# Patient Record
Sex: Female | Born: 1950
Health system: Southern US, Community
[De-identification: ages and names within clinical notes are randomized; demographics above are authoritative.]

## PROBLEM LIST (undated history)

## (undated) DIAGNOSIS — M151 Heberden's nodes (with arthropathy): Secondary | ICD-10-CM

## (undated) DIAGNOSIS — M1990 Unspecified osteoarthritis, unspecified site: Secondary | ICD-10-CM

## (undated) DIAGNOSIS — Z974 Presence of external hearing-aid: Secondary | ICD-10-CM

## (undated) DIAGNOSIS — E785 Hyperlipidemia, unspecified: Secondary | ICD-10-CM

## (undated) DIAGNOSIS — K219 Gastro-esophageal reflux disease without esophagitis: Secondary | ICD-10-CM

## (undated) DIAGNOSIS — H3530 Unspecified macular degeneration: Secondary | ICD-10-CM

## (undated) DIAGNOSIS — R112 Nausea with vomiting, unspecified: Secondary | ICD-10-CM

## (undated) DIAGNOSIS — T7840XA Allergy, unspecified, initial encounter: Secondary | ICD-10-CM

## (undated) DIAGNOSIS — M51369 Other intervertebral disc degeneration, lumbar region without mention of lumbar back pain or lower extremity pain: Secondary | ICD-10-CM

## (undated) DIAGNOSIS — M810 Age-related osteoporosis without current pathological fracture: Secondary | ICD-10-CM

## (undated) DIAGNOSIS — I10 Essential (primary) hypertension: Secondary | ICD-10-CM

## (undated) DIAGNOSIS — Z85828 Personal history of other malignant neoplasm of skin: Secondary | ICD-10-CM

## (undated) DIAGNOSIS — I671 Cerebral aneurysm, nonruptured: Secondary | ICD-10-CM

## (undated) DIAGNOSIS — Z9889 Other specified postprocedural states: Secondary | ICD-10-CM

## (undated) DIAGNOSIS — G510 Bell's palsy: Secondary | ICD-10-CM

## (undated) DIAGNOSIS — K52832 Lymphocytic colitis: Secondary | ICD-10-CM

## (undated) DIAGNOSIS — M5030 Other cervical disc degeneration, unspecified cervical region: Secondary | ICD-10-CM

## (undated) DIAGNOSIS — M479 Spondylosis, unspecified: Secondary | ICD-10-CM

## (undated) DIAGNOSIS — F32A Depression, unspecified: Secondary | ICD-10-CM

## (undated) DIAGNOSIS — J439 Emphysema, unspecified: Secondary | ICD-10-CM

## (undated) DIAGNOSIS — J189 Pneumonia, unspecified organism: Secondary | ICD-10-CM

## (undated) DIAGNOSIS — J449 Chronic obstructive pulmonary disease, unspecified: Secondary | ICD-10-CM

## (undated) DIAGNOSIS — F419 Anxiety disorder, unspecified: Secondary | ICD-10-CM

## (undated) DIAGNOSIS — M5136 Other intervertebral disc degeneration, lumbar region: Secondary | ICD-10-CM

## (undated) HISTORY — DX: Gastro-esophageal reflux disease without esophagitis: K21.9

## (undated) HISTORY — DX: Bell's palsy: G51.0

## (undated) HISTORY — DX: Age-related osteoporosis without current pathological fracture: M81.0

## (undated) HISTORY — DX: Essential (primary) hypertension: I10

## (undated) HISTORY — PX: CEREBRAL ANEURYSM REPAIR: SHX164

## (undated) HISTORY — DX: Depression, unspecified: F32.A

## (undated) HISTORY — DX: Emphysema, unspecified: J43.9

## (undated) HISTORY — PX: EYE SURGERY: SHX253

## (undated) HISTORY — PX: COSMETIC SURGERY: SHX468

## (undated) HISTORY — PX: TUBAL LIGATION: SHX77

## (undated) HISTORY — PX: MASTOIDECTOMY: SHX711

## (undated) HISTORY — PX: WRIST GANGLION EXCISION: SUR520

## (undated) HISTORY — PX: HAND SURGERY: SHX662

## (undated) HISTORY — DX: Other cervical disc degeneration, unspecified cervical region: M50.30

## (undated) HISTORY — DX: Other intervertebral disc degeneration, lumbar region without mention of lumbar back pain or lower extremity pain: M51.369

## (undated) HISTORY — PX: CARPAL TUNNEL RELEASE: SHX101

## (undated) HISTORY — PX: ABDOMINAL HYSTERECTOMY: SHX81

## (undated) HISTORY — DX: Hyperlipidemia, unspecified: E78.5

## (undated) HISTORY — PX: FRACTURE SURGERY: SHX138

## (undated) HISTORY — DX: Allergy, unspecified, initial encounter: T78.40XA

## (undated) HISTORY — DX: Other intervertebral disc degeneration, lumbar region: M51.36

## (undated) HISTORY — PX: SPINE SURGERY: SHX786

## (undated) HISTORY — PX: TRIGGER FINGER RELEASE: SHX641

## (undated) HISTORY — DX: Unspecified osteoarthritis, unspecified site: M19.90

## (undated) HISTORY — PX: FOOT SURGERY: SHX648

## (undated) HISTORY — DX: Cerebral aneurysm, nonruptured: I67.1

## (undated) HISTORY — DX: Anxiety disorder, unspecified: F41.9

---
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
## 1972-12-05 DIAGNOSIS — M952 Other acquired deformity of head: Secondary | ICD-10-CM | POA: Insufficient documentation

## 1972-12-05 DIAGNOSIS — Z8669 Personal history of other diseases of the nervous system and sense organs: Secondary | ICD-10-CM

## 1972-12-05 HISTORY — PX: MASTOIDECTOMY: SHX711

## 1972-12-05 HISTORY — DX: Other acquired deformity of head: M95.2

## 1972-12-05 HISTORY — DX: Personal history of other diseases of the nervous system and sense organs: Z86.69

## 1985-12-05 HISTORY — PX: TOTAL ABDOMINAL HYSTERECTOMY: SHX209

## 1993-12-05 HISTORY — PX: SALPINGOOPHORECTOMY: SHX82

## 2008-12-05 DIAGNOSIS — Z8679 Personal history of other diseases of the circulatory system: Secondary | ICD-10-CM

## 2008-12-05 DIAGNOSIS — Z8619 Personal history of other infectious and parasitic diseases: Secondary | ICD-10-CM

## 2008-12-05 HISTORY — PX: CEREBRAL ANEURYSM REPAIR: SHX164

## 2008-12-05 HISTORY — DX: Personal history of other diseases of the circulatory system: Z86.79

## 2008-12-05 HISTORY — DX: Personal history of other infectious and parasitic diseases: Z86.19

## 2012-12-05 HISTORY — PX: CATARACT EXTRACTION W/ INTRAOCULAR LENS IMPLANT: SHX1309

## 2012-12-05 HISTORY — PX: ROTATOR CUFF REPAIR: SHX139

## 2017-01-17 DIAGNOSIS — Z23 Encounter for immunization: Secondary | ICD-10-CM | POA: Diagnosis not present

## 2017-03-20 ENCOUNTER — Emergency Department (INDEPENDENT_AMBULATORY_CARE_PROVIDER_SITE_OTHER)
Admission: EM | Admit: 2017-03-20 | Discharge: 2017-03-20 | Disposition: A | Discharge status: Home / Self Care | Payer: Medicare Other | Source: Home / Self Care | Attending: Family Medicine | Admitting: Family Medicine

## 2017-03-20 DIAGNOSIS — J029 Acute pharyngitis, unspecified: Secondary | ICD-10-CM

## 2017-03-20 LAB — POCT RAPID STREP A (OFFICE): Rapid Strep A Screen: NEGATIVE

## 2017-03-20 MED ORDER — PENICILLIN V POTASSIUM 500 MG PO TABS: ORAL_TABLET | ORAL | 0 refills | Status: DC

## 2017-03-20 NOTE — ED Provider Notes (Signed)
[name]    CSN: [id number] Arrival date & time: 03/20/17  1121     History   Chief Complaint Chief Complaint  Patient presents with  . Sore Throat  . Otalgia    HPI Krista [name] is a 66 y.o. female.   Patient complains of persistent sore throat for 2.5 days, and notes that her young grandson has strep pharyngitis.  She has been fatigued with myalgias, tenderness over her sinuses, and mild cough.  No fever but has had chills.   The history is provided by the patient.    History reviewed. No pertinent past medical history.  There are no active problems to display for this patient.   Past Surgical History:  Procedure Laterality Date  . ABDOMINAL HYSTERECTOMY    . CEREBRAL ANEURYSM REPAIR    . EYE SURGERY     cararact  . FOOT SURGERY    . HAND SURGERY      OB History    No data available       Home Medications    Prior to Admission medications   Medication Sig Start Date End Date Taking? Authorizing Provider  penicillin v potassium (VEETID) 500 MG tablet Take one tab by mouth twice daily for 10 days (Rx void after 03/27/17) 03/20/17   Kandra Nicolas, MD    Family History Family History  Problem Relation Age of Onset  . Heart failure Mother   . Cancer Father     Social History Social History  Substance Use Topics  . Smoking status: Former Research scientist (life sciences)  . Smokeless tobacco: Not on file  . Alcohol use No     Allergies   Codeine   Review of Systems Review of Systems  + sore throat + cough No pleuritic pain No wheezing + nasal congestion ? post-nasal drainage + sinus pain/pressure No itchy/red eyes ? earache No hemoptysis No SOB No fever, + chills No nausea No vomiting No abdominal pain No diarrhea, but loose stools No urinary symptoms No skin rash + fatigue + myalgias + headache     Physical Exam Triage Vital Signs ED Triage Vitals [03/20/17 1157]  Enc Vitals Group     BP (!) 162/87     Pulse Rate 76     Resp       Temp 98.4 F (36.9 C)     Temp Source Oral     SpO2 95 %     Weight 125 lb (56.7 kg)     Height 5\' 5"  (1.651 m)     Head Circumference      Peak Flow      Pain Score 7     Pain Loc      Pain Edu?      Excl. in [place]?    No data found.   Updated Vital Signs BP (!) 162/87 (BP Location: Left Arm)   Pulse 76   Temp 98.4 F (36.9 C) (Oral)   Ht 5\' 5"  (1.651 m)   Wt 125 lb (56.7 kg)   SpO2 95%   BMI 20.80 kg/m   Visual Acuity Right Eye Distance:   Left Eye Distance:   Bilateral Distance:    Right Eye Near:   Left Eye Near:    Bilateral Near:     Physical Exam Nursing notes and Vital Signs reviewed. Appearance:  Patient appears stated age, and in no acute distress Eyes:  Pupils are equal, round, and reactive to light and accomodation.  Extraocular movement is  intact.  Conjunctivae are not inflamed  Ears:  Canals normal.  Tympanic membranes normal.  Nose:  Mildly congested turbinates.  No sinus tenderness.    Pharynx:  Small aphthous ulcer above uvula. Neck:  Supple.  Tender enlarged posterior/lateral nodes are palpated bilaterally  Lungs:  Clear to auscultation.  Breath sounds are equal.  Moving air well. Heart:  Regular rate and rhythm without murmurs, rubs, or gallops.  Abdomen:  Nontender without masses or hepatosplenomegaly.  Bowel sounds are present.  No CVA or flank tenderness.  Extremities:  No edema.  Skin:  No rash present.    UC Treatments / Results  Labs (all labs ordered are listed, but only abnormal results are displayed) Labs Reviewed  STREP A DNA PROBE negative  POCT RAPID STREP A (OFFICE)    EKG  EKG Interpretation None       Radiology No results found.  Procedures Procedures (including critical care time)  Medications Ordered in UC Medications - No data to display   Initial Impression / Assessment and Plan / UC Course  I have reviewed the triage vital signs and the nursing notes.  Pertinent labs & imaging results that were  available during my care of the patient were reviewed by me and considered in my medical decision making (see chart for details).    There is no evidence of bacterial infection today.   Suspect early viral URI Throat culture pending. As cold symptoms develop, try the following: Take plain guaifenesin (1200mg  extended release tabs such as Mucinex) twice daily, with plenty of water, for cough and congestion.  May add Pseudoephedrine (30mg , one or two every 4 to 6 hours) for sinus congestion.  Get adequate rest.   Also recommend using saline nasal spray several times daily and saline nasal irrigation (AYR is a common brand).    Try warm salt water gargles for sore throat.  May take Delsym Cough Suppressant at bedtime for nighttime cough.  Stop all antihistamines for now, and other non-prescription cough/cold preparations. May take Ibuprofen 200mg , 4 tabs every 8 hours with food for sore throat, body aches, etc. Begin penicillin if throat culture is positive (given Rx to hold). Follow-up with family doctor if not improving about10 days.     Final Clinical Impressions(s) / UC Diagnoses   Final diagnoses:  Acute pharyngitis, unspecified etiology    New Prescriptions New Prescriptions   PENICILLIN V POTASSIUM (VEETID) 500 MG TABLET    Take one tab by mouth twice daily for 10 days (Rx void after 03/27/17)     Kandra Nicolas, MD 04/05/17 [phone]

## 2017-03-20 NOTE — ED Triage Notes (Signed)
Sore throat and chills started Saturday.  Ears are now hurting.  Grandson DX with strep over the weekend.

## 2017-03-20 NOTE — Discharge Instructions (Signed)
As cold symptoms develop, try the following: Take plain guaifenesin (1200mg  extended release tabs such as Mucinex) twice daily, with plenty of water, for cough and congestion.  May add Pseudoephedrine (30mg , one or two every 4 to 6 hours) for sinus congestion.  Get adequate rest.   Also recommend using saline nasal spray several times daily and saline nasal irrigation (AYR is a common brand).    Try warm salt water gargles for sore throat.  May take Delsym Cough Suppressant at bedtime for nighttime cough.  Stop all antihistamines for now, and other non-prescription cough/cold preparations. May take Ibuprofen 200mg , 4 tabs every 8 hours with food for sore throat, body aches, etc. Begin penicillin if throat culture is positive   Follow-up with family doctor if not improving about10 days.

## 2017-03-21 ENCOUNTER — Telehealth: Payer: Self-pay | Admitting: *Deleted

## 2017-03-21 LAB — STREP A DNA PROBE: GASP: NOT DETECTED

## 2017-03-21 NOTE — Telephone Encounter (Signed)
Call back: spoke to pt given negative Tcx results. Charna Archer, LPN

## 2017-04-17 ENCOUNTER — Encounter: Payer: Self-pay | Admitting: Emergency Medicine

## 2017-04-17 ENCOUNTER — Emergency Department (INDEPENDENT_AMBULATORY_CARE_PROVIDER_SITE_OTHER)
Admission: EM | Admit: 2017-04-17 | Discharge: 2017-04-17 | Disposition: A | Discharge status: Home / Self Care | Payer: Medicare Other | Source: Home / Self Care | Attending: Family Medicine | Admitting: Family Medicine

## 2017-04-17 DIAGNOSIS — N3090 Cystitis, unspecified without hematuria: Secondary | ICD-10-CM

## 2017-04-17 DIAGNOSIS — R300 Dysuria: Secondary | ICD-10-CM

## 2017-04-17 LAB — POCT URINALYSIS DIP (MANUAL ENTRY)
Bilirubin, UA: NEGATIVE
Glucose, UA: NEGATIVE mg/dL
Ketones, POC UA: NEGATIVE mg/dL
Nitrite, UA: NEGATIVE
Protein Ur, POC: NEGATIVE mg/dL
Spec Grav, UA: <=1.005 — AB (ref 1.010–1.025)
Urobilinogen, UA: 0.2 E.U./dL
pH, UA: 6.5 (ref 5.0–8.0)

## 2017-04-17 MED ORDER — CEPHALEXIN 500 MG PO CAPS: 500.0000 mg | ORAL_CAPSULE | Freq: Two times a day (BID) | ORAL | 0 refills | Status: DC

## 2017-04-17 NOTE — ED Provider Notes (Signed)
CSN: [id number]     Arrival date & time 04/17/17  1300 History   First MD Initiated Contact with Patient 04/17/17 1325     Chief Complaint  Patient presents with  . Dysuria   (Consider location/radiation/quality/duration/timing/severity/associated sxs/prior Treatment) HPI Krista [name] is a 66 y.o. female presenting to UC with c/o dysuria and polyuria for about 5 days.  Hx of similar symptoms with a UTI. Last UTI was several years ago. Mild lower abdominal soreness and back soreness. Denies fever, chills, n/v/d. She did take Azo earlier today with moderate relief.    History reviewed. No pertinent past medical history. Past Surgical History:  Procedure Laterality Date  . ABDOMINAL HYSTERECTOMY    . CEREBRAL ANEURYSM REPAIR    . EYE SURGERY     cararact  . FOOT SURGERY    . HAND SURGERY     Family History  Problem Relation Age of Onset  . Heart failure Mother   . Cancer Father    Social History  Substance Use Topics  . Smoking status: Former Research scientist (life sciences)  . Smokeless tobacco: Never Used  . Alcohol use No   OB History    No data available     Review of Systems  Constitutional: Negative for chills and fever.  Gastrointestinal: Positive for abdominal pain. Negative for diarrhea, nausea and vomiting.  Genitourinary: Positive for dysuria, frequency and urgency. Negative for pelvic pain.  Musculoskeletal: Positive for back pain. Negative for arthralgias and myalgias.  Skin: Negative for rash.    Allergies  Codeine  Home Medications   Prior to Admission medications   Medication Sig Start Date End Date Taking? Authorizing Provider  cephALEXin (KEFLEX) 500 MG capsule Take 1 capsule (500 mg total) by mouth 2 (two) times daily. 04/17/17   Noland Fordyce, PA-C   Meds Ordered and Administered this Visit  Medications - No data to display  BP (!) 155/81 (BP Location: Left Arm)   Pulse 69   Temp 98.5 F (36.9 C) (Oral)   Ht 5\' 5"  (1.651 m)   Wt 124 lb (56.2 kg)   SpO2 98%    BMI 20.63 kg/m  No data found.   Physical Exam  Constitutional: She is oriented to person, place, and time. She appears well-developed and well-nourished. No distress.  HENT:  Head: Normocephalic and atraumatic.  Mouth/Throat: Oropharynx is clear and moist.  Eyes: EOM are normal.  Neck: Normal range of motion.  Cardiovascular: Normal rate and regular rhythm.   Pulmonary/Chest: Effort normal and breath sounds normal. No respiratory distress. She has no wheezes. She has no rales.  Abdominal: Soft. She exhibits no distension and no mass. There is no tenderness. There is no guarding and no CVA tenderness.  Musculoskeletal: Normal range of motion.  Neurological: She is alert and oriented to person, place, and time.  Skin: Skin is warm and dry. She is not diaphoretic.  Psychiatric: She has a normal mood and affect. Her behavior is normal.  Nursing note and vitals reviewed.   Urgent Care Course     Procedures (including critical care time)  Labs Review Labs Reviewed  POCT URINALYSIS DIP (MANUAL ENTRY) - Abnormal; Notable for the following:       Result Value   Clarity, UA cloudy (*)    Spec Grav, UA <=1.005 (*)    Blood, UA small (*)    Leukocytes, UA Moderate (2+) (*)    All other components within normal limits  URINE CULTURE    Imaging Review No  results found.    MDM   1. Dysuria   2. Cystitis    Hx and exam c/w likely early UTI Will start pt on Keflex while culture is pending  Encouraged good hydration f/u with PCP as needed.    Noland Fordyce, PA-C 04/17/17 [phone]

## 2017-04-17 NOTE — ED Triage Notes (Signed)
Dysuria, polyuria x 5 days

## 2017-04-20 ENCOUNTER — Telehealth: Payer: Self-pay | Admitting: Emergency Medicine

## 2017-04-20 LAB — URINE CULTURE

## 2017-04-20 NOTE — Telephone Encounter (Signed)
Patient states her UTI symptoms are improving; gave her culture results which indicate her rx is appropriate.

## 2017-08-15 ENCOUNTER — Ambulatory Visit (INDEPENDENT_AMBULATORY_CARE_PROVIDER_SITE_OTHER): Payer: Medicare Other | Admitting: Osteopathic Medicine

## 2017-08-15 ENCOUNTER — Encounter: Payer: Self-pay | Admitting: Osteopathic Medicine

## 2017-08-15 VITALS — BP 155/61 | HR 67 | Ht 65.0 in | Wt 129.0 lb

## 2017-08-15 DIAGNOSIS — I671 Cerebral aneurysm, nonruptured: Secondary | ICD-10-CM

## 2017-08-15 DIAGNOSIS — R197 Diarrhea, unspecified: Secondary | ICD-10-CM | POA: Diagnosis not present

## 2017-08-15 DIAGNOSIS — H9193 Unspecified hearing loss, bilateral: Secondary | ICD-10-CM | POA: Insufficient documentation

## 2017-08-15 DIAGNOSIS — Z8669 Personal history of other diseases of the nervous system and sense organs: Secondary | ICD-10-CM

## 2017-08-15 DIAGNOSIS — Z85828 Personal history of other malignant neoplasm of skin: Secondary | ICD-10-CM | POA: Insufficient documentation

## 2017-08-15 DIAGNOSIS — Z1231 Encounter for screening mammogram for malignant neoplasm of breast: Secondary | ICD-10-CM

## 2017-08-15 DIAGNOSIS — Z90710 Acquired absence of both cervix and uterus: Secondary | ICD-10-CM

## 2017-08-15 DIAGNOSIS — Z860100 Personal history of colon polyps, unspecified: Secondary | ICD-10-CM | POA: Insufficient documentation

## 2017-08-15 DIAGNOSIS — Z23 Encounter for immunization: Secondary | ICD-10-CM | POA: Diagnosis not present

## 2017-08-15 DIAGNOSIS — Z8739 Personal history of other diseases of the musculoskeletal system and connective tissue: Secondary | ICD-10-CM

## 2017-08-15 DIAGNOSIS — Z1239 Encounter for other screening for malignant neoplasm of breast: Secondary | ICD-10-CM

## 2017-08-15 DIAGNOSIS — Z0000 Encounter for general adult medical examination without abnormal findings: Secondary | ICD-10-CM | POA: Diagnosis not present

## 2017-08-15 HISTORY — DX: Unspecified hearing loss, bilateral: H91.93

## 2017-08-15 HISTORY — DX: Personal history of other malignant neoplasm of skin: Z85.828

## 2017-08-15 HISTORY — DX: Personal history of other diseases of the nervous system and sense organs: Z86.69

## 2017-08-15 HISTORY — DX: Personal history of colon polyps, unspecified: Z86.0100

## 2017-08-15 HISTORY — DX: Cerebral aneurysm, nonruptured: I67.1

## 2017-08-15 HISTORY — DX: Acquired absence of both cervix and uterus: Z90.710

## 2017-08-15 HISTORY — DX: Personal history of other diseases of the musculoskeletal system and connective tissue: Z87.39

## 2017-08-15 MED ORDER — HYDROCHLOROTHIAZIDE 12.5 MG PO TABS: 12.5000 mg | ORAL_TABLET | Freq: Every day | ORAL | 1 refills | Status: DC

## 2017-08-15 NOTE — Patient Instructions (Signed)
Plan: You should hear about referrals as below Labs fasting at your earliest convenience Starting new blood pressure medicine Bone density test and mammogram here

## 2017-08-15 NOTE — Progress Notes (Signed)
HPI: Krista [name] is a 66 y.o. female  who presents to [facility] today, 08/15/17,  for chief complaint of:  Chief Complaint  Patient presents with  . Establish Care    annual    Pleasant new patient here to establish care, no complaints.  History reviewed as below.   Needs caught up on preventive care/annual checkup.   C/o occasional diarrhea, would like referral to GI  Hx basal cell skin Ca, follows annually with dermatologist for skin checks   Would like referral for audiology to discuss hearing loss/hearing aids     Past medical, surgical, social and family history reviewed: Patient Active Problem List   Diagnosis Date Noted  . History of Bell's palsy 08/15/2017  . History of osteoporosis 08/15/2017  . History of colon polyps 08/15/2017  . Diarrhea 08/15/2017  . History of nonmelanoma skin cancer 08/15/2017  . Brain aneurysm 08/15/2017  . Bilateral hearing loss 08/15/2017  . H/O hysterectomy for benign disease 08/15/2017   Past Surgical History:  Procedure Laterality Date  . ABDOMINAL HYSTERECTOMY    . CEREBRAL ANEURYSM REPAIR    . EYE SURGERY     cararact  . FOOT SURGERY    . HAND SURGERY     Social History  Substance Use Topics  . Smoking status: Former Research scientist (life sciences)  . Smokeless tobacco: Never Used  . Alcohol use No   Family History  Problem Relation Age of Onset  . Heart failure Mother   . Cancer Father      Current medication list and allergy/intolerance information reviewed:   Current Outpatient Prescriptions  Medication Sig Dispense Refill  . Magnesium 500 MG CAPS Take 500 mg by mouth daily.    . Melatonin 10 MG TABS Take 10 mg by mouth. TAKE 2 TABLETS DAILY    . Potassium 95 MG TABS Take 99 mg by mouth daily.    . hydrochlorothiazide (HYDRODIURIL) 12.5 MG tablet Take 1 tablet (12.5 mg total) by mouth daily. 30 tablet 1   No current facility-administered medications for this visit.    Allergies  Allergen  Reactions  . Codeine       Review of Systems:  Constitutional:  No  fever, no chills, No recent illness, No unintentional weight changes. No significant fatigue.   HEENT: No  headache, no vision change, no hearing change, No sore throat, No  sinus pressure  Cardiac: No  chest pain, No  pressure, No palpitations, No  Orthopnea  Respiratory:  No  shortness of breath. No  Cough  Gastrointestinal: No  abdominal pain, No  nausea, No  vomiting,  No  blood in stool, +loose stool and occasional diarrhea, No  constipation   Musculoskeletal: No new myalgia/arthralgia  Genitourinary: No  incontinence, No  abnormal genital bleeding, No abnormal genital discharge  Skin: No  Rash, No other wounds/concerning lesions  Hem/Onc: No  easy bruising/bleeding, No  abnormal lymph node  Endocrine: No cold intolerance,  No heat intolerance. No polyuria/polydipsia/polyphagia   Neurologic: No  weakness, No  dizziness, No  slurred speech/focal weakness/facial droop  Psychiatric: No  concerns with depression, No  concerns with anxiety, No sleep problems, No mood problems  Exam:  BP (!) 155/61   Pulse 67   Ht 5\' 5"  (1.651 m)   Wt 129 lb (58.5 kg)   BMI 21.47 kg/m   Constitutional: VS see above. General Appearance: alert, well-developed, well-nourished, NAD  Eyes: Normal lids and conjunctive, non-icteric sclera  Ears, Nose,  Mouth, Throat: MMM, Normal external inspection ears/nares/mouth/lips/gums. TM normal bilaterally. Pharynx/tonsils no erythema, no exudate. Nasal mucosa normal.   Neck: No masses, trachea midline. No thyroid enlargement. No tenderness/mass appreciated. No lymphadenopathy  Respiratory: Normal respiratory effort. no wheeze, no rhonchi, no rales  Cardiovascular: S1/S2 normal, no murmur, no rub/gallop auscultated. RRR. No lower extremity edema.   Gastrointestinal: Nontender, no masses. No hepatomegaly, no splenomegaly. No hernia appreciated. Bowel sounds normal. Rectal exam  deferred.   Musculoskeletal: Gait normal. No clubbing/cyanosis of digits.   Neurological: Normal balance/coordination. No tremor. No cranial nerve deficit on limited exam.  Skin: warm, dry, intact. No rash/ulcer.   Psychiatric: Normal judgment/insight. Normal mood and affect. Oriented x3.    ASSESSMENT/PLAN:   Annual physical exam - Plan: CBC, COMPLETE METABOLIC PANEL WITH GFR, Lipid panel, TSH  Need for immunization against influenza - Plan: Flu vaccine HIGH DOSE PF  Screening for breast cancer - Plan: MM DIGITAL SCREENING BILATERAL  History of osteoporosis - Plan: DG Bone Density, VITAMIN D 25 Hydroxy (Vit-D Deficiency, Fractures)  History of Bell's palsy  History of nonmelanoma skin cancer - Plan: Ambulatory referral to Dermatology  Diarrhea, unspecified type - Plan: Ambulatory referral to Gastroenterology  History of colon polyps - Plan: Ambulatory referral to Gastroenterology  Bilateral hearing loss, unspecified hearing loss type - Plan: Ambulatory referral to Audiology  Brain aneurysm - s/p repair  H/O hysterectomy for benign disease    Patient Instructions  Plan: You should hear about referrals as below Labs fasting at your earliest convenience Starting new blood pressure medicine Bone density test and mammogram here    Will request records from previous PCP  Visit summary with medication list and pertinent instructions was printed for patient to review. All questions at time of visit were answered - patient instructed to contact office with any additional concerns. ER/RTC precautions were reviewed with the patient. Follow-up plan: Return for recheck blood pressure in 2-4 weeks .

## 2017-08-17 DIAGNOSIS — Z8739 Personal history of other diseases of the musculoskeletal system and connective tissue: Secondary | ICD-10-CM | POA: Diagnosis not present

## 2017-08-17 DIAGNOSIS — Z0000 Encounter for general adult medical examination without abnormal findings: Secondary | ICD-10-CM | POA: Diagnosis not present

## 2017-08-18 LAB — LIPID PANEL
Cholesterol: 270 mg/dL — ABNORMAL HIGH (ref <200)
HDL: 66 mg/dL (ref >50)
LDL Cholesterol (Calc): 167 mg/dL (calc) — ABNORMAL HIGH
Non-HDL Cholesterol (Calc): 204 mg/dL (calc) — ABNORMAL HIGH (ref <130)
Total CHOL/HDL Ratio: 4.1 (calc) (ref <5.0)
Triglycerides: 207 mg/dL — ABNORMAL HIGH (ref <150)

## 2017-08-18 LAB — COMPLETE METABOLIC PANEL WITH GFR
AG Ratio: 2 (calc) (ref 1.0–2.5)
ALT: 21 U/L (ref 6–29)
AST: 21 U/L (ref 10–35)
Albumin: 4.7 g/dL (ref 3.6–5.1)
Alkaline phosphatase (APISO): 71 U/L (ref 33–130)
BUN: 10 mg/dL (ref 7–25)
CO2: 29 mmol/L (ref 20–32)
Calcium: 9.9 mg/dL (ref 8.6–10.4)
Chloride: 100 mmol/L (ref 98–110)
Creat: 0.76 mg/dL (ref 0.50–0.99)
GFR, Est African American: 95 mL/min/{1.73_m2} (ref >=60)
GFR, Est Non African American: 82 mL/min/{1.73_m2} (ref >=60)
Globulin: 2.3 g/dL (calc) (ref 1.9–3.7)
Glucose, Bld: 96 mg/dL (ref 65–99)
Potassium: 4 mmol/L (ref 3.5–5.3)
Sodium: 138 mmol/L (ref 135–146)
Total Bilirubin: 0.4 mg/dL (ref 0.2–1.2)
Total Protein: 7 g/dL (ref 6.1–8.1)

## 2017-08-18 LAB — CBC
HCT: 37.7 % (ref 35.0–45.0)
Hemoglobin: 12.6 g/dL (ref 11.7–15.5)
MCH: 30.8 pg (ref 27.0–33.0)
MCHC: 33.4 g/dL (ref 32.0–36.0)
MCV: 92.2 fL (ref 80.0–100.0)
MPV: 8.9 fL (ref 7.5–12.5)
Platelets: 266 10*3/uL (ref 140–400)
RBC: 4.09 10*6/uL (ref 3.80–5.10)
RDW: 12.4 % (ref 11.0–15.0)
WBC: 6.1 10*3/uL (ref 3.8–10.8)

## 2017-08-18 LAB — VITAMIN D 25 HYDROXY (VIT D DEFICIENCY, FRACTURES): Vit D, 25-Hydroxy: 63 ng/mL (ref 30–100)

## 2017-08-18 LAB — TSH: TSH: 0.71 mIU/L (ref 0.40–4.50)

## 2017-08-24 DIAGNOSIS — Z961 Presence of intraocular lens: Secondary | ICD-10-CM | POA: Diagnosis not present

## 2017-08-24 DIAGNOSIS — H26491 Other secondary cataract, right eye: Secondary | ICD-10-CM | POA: Diagnosis not present

## 2017-08-30 ENCOUNTER — Ambulatory Visit (INDEPENDENT_AMBULATORY_CARE_PROVIDER_SITE_OTHER): Payer: Medicare Other

## 2017-08-30 DIAGNOSIS — Z1239 Encounter for other screening for malignant neoplasm of breast: Secondary | ICD-10-CM

## 2017-08-30 DIAGNOSIS — Z1231 Encounter for screening mammogram for malignant neoplasm of breast: Secondary | ICD-10-CM

## 2017-09-05 ENCOUNTER — Ambulatory Visit (INDEPENDENT_AMBULATORY_CARE_PROVIDER_SITE_OTHER): Payer: Medicare Other | Admitting: Osteopathic Medicine

## 2017-09-05 ENCOUNTER — Encounter: Payer: Self-pay | Admitting: Osteopathic Medicine

## 2017-09-05 VITALS — BP 128/83 | HR 91 | Ht 65.0 in | Wt 130.0 lb

## 2017-09-05 DIAGNOSIS — Z7189 Other specified counseling: Secondary | ICD-10-CM | POA: Diagnosis not present

## 2017-09-05 DIAGNOSIS — E782 Mixed hyperlipidemia: Secondary | ICD-10-CM

## 2017-09-05 DIAGNOSIS — R05 Cough: Secondary | ICD-10-CM

## 2017-09-05 DIAGNOSIS — I10 Essential (primary) hypertension: Secondary | ICD-10-CM | POA: Diagnosis not present

## 2017-09-05 DIAGNOSIS — R059 Cough, unspecified: Secondary | ICD-10-CM

## 2017-09-05 HISTORY — DX: Mixed hyperlipidemia: E78.2

## 2017-09-05 HISTORY — DX: Other specified counseling: Z71.89

## 2017-09-05 HISTORY — DX: Essential (primary) hypertension: I10

## 2017-09-05 LAB — BASIC METABOLIC PANEL WITH GFR
BUN: 15 mg/dL (ref 7–25)
CO2: 29 mmol/L (ref 20–32)
Calcium: 9.9 mg/dL (ref 8.6–10.4)
Chloride: 103 mmol/L (ref 98–110)
Creat: 0.72 mg/dL (ref 0.50–0.99)
GFR, Est African American: 101 mL/min/{1.73_m2} (ref >=60)
GFR, Est Non African American: 87 mL/min/{1.73_m2} (ref >=60)
Glucose, Bld: 85 mg/dL (ref 65–99)
Potassium: 4.2 mmol/L (ref 3.5–5.3)
Sodium: 140 mmol/L (ref 135–146)

## 2017-09-05 MED ORDER — PRAVASTATIN SODIUM 20 MG PO TABS: 20.0000 mg | ORAL_TABLET | Freq: Every day | ORAL | 1 refills | Status: DC

## 2017-09-05 MED ORDER — BENZONATATE 200 MG PO CAPS: 200.0000 mg | ORAL_CAPSULE | Freq: Three times a day (TID) | ORAL | 0 refills | Status: DC | PRN

## 2017-09-05 NOTE — Progress Notes (Signed)
HPI: Krista [name] is a 66 y.o. female  who presents to [facility] today, 09/05/17,  for chief complaint of:  Chief Complaint  Patient presents with  . Follow-up    BLOOD PRESSURE    History to follow-up on blood pressure, we started low-dose HCTZ at last visit. She is tolerating the medication well. Recently replaced her home blood pressure cuff, numbers at home are still reading on the high side, cuff has not been verified in the office. No chest pain, pressure, shortness of breath.  Discussed cholesterol results today. Patient states she was previously on Questran medication, she cannot recall which one her why it was discontinued] previous PCP. Has some arthritis issues but no muscle or joint pain that she can really connect to the medications. She does not recall liver enzymes being abnormal.  Cough ongoing for a few days, dry, some sore throat. No fever/chills.    Past medical history, surgical history, social history and family history reviewed.  Patient Active Problem List   Diagnosis Date Noted  . History of Bell's palsy 08/15/2017  . History of osteoporosis 08/15/2017  . History of colon polyps 08/15/2017  . Diarrhea 08/15/2017  . History of nonmelanoma skin cancer 08/15/2017  . Brain aneurysm 08/15/2017  . Bilateral hearing loss 08/15/2017  . H/O hysterectomy for benign disease 08/15/2017    Current medication list and allergy/intolerance information reviewed.   Current Outpatient Prescriptions on File Prior to Visit  Medication Sig Dispense Refill  . acetaminophen (TYLENOL) 500 MG tablet Take 500 mg by mouth every 6 (six) hours as needed.    . Calcium-Magnesium-Vitamin D 109-32-355 MG-MG-UNIT TB24 Take by mouth daily.    . Cholecalciferol (VITAMIN D3) 5000 units CAPS Take 5,000 Units by mouth daily.    . hydrochlorothiazide (HYDRODIURIL) 12.5 MG tablet Take 1 tablet (12.5 mg total) by mouth daily. 30 tablet 1  . Magnesium 500 MG  CAPS Take 500 mg by mouth daily.    . Melatonin 10 MG TABS Take 10 mg by mouth. TAKE 2 TABLETS DAILY    . Potassium 95 MG TABS Take 99 mg by mouth daily.    . Zinc 50 MG CAPS Take 50 mg by mouth daily.     No current facility-administered medications on file prior to visit.    Allergies  Allergen Reactions  . Codeine       Review of Systems:  Constitutional: No recent illness  HEENT: No  headache, no vision change  Cardiac: No  chest pain, No  pressure, No palpitations  Respiratory:  No  shortness of breath. +Cough  Gastrointestinal: No  abdominal pain, no change on bowel habits  Musculoskeletal: No new myalgia/arthralgia  Skin: No  Rash  Hem/Onc: No  easy bruising/bleeding, No  abnormal lumps/bumps  Neurologic: No  weakness, No  Dizziness   Exam:  BP 128/83   Pulse 91   Ht 5\' 5"  (1.651 m)   Wt 130 lb (59 kg)   BMI 21.63 kg/m  blood pressure confirmed on manual recheck  Constitutional: VS see above. General Appearance: alert, well-developed, well-nourished, NAD  Eyes: Normal lids and conjunctive, non-icteric sclera  Ears, Nose, Mouth, Throat: MMM, Normal external inspection ears/nares/mouth/lips/gums.  Neck: No masses, trachea midline.   Respiratory: Normal respiratory effort. no wheeze, no rhonchi, no rales  Cardiovascular: S1/S2 normal, no murmur, no rub/gallop auscultated. RRR.   Musculoskeletal: Gait normal. Symmetric and independent movement of all extremities  Neurological: Normal balance/coordination. No tremor.  Skin: warm, dry, intact.   Psychiatric: Normal judgment/insight. Normal mood and affect. Oriented x3.    Recent Results (from the past 2160 hour(s))  CBC     Status: None   Collection Time: 08/17/17 11:43 AM  Result Value Ref Range   WBC 6.1 3.8 - 10.8 Thousand/uL   RBC 4.09 3.80 - 5.10 Million/uL   Hemoglobin 12.6 11.7 - 15.5 g/dL   HCT 37.7 35.0 - 45.0 %   MCV 92.2 80.0 - 100.0 fL   MCH 30.8 27.0 - 33.0 pg   MCHC 33.4 32.0 -  36.0 g/dL   RDW 12.4 11.0 - 15.0 %   Platelets 266 140 - 400 Thousand/uL   MPV 8.9 7.5 - 12.5 fL  COMPLETE METABOLIC PANEL WITH GFR     Status: None   Collection Time: 08/17/17 11:43 AM  Result Value Ref Range   Glucose, Bld 96 65 - 99 mg/dL    Comment: .            Fasting reference interval .    BUN 10 7 - 25 mg/dL   Creat 0.76 0.50 - 0.99 mg/dL    Comment: For patients >68 years of age, the reference limit for Creatinine is approximately 13% higher for people identified as African-American. .    GFR, Est Non African American 82 > OR = 60 mL/min/1.73m2   GFR, Est African American 95 > OR = 60 mL/min/1.80m2   BUN/Creatinine Ratio NOT APPLICABLE 6 - 22 (calc)   Sodium 138 135 - 146 mmol/L   Potassium 4.0 3.5 - 5.3 mmol/L   Chloride 100 98 - 110 mmol/L   CO2 29 20 - 32 mmol/L   Calcium 9.9 8.6 - 10.4 mg/dL   Total Protein 7.0 6.1 - 8.1 g/dL   Albumin 4.7 3.6 - 5.1 g/dL   Globulin 2.3 1.9 - 3.7 g/dL (calc)   AG Ratio 2.0 1.0 - 2.5 (calc)   Total Bilirubin 0.4 0.2 - 1.2 mg/dL   Alkaline phosphatase (APISO) 71 33 - 130 U/L   AST 21 10 - 35 U/L   ALT 21 6 - 29 U/L  Lipid panel     Status: Abnormal   Collection Time: 08/17/17 11:43 AM  Result Value Ref Range   Cholesterol 270 (H) <200 mg/dL   HDL 66 >50 mg/dL   Triglycerides 207 (H) <150 mg/dL   LDL Cholesterol (Calc) 167 (H) mg/dL (calc)    Comment: Reference range: <100 . Desirable range <100 mg/dL for primary prevention;   <70 mg/dL for patients with CHD or diabetic patients  with > or = 2 CHD risk factors. Marland Kitchen LDL-C is now calculated using the Martin-Hopkins  calculation, which is a validated novel method providing  better accuracy than the Friedewald equation in the  estimation of LDL-C.  Cresenciano Genre et al. Annamaria Helling. 0626;948(54): 2061-2068  (http://education.[facility].com/faq/FAQ164)    Total CHOL/HDL Ratio 4.1 <5.0 (calc)   Non-HDL Cholesterol (Calc) 204 (H) <130 mg/dL (calc)    Comment: For patients with  diabetes plus 1 major ASCVD risk  factor, treating to a non-HDL-C goal of <100 mg/dL  (LDL-C of <70 mg/dL) is considered a therapeutic  option.   TSH     Status: None   Collection Time: 08/17/17 11:43 AM  Result Value Ref Range   TSH 0.71 0.40 - 4.50 mIU/L  VITAMIN D 25 Hydroxy (Vit-D Deficiency, Fractures)     Status: None   Collection Time: 08/17/17 11:43 AM  Result Value Ref  Range   Vit D, 25-Hydroxy 63 30 - 100 ng/mL    Comment: Vitamin D Status         25-OH Vitamin D: . Deficiency:                    <20 ng/mL Insufficiency:             20 - 29 ng/mL Optimal:                 > or = 30 ng/mL . For 25-OH Vitamin D testing on patients on  D2-supplementation and patients for whom quantitation  of D2 and D3 fractions is required, the QuestAssureD(TM) 25-OH VIT D, (D2,D3), LC/MS/MS is recommended: order  code [phone] (patients >19yrs). . For more information on this test, go to: http://education.[facility].com/faq/FAQ163 (This link is being provided for  informational/educational purposes only.)     No results found.  No flowsheet data found.  Depression screen PHQ 2/9 08/15/2017  Decreased Interest 0  Down, Depressed, Hopeless 0  PHQ - 2 Score 0      ASSESSMENT/PLAN:   Essential hypertension - Plan: BASIC METABOLIC PANEL WITH GFR  Mixed hyperlipidemia - Plan: pravastatin (PRAVACHOL) 20 MG tablet, Lipid panel, COMPLETE METABOLIC PANEL WITH GFR  Cough - Plan: benzonatate (TESSALON) 200 MG capsule  Cardiac risk counseling - ASCVD 10 year risk puts her in statin benefit group. She is okay to try low-dose, low potency medication.    Patient Instructions  Plan:  Will start Pravastain cholesterol medication  When running low on this medicine in 90 days, plan to come to the lab for recheck cholesterol levels (see attached lab order)   Continue blood pressure medication   If you'd like [place] to check your home blood pressure cuff, please call the office and we  can arrange for a nurse to be available to check this   I also sent some cough medicine. If cough persists or gets worse, please schedule a checkup for Xray     Follow-up plan: Return in about 3 months (around 12/06/2017) for lab visit for recheck cholesterol, and see Dr. Sheppard Coil in 6 months to recheck BP .  Visit summary with medication list and pertinent instructions was printed for patient to review, alert [place] if any changes needed. All questions at time of visit were answered - patient instructed to contact office with any additional concerns. ER/RTC precautions were reviewed with the patient and understanding verbalized.   Note: Total time spent 25 minutes, greater than 50% of the visit was spent face-to-face counseling and coordinating care for the following: The primary encounter diagnosis was Essential hypertension. Diagnoses of Mixed hyperlipidemia, Cough, and Cardiac risk counseling were also pertinent to this visit.Marland Kitchen

## 2017-09-05 NOTE — Patient Instructions (Addendum)
Plan:  Will start Pravastain cholesterol medication  When running low on this medicine in 90 days, plan to come to the lab for recheck cholesterol levels (see attached lab order)   Continue blood pressure medication   If you'd like [place] to check your home blood pressure cuff, please call the office and we can arrange for a nurse to be available to check this   I also sent some cough medicine. If cough persists or gets worse, please schedule a checkup for Xray

## 2017-09-06 DIAGNOSIS — R159 Full incontinence of feces: Secondary | ICD-10-CM | POA: Diagnosis not present

## 2017-09-06 DIAGNOSIS — R197 Diarrhea, unspecified: Secondary | ICD-10-CM | POA: Diagnosis not present

## 2017-09-06 DIAGNOSIS — Z86010 Personal history of colonic polyps: Secondary | ICD-10-CM | POA: Diagnosis not present

## 2017-09-13 DIAGNOSIS — R197 Diarrhea, unspecified: Secondary | ICD-10-CM | POA: Diagnosis not present

## 2017-09-13 DIAGNOSIS — K52832 Lymphocytic colitis: Secondary | ICD-10-CM | POA: Diagnosis not present

## 2017-09-13 DIAGNOSIS — K635 Polyp of colon: Secondary | ICD-10-CM | POA: Diagnosis not present

## 2017-09-13 HISTORY — PX: COLONOSCOPY: SHX5424

## 2017-09-13 LAB — HM COLONOSCOPY

## 2017-09-18 DIAGNOSIS — I10 Essential (primary) hypertension: Secondary | ICD-10-CM | POA: Diagnosis not present

## 2017-09-18 DIAGNOSIS — I671 Cerebral aneurysm, nonruptured: Secondary | ICD-10-CM | POA: Diagnosis not present

## 2017-10-07 ENCOUNTER — Other Ambulatory Visit: Payer: Self-pay | Admitting: Osteopathic Medicine

## 2017-10-20 ENCOUNTER — Encounter: Payer: Self-pay | Admitting: Osteopathic Medicine

## 2017-10-20 ENCOUNTER — Ambulatory Visit (INDEPENDENT_AMBULATORY_CARE_PROVIDER_SITE_OTHER): Payer: Medicare Other | Admitting: Osteopathic Medicine

## 2017-10-20 VITALS — BP 132/81 | HR 108 | Temp 98.6°F | Ht 65.0 in | Wt 126.0 lb

## 2017-10-20 DIAGNOSIS — B9789 Other viral agents as the cause of diseases classified elsewhere: Secondary | ICD-10-CM | POA: Diagnosis not present

## 2017-10-20 DIAGNOSIS — J069 Acute upper respiratory infection, unspecified: Secondary | ICD-10-CM

## 2017-10-20 DIAGNOSIS — H6122 Impacted cerumen, left ear: Secondary | ICD-10-CM | POA: Diagnosis not present

## 2017-10-20 DIAGNOSIS — J019 Acute sinusitis, unspecified: Secondary | ICD-10-CM

## 2017-10-20 MED ORDER — AMOXICILLIN-POT CLAVULANATE 875-125 MG PO TABS: 1.0000 | ORAL_TABLET | Freq: Two times a day (BID) | ORAL | 0 refills | Status: DC

## 2017-10-20 MED ORDER — BENZONATATE 200 MG PO CAPS: 200.0000 mg | ORAL_CAPSULE | Freq: Three times a day (TID) | ORAL | 0 refills | Status: DC | PRN

## 2017-10-20 MED ORDER — IPRATROPIUM BROMIDE 0.06 % NA SOLN: 2.0000 | Freq: Four times a day (QID) | NASAL | 1 refills | Status: DC

## 2017-10-20 NOTE — Progress Notes (Signed)
HPI: Krista [name] is a 66 y.o. female who  has a past medical history of Arthritis, Bell palsy, Brain aneurysm, Hyperlipidemia, Hypertension, and Osteoporosis.  she presents to [facility] today, 10/20/17,  for chief complaint of:  Chief Complaint  Patient presents with  . Cough    Cough with fever starting about 6 days ago. Fever has gotten a bit better now just down to low-grade temperature. Sinus congestion and left-sided ear pressure have been bothering her. Over-the-counter Mucinex not really helping   Past medical, surgical, social and family history reviewed:  Patient Active Problem List   Diagnosis Date Noted  . Cardiac risk counseling 09/05/2017  . Essential hypertension 09/05/2017  . Mixed hyperlipidemia 09/05/2017  . History of Bell's palsy 08/15/2017  . History of osteoporosis 08/15/2017  . History of colon polyps 08/15/2017  . Diarrhea 08/15/2017  . History of nonmelanoma skin cancer 08/15/2017  . Brain aneurysm 08/15/2017  . Bilateral hearing loss 08/15/2017  . H/O hysterectomy for benign disease 08/15/2017    Past Surgical History:  Procedure Laterality Date  . ABDOMINAL HYSTERECTOMY    . CEREBRAL ANEURYSM REPAIR    . EYE SURGERY     cararact  . FOOT SURGERY    . HAND SURGERY      Social History   Tobacco Use  . Smoking status: Former Research scientist (life sciences)  . Smokeless tobacco: Never Used  Substance Use Topics  . Alcohol use: No    Family History  Problem Relation Age of Onset  . Heart failure Mother   . Cancer Father      Current medication list and allergy/intolerance information reviewed:    Current Outpatient Medications  Medication Sig Dispense Refill  . budesonide (ENTOCORT EC) 3 MG 24 hr capsule Take 5 mg 2 (two) times daily by mouth.    . Calcium-Magnesium-Vitamin D 017-51-025 MG-MG-UNIT TB24 Take by mouth daily.    . Cholecalciferol (VITAMIN D3) 5000 units CAPS Take 5,000 Units by mouth daily.    .  hydrochlorothiazide (HYDRODIURIL) 12.5 MG tablet TAKE 1 TABLET BY MOUTH EVERY DAY 30 tablet 1  . Melatonin 10 MG TABS Take 10 mg by mouth. TAKE 2 TABLETS DAILY    . Potassium 95 MG TABS Take 99 mg by mouth daily.    . pravastatin (PRAVACHOL) 20 MG tablet Take 1 tablet (20 mg total) by mouth daily. 90 tablet 1   No current facility-administered medications for this visit.     Allergies  Allergen Reactions  . Codeine       Review of Systems:  Constitutional:  +fever, no chills, +recent illness, No unintentional weight changes. No significant fatigue.   HEENT: No  headache, no vision change, no hearing change, +sore throat, +sinus pressure, +ear pressure on L  Cardiac: No  chest pain, No  pressure  Respiratory:  No  shortness of breath. + Cough  Gastrointestinal: No  abdominal pain, No  nausea, No  vomiting  Musculoskeletal: No new myalgia/arthralgia  Skin: No  Rash, No other wounds/concerning lesions  Neurologic: No  weakness, No  dizzines   Exam:  BP 132/81   Pulse (!) 108   Temp 98.6 F (37 C) (Oral)   Ht 5\' 5"  (1.651 m)   Wt 126 lb (57.2 kg)   BMI 20.97 kg/m   Constitutional: VS see above. General Appearance: alert, well-developed, well-nourished, NAD  Eyes: Normal lids and conjunctive, non-icteric sclera  Ears, Nose, Mouth, Throat: MMM, Normal external inspection ears/nares/mouth/lips/gums. TM normal  on R, obscured by cerumen on L. Pharynx/tonsils +erythema, no exudate. Nasal mucosa normal.   Neck: No masses, trachea midline. No thyroid enlargement. No tenderness/mass appreciated. No lymphadenopathy  Respiratory: Normal respiratory effort. no wheeze, no rhonchi, no rales  Cardiovascular: S1/S2 normal, no murmur, no rub/gallop auscultated. RRR. No lower extremity edema.   Skin: warm, dry, intact. No rash/ulcer  Psychiatric: Normal judgment/insight. Normal mood and affect. Oriented x3.    Indication: Cerumen impaction of the ear(s) Medical necessity  statement: On physical examination, cerumen impairs clinically significant portions of the external auditory canal, and tympanic membrane. Noted obstructive, copious cerumen that cannot be removed without magnification and instrumentations requiring physician skills Consent: Discussed benefits and risks of procedure and verbal consent obtained Procedure: Patient was prepped for the procedure. Utilized an otoscope to assess and take note of the ear canal, the tympanic membrane, and the presence, amount, and placement of the cerumen. Gentle water irrigation and soft plastic curette was utilized to remove some cerumen. Procedure had to be aborted due to patient intolerance/pain Post procedure examination: shows cerumen was incompletely removed. Patient unable to tolerate procedure to successful completion. The patient is made aware that they may experience temporary vertigo, temporary hearing loss, and temporary discomfort. If these symptom last for more than 24 hours to call the clinic or proceed to the ED.   ASSESSMENT/PLAN: Lungs are clear, I don't see utility at this point in chest x-ray, would consider if worse/persist. Patient okay with this plan. Trial conservative treatment but go ahead and fill antibiotics if not better by tomorrow, especially since I wasn't able to fully visualize the tympanic membrane on the left. She reports a dull pressure type feeling more so than a sharp peeling consistent with bacterial otitis media but without complete exam, of course I'm unable to adequately determine this  Viral URI with cough - Plan: benzonatate (TESSALON) 200 MG capsule, ipratropium (ATROVENT) 0.06 % nasal spray  Impacted cerumen of left ear  Acute non-recurrent sinusitis, unspecified location - Plan: amoxicillin-clavulanate (AUGMENTIN) 875-125 MG tablet    Patient Instructions  Over-the-Counter Medications & Home Remedies for Upper Respiratory Illness  Note: the following list assumes no  pregnancy, normal liver & kidney function and no other drug interactions. Dr. Sheppard Coil has highlighted medications which are safe for you to use, but these may not be appropriate for everyone. Always ask a pharmacist or qualified medical provider if you have any questions!   Aches/Pains, Fever, Headache Acetaminophen (Tylenol) 500 mg tablets - take max 2 tablets (1000 mg) every 6 hours (4 times per day)  Ibuprofen (Motrin) 200 mg tablets - take max 4 tablets (800 mg) every 6 hours*  Sinus Congestion Prescription Atrovent as directed Nasal Saline if desired Phenylephrine (Sudafed) 10 mg tablets every 4 hours (or the 12-hour formulation)* Diphenhydramine (Benadryl) 25 mg tablets - take max 2 tablets every 4 hours  Cough & Sore Throat Prescription cough pills or syrups as directed Dextromethorphan (Robitussin, others) - cough suppressant Guaifenesin (Robitussin, Mucinex, others) - expectorant (helps cough up mucus) (Dextromethorphan and Guaifenesin also come in a combination tablet) Lozenges w/ Benzocaine + Menthol (Cepacol) Honey - as much as you want! Teas which "coat the throat" - look for ingredients Elm Bark, Licorice Root, Marshmallow Root  Other Antibiotics if these are prescribed - take ALL, even if you're feeling better  Zinc Lozenges within 24 hours of symptoms onset - mixed evidence this shortens the duration of the common cold Don't waste your money on Vitamin  C or Echinacea  *Caution in patients with high blood pressure       Visit summary with medication list and pertinent instructions was printed for patient to review. All questions at time of visit were answered - patient instructed to contact office with any additional concerns. ER/RTC precautions were reviewed with the patient. Follow-up plan: Return if symptoms worsen or fail to improve.  Note: Total time spent 25 minutes, greater than 50% of the visit was spent face-to-face counseling and coordinating care for the  following: The primary encounter diagnosis was Viral URI with cough. Diagnoses of Impacted cerumen of left ear and Acute non-recurrent sinusitis, unspecified location were also pertinent to this visit.Marland Kitchen  Please note: voice recognition software was used to produce this document, and typos may escape review. Please contact Dr. Sheppard Coil for any needed clarifications.

## 2017-10-20 NOTE — Patient Instructions (Addendum)
Over-the-Counter Medications & Home Remedies for Upper Respiratory Illness  Note: the following list assumes no pregnancy, normal liver & kidney function and no other drug interactions. Dr. Sheppard Coil has highlighted medications which are safe for you to use, but these may not be appropriate for everyone. Always ask a pharmacist or qualified medical provider if you have any questions!   Aches/Pains, Fever, Headache Acetaminophen (Tylenol) 500 mg tablets - take max 2 tablets (1000 mg) every 6 hours (4 times per day)  Ibuprofen (Motrin) 200 mg tablets - take max 4 tablets (800 mg) every 6 hours*  Sinus Congestion Prescription Atrovent as directed Nasal Saline if desired Phenylephrine (Sudafed) 10 mg tablets every 4 hours (or the 12-hour formulation)* Diphenhydramine (Benadryl) 25 mg tablets - take max 2 tablets every 4 hours  Cough & Sore Throat Prescription cough pills or syrups as directed Dextromethorphan (Robitussin, others) - cough suppressant Guaifenesin (Robitussin, Mucinex, others) - expectorant (helps cough up mucus) (Dextromethorphan and Guaifenesin also come in a combination tablet) Lozenges w/ Benzocaine + Menthol (Cepacol) Honey - as much as you want! Teas which "coat the throat" - look for ingredients Elm Bark, Licorice Root, Marshmallow Root  Other Antibiotics if these are prescribed - take ALL, even if you're feeling better  Zinc Lozenges within 24 hours of symptoms onset - mixed evidence this shortens the duration of the common cold Don't waste your money on Vitamin C or Echinacea  *Caution in patients with high blood pressure

## 2017-11-02 DIAGNOSIS — Z85828 Personal history of other malignant neoplasm of skin: Secondary | ICD-10-CM | POA: Diagnosis not present

## 2017-11-02 DIAGNOSIS — L821 Other seborrheic keratosis: Secondary | ICD-10-CM | POA: Diagnosis not present

## 2017-11-02 DIAGNOSIS — D235 Other benign neoplasm of skin of trunk: Secondary | ICD-10-CM | POA: Diagnosis not present

## 2017-11-02 DIAGNOSIS — Z08 Encounter for follow-up examination after completed treatment for malignant neoplasm: Secondary | ICD-10-CM | POA: Diagnosis not present

## 2017-11-03 ENCOUNTER — Other Ambulatory Visit: Payer: Self-pay

## 2017-11-03 DIAGNOSIS — I671 Cerebral aneurysm, nonruptured: Secondary | ICD-10-CM | POA: Diagnosis not present

## 2017-11-03 MED ORDER — HYDROCHLOROTHIAZIDE 12.5 MG PO TABS: 12.5000 mg | ORAL_TABLET | Freq: Every day | ORAL | 1 refills | Status: DC

## 2017-11-08 DIAGNOSIS — I671 Cerebral aneurysm, nonruptured: Secondary | ICD-10-CM | POA: Diagnosis not present

## 2017-11-08 DIAGNOSIS — I10 Essential (primary) hypertension: Secondary | ICD-10-CM | POA: Diagnosis not present

## 2017-11-08 DIAGNOSIS — C719 Malignant neoplasm of brain, unspecified: Secondary | ICD-10-CM | POA: Diagnosis not present

## 2017-11-15 ENCOUNTER — Ambulatory Visit: Payer: Medicare Other | Admitting: Family Medicine

## 2017-11-16 ENCOUNTER — Ambulatory Visit: Payer: Medicare Other | Admitting: Audiology

## 2017-12-06 ENCOUNTER — Telehealth: Payer: Self-pay | Admitting: Osteopathic Medicine

## 2017-12-06 ENCOUNTER — Ambulatory Visit (INDEPENDENT_AMBULATORY_CARE_PROVIDER_SITE_OTHER): Payer: Medicare Other | Admitting: Osteopathic Medicine

## 2017-12-06 ENCOUNTER — Encounter: Payer: Self-pay | Admitting: Osteopathic Medicine

## 2017-12-06 ENCOUNTER — Ambulatory Visit (INDEPENDENT_AMBULATORY_CARE_PROVIDER_SITE_OTHER): Payer: Medicare Other

## 2017-12-06 VITALS — BP 147/81 | HR 74 | Temp 97.8°F | Wt 123.1 lb

## 2017-12-06 DIAGNOSIS — M25552 Pain in left hip: Secondary | ICD-10-CM | POA: Diagnosis not present

## 2017-12-06 DIAGNOSIS — M25551 Pain in right hip: Secondary | ICD-10-CM

## 2017-12-06 DIAGNOSIS — M25562 Pain in left knee: Secondary | ICD-10-CM

## 2017-12-06 DIAGNOSIS — I10 Essential (primary) hypertension: Secondary | ICD-10-CM

## 2017-12-06 DIAGNOSIS — Z8739 Personal history of other diseases of the musculoskeletal system and connective tissue: Secondary | ICD-10-CM | POA: Diagnosis not present

## 2017-12-06 DIAGNOSIS — M47896 Other spondylosis, lumbar region: Secondary | ICD-10-CM | POA: Diagnosis not present

## 2017-12-06 DIAGNOSIS — E782 Mixed hyperlipidemia: Secondary | ICD-10-CM | POA: Diagnosis not present

## 2017-12-06 DIAGNOSIS — M545 Low back pain, unspecified: Secondary | ICD-10-CM

## 2017-12-06 DIAGNOSIS — M47816 Spondylosis without myelopathy or radiculopathy, lumbar region: Secondary | ICD-10-CM | POA: Diagnosis not present

## 2017-12-06 LAB — COMPLETE METABOLIC PANEL WITH GFR
AG Ratio: 1.8 (calc) (ref 1.0–2.5)
ALT: 17 U/L (ref 6–29)
AST: 20 U/L (ref 10–35)
Albumin: 4.5 g/dL (ref 3.6–5.1)
Alkaline phosphatase (APISO): 56 U/L (ref 33–130)
BUN: 10 mg/dL (ref 7–25)
CO2: 31 mmol/L (ref 20–32)
Calcium: 10 mg/dL (ref 8.6–10.4)
Chloride: 103 mmol/L (ref 98–110)
Creat: 0.71 mg/dL (ref 0.50–0.99)
GFR, Est African American: 103 mL/min/{1.73_m2} (ref >=60)
GFR, Est Non African American: 89 mL/min/{1.73_m2} (ref >=60)
Globulin: 2.5 g/dL (calc) (ref 1.9–3.7)
Glucose, Bld: 93 mg/dL (ref 65–99)
Potassium: 3.3 mmol/L — ABNORMAL LOW (ref 3.5–5.3)
Sodium: 141 mmol/L (ref 135–146)
Total Bilirubin: 0.5 mg/dL (ref 0.2–1.2)
Total Protein: 7 g/dL (ref 6.1–8.1)

## 2017-12-06 LAB — LIPID PANEL
Cholesterol: 263 mg/dL — ABNORMAL HIGH (ref <200)
HDL: 87 mg/dL (ref >50)
LDL Cholesterol (Calc): 152 mg/dL (calc) — ABNORMAL HIGH
Non-HDL Cholesterol (Calc): 176 mg/dL (calc) — ABNORMAL HIGH (ref <130)
Total CHOL/HDL Ratio: 3 (calc) (ref <5.0)
Triglycerides: 120 mg/dL (ref <150)

## 2017-12-06 MED ORDER — MELOXICAM 15 MG PO TABS: 15.0000 mg | ORAL_TABLET | Freq: Every day | ORAL | 3 refills | Status: DC

## 2017-12-06 MED ORDER — DICLOFENAC SODIUM 1 % TD GEL: 2.0000 g | Freq: Four times a day (QID) | TRANSDERMAL | 11 refills | Status: DC

## 2017-12-06 NOTE — Telephone Encounter (Signed)
Called patient, she will keep her current appt. She is presently having left knee/hips pain. Pt is aware that lab order has been placed.

## 2017-12-06 NOTE — Telephone Encounter (Signed)
Please call patient: She is on my schedule today for recheck cholesterol but she doesn't need to see me she just needs to go to the lab. If anything else going on, she is welcome to keep this appointment, otherwise go downstairs for blood draw if she doesn't have any other issues to address.  Per last progress note: "Follow-up plan: Return in about 3 months (around 12/06/2017) for lab visit for recheck cholesterol, and see Dr. Sheppard Coil in 6 months to recheck BP ."

## 2017-12-06 NOTE — Patient Instructions (Addendum)
Plan:   Mobic/meloxicam daily for pain x1-2 weeks then as needed after that  Voltaren/diclofenac gel for knee pain   Physical therapy - referral sent to the office here in the [place] today   If knee and hip/pack pain is not better or if it gets worse, I would recommend follow-up with one of our sports medicine specialists (Dr Georgina Snell or Dr. Patton Salles Dr. Darene Lamer) for further evaluation in 2-4 weeks. Just call our office and ask for an appointment for sports medicine!

## 2017-12-06 NOTE — Progress Notes (Signed)
HPI: Krista [name] is a 67 y.o. female who  has a past medical history of Arthritis, Bell palsy, Brain aneurysm, Hyperlipidemia, Hypertension, and Osteoporosis.  she presents to [facility] today, 12/06/17,  for chief complaint of:  Chief Complaint  Patient presents with  . Pain    L knee pain and R/L hip pain for a few weeks. No inciting injury or other strain.  L knee reports some medial pain, occasional swelling but none today. Hurts going up and down stairs.  L hip worse than R hip as well as L low back pain/tenderness. Hurts worse lying flat on her back. No numbness/tingling down legs, no falls.   Has tried occasional Aleve which helps some.   Due for DEXA.   HTN: took medications today as usual. No CP/SOB. BP improved on recheck.     Past medical, surgical, social and family history reviewed:  Patient Active Problem List   Diagnosis Date Noted  . Cardiac risk counseling 09/05/2017  . Essential hypertension 09/05/2017  . Mixed hyperlipidemia 09/05/2017  . History of Bell's palsy 08/15/2017  . History of osteoporosis 08/15/2017  . History of colon polyps 08/15/2017  . Diarrhea 08/15/2017  . History of nonmelanoma skin cancer 08/15/2017  . Brain aneurysm 08/15/2017  . Bilateral hearing loss 08/15/2017  . H/O hysterectomy for benign disease 08/15/2017    Past Surgical History:  Procedure Laterality Date  . ABDOMINAL HYSTERECTOMY    . CEREBRAL ANEURYSM REPAIR    . EYE SURGERY     cararact  . FOOT SURGERY    . HAND SURGERY      Social History   Tobacco Use  . Smoking status: Former Smoker    Last attempt to quit: 12/05/2008    Years since quitting: 9.0  . Smokeless tobacco: Never Used  Substance Use Topics  . Alcohol use: No    Family History  Problem Relation Age of Onset  . Heart failure Mother   . Cancer Father      Current medication list and allergy/intolerance information reviewed:    Current Outpatient  Medications  Medication Sig Dispense Refill  . budesonide (ENTOCORT EC) 3 MG 24 hr capsule Take 5 mg 2 (two) times daily by mouth.    . Calcium-Magnesium-Vitamin D 301-60-109 MG-MG-UNIT TB24 Take by mouth daily.    . Cholecalciferol (VITAMIN D3) 5000 units CAPS Take 5,000 Units by mouth daily.    . hydrochlorothiazide (HYDRODIURIL) 12.5 MG tablet Take 1 tablet (12.5 mg total) by mouth daily. 90 tablet 1  . Melatonin 10 MG TABS Take 10 mg by mouth. TAKE 2 TABLETS DAILY    . Potassium 95 MG TABS Take 99 mg by mouth daily.    . pravastatin (PRAVACHOL) 20 MG tablet Take 1 tablet (20 mg total) by mouth daily. 90 tablet 1   No current facility-administered medications for this visit.     Allergies  Allergen Reactions  . Kiwi Extract Hives  . Plum Pulp Hives  . Tomato Hives  . Codeine       Review of Systems:  Constitutional:  No  fever, no chills, No recent illness,  HEENT: No  headache,   Cardiac: No  chest pain, No  pressure, No palpitations,  Respiratory:  No  shortness of breath.  Gastrointestinal: No  abdominal pain,  Musculoskeletal: +new myalgia/arthralgia  Genitourinary: No new incontinence  Hem/Onc: No  easy bruising/bleeding  Neurologic: No  weakness, No  dizziness,  Exam:  BP Marland Kitchen)  147/81   Pulse 74   Temp 97.8 F (36.6 C) (Oral)   Wt 123 lb 1.9 oz (55.8 kg)   BMI 20.49 kg/m   Constitutional: VS see above. General Appearance: alert, well-developed, well-nourished, NAD  Eyes: Normal lids and conjunctive, non-icteric sclera  Ears, Nose, Mouth, Throat: MMM, Normal external inspection ears/nares/mouth/lips/gums.   Neck: No masses, trachea midline.  Respiratory: Normal respiratory effort. no wheeze, no rhonchi, no rales  Cardiovascular: S1/S2 normal, no murmur, no rub/gallop auscultated. RRR. No lower extremity edema.   Musculoskeletal: Gait normal. No clubbing/cyanosis of digits.   L knee no effusion, normal patellar glide, minimal crepitus, neg  ant/post drawer, neg valgus/varus stress, (+)McMurrays' to medial meniscus   (+)LBP paraspinal tenderness bilateral, worse on L, (+)tenderness to SI joints b/l, neg SLR b/l, log roll hurts worse to medial, no trochanteris tenderness   Neurological: Normal balance/coordination. No tremor.   Skin: warm, dry, intact.    Psychiatric: Normal judgment/insight. Normal mood and affect. Oriented x3.    Dg Lumbar Spine 2-3 Views  Result Date: 12/06/2017 CLINICAL DATA:  Lower left back pain and tenderness for 3 weeks. Bilateral hip and left knee pain. EXAM: LUMBAR SPINE - 2-3 VIEW COMPARISON:  None. FINDINGS: There are 5 lumbar type vertebral bodies with small ribs at T12 and a left-sided lumbosacral assimilation joint. The alignment is normal. There is mild disc space narrowing and endplate osteophyte formation at L2-3 and L3-4. Mild facet degenerative changes are present. No evidence of acute fracture or pars defect. There is mild aortic atherosclerosis. IMPRESSION: Mild lumbar spondylosis.  No acute osseous findings or malalignment. Electronically Signed   By: Richardean Sale M.D.   On: 12/06/2017 10:50   Dg Knee Complete 4 Views Left  Result Date: 12/06/2017 CLINICAL DATA:  Left knee and bilateral hip pain.  No known injury. EXAM: LEFT KNEE - COMPLETE 4+ VIEW COMPARISON:  None. FINDINGS: The mineralization and alignment are normal. There is no evidence of acute fracture or dislocation. Questionable minimal medial compartment joint space narrowing. No significant osteophyte formation or joint effusion. There is mild spurring at the quadriceps insertion on the patella. IMPRESSION: No acute findings. Questionable minimal medial compartment joint space narrowing. Electronically Signed   By: Richardean Sale M.D.   On: 12/06/2017 10:47   Dg Hips Bilat W Or W/o Pelvis 2v  Result Date: 12/06/2017 CLINICAL DATA:  Left knee and bilateral hip pain.  No known injury. EXAM: DG HIP (WITH OR WITHOUT PELVIS) 2V BILAT  COMPARISON:  None. FINDINGS: The mineralization and alignment are normal. There is no evidence of acute fracture or dislocation. No evidence of femoral head avascular necrosis. The hip joint spaces are preserved. There is minimal spurring of the right acetabulum. The sacroiliac joints appear normal. There is a left-sided lumbosacral assimilation joint. There are mild degenerative changes at the symphysis pubis. IMPRESSION: No acute osseous findings or significant arthropathic changes. Electronically Signed   By: Richardean Sale M.D.   On: 12/06/2017 10:48     ASSESSMENT/PLAN:   Acute bilateral low back pain without sciatica - Plan: DG Lumbar Spine 2-3 Views, Ambulatory referral to Physical Therapy  Left hip pain - Plan: DG HIPS BILAT W OR W/O PELVIS 2V, Ambulatory referral to Physical Therapy  Acute pain of left knee - Plan: DG Knee Complete 4 Views Left, Ambulatory referral to Physical Therapy  Right hip pain - Plan: DG HIPS BILAT W OR W/O PELVIS 2V, Ambulatory referral to Physical Therapy  Essential hypertension -  BP improved on recheck, will follow next visit   History of osteoporosis - due for DEXA, orders are in, pt to inquire at imaging today about scheudling this     Patient Instructions  Plan:   Mobic/meloxicam daily for pain x1-2 weeks then as needed after that  Voltaren/diclofenac gel for knee pain   Physical therapy - referral sent to the office here in the [place] today   If knee and hip/pack pain is not better or if it gets worse, I would recommend follow-up with one of our sports medicine specialists (Dr Georgina Snell or Dr. Patton Salles Dr. Darene Lamer) for further evaluation in 2-4 weeks. Just call our office and ask for an appointment for sports medicine!    Meds ordered this encounter  Medications  . meloxicam (MOBIC) 15 MG tablet    Sig: Take 1 tablet (15 mg total) by mouth daily.    Dispense:  30 tablet    Refill:  3  . diclofenac sodium (VOLTAREN) 1 % GEL     Sig: Apply 2 g topically 4 (four) times daily. To affected joint.    Dispense:  100 g    Refill:  11     Visit summary with medication list and pertinent instructions was printed for patient to review. All questions at time of visit were answered - patient instructed to contact office with any additional concerns. ER/RTC precautions were reviewed with the patient.   Follow-up plan: Return for recheck with sports medicine as directed .  Note: Total time spent 25 minutes, greater than 50% of the visit was spent face-to-face counseling and coordinating care for the following: The primary encounter diagnosis was Acute bilateral low back pain without sciatica. Diagnoses of Left hip pain, Acute pain of left knee, and Right hip pain were also pertinent to this visit.Marland Kitchen  Please note: voice recognition software was used to produce this document, and typos may escape review. Please contact Dr. Sheppard Coil for any needed clarifications.

## 2017-12-09 ENCOUNTER — Other Ambulatory Visit: Payer: Self-pay | Admitting: Osteopathic Medicine

## 2017-12-09 DIAGNOSIS — E782 Mixed hyperlipidemia: Secondary | ICD-10-CM

## 2017-12-09 DIAGNOSIS — E876 Hypokalemia: Secondary | ICD-10-CM

## 2017-12-09 MED ORDER — PRAVASTATIN SODIUM 20 MG PO TABS: 20.0000 mg | ORAL_TABLET | Freq: Every day | ORAL | 1 refills | Status: DC

## 2017-12-09 MED ORDER — POTASSIUM 95 MG PO TABS: 2.0000 | ORAL_TABLET | Freq: Every day | ORAL | 0 refills | Status: DC

## 2017-12-09 MED ORDER — PRAVASTATIN SODIUM 40 MG PO TABS: 40.0000 mg | ORAL_TABLET | Freq: Every day | ORAL | 3 refills | Status: DC

## 2017-12-09 NOTE — Progress Notes (Signed)
Recheck K Increase statin

## 2017-12-22 ENCOUNTER — Ambulatory Visit: Payer: Medicare Other | Admitting: Rehabilitative and Restorative Service Providers"

## 2017-12-28 ENCOUNTER — Telehealth: Payer: Self-pay | Admitting: Audiology

## 2017-12-28 DIAGNOSIS — R197 Diarrhea, unspecified: Secondary | ICD-10-CM | POA: Diagnosis not present

## 2017-12-28 DIAGNOSIS — K52832 Lymphocytic colitis: Secondary | ICD-10-CM | POA: Diagnosis not present

## 2017-12-28 DIAGNOSIS — Z86010 Personal history of colonic polyps: Secondary | ICD-10-CM | POA: Diagnosis not present

## 2017-12-28 NOTE — Telephone Encounter (Signed)
[facility] does not provide hearing aids. If that is primary reason for visit, follow-up with referent for a hearing aid evaluation and audiogram is recommended.  Sieara Bremer L. Heide Spark, Au.D., CCC-A Doctor of Audiology

## 2018-01-08 ENCOUNTER — Ambulatory Visit (INDEPENDENT_AMBULATORY_CARE_PROVIDER_SITE_OTHER): Payer: Medicare Other | Admitting: Rehabilitative and Restorative Service Providers"

## 2018-01-08 ENCOUNTER — Other Ambulatory Visit: Payer: Self-pay | Admitting: Osteopathic Medicine

## 2018-01-08 ENCOUNTER — Encounter: Payer: Self-pay | Admitting: Rehabilitative and Restorative Service Providers"

## 2018-01-08 DIAGNOSIS — M6281 Muscle weakness (generalized): Secondary | ICD-10-CM

## 2018-01-08 DIAGNOSIS — M545 Low back pain: Secondary | ICD-10-CM

## 2018-01-08 DIAGNOSIS — R29898 Other symptoms and signs involving the musculoskeletal system: Secondary | ICD-10-CM | POA: Diagnosis not present

## 2018-01-08 DIAGNOSIS — E876 Hypokalemia: Secondary | ICD-10-CM

## 2018-01-08 NOTE — Patient Instructions (Signed)
HIP: Hamstrings - Supine  Place strap around foot. Raise leg up, keeping knee straight.  Bend opposite knee to protect back if indicated. Hold 30 seconds. 3 reps per set, 2-3 sets per day   Piriformis Stretch   Lying on back, pull right knee toward opposite shoulder. Hold 30 seconds. Repeat 3 times. Do 2-3 sessions per day.   Quads / HF, Prone KNEE: Quadriceps - Prone    Place strap around ankle. Bring ankle toward buttocks. Press hip into surface. Hold 30 seconds. Repeat 3 times per session. Do 2-3 sessions per day.  TENS UNIT: This is helpful for muscle pain and spasm.   Search and Purchase a TENS 7000 2nd edition at www.tenspros.com. It should be less than $30.     TENS unit instructions: Do not shower or bathe with the unit on Turn the unit off before removing electrodes or batteries If the electrodes lose stickiness add a drop of water to the electrodes after they are disconnected from the unit and place on plastic sheet. If you continued to have difficulty, call the TENS unit company to purchase more electrodes. Do not apply lotion on the skin area prior to use. Make sure the skin is clean and dry as this will help prolong the life of the electrodes. After use, always check skin for unusual red areas, rash or other skin difficulties. If there are any skin problems, does not apply electrodes to the same area. Never remove the electrodes from the unit by pulling the wires. Do not use the TENS unit or electrodes other than as directed. Do not change electrode placement without consultating your therapist or physician. .    For example on for 30 minutes off for 15 minutes and then on for 30 minutes off for 15 minutes

## 2018-01-08 NOTE — Therapy (Addendum)
[place], [place], [address] Phone: [phone]   Fax:  [phone]  Physical Therapy Evaluation  Patient Details  Name: Krista [name] MRN: [id number] Date of Birth: 02/05/1951 Referring Provider: Dr Sheppard Coil   Encounter Date: 01/08/2018  PT End of Session - 01/08/18 1128    Visit Number  1    Number of Visits  12    Date for PT Re-Evaluation  03/05/18    PT Start Time  6948    PT Stop Time  1239    PT Time Calculation (min)  71 min    Activity Tolerance  Patient tolerated treatment well       Past Medical History:  Diagnosis Date  . Arthritis   . Bell palsy   . Brain aneurysm   . Hyperlipidemia   . Hypertension   . Osteoporosis     Past Surgical History:  Procedure Laterality Date  . ABDOMINAL HYSTERECTOMY    . CEREBRAL ANEURYSM REPAIR    . EYE SURGERY     cararact  . FOOT SURGERY    . HAND SURGERY      There were no vitals filed for this visit.   Subjective Assessment - 01/08/18 1135    Subjective  Patient reports that she has had LBP for the past 10 years with no known injury. She has had increase in the LBP in the past month with difficulty sleeping and with functional activities.     Pertinent History  LBP x 10 years; neck pain x 10 yrs ago treated with spinal injections with good improvement; arthritis; ostoporosis; brain aneurysm 2010 repaired surgically; hysterectomy; foot surgeries - several for hammer toes/bursa; ganglion cysts; hand surgeries for trigger finger bilat hands; fx Rt ankle ~ 50 yrs ago; fx of coccyx ~40 yrs ago; Rt RCR ~ 10-15 yrs ago     How long can you sit comfortably?  5 min     How long can you stand comfortably?  30 min     How long can you walk comfortably?  10 min     Diagnostic tests  xrays     Patient Stated Goals  improve LBP     Currently in Pain?  Yes    Pain Score  8     Pain Location  Back    Pain Orientation  Lower lower - at sacrum     Pain Descriptors / Indicators  Throbbing    Pain Type  Acute pain;Chronic pain    Pain Radiating Towards  down into both hips     Pain Onset  More than a month ago    Pain Frequency  Constant    Aggravating Factors   prolonged sitting; lifting 25 pound grandsons; lifting; prolonged standing or walking; squatting hurts knee(locks); lying down to sleep     Pain Relieving Factors  volteren cream; deep blue rub temporary; heat; ice; meds         [name] PT Assessment - 01/08/18 0001      Assessment   Medical Diagnosis  LBP    Referring Provider  Dr Sheppard Coil    Onset Date/Surgical Date  12/05/17    Hand Dominance  Right    Next MD Visit  not scheduled     Prior Therapy  yes PT in [place] ~ 6 wks - 10 to 15 years ago       Precautions   Precautions  None      Balance  Screen   Has the patient fallen in the past 6 months  No    Has the patient had a decrease in activity level because of a fear of falling?   No    Is the patient reluctant to leave their home because of a fear of falling?   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     Leisure  household chores; grandsons; sewing; reading       Observation/Other Assessments   Focus on Therapeutic Outcomes (FOTO)   60% limitation       Sensation   Additional Comments  WFL's per pt report       Posture/Postural Control   Posture Comments  head forward; shoulders rounded and elevated; increased thoracic kyphosis; decreased lumbar lordosis       AROM   Lumbar Flexion  50% pain LB    Lumbar Extension  25% pinch LB    Lumbar - Right Side Bend  70%    Lumbar - Left Side Bend  75%    Lumbar - Right Rotation  65%    Lumbar - Left Rotation  65%      Strength   Overall Strength Comments  5/5 Rt  LE; Lt LE except hip ext, abd, knee flex 4+/5       Flexibility   Hamstrings  tight Rt 45 deg; Lt 40 deg    Quadriceps  tight Rt 120 deg Lt 105 deg     ITB  tight    Piriformis  tight Lt > Rt       Palpation   Spinal mobility  pain with CPA mobs sacrum and lumbar spine and lateral glides though lumbar Lt > Rt     SI assessment   painful with palpation Lt coccyx area; sacrum down Rt when in prone Lt sacral torsion     Palpation comment  tender through the sacrum; lumbar paraspinals; QL; bilat hips in gluts; piriformis; hamstrings Lt > Rt              Objective measurements completed on examination: See above findings.      [facility] PT Treatment/Exercise - 01/08/18 0001      Therapeutic Activites    Therapeutic Activities  -- initiated back care education       Lumbar Exercises: Stretches   Passive Hamstring Stretch  Right;Left;3 reps;30 seconds supine with strap     Quad Stretch  Right;Left;2 reps;30 seconds prone with strap     Piriformis Stretch  Right;Left;2 reps;30 seconds supine travell      Moist Heat Therapy   Number Minutes Moist Heat  20 Minutes    Moist Heat Location  Lumbar Spine      Electrical Stimulation   Electrical Stimulation Location  bilat lumbosacral spine; bilat posterior hips     Electrical Stimulation Action  IFC    Electrical Stimulation Parameters  to tolerance    Electrical Stimulation Goals  Pain;Tone             PT Education - 01/08/18 1219    Education provided  Yes    Education Details  HEP TENs     Person(s) Educated  Patient    Methods  Explanation;Demonstration;Tactile cues;Verbal cues;Handout    Comprehension  Verbalized understanding;Returned demonstration;Verbal cues required;Tactile cues required          PT Long Term Goals - 01/08/18 1240      PT LONG TERM GOAL #1   Title  improve tissue extensibility with patient to demonstrated increased mobility and ROM with LE stretching 4/1 19    Time  8    Period   Weeks    Status  New      PT LONG TERM GOAL #2   Title  Decrease pain by 50-70% allowing patient to perform functional activities with greater ease 03/05/18    Time  8    Period  Weeks    Status  New      PT LONG TERM GOAL #3   Title  Patient reports improved sleeping during the night by 50% 03/05/18    Time  8    Period  Weeks    Status  New      PT LONG TERM GOAL #4   Title  Independent in HEP 03/05/18    Time  8    Period  Weeks    Status  New      PT LONG TERM GOAL #5   Title  Improve FOTO to </= 46% limitation 03/05/18    Time  8    Period  Weeks    Status  New             Plan - 01/08/18 1232    Clinical Impression Statement  Krista [name] presents with acute flare up of chronic LBP. She has had increased symptoms in the past month with pain increased and more difficulty sleeping at night. Pain is worse on the Lt than Rt LB and hip. Patient also has Lt medial knee pain. She has poor posture and alignment; limited functional mobility and ROM through trunk and Lt > Rt LE's; She has pain with resistive strength testing and decreased strength Lt LE as well as pain with palpation through the psoas; lumbar musculature; posterior hips with greatest tightness and discomfort with palpation on the Lt compared to Rt. patient has tenderness and tightness to palpation along the Lt lateral coccyx area and some sacral torsion. She will benefit from PT to address problems identified. Patient will be away to visit he sister in [place] for ~ 1 weeks starting next week. She will be seen once more this week and then scheduled for PT when she returns.     Clinical Presentation  Stable    Clinical Decision Making  Low    Rehab Potential  Good    PT Frequency  2x / week    PT Duration  8 weeks    PT Treatment/Interventions  Patient/family education;ADLs/Self Care Home Management;Cryotherapy;Electrical Stimulation;Iontophoresis 4mg /ml Dexamethasone;Moist Heat;Ultrasound;Dry needling;Manual  techniques;Neuromuscular re-education;Therapeutic activities;Therapeutic exercise    PT Next Visit Plan  review HEP; provide additional exercises and activities for trip; manual work and modalities    Consulted and Agree with Plan of Care  Patient       Patient will benefit from skilled therapeutic intervention in order to improve the following deficits and impairments:  Postural dysfunction, Improper body mechanics, Pain, Increased fascial restricitons, Increased muscle spasms, Decreased mobility, Decreased range of motion, Decreased strength, Decreased activity tolerance  Visit Diagnosis: Acute midline low back pain, with sciatica presence unspecified - Plan: PT plan of care cert/re-cert  Other symptoms and signs involving the musculoskeletal system -  Plan: PT plan of care cert/re-cert  Muscle weakness (generalized) - Plan: PT plan of care cert/re-cert     Problem List Patient Active Problem List   Diagnosis Date Noted  . Cardiac risk counseling 09/05/2017  . Essential hypertension 09/05/2017  . Mixed hyperlipidemia 09/05/2017  . History of Bell's palsy 08/15/2017  . History of osteoporosis 08/15/2017  . History of colon polyps 08/15/2017  . Diarrhea 08/15/2017  . History of nonmelanoma skin cancer 08/15/2017  . Brain aneurysm 08/15/2017  . Bilateral hearing loss 08/15/2017  . H/O hysterectomy for benign disease 08/15/2017    Krista [name] Nilda Simmer PT, MPH  01/08/2018, 1:05 PM  [facility] [name], [place], [address] Phone: [phone]   Fax:  [phone]  Name: Amanpreet Delmont MRN: [id number] Date of Birth: 07/13/1951

## 2018-01-08 NOTE — Progress Notes (Signed)
Order placed due to barcode error on original order.

## 2018-01-11 ENCOUNTER — Ambulatory Visit (INDEPENDENT_AMBULATORY_CARE_PROVIDER_SITE_OTHER): Payer: Medicare Other | Admitting: Physical Therapy

## 2018-01-11 DIAGNOSIS — M6281 Muscle weakness (generalized): Secondary | ICD-10-CM | POA: Diagnosis not present

## 2018-01-11 DIAGNOSIS — M545 Low back pain: Secondary | ICD-10-CM

## 2018-01-11 DIAGNOSIS — R29898 Other symptoms and signs involving the musculoskeletal system: Secondary | ICD-10-CM | POA: Diagnosis not present

## 2018-01-11 NOTE — Therapy (Addendum)
[place], [place], [address] Phone: [phone]   Fax:  [phone]  Physical Therapy Treatment  Patient Details  Name: Krista [name] MRN: [id number] Date of Birth: 1951-02-19 Referring Provider: Dr. Sheppard Coil   Encounter Date: 01/11/2018  PT End of Session - 01/11/18 1622    Visit Number  2    Number of Visits  12    Date for PT Re-Evaluation  03/05/18    PT Start Time  1751 pt arrived late    PT Stop Time  1630    PT Time Calculation (min)  52 min    Behavior During Therapy  [facility] for tasks assessed/performed       Past Medical History:  Diagnosis Date  . Arthritis   . Bell palsy   . Brain aneurysm   . Hyperlipidemia   . Hypertension   . Osteoporosis     Past Surgical History:  Procedure Laterality Date  . ABDOMINAL HYSTERECTOMY    . CEREBRAL ANEURYSM REPAIR    . EYE SURGERY     cararact  . FOOT SURGERY    . HAND SURGERY      There were no vitals filed for this visit.  Subjective Assessment - 01/11/18 1543    Subjective  Pt arrives with TENS unit; would like to know how to apply it/use it. She hasn't found a strap for stretches. She feels the same as last visit.  She ironed for an hour and that really aggrivated her back.     Currently in Pain?  Yes    Pain Score  7     Pain Location  Back    Pain Orientation  Right;Lower    Pain Descriptors / Indicators  Dull    Pain Radiating Towards  down into hips         [facility] PT Assessment - 01/11/18 0001      Assessment   Medical Diagnosis  LBP    Referring Provider  Dr. Sheppard Coil    Onset Date/Surgical Date  12/05/17    Hand Dominance  Right    Next MD Visit  not scheduled       Palpation   SI assessment   Lt ASIS higher than Rt; Rt sacral torsion; Lt PSIS (tender) lowre than Rt.  Level ilium.     Palpation comment  Tender Lt ASIS and psoas.        [place] Adult PT Treatment/Exercise - 01/11/18 0001      Self-Care   Self-Care  Other Self-Care Comments    Other Self-Care Comments   Pt educated on parameters, application and safety of TENS use.  Pt verbalized understanding.       Lumbar Exercises: Stretches   Active Hamstring Stretch  --    Passive Hamstring Stretch  Right;Left;30 seconds;2 reps supine with strap     Quad Stretch  Right;Left;2 reps;30 seconds prone with strap     Piriformis Stretch  Right;Left;2 reps;30 seconds supine travell    Other Lumbar Stretch Exercise  Pt shown seated version of hamstring stretch for HEP.       Lumbar Exercises: Aerobic   Nustep  L5: 1.5 min; Lt hip 8/10 pain; stopped.       Lumbar Exercises: Seated   Other Seated Lumbar Exercises  Pt shown sit to/from supine via demo; pt returned demo with VC.       Moist Heat Therapy   Number Minutes Moist Heat  15 Minutes  Moist Heat Location  Lumbar Spine;Hip Ant Lt      Acupuncturist Location  bilat lumbosacral spine; bilat posterior hips     Electrical Stimulation Action  IFC    Electrical Stimulation Parameters  to tolerance     Electrical Stimulation Goals  Pain;Tone      Manual Therapy   Manual Therapy  Soft tissue mobilization;Muscle Energy Technique    Soft tissue mobilization  to Rt glute     Muscle Energy Technique  MET to correct posterior rotated Lt ilium, MET to correct Rt sacral torsion (pt very guarded).              PT Education - 01/11/18 1801    Education provided  Yes    Education Details  TENS.     Person(s) Educated  Patient    Methods  Explanation;Verbal cues    Comprehension  Verbalized understanding          PT Long Term Goals - 01/08/18 1240      PT LONG TERM GOAL #1   Title  improve tissue extensibility with patient to demonstrated increased mobility and ROM with LE stretching 4/1 19    Time  8    Period  Weeks    Status  New      PT LONG TERM GOAL #2   Title  Decrease pain by 50-70% allowing patient to perform functional activities  with greater ease 03/05/18    Time  8    Period  Weeks    Status  New      PT LONG TERM GOAL #3   Title  Patient reports improved sleeping during the night by 50% 03/05/18    Time  8    Period  Weeks    Status  New      PT LONG TERM GOAL #4   Title  Independent in HEP 03/05/18    Time  8    Period  Weeks    Status  New      PT LONG TERM GOAL #5   Title  Improve FOTO to </= 46% limitation 03/05/18    Time  8    Period  Weeks    Status  New            Plan - 01/11/18 1752    Clinical Impression Statement  Pt had some pelvis asymmetries; slightly improved with MET correction.  Pt guarded with manual therapy; point tender around Lt ASIS/PSIS/Lt SI joint.  Pt unable to tolerate NuStep as this worsened her Lt hip pain.  Pt reported reduction in pain wiht use of estim/MHP.  Pt will be out of town visiting family for 2 wks.     Rehab Potential  Good    PT Frequency  2x / week    PT Duration  8 weeks    PT Treatment/Interventions  Patient/family education;ADLs/Self Care Home Management;Cryotherapy;Electrical Stimulation;Iontophoresis 53m/ml Dexamethasone;Moist Heat;Ultrasound;Dry needling;Manual techniques;Neuromuscular re-education;Therapeutic activities;Therapeutic exercise    PT Next Visit Plan  assess pelvis and alignment.  Progress core/spine stabilizing exercises.     Consulted and Agree with Plan of Care  Patient       Patient will benefit from skilled therapeutic intervention in order to improve the following deficits and impairments:  Postural dysfunction, Improper body mechanics, Pain, Increased fascial restricitons, Increased muscle spasms, Decreased mobility, Decreased range of motion, Decreased strength, Decreased activity tolerance  Visit Diagnosis: Acute midline low back pain, with sciatica presence unspecified  Other symptoms and signs involving the musculoskeletal system  Muscle weakness (generalized)     Problem List Patient Active Problem List   Diagnosis Date  Noted  . Cardiac risk counseling 09/05/2017  . Essential hypertension 09/05/2017  . Mixed hyperlipidemia 09/05/2017  . History of Bell's palsy 08/15/2017  . History of osteoporosis 08/15/2017  . History of colon polyps 08/15/2017  . Diarrhea 08/15/2017  . History of nonmelanoma skin cancer 08/15/2017  . Brain aneurysm 08/15/2017  . Bilateral hearing loss 08/15/2017  . H/O hysterectomy for benign disease 08/15/2017   Kerin Perna, PTA 01/11/18 6:01 PM  [facility] [place] [place], [place], [address] Phone: [phone]   Fax:  [phone]  Name: Reigna Ruperto MRN: [id number] Date of Birth: [birth year]  PHYSICAL THERAPY DISCHARGE SUMMARY  Visits from Start of Care: 2  Current functional level related to goals / functional outcomes: See progress note for discharge status   Remaining deficits: No significant change in status    Education / Equipment: HEP Plan: Patient agrees to discharge.  Patient goals were not met. Patient is being discharged due to not returning since the last visit.  ?????     Celyn P. Helene Kelp PT, MPH 03/02/18 4:11 PM

## 2018-01-24 ENCOUNTER — Telehealth: Payer: Self-pay | Admitting: Physical Therapy

## 2018-01-24 ENCOUNTER — Encounter: Payer: Medicare Other | Admitting: Physical Therapy

## 2018-01-24 NOTE — Telephone Encounter (Signed)
Called patient regarding her missed physical therapy appointment.  No answer.  Voice mail left for her to return phone call to reschedule.  Kerin Perna, PTA 01/24/18 2:56 PM

## 2018-01-31 ENCOUNTER — Ambulatory Visit: Payer: Medicare Other | Admitting: Audiology

## 2018-02-08 DIAGNOSIS — J0100 Acute maxillary sinusitis, unspecified: Secondary | ICD-10-CM | POA: Diagnosis not present

## 2018-02-08 DIAGNOSIS — R05 Cough: Secondary | ICD-10-CM | POA: Diagnosis not present

## 2018-02-23 ENCOUNTER — Other Ambulatory Visit: Payer: Self-pay | Admitting: Osteopathic Medicine

## 2018-02-23 DIAGNOSIS — E782 Mixed hyperlipidemia: Secondary | ICD-10-CM

## 2018-02-23 NOTE — Telephone Encounter (Signed)
As per provider's note - pt is due to recheck cholesterol levels before next med refill. Contacted pt, she will complete lab order on 02/26/18. Pt is aware med refill will pend until lab results are rec'd & review by pcp.

## 2018-02-27 DIAGNOSIS — E876 Hypokalemia: Secondary | ICD-10-CM | POA: Diagnosis not present

## 2018-02-27 LAB — COMPLETE METABOLIC PANEL WITH GFR
AG Ratio: 1.8 (calc) (ref 1.0–2.5)
ALT: 15 U/L (ref 6–29)
AST: 22 U/L (ref 10–35)
Albumin: 4.8 g/dL (ref 3.6–5.1)
Alkaline phosphatase (APISO): 69 U/L (ref 33–130)
BUN: 14 mg/dL (ref 7–25)
CO2: 29 mmol/L (ref 20–32)
Calcium: 10.2 mg/dL (ref 8.6–10.4)
Chloride: 102 mmol/L (ref 98–110)
Creat: 0.81 mg/dL (ref 0.50–0.99)
GFR, Est African American: 88 mL/min/{1.73_m2} (ref >=60)
GFR, Est Non African American: 76 mL/min/{1.73_m2} (ref >=60)
Globulin: 2.6 g/dL (calc) (ref 1.9–3.7)
Glucose, Bld: 98 mg/dL (ref 65–99)
Potassium: 3.6 mmol/L (ref 3.5–5.3)
Sodium: 141 mmol/L (ref 135–146)
Total Bilirubin: 0.5 mg/dL (ref 0.2–1.2)
Total Protein: 7.4 g/dL (ref 6.1–8.1)

## 2018-02-28 ENCOUNTER — Other Ambulatory Visit: Payer: Self-pay

## 2018-02-28 MED ORDER — MELOXICAM 15 MG PO TABS: 15.0000 mg | ORAL_TABLET | Freq: Every day | ORAL | 3 refills | Status: DC

## 2018-03-05 ENCOUNTER — Telehealth: Payer: Self-pay

## 2018-03-05 MED ORDER — ATORVASTATIN CALCIUM 20 MG PO TABS: 20.0000 mg | ORAL_TABLET | Freq: Every day | ORAL | 1 refills | Status: DC

## 2018-03-05 NOTE — Telephone Encounter (Signed)
As per [facility] - pt requested to lower out of pocket cost for pravastatin. Currently paying $49. Pharmacy states that atorvastatin cost is at $28. Pt would like to switch. Pls advise if appropriate, thanks.

## 2018-03-05 NOTE — Telephone Encounter (Signed)
As per pt, she just pick up pravastatin medication from pharmacy. She will continue to take current medication until med is finished. Pt is aware atorvastatin available at pharmacy.  No other inquiries during call.

## 2018-03-05 NOTE — Telephone Encounter (Signed)
Sent lipitor at 20mg . It is stronger than pravastatin.

## 2018-03-07 ENCOUNTER — Ambulatory Visit (INDEPENDENT_AMBULATORY_CARE_PROVIDER_SITE_OTHER): Payer: Medicare Other | Admitting: Physician Assistant

## 2018-03-07 ENCOUNTER — Encounter: Payer: Self-pay | Admitting: Physician Assistant

## 2018-03-07 VITALS — BP 136/78 | HR 73 | Temp 97.7°F | Wt 129.0 lb

## 2018-03-07 DIAGNOSIS — Z9089 Acquired absence of other organs: Secondary | ICD-10-CM | POA: Diagnosis not present

## 2018-03-07 DIAGNOSIS — H9202 Otalgia, left ear: Secondary | ICD-10-CM | POA: Diagnosis not present

## 2018-03-07 DIAGNOSIS — H6122 Impacted cerumen, left ear: Secondary | ICD-10-CM

## 2018-03-07 DIAGNOSIS — K120 Recurrent oral aphthae: Secondary | ICD-10-CM

## 2018-03-07 DIAGNOSIS — H7402 Tympanosclerosis, left ear: Secondary | ICD-10-CM

## 2018-03-07 HISTORY — DX: Tympanosclerosis, left ear: H74.02

## 2018-03-07 HISTORY — DX: Acquired absence of other organs: Z90.89

## 2018-03-07 HISTORY — DX: Impacted cerumen, left ear: H61.22

## 2018-03-07 MED ORDER — TRIAMCINOLONE ACETONIDE 0.1 % MT PSTE: 1.0000 "application " | PASTE | Freq: Four times a day (QID) | OROMUCOSAL | 0 refills | Status: AC

## 2018-03-07 NOTE — Patient Instructions (Addendum)
General measures for aphthous ulcers: - Continue good oral hygiene (brushing twice daily and flossing once daily) - Avoid Toothpaste containing Sodium Lauryl Sulfate (SLS) - Start Biotene dry mouth rinse - Use Triamcinolone paste 4 times daily as needed - you can do this before meals and at bedtime - Soft diet and avoid acidic foods    Oral Ulcers Oral ulcers are sores inside the mouth or near the mouth. They may be called canker sores or cold sores, which are two types of oral ulcers. Many oral ulcers are harmless and go away on their own. In some cases, oral ulcers may require medical care to determine the cause and proper treatment. What are the causes? Common causes of this condition include:  Viral, bacterial, or fungal infection.  Emotional stress.  Foods or chemicals that irritate the mouth.  Injury or physical irritation of the mouth.  Medicines.  Allergies.  Tobacco use.  Less common causes include:  Skin disease.  A type of herpes virus infection (herpes simplexor herpes zoster).  Oral cancer.  In some cases, the cause of this condition may not be known. What increases the risk? Oral ulcers are more likely to develop in:  People who wear dental braces, dentures, or retainers.  People who do not keep their mouth clean or brush their teeth regularly.  People who have sensitive skin.  People who have conditions that affect the entire body (systemic conditions), such as immune disorders.  What are the signs or symptoms? The main symptom of this condition is one or more oval-shaped or round ulcers that have red borders. Details about symptoms may vary depending on the cause.  Location of the ulcers. They may be inside the mouth, on the gums, or on the insides of the lips or cheeks. They may also be on the lips or on skin that is near the mouth, such as the cheeks and chin.  Pain. Ulcers can be painful and uncomfortable, or they can be painless.  Appearance  of the ulcers. They may look like red blisters and be filled with fluid, or they may be white or yellow patches.  Frequency of outbreaks. Ulcers may go away permanently after one outbreak, or they may come back (recur) often or rarely.  How is this diagnosed? This condition is diagnosed with a physical exam. Your health care provider may ask you questions about your lifestyle and your medical history. You may have tests, including:  Blood tests.  Removal of a small number of cells from an ulcer to be examined under a microscope (biopsy).  How is this treated? This condition is treated by managing any pain and discomfort, and by treating the underlying cause of the ulcers, if necessary. Usually, oral ulcers resolve by themselves in 1-2 weeks. You may be told to keep your mouth clean and avoid things that cause or irritate your ulcers. Your health care provider may prescribe medicines to reduce pain and discomfort or treat the underlying cause, if this applies. Follow these instructions at home: Lifestyle  Follow instructions from your health care provider about eating or drinking restrictions. ? Drink enough fluid to keep your urine clear or pale yellow. ? Avoid foods and drinks that irritate your ulcers.  Avoid tobacco products, including cigarettes, chewing tobacco, or e-cigarettes. If you need help quitting, ask your health care provider.  Avoid excessive alcohol use. Oral Hygiene  Avoid physical or chemical irritants that may have caused the ulcers or made them worse, such as mouthwashes  that contain alcohol (ethanol). If you wear dental braces, dentures, or retainers, work with your health care provider to make sure these devices are fitted correctly.  Brush and floss your teeth at least once every day, and get regular dental cleanings and checkups.  Gargle with a salt-water mixture 3-4 times per day or as told by your health care provider. To make a salt-water mixture, completely  dissolve -1 tsp of salt in 1 cup of warm water. General instructions  Take over-the-counter and prescription medicines only as told by your health care provider.  If you have pain, wrap a cold compress in a towel and gently press it against your face to help reduce pain.  Keep all follow-up visits as told by your health care provider. This is important. Contact a health care provider if:  You have pain that gets worse or does not get better with medicine.  You have 4 or more ulcers at one time.  You have a fever.  You have new ulcers that look or feel different from other ulcers you have.  You have inflammation in one eye or both eyes.  You have ulcers that do not go away after 10 days.  You develop new symptoms in your mouth, such as: ? Bleeding or crusting around your lips or gums. ? Tooth pain. ? Difficulty swallowing.  You develop symptoms on your skin or genitals, such as: ? A rash or blisters. ? Burning or itching sensations.  Your ulcers begin or get worse after you start a new medicine. Get help right away if:  You have difficulty breathing.  You have swelling in your face or neck.  You have excessive bleeding from your mouth.  You have severe pain. This information is not intended to replace advice given to you by your health care provider. Make sure you discuss any questions you have with your health care provider. Document Released: 12/29/2004 Document Revised: 04/25/2016 Document Reviewed: 04/08/2015 Elsevier Interactive Patient Education  [employer].

## 2018-03-07 NOTE — Progress Notes (Signed)
HPI:                                                                Krista [name] is a 67 y.o. female who presents to [facility] [place] today for oral ulcers  Patient reports for the last 2 months she has had multiple, recurrent painful oral lesions on her mucosa and tongue. Lesions last approximately 2-3 weeks. Has tried Oragel without relief. She saw her dentist who recommended a new dry mouth toothpaste. Denies fever, chills, dysphagia, odynophagia. No history of herpes.  She also c/o of recurrent left ear pain and muffled hearing. Reports history of cerumen impaction and mastoidectomy on that side 45 years ago. Pain is in the mastoid region and radiates to her jaw.    No flowsheet data found.    Past Medical History:  Diagnosis Date  . Arthritis   . Bell palsy   . Brain aneurysm   . Hyperlipidemia   . Hypertension   . Osteoporosis    Past Surgical History:  Procedure Laterality Date  . ABDOMINAL HYSTERECTOMY    . CEREBRAL ANEURYSM REPAIR    . EYE SURGERY     cararact  . FOOT SURGERY    . HAND SURGERY    . MASTOIDECTOMY Left    Social History   Tobacco Use  . Smoking status: Former Smoker    Last attempt to quit: 12/05/2008    Years since quitting: 9.2  . Smokeless tobacco: Never Used  Substance Use Topics  . Alcohol use: No   family history includes Cancer in her father; Heart failure in her mother.    ROS: negative except as noted in the HPI  Medications: Current Outpatient Medications  Medication Sig Dispense Refill  . atorvastatin (LIPITOR) 20 MG tablet Take 1 tablet (20 mg total) by mouth daily. 90 tablet 1  . Calcium-Magnesium-Vitamin D [id number] MG-MG-UNIT TB24 Take by mouth daily.    . Cholecalciferol (VITAMIN D3) 5000 units CAPS Take 5,000 Units by mouth daily.    . hydrochlorothiazide (HYDRODIURIL) 12.5 MG tablet Take 1 tablet (12.5 mg total) by mouth daily. 90 tablet 1  . Potassium 95 MG TABS Take 2  tablets (190 mg total) by mouth daily. 180 tablet 0  . triamcinolone (KENALOG) 0.1 % paste Use as directed 1 application in the mouth or throat 4 (four) times daily for 7 days. 28 g 0   No current facility-administered medications for this visit.    Allergies  Allergen Reactions  . Kiwi Extract Hives  . Plum Pulp Hives  . Tomato Hives  . Codeine        Objective:  BP 136/78   Pulse 73   Temp 97.7 F (36.5 C) (Oral)   Wt 129 lb (58.5 kg)   BMI 21.47 kg/m  Gen:  alert, not ill-appearing, no distress, appropriate for age 68: head normocephalic without obvious abnormality, conjunctiva and cornea clear, oral mucosa with moist mucous membranes, aphthous ulcer of the tongue and right buccal mucosa, neck supple, no cervical adenopathy, left ear canal completely obstructed by cerumen with pain on speculum exam, right TM pearly gray and semi-transparent, trachea midline Pulm: Normal work of breathing, normal phonation Neuro: alert and oriented x 3, left facial nerve  palsy MSK: extremities atraumatic, normal gait and station Skin: intact, no rashes on exposed skin, no jaundice, no cyanosis Psych: well-groomed, cooperative, good eye contact, euthymic mood, affect mood-congruent, speech is articulate, and thought processes clear and goal-directed  Ceruminosis noted in the left ear canal. Colace placed in external canal for 5 minutes. Ear irrigated with room temperature water by Cristino Martes, LPN. Patient tolerated the procedure without any complications. Post-irrigation examination shows cerumen was completely removed. TM with sclerosis and slightly retracted.    No results found for this or any previous visit (from the past 72 hour(s)). No results found.    Assessment and Plan 67 y.o. female with   Aphthous ulcer - Plan: triamcinolone (KENALOG) 0.1 % paste  Otalgia, left - Plan: Ambulatory referral to ENT  Impacted cerumen of left ear - Plan: Ambulatory referral to  ENT  History of left mastoidectomy - Plan: Ambulatory referral to ENT  Tympanosclerosis, left ear  Aphthous ulcer - Continue good oral hygiene (brushing twice daily and flossing once daily) - Avoid Toothpaste containing Sodium Lauryl Sulfate (SLS) - Start Biotene dry mouth rinse - Use Triamcinolone paste 4 times daily as needed - you can do this before meals and at bedtime - Soft diet and avoid acidic foods  Ear irrigation in office today and cerumen completely removed. Patient made aware that they may experience temporary vertigo, temporary hearing loss, and temporary discomfort. If symptoms persist after 24 hours, return to office  Given patient's persistent otalgia and hearing loss, sclerosis and retraction on exam, and hx of mastoidectomy recommend follow-up with ENT   Patient education and anticipatory guidance given Patient agrees with treatment plan Follow-up as needed if symptoms worsen or fail to improve  Darlyne Russian PA-C

## 2018-04-03 DIAGNOSIS — H90A32 Mixed conductive and sensorineural hearing loss, unilateral, left ear with restricted hearing on the contralateral side: Secondary | ICD-10-CM | POA: Diagnosis not present

## 2018-04-03 DIAGNOSIS — H838X3 Other specified diseases of inner ear, bilateral: Secondary | ICD-10-CM | POA: Diagnosis not present

## 2018-05-05 ENCOUNTER — Other Ambulatory Visit: Payer: Self-pay | Admitting: Osteopathic Medicine

## 2018-05-07 NOTE — Telephone Encounter (Signed)
Pt advised. Transferred to scheduling for appointment.

## 2018-05-07 NOTE — Telephone Encounter (Signed)
[facility]   Last written 11-03-17 for 90 with 1 RF  Last OV with PCP was 12-06-17 but did have labs dine in March 2019  Can you please review and advise if pt is ok for 90 day RF or if RF should be sent for 30 with a note to make a follow up appt.  Thanks!

## 2018-05-07 NOTE — Telephone Encounter (Signed)
I sent it! If I haven't seen her since January, usually I like to see my folks wit high blood pressure every 6 months for routine check. Please have her schedule a visit sometime in July  Thanks!

## 2018-05-10 ENCOUNTER — Ambulatory Visit (INDEPENDENT_AMBULATORY_CARE_PROVIDER_SITE_OTHER): Payer: Medicare Other

## 2018-05-10 ENCOUNTER — Ambulatory Visit (INDEPENDENT_AMBULATORY_CARE_PROVIDER_SITE_OTHER): Payer: Medicare Other | Admitting: Osteopathic Medicine

## 2018-05-10 ENCOUNTER — Encounter: Payer: Self-pay | Admitting: Osteopathic Medicine

## 2018-05-10 VITALS — BP 117/73 | HR 90 | Temp 98.0°F | Wt 126.0 lb

## 2018-05-10 DIAGNOSIS — R197 Diarrhea, unspecified: Secondary | ICD-10-CM | POA: Diagnosis not present

## 2018-05-10 DIAGNOSIS — R059 Cough, unspecified: Secondary | ICD-10-CM

## 2018-05-10 DIAGNOSIS — I10 Essential (primary) hypertension: Secondary | ICD-10-CM

## 2018-05-10 DIAGNOSIS — R05 Cough: Secondary | ICD-10-CM | POA: Diagnosis not present

## 2018-05-10 DIAGNOSIS — J440 Chronic obstructive pulmonary disease with acute lower respiratory infection: Secondary | ICD-10-CM | POA: Diagnosis not present

## 2018-05-10 DIAGNOSIS — E782 Mixed hyperlipidemia: Secondary | ICD-10-CM | POA: Diagnosis not present

## 2018-05-10 DIAGNOSIS — Z8709 Personal history of other diseases of the respiratory system: Secondary | ICD-10-CM | POA: Diagnosis not present

## 2018-05-10 DIAGNOSIS — J209 Acute bronchitis, unspecified: Secondary | ICD-10-CM

## 2018-05-10 DIAGNOSIS — R0602 Shortness of breath: Secondary | ICD-10-CM

## 2018-05-10 HISTORY — DX: Acute bronchitis, unspecified: J20.9

## 2018-05-10 MED ORDER — BENZONATATE 200 MG PO CAPS: 200.0000 mg | ORAL_CAPSULE | Freq: Three times a day (TID) | ORAL | 0 refills | Status: DC | PRN

## 2018-05-10 MED ORDER — AZITHROMYCIN 250 MG PO TABS: ORAL_TABLET | ORAL | 0 refills | Status: DC

## 2018-05-10 MED ORDER — PREDNISONE 20 MG PO TABS: 20.0000 mg | ORAL_TABLET | Freq: Two times a day (BID) | ORAL | 0 refills | Status: DC

## 2018-05-10 NOTE — Progress Notes (Signed)
HPI: Krista [name] is a 67 y.o. female who  has a past medical history of Arthritis, Bell palsy, Brain aneurysm, Hyperlipidemia, Hypertension, and Osteoporosis.  she presents to [facility] today, 05/10/18,  for chief complaint of:  Recheck HTN, HLD New: cough  HTN: BP controlled today, no CP/SOB, no HA/VC/Dizzy.   HLD: Last lipid check 12/06/17, LDL 152, HDL 87. We increased pravastatin at that time. Later she switched from pravastatin to atorvastatin d/t cost, but today states she is taking Pravastatin 20 mg.   Cough:  3 weeks or so, taking Delsym and Mucinex. Has been worse at night and is affecting sleep and causing rib pain. Associated with some loose stool.  She is a former smoker without any previous inhaler use, she states she was once diagnosed with COPD though this is not on her problem list, medical history was updated.    X-ray images were personally reviewed with the patient, suspicion for COPD based on some hyperinflation and flattening of the diaphragm.  Dg Chest 2 View  Result Date: 05/10/2018 CLINICAL DATA:  Cough and shortness of breath.  Congestion. EXAM: CHEST - 2 VIEW COMPARISON:  None. FINDINGS: Lungs are hyperexpanded. Interstitial markings are diffusely coarsened with chronic features. The lungs are clear without focal pneumonia, edema, pneumothorax or pleural effusion. The visualized bony structures of the thorax are intact. IMPRESSION: No active cardiopulmonary disease. Electronically Signed   By: Misty Stanley M.D.   On: 05/10/2018 15:03      Past medical history, surgical history, and family history reviewed.  Current medication list and allergy/intolerance information reviewed.   (See remainder of HPI, ROS, Phys Exam below)     ASSESSMENT/PLAN:   Cough - Likely complicated by COPD exacerbation, though seasonal allergies and postnasal drip, possibly GERD can be complicating the picture - Plan: DG Chest 2  View  Diarrhea, unspecified type  Essential hypertension - Plan: COMPLETE METABOLIC PANEL WITH GFR, Lipid panel  Mixed hyperlipidemia - Plan: COMPLETE METABOLIC PANEL WITH GFR, Lipid panel  Acute bronchitis with COPD ([place])  History of COPD - She declines inhaler management at this time, will go ahead and at least treat with antibiotics and steroids.  Strongly consider RTC for PFT   Meds ordered this encounter  Medications  . azithromycin (ZITHROMAX) 250 MG tablet    Sig: 2 tabs po on Day 1, then 1 tab daily Days 2 - 5    Dispense:  6 tablet    Refill:  0  . predniSONE (DELTASONE) 20 MG tablet    Sig: Take 1 tablet (20 mg total) by mouth 2 (two) times daily with a meal.    Dispense:  10 tablet    Refill:  0  . benzonatate (TESSALON) 200 MG capsule    Sig: Take 1 capsule (200 mg total) by mouth 3 (three) times daily as needed for cough.    Dispense:  30 capsule    Refill:  0    Patient Instructions  Cough: Common causes of persistent cough in non-smokers can include: allergies, inflammation following a viral infection, new or recurrent viral bronchitis, or underlying pneumonia. We are going to basically treat all possible causes! Your smokinghistory is probably playing a role here, too.   Nasal spray (Flonase) + antihistamines (Claritin, Allegra, or Zyrtec) for allergies  Steroid burst (Prednisone) for possible post-viral inflammation, can consider adding an inhaler. In folks with COPD, steroids are the main thing that helps!   Antibiotics (Azithromycin) for possible  bacterial infection, though this will not help a viral bronchitis  Cough medicine (Tessalon) for symptoms especially night-time. Can also use over-the-counter Dextromethorphan (Robitussin, others) - cough suppressant with Guaifenesin (Robitussin, Mucinex, others) - expectorant (helps cough up mucus), Lozenges w/ Benzocaine + Menthol (Cepacol), Honey - as much as you want!        Follow-up plan: Return if  symptoms worsen or fail to improve within one week, call us.     ############################################ ############################################ ############################################ ############################################    Outpatient Encounter Medications as of 05/10/2018  Medication Sig  . atorvastatin (LIPITOR) 20 MG tablet Take 1 tablet (20 mg total) by mouth daily.  . Calcium-Magnesium-Vitamin D 009-23-300 MG-MG-UNIT TB24 Take by mouth daily.  . Cholecalciferol (VITAMIN D3) 5000 units CAPS Take 5,000 Units by mouth daily.  . hydrochlorothiazide (HYDRODIURIL) 12.5 MG tablet TAKE 1 TABLET BY MOUTH EVERY DAY  . Potassium 95 MG TABS Take 2 tablets (190 mg total) by mouth daily.   No facility-administered encounter medications on file as of 05/10/2018.    Allergies  Allergen Reactions  . Kiwi Extract Hives  . Plum Pulp Hives  . Tomato Hives  . Codeine       Review of Systems:  HEENT: No  headache, no vision change  Cardiac: No  chest pain, No  pressure  Respiratory:  No  shortness of breath. +Cough  Gastrointestinal: No  abdominal pain, no change on bowel habits  Musculoskeletal: No new myalgia/arthralgia  Skin: No  Rash  Neurologic: No  weakness, No  Dizziness  Exam:  BP 117/73 (BP Location: Left Arm, Patient Position: Sitting, Cuff Size: Normal)   Pulse 90   Temp 98 F (36.7 C) (Oral)   Wt 126 lb (57.2 kg)   SpO2 97%   BMI 20.97 kg/m   Constitutional: VS see above. General Appearance: alert, well-developed, well-nourished, NAD  Eyes: Normal lids and conjunctive, non-icteric sclera  Ears, Nose, Mouth, Throat: MMM, Normal external inspection ears/nares/mouth/lips/gums.  Neck: No masses, trachea midline.   Respiratory: Normal respiratory effort. no wheeze, no rhonchi, no rales  Cardiovascular: S1/S2 normal, no murmur, no rub/gallop auscultated. RRR.   Musculoskeletal: Gait normal. Symmetric and independent movement of all  extremities  Neurological: Normal balance/coordination. No tremor.  Skin: warm, dry, intact.   Psychiatric: Normal judgment/insight. Normal mood and affect. Oriented x3.   Visit summary with medication list and pertinent instructions was printed for patient to review, advised to alert [place] if any changes needed. All questions at time of visit were answered - patient instructed to contact office with any additional concerns. ER/RTC precautions were reviewed with the patient and understanding verbalized.   Follow-up plan: Return if symptoms worsen or fail to improve within one week, call us.    Please note: voice recognition software was used to produce this document, and typos may escape review. Please contact Dr. Sheppard Coil for any needed clarifications.

## 2018-05-10 NOTE — Patient Instructions (Addendum)
Cough: Common causes of persistent cough in non-smokers can include: allergies, inflammation following a viral infection, new or recurrent viral bronchitis, or underlying pneumonia. We are going to basically treat all possible causes! Your smokinghistory is probably playing a role here, too.   Nasal spray (Flonase) + antihistamines (Claritin, Allegra, or Zyrtec) for allergies  Steroid burst (Prednisone) for possible post-viral inflammation, can consider adding an inhaler. In folks with COPD, steroids are the main thing that helps!   Antibiotics (Azithromycin) for possible bacterial infection, though this will not help a viral bronchitis  Cough medicine (Tessalon) for symptoms especially night-time. Can also use over-the-counter Dextromethorphan (Robitussin, others) - cough suppressant with Guaifenesin (Robitussin, Mucinex, others) - expectorant (helps cough up mucus), Lozenges w/ Benzocaine + Menthol (Cepacol), Honey - as much as you want!

## 2018-05-11 ENCOUNTER — Encounter: Payer: Self-pay | Admitting: Osteopathic Medicine

## 2018-05-15 ENCOUNTER — Ambulatory Visit (INDEPENDENT_AMBULATORY_CARE_PROVIDER_SITE_OTHER): Payer: Medicare Other | Admitting: Osteopathic Medicine

## 2018-05-15 ENCOUNTER — Encounter: Payer: Self-pay | Admitting: Osteopathic Medicine

## 2018-05-15 VITALS — BP 139/78 | HR 78 | Temp 98.0°F | Wt 127.5 lb

## 2018-05-15 DIAGNOSIS — R05 Cough: Secondary | ICD-10-CM | POA: Diagnosis not present

## 2018-05-15 DIAGNOSIS — R059 Cough, unspecified: Secondary | ICD-10-CM

## 2018-05-15 DIAGNOSIS — Z8709 Personal history of other diseases of the respiratory system: Secondary | ICD-10-CM | POA: Diagnosis not present

## 2018-05-15 MED ORDER — PROMETHAZINE HCL 6.25 MG/5ML PO SYRP: 12.5000 mg | ORAL_SOLUTION | Freq: Four times a day (QID) | ORAL | 0 refills | Status: DC | PRN

## 2018-05-15 MED ORDER — IPRATROPIUM-ALBUTEROL 0.5-2.5 (3) MG/3ML IN SOLN: 3.0000 mL | Freq: Four times a day (QID) | RESPIRATORY_TRACT | Status: DC

## 2018-05-15 MED ORDER — BENZONATATE 200 MG PO CAPS
200.0000 mg | ORAL_CAPSULE | Freq: Three times a day (TID) | ORAL | 0 refills | Status: DC | PRN
Start: 2018-05-15 — End: 2018-07-02

## 2018-05-15 MED ORDER — IPRATROPIUM-ALBUTEROL 0.5-2.5 (3) MG/3ML IN SOLN: 3.0000 mL | RESPIRATORY_TRACT | 1 refills | Status: DC | PRN

## 2018-05-15 NOTE — Progress Notes (Signed)
HPI: Krista [name] is a 67 y.o. female who  has a past medical history of Arthritis, Bell palsy, Brain aneurysm, Hyperlipidemia, Hypertension, and Osteoporosis.  she presents to [facility] today, 05/15/18,  for chief complaint of:  Cough no better  Seen about 5 days ago for cough lasting for 3 weeks or so at that time.  Was taking Delsym and Mucinex.  Worse at night, affecting sleep, causing rib pain.  Former smoker, previously diagnosed with COPD but not on any inhaler medications.  Chest x-ray showed hyperexpansion of lungs, interstitial markings diffuse coarsening with chronic features.  No pneumonia, edema, or other focal problem.  We opted for treatment acute bronchitis/COPD exacerbation with azithromycin, prednisone 20 mg twice daily x5 days, Tessalon.  Declined inhaler therapy at that time.  Also discussed other causes of persistent cough such as postnasal drip/allergies, GERD.  Had emotional reaction to the prednisone, states "never again," took al azithromycin, took tessalon. Coughing fits w/ sleep and w/ wakening.  She states she has noticed the mucus thin out a good bit but the cough does not seem to be going anywhere.  She is resistant to cough medicines with codeine or hydrocodone and them due to previous reactions.  We did DuoNeb treatment in the office and this resulted in some improvement    Past medical history, surgical history, and family history reviewed.  Current medication list and allergy/intolerance information reviewed.   (See remainder of HPI, ROS, Phys Exam below)  No results found.  No results found for this or any previous visit (from the past 72 hour(s)).   ASSESSMENT/PLAN:   Cough - Weekly post infectious/post viral cough, COPD exacerbation likely.  Patient previously declined inhalers but we will go ahead and do sample of Symbicort now, she has nebulizer as well, so unclear and patient declines repeat chest x-ray which I  think is probably reasonable but if she has symptoms that are not improving or get worse would definitely repeat chest x-ray- Plan: ipratropium-albuterol (DUONEB) 0.5-2.5 (3) MG/3ML nebulizer solution 3 mL  History of COPD -RTC pulmonary function testing when acute illness has resolved.   Meds ordered this encounter  Medications  . ipratropium-albuterol (DUONEB) 0.5-2.5 (3) MG/3ML nebulizer solution 3 mL  . ipratropium-albuterol (DUONEB) 0.5-2.5 (3) MG/3ML SOLN    Sig: Take 3 mLs by nebulization every 2 (two) hours as needed (wheeze, SOB).    Dispense:  60 mL    Refill:  1  . promethazine (PHENERGAN) 6.25 MG/5ML syrup    Sig: Take 10-20 mLs (12.5-25 mg total) by mouth every 6 (six) hours as needed (cough).    Dispense:  240 mL    Refill:  0  . benzonatate (TESSALON) 200 MG capsule    Sig: Take 1 capsule (200 mg total) by mouth 3 (three) times daily as needed for cough.    Dispense:  30 capsule    Refill:  0    Patient Instructions   Will try nebulizer, inhaler  Continue Tessalon   Dextromethorphan (Robitussin, others) - cough suppressant  Guaifenesin (Robitussin, Mucinex, others) - expectorant (helps cough up mucus)  (Dextromethorphan and Guaifenesin also come in a combination tablet)  Lozenges w/ Benzocaine + Menthol (Cepacol)  Honey - as much as you want!  Teas which "coat the throat" - look for ingredients Elm Bark, Licorice Root, Marshmallow Root       Follow-up plan: Return in about 1 month (around 06/12/2018) for lung function test - eval COPD .     ############################################ ############################################ ############################################ ############################################  Outpatient Encounter Medications as of 05/15/2018  Medication Sig Note  . azithromycin (ZITHROMAX) 250 MG tablet 2 tabs po on Day 1, then 1 tab daily Days 2 - 5   . benzonatate (TESSALON) 200 MG capsule Take 1 capsule (200 mg  total) by mouth 3 (three) times daily as needed for cough.   . Calcium-Magnesium-Vitamin D 119-41-740 MG-MG-UNIT TB24 Take by mouth daily.   . Cholecalciferol (VITAMIN D3) 5000 units CAPS Take 5,000 Units by mouth daily.   . hydrochlorothiazide (HYDRODIURIL) 12.5 MG tablet TAKE 1 TABLET BY MOUTH EVERY DAY   . Potassium 95 MG TABS Take 2 tablets (190 mg total) by mouth daily.   . pravastatin (PRAVACHOL) 20 MG tablet Take by mouth. 05/10/2018: As per pt, taking 40 mg daily  . predniSONE (DELTASONE) 20 MG tablet Take 1 tablet (20 mg total) by mouth 2 (two) times daily with a meal.    No facility-administered encounter medications on file as of 05/15/2018.    Allergies  Allergen Reactions  . Codeine Other (See Comments)    Hyperactivity "wired" super hyperactivity    . Kiwi Extract Hives  . Other Other (See Comments), Hives and Rash    Nectarines--tongue swells  . Plum Pulp Hives  . Tomato Hives      Review of Systems:  Constitutional: No recent illness  HEENT: No  headache, no vision change  Cardiac: No  chest pain, No  pressure, No palpitations  Respiratory:  +shortness of breath. +Cough see HPI  Gastrointestinal: No  abdominal pain, no change on bowel habits  Musculoskeletal: No new myalgia/arthralgia  Skin: No  Rash  Hem/Onc: No  easy bruising/bleeding, No  abnormal lumps/bumps  Neurologic: No  weakness, No  Dizziness  Psychiatric: No  concerns with depression, No  concerns with anxiety  Exam:  BP 139/78 (BP Location: Left Arm, Patient Position: Sitting, Cuff Size: Normal)   Pulse 78   Temp 98 F (36.7 C) (Oral)   Wt 127 lb 8 oz (57.8 kg)   SpO2 99%   BMI 21.22 kg/m   Constitutional: VS see above. General Appearance: alert, well-developed, well-nourished, NAD  Eyes: Normal lids and conjunctive, non-icteric sclera  Ears, Nose, Mouth, Throat: MMM, Normal external inspection ears/nares/mouth/lips/gums.  Neck: No masses, trachea midline.   Respiratory: Normal  respiratory effort. no wheeze, no rhonchi, no rales, diminished breath sounds in all lung fields   Cardiovascular: S1/S2 normal, no murmur, no rub/gallop auscultated. RRR.   Musculoskeletal: Gait normal. Symmetric and independent movement of all extremities  Neurological: Normal balance/coordination. No tremor.  Skin: warm, dry, intact.   Psychiatric: Normal judgment/insight. Normal mood and affect. Oriented x3.   Visit summary with medication list and pertinent instructions was printed for patient to review, advised to alert [place] if any changes needed. All questions at time of visit were answered - patient instructed to contact office with any additional concerns. ER/RTC precautions were reviewed with the patient and understanding verbalized.   Follow-up plan: Return in about 1 month (around 06/12/2018) for lung function test - eval COPD .  Note: Total time spent 25 minutes, greater than 50% of the visit was spent face-to-face counseling and coordinating care for the following: The primary encounter diagnosis was Cough. A diagnosis of History of COPD was also pertinent to this visit.Marland Kitchen  Please note: voice recognition software was used to produce this document, and typos may escape review. Please contact Dr. Sheppard Coil for any needed clarifications.

## 2018-05-15 NOTE — Patient Instructions (Addendum)
   Will try nebulizer, inhaler  Continue Tessalon   Dextromethorphan (Robitussin, others) - cough suppressant  Guaifenesin (Robitussin, Mucinex, others) - expectorant (helps cough up mucus)  (Dextromethorphan and Guaifenesin also come in a combination tablet)  Lozenges w/ Benzocaine + Menthol (Cepacol)  Honey - as much as you want!  Teas which "coat the throat" - look for ingredients Elm Bark, Licorice Root, Marshmallow Root

## 2018-07-02 ENCOUNTER — Ambulatory Visit (INDEPENDENT_AMBULATORY_CARE_PROVIDER_SITE_OTHER): Payer: Medicare Other | Admitting: Osteopathic Medicine

## 2018-07-02 ENCOUNTER — Ambulatory Visit (INDEPENDENT_AMBULATORY_CARE_PROVIDER_SITE_OTHER): Payer: Medicare Other

## 2018-07-02 VITALS — BP 130/62 | HR 72 | Ht 65.5 in | Wt 125.0 lb

## 2018-07-02 DIAGNOSIS — M533 Sacrococcygeal disorders, not elsewhere classified: Secondary | ICD-10-CM | POA: Diagnosis not present

## 2018-07-02 DIAGNOSIS — J449 Chronic obstructive pulmonary disease, unspecified: Secondary | ICD-10-CM

## 2018-07-02 DIAGNOSIS — Z13820 Encounter for screening for osteoporosis: Secondary | ICD-10-CM | POA: Diagnosis not present

## 2018-07-02 DIAGNOSIS — M545 Low back pain, unspecified: Secondary | ICD-10-CM

## 2018-07-02 DIAGNOSIS — R05 Cough: Secondary | ICD-10-CM

## 2018-07-02 DIAGNOSIS — M5136 Other intervertebral disc degeneration, lumbar region: Secondary | ICD-10-CM | POA: Diagnosis not present

## 2018-07-02 DIAGNOSIS — R059 Cough, unspecified: Secondary | ICD-10-CM

## 2018-07-02 MED ORDER — ALBUTEROL SULFATE (2.5 MG/3ML) 0.083% IN NEBU
2.5000 mg | INHALATION_SOLUTION | Freq: Once | RESPIRATORY_TRACT | Status: AC
  Administered 2018-07-02: 2.5 mg via RESPIRATORY_TRACT

## 2018-07-02 MED ORDER — OXYCODONE-ACETAMINOPHEN 5-325 MG PO TABS: 1.0000 | ORAL_TABLET | Freq: Four times a day (QID) | ORAL | 0 refills | Status: AC | PRN

## 2018-07-02 MED ORDER — BUDESONIDE-FORMOTEROL FUMARATE 80-4.5 MCG/ACT IN AERO: 2.0000 | INHALATION_SPRAY | Freq: Two times a day (BID) | RESPIRATORY_TRACT | 3 refills | Status: DC

## 2018-07-02 MED ORDER — BUDESONIDE-FORMOTEROL FUMARATE 160-4.5 MCG/ACT IN AERO: 2.0000 | INHALATION_SPRAY | Freq: Two times a day (BID) | RESPIRATORY_TRACT | 3 refills | Status: DC

## 2018-07-02 NOTE — Progress Notes (Signed)
HPI: Krista [name] is a 67 y.o. female who  has a past medical history of Arthritis, Bell palsy, Brain aneurysm, Hyperlipidemia, Hypertension, and Osteoporosis.  she presents to [facility] today, 07/02/18,  for chief complaint of:  Pulmonary function test, history of COPD Low back pain  See scanned documents for PFT results.  Results do demonstrate likely moderate COPD.  Patient reports persistent cough/shortness of breath getting a bit worse over the past couple of months.  Not currently on any maintenance inhaler, she states that she was on Symbicort a while back during acute illness, this may be helped a little bit, would like to maybe get back on this  Back pain: No recent injury but lower back pain is definitely worse over the past weekend or so.  Does not remember any lifting/twisting or popping.  No numbness or weakness down the legs, no new incontinence of urine or stool     Past medical history, surgical history, and family history reviewed.  Current medication list and allergy/intolerance information reviewed.   (See remainder of HPI, ROS, Phys Exam below)  Dg Lumbar Spine 2-3 Views  Result Date: 07/02/2018 CLINICAL DATA:  Low back pain radiating into both hips for 10 days. EXAM: LUMBAR SPINE - 2-3 VIEW COMPARISON:  Lumbar spine radiographs 12/06/2017. FINDINGS: Five lumbar type vertebral bodies. There is mildly progressive disc space narrowing at L3-4 where there is a slight retrolisthesis. There is stable mild disc space narrowing at L2-3. No evidence of acute fracture or pars defect. Multilevel facet degenerative changes are present. There is aortoiliac atherosclerosis. IMPRESSION: Progressive disc space narrowing and retrolisthesis at L3-4. No acute osseous findings. Electronically Signed   By: Richardean Sale M.D.   On: 07/02/2018 17:15   Dg Si Joints  Result Date: 07/02/2018 CLINICAL DATA:  Low back pain radiating into both hips for 10  days. EXAM: BILATERAL SACROILIAC JOINTS - 3+ VIEW COMPARISON:  None. FINDINGS: There are mild sacroiliac degenerative changes bilaterally. No evidence of erosive disease or sacroiliac diastasis. Left-sided lumbosacral assimilation joint and mild lower lumbar spondylosis are noted. IMPRESSION: Mild sacroiliac degenerative changes bilaterally. No acute findings or evidence of sacroiliitis. Electronically Signed   By: Richardean Sale M.D.   On: 07/02/2018 17:15     ASSESSMENT/PLAN:   Cough - Plan: albuterol (PROVENTIL) (2.5 MG/3ML) 0.083% nebulizer solution 2.5 mg, PR EVAL OF BRONCHOSPASM  Moderate COPD (chronic obstructive pulmonary disease) (HCC)  Acute bilateral low back pain without sciatica - Plan: DG Lumbar Spine 2-3 Views, DG Si Joints  Osteoporosis screening - Plan: DG Bone Density   Meds ordered this encounter  Medications  . albuterol (PROVENTIL) (2.5 MG/3ML) 0.083% nebulizer solution 2.5 mg  . DISCONTD: budesonide-formoterol (SYMBICORT) 80-4.5 MCG/ACT inhaler    Sig: Inhale 2 puffs into the lungs 2 (two) times daily.    Dispense:  2 Inhaler    Refill:  3  . oxyCODONE-acetaminophen (PERCOCET/ROXICET) 5-325 MG tablet    Sig: Take 1 tablet by mouth every 6 (six) hours as needed for up to 5 days for severe pain.    Dispense:  15 tablet    Refill:  0  . budesonide-formoterol (SYMBICORT) 160-4.5 MCG/ACT inhaler    Sig: Inhale 2 puffs into the lungs 2 (two) times daily.    Dispense:  1 Inhaler    Refill:  3    Patient Instructions  If back is not better or if it gets worse, I would recommend follow-up with one of our  sports medicine specialists (Dr Georgina Snell or Dr. Patton Salles Dr. Darene Lamer) for further evaluation in 2 weeks. Just call our office and ask for an appointment for sports medicine!     Follow-up plan: Return for recheck breathing in 4 weeks, sooner if needed for back  pain.     ############################################ ############################################ ############################################ ############################################    Outpatient Encounter Medications as of 07/02/2018  Medication Sig Note  . Calcium-Magnesium-Vitamin D 631-49-702 MG-MG-UNIT TB24 Take by mouth daily.   . Cholecalciferol (VITAMIN D3) 5000 units CAPS Take 5,000 Units by mouth daily.   . hydrochlorothiazide (HYDRODIURIL) 12.5 MG tablet TAKE 1 TABLET BY MOUTH EVERY DAY   . ipratropium-albuterol (DUONEB) 0.5-2.5 (3) MG/3ML SOLN Take 3 mLs by nebulization every 2 (two) hours as needed (wheeze, SOB).   . pravastatin (PRAVACHOL) 20 MG tablet Take by mouth. 05/10/2018: As per pt, taking 40 mg daily  . budesonide-formoterol (SYMBICORT) 80-4.5 MCG/ACT inhaler Inhale 2 puffs into the lungs 2 (two) times daily.   . Potassium 95 MG TABS Take 2 tablets (190 mg total) by mouth daily.   . [DISCONTINUED] benzonatate (TESSALON) 200 MG capsule Take 1 capsule (200 mg total) by mouth 3 (three) times daily as needed for cough.   . [DISCONTINUED] promethazine (PHENERGAN) 6.25 MG/5ML syrup Take 10-20 mLs (12.5-25 mg total) by mouth every 6 (six) hours as needed (cough).   . [EXPIRED] albuterol (PROVENTIL) (2.5 MG/3ML) 0.083% nebulizer solution 2.5 mg    . [DISCONTINUED] ipratropium-albuterol (DUONEB) 0.5-2.5 (3) MG/3ML nebulizer solution 3 mL     No facility-administered encounter medications on file as of 07/02/2018.    Allergies  Allergen Reactions  . Codeine Other (See Comments)    Hyperactivity "wired" super hyperactivity    . Kiwi Extract Hives  . Other Other (See Comments), Hives and Rash    Nectarines--tongue swells  . Plum Pulp Hives  . Tomato Hives  . Prednisone     Irritability       Review of Systems:  Constitutional: No recent illness  HEENT: No  headache, no vision change  Cardiac: No  chest pain, No  pressure, No palpitations  Respiratory:   +shortness of breath. +Cough  Gastrointestinal: No  abdominal pain, no change on bowel habits  Musculoskeletal: +new myalgia/arthralgia  Neurologic: No  weakness, No  Dizziness   Exam:  BP 130/62   Pulse 72   Ht 5' 5.5" (1.664 m)   Wt 125 lb (56.7 kg)   SpO2 99%   BMI 20.48 kg/m   Constitutional: VS see above. General Appearance: alert, well-developed, well-nourished, NAD  Eyes: Normal lids and conjunctive, non-icteric sclera  Ears, Nose, Mouth, Throat: MMM, Normal external inspection ears/nares/mouth/lips/gums.  Neck: No masses, trachea midline.   Respiratory: Normal respiratory effort. no wheeze, no rhonchi, no rales, diminished breath sounds bilaterally  Cardiovascular: S1/S2 normal, no murmur, no rub/gallop auscultated. RRR.   Musculoskeletal: Gait normal.  Midline tenderness lower lumbar spine.  Negative straight leg raise bilaterally.  Tender paraspinal lumbar muscle/quadratus lumborum musculature  Neurological: Normal balance/coordination. No tremor.  Skin: warm, dry, intact.   Psychiatric: Normal judgment/insight. Normal mood and affect. Oriented x3.   Visit summary with medication list and pertinent instructions was printed for patient to review, advised to alert [place] if any changes needed. All questions at time of visit were answered - patient instructed to contact office with any additional concerns. ER/RTC precautions were reviewed with the patient and understanding verbalized.   Follow-up plan: Return for recheck breathing in 4 weeks,  sooner if needed for back pain.  Note: Total time spent 25 minutes, greater than 50% of the visit was spent face-to-face counseling and coordinating care for the following: The primary encounter diagnosis was Cough. Diagnoses of Moderate COPD (chronic obstructive pulmonary disease) (HCC), Acute bilateral low back pain without sciatica, and Osteoporosis screening were also pertinent to this visit.Marland Kitchen  Please note: voice recognition  software was used to produce this document, and typos may escape review. Please contact Dr. Sheppard Coil for any needed clarifications.

## 2018-07-02 NOTE — Patient Instructions (Signed)
If back is not better or if it gets worse, I would recommend follow-up with one of our sports medicine specialists (Dr Georgina Snell or Dr. Patton Salles Dr. Darene Lamer) for further evaluation in 2 weeks. Just call our office and ask for an appointment for sports medicine!

## 2018-07-03 ENCOUNTER — Encounter: Payer: Self-pay | Admitting: Osteopathic Medicine

## 2018-07-04 ENCOUNTER — Telehealth: Payer: Self-pay

## 2018-07-04 MED ORDER — BUDESONIDE-FORMOTEROL FUMARATE 160-4.5 MCG/ACT IN AERO
2.0000 | INHALATION_SPRAY | Freq: Two times a day (BID) | RESPIRATORY_TRACT | 3 refills | Status: DC
Start: 2018-07-04 — End: 2018-07-31

## 2018-07-04 NOTE — Telephone Encounter (Signed)
Some medication updated.   1- pt wanted to let Dr Sheppard Coil the pain medications made her "feel bad all over". She has decided she does not need it and she will be discontinuing it.  2- pt is in donut hole and Symbicort RX was $275 at [facility]. Pt has a good RX coupon she wants to use and is requesting that a copy of her Symbicort RX be printed so that she can pick it up Friday and take it to a pharmacy that will let her use good RX (she is having issues with [facility])  Ok to print Symbicort and put up front for pt to pick up?

## 2018-07-04 NOTE — Telephone Encounter (Signed)
Printed, signed, and up front for pt to pick up

## 2018-07-04 NOTE — Telephone Encounter (Signed)
Sure - that's fine

## 2018-07-06 ENCOUNTER — Encounter: Payer: Self-pay | Admitting: Physical Therapy

## 2018-07-06 ENCOUNTER — Ambulatory Visit (INDEPENDENT_AMBULATORY_CARE_PROVIDER_SITE_OTHER): Payer: Medicare Other | Admitting: Physical Therapy

## 2018-07-06 DIAGNOSIS — M545 Low back pain, unspecified: Secondary | ICD-10-CM

## 2018-07-06 NOTE — Therapy (Signed)
[facility] [place], [place], [address] Phone: [phone]   Fax:  [phone]  Physical Therapy Evaluation  Patient Details  Name: Krista [name] MRN: [id number] Date of Birth: 22-Oct-1951 Referring Provider: N. Alexander DO   Encounter Date: 07/06/2018  PT End of Session - 07/06/18 1351    Visit Number  1    Number of Visits  12    Date for PT Re-Evaluation  08/17/18    PT Start Time  1108 pt late    PT Stop Time  1150    PT Time Calculation (min)  42 min    Activity Tolerance  Patient tolerated treatment well    Behavior During Therapy  WFL for tasks assessed/performed       Past Medical History:  Diagnosis Date  . Arthritis   . Bell palsy   . Brain aneurysm   . Hyperlipidemia   . Hypertension   . Osteoporosis     Past Surgical History:  Procedure Laterality Date  . ABDOMINAL HYSTERECTOMY    . CEREBRAL ANEURYSM REPAIR    . EYE SURGERY     cararact  . FOOT SURGERY    . HAND SURGERY    . MASTOIDECTOMY Left     There were no vitals filed for this visit.   Subjective Assessment - 07/06/18 1325    Subjective  Pt relays acute on chronic LBP that just started back up on 06/23/18. She had previous PT in Jan-Feb 2019 for same problem with good results. She also feels unsteady at time, she denies any radiculopathy or leg weakness    Pertinent History  COPD, bells palsy, previous brain aneuryism,     Limitations  Sitting;House hold activities;Lifting;Standing    Diagnostic tests  back/SI x-rayIMPRESSION: Progressive disc space narrowing and retrolisthesis at L3-4. No acute osseous findings Mild sacroiliac degenerative changes bilaterally. No acute findings or evidence of sacroiliitis.     Patient Stated Goals  guit hurting    Currently in Pain?  Yes    Pain Score  7     Pain Location  Back    Pain Orientation  Lower;Right;Left    Pain Descriptors / Indicators  Aching;Tightness    Pain Type  Acute  pain acute on chronic    Pain Onset  1 to 4 weeks ago    Pain Frequency  Constant    Aggravating Factors   sitting, laying, sit to stand    Pain Relieving Factors  TENS sometimes heat and ice         [facility] PT Assessment - 07/06/18 0001      Assessment   Medical Diagnosis  acute on chronic LBP    Referring Provider  N. Alexander DO    Onset Date/Surgical Date  -- one month ago    Next MD Visit  -- end of August    Prior Therapy  PT early this year      Precautions   Precautions  None      Balance Screen   Has the patient fallen in the past 6 months  No      Prior Function   Level of Independence  Independent    Vocation  Retired      Associate Professor   Overall Cognitive Status  Within Functional Limits for tasks assessed      Observation/Other Assessments   Focus on Therapeutic Outcomes (FOTO)   64% limited      Sensation   Light  Touch  Appears Intact      Posture/Postural Control   Posture Comments  increased Thoracic kypohosis, mild scoliosis      ROM / Strength   AROM / PROM / Strength  AROM;Strength      AROM   Overall AROM   Deficits    AROM Assessment Site  Lumbar    Lumbar Flexion  75%    Lumbar Extension  25%    Lumbar - Right Side Bend  50%    Lumbar - Left Side Bend  50%    Lumbar - Right Rotation  50%    Lumbar - Left Rotation  50%      Strength   Overall Strength  Within functional limits for tasks performed      Flexibility   Soft Tissue Assessment /Muscle Length  -- tightness in H.S and lumbar P.S., hip hypomobility       Palpation   Palpation comment  TTP lumbar P.S and glutes bilat      Special Tests   Other special tests  +slump, SLR, FABERs,                 Objective measurements completed on examination: See above findings.      [facility] [place] Adult PT Treatment/Exercise - 07/06/18 0001      Exercises   Exercises  Lumbar      Modalities   Modalities  Moist Heat;Electrical Stimulation      Moist Heat Therapy   Number Minutes  Moist Heat  15 Minutes with TENS    Moist Heat Location  Lumbar Spine      Electrical Stimulation   Electrical Stimulation Location  lumbar    Electrical Stimulation Action  TENS    Electrical Stimulation Parameters  tolerance    Electrical Stimulation Goals  Pain;Tone             PT Education - 07/06/18 1351    Education Details  HEP, POC    Person(s) Educated  Patient    Methods  Explanation;Demonstration;Verbal cues;Handout    Comprehension  Verbalized understanding;Need further instruction          PT Long Term Goals - 07/06/18 1406      PT LONG TERM GOAL #1   Title  improve tissue extensibility with patient to demonstrated increased mobility and ROM to [facility]. 6 weeks 08/17/18    Status  New      PT LONG TERM GOAL #2   Title  Decrease pain by 50-70% allowing patient to perform functional activities with greater ease. 6 weeks 08/17/18      PT LONG TERM GOAL #3   Title  Pt will be albe to sleep without pain or discomfort.  6 weeks 08/17/18      PT LONG TERM GOAL #4   Title  Independent in HEP. 6 weeks 08/17/18      PT LONG TERM GOAL #5   Title  Improve FOTO to </= 46% limitation 6 weeks 08/17/18             Plan - 07/06/18 1352    Clinical Impression Statement  Pt presents with acute on chronic LBP/strain with increased paraspinal tension/spasm. She also has signs of hip OA. She preferred flexion based exercise today but had positive SLR test. She was given gentle stretching and core strengthening HEP to begin and she will benefit from PT to address her deficits and decrease pain.    Clinical Presentation  Stable    Clinical  Decision Making  Moderate    Rehab Potential  Good    Clinical Impairments Affecting Rehab Potential  chronic nature of pain with lumbar degenerative changes    PT Frequency  2x / week    PT Duration  6 weeks    PT Treatment/Interventions  ADLs/Self Care Home Management;Cryotherapy;Electrical Stimulation;Moist  Heat;Traction;Ultrasound;Therapeutic exercise;Balance training;Neuromuscular re-education;Patient/family education;Manual techniques;Passive range of motion;Dry needling    PT Next Visit Plan  review HEP, progress as able, modalities PRN       Patient will benefit from skilled therapeutic intervention in order to improve the following deficits and impairments:  Decreased activity tolerance, Decreased range of motion, Decreased strength, Hypomobility, Difficulty walking, Pain, Increased muscle spasms, Postural dysfunction  Visit Diagnosis: Acute bilateral low back pain without sciatica - Plan: PT plan of care cert/re-cert     Problem List Patient Active Problem List   Diagnosis Date Noted  . Acute bronchitis with COPD ([place]) 05/10/2018  . Impacted cerumen of left ear 03/07/2018  . History of left mastoidectomy 03/07/2018  . Tympanosclerosis, left ear 03/07/2018  . Cardiac risk counseling 09/05/2017  . Essential hypertension 09/05/2017  . Mixed hyperlipidemia 09/05/2017  . History of Bell's palsy 08/15/2017  . History of osteoporosis 08/15/2017  . History of colon polyps 08/15/2017  . Diarrhea 08/15/2017  . History of nonmelanoma skin cancer 08/15/2017  . Brain aneurysm 08/15/2017  . Bilateral hearing loss 08/15/2017  . H/O hysterectomy for benign disease 08/15/2017    Debbe Odea, PT, DPT 07/06/2018, 2:18 PM  [facility] [place], [place], [address] Phone: [phone]   Fax:  [phone]  Name: Krista [name] MRN: [id number] Date of Birth: 1951-04-10

## 2018-07-09 ENCOUNTER — Ambulatory Visit (INDEPENDENT_AMBULATORY_CARE_PROVIDER_SITE_OTHER): Payer: Medicare Other | Admitting: Physical Therapy

## 2018-07-09 DIAGNOSIS — M6281 Muscle weakness (generalized): Secondary | ICD-10-CM

## 2018-07-09 DIAGNOSIS — M545 Low back pain, unspecified: Secondary | ICD-10-CM

## 2018-07-09 DIAGNOSIS — R29898 Other symptoms and signs involving the musculoskeletal system: Secondary | ICD-10-CM

## 2018-07-09 NOTE — Therapy (Signed)
[facility] [place], [place], [address] Phone: [phone]   Fax:  [phone]  Physical Therapy Treatment  Patient Details  Name: Krista [name] MRN: [id number] Date of Birth: Aug 21, 1951 Referring Provider: N. Alexander DO   Encounter Date: 07/09/2018  PT End of Session - 07/09/18 1439    Visit Number  2    Number of Visits  12    Date for PT Re-Evaluation  08/17/18    PT Start Time  1401    PT Stop Time  1435    PT Time Calculation (min)  34 min    Activity Tolerance  Patient limited by pain    Behavior During Therapy  [facility] for tasks assessed/performed       Past Medical History:  Diagnosis Date  . Arthritis   . Bell palsy   . Brain aneurysm   . Hyperlipidemia   . Hypertension   . Osteoporosis     Past Surgical History:  Procedure Laterality Date  . ABDOMINAL HYSTERECTOMY    . CEREBRAL ANEURYSM REPAIR    . EYE SURGERY     cararact  . FOOT SURGERY    . HAND SURGERY    . MASTOIDECTOMY Left     There were no vitals filed for this visit.  Subjective Assessment - 07/09/18 1405    Subjective  Pt reports she has some questions regarding her exercises, "I'm not sure if I'm doing them right".  She has been using her TENS unit and ice/heat.  She thinks the Rock tape has helped some.     Patient Stated Goals  quit hurting    Currently in Pain?  Yes    Pain Score  7     Pain Location  Back    Pain Orientation  Left;Right;Lower         [facility] PT Assessment - 07/09/18 0001      Palpation   SI assessment   Lt ASIS higher than Rt; Rt sacral torsion; Lt PSIS (tender) lowre than Rt.        [place] Adult PT Treatment/Exercise - 07/09/18 0001      Self-Care   Self-Care  Other Self-Care Comments    Other Self-Care Comments   pt educated on self massage/ MFR with ball to psoas - shown 3 ways, pt returned demo wiht cues for standing version.       Lumbar Exercises: Stretches   Passive Hamstring  Stretch  Right;Left;2 reps;30 seconds opp knee bent, slight bend in knee     Single Knee to Chest Stretch  -- held    Lower Trunk Rotation  2 reps increased pain; held in [employer]  1 rep poor tolerance    Piriformis Stretch  Right;Left;1 rep painful each side, (left pain in psoas)      Lumbar Exercises: Supine   Ab Set  -- 1 rep; good contraction    Bridge  10 reps limited lift of hips due to increase in pain.       Modalities   Modalities  -- pt declined; will use at home.       Manual Therapy   Manual Therapy  Muscle Energy Technique    Manual therapy comments  Rock tape removed    Muscle Energy Technique  MET to correct posterior rotated Lt ilium, MET to correct Rt sacral torsion (pt very guarded).  PT Long Term Goals - 07/06/18 1406      PT LONG TERM GOAL #1   Title  improve tissue extensibility with patient to demonstrated increased mobility and ROM to [facility]. 6 weeks 08/17/18    Status  New      PT LONG TERM GOAL #2   Title  Decrease pain by 50-70% allowing patient to perform functional activities with greater ease. 6 weeks 08/17/18      PT LONG TERM GOAL #3   Title  Pt will be albe to sleep without pain or discomfort.  6 weeks 08/17/18      PT LONG TERM GOAL #4   Title  Independent in HEP. 6 weeks 08/17/18      PT LONG TERM GOAL #5   Title  Improve FOTO to </= 46% limitation 6 weeks 08/17/18            Plan - 07/09/18 1717    Clinical Impression Statement  Pt presents with pelvis asymmetries similar to previous episode of care.  Slight improvement today with MET corrections.  Pt had trouble tolerating HEP exercises; modified exercises for now to improve tolerance. Pt very point tender in Lt psoas; may benefit from manual therapy to this and Lt hip/low back.  She reported relief from [facility] tape applied to back, however was  very sensitive with removal - may need to use sensitive skin Rock tape next application.     Rehab Potential  Good     Clinical Impairments Affecting Rehab Potential  chronic nature of pain with lumbar degenerative changes    PT Frequency  2x / week    PT Duration  6 weeks    PT Treatment/Interventions  ADLs/Self Care Home Management;Cryotherapy;Electrical Stimulation;Moist Heat;Traction;Ultrasound;Therapeutic exercise;Balance training;Neuromuscular re-education;Patient/family education;Manual techniques;Passive range of motion;Dry needling    PT Next Visit Plan  manual therapy to Lt QL/psoas.   modify HEP as needed.        Patient will benefit from skilled therapeutic intervention in order to improve the following deficits and impairments:  Decreased activity tolerance, Decreased range of motion, Decreased strength, Hypomobility, Difficulty walking, Pain, Increased muscle spasms, Postural dysfunction  Visit Diagnosis: Acute bilateral low back pain without sciatica  Acute midline low back pain, with sciatica presence unspecified  Other symptoms and signs involving the musculoskeletal system  Muscle weakness (generalized)     Problem List Patient Active Problem List   Diagnosis Date Noted  . Acute bronchitis with COPD ([place]) 05/10/2018  . Impacted cerumen of left ear 03/07/2018  . History of left mastoidectomy 03/07/2018  . Tympanosclerosis, left ear 03/07/2018  . Cardiac risk counseling 09/05/2017  . Essential hypertension 09/05/2017  . Mixed hyperlipidemia 09/05/2017  . History of Bell's palsy 08/15/2017  . History of osteoporosis 08/15/2017  . History of colon polyps 08/15/2017  . Diarrhea 08/15/2017  . History of nonmelanoma skin cancer 08/15/2017  . Brain aneurysm 08/15/2017  . Bilateral hearing loss 08/15/2017  . H/O hysterectomy for benign disease 08/15/2017   Kerin Perna, PTA 07/09/18 5:26 PM  [facility] [place], [place], [address] Phone: [phone]   Fax:  [phone]  Name: Carolyn Sylvia MRN: [id number] Date of Birth: [birth year]

## 2018-07-11 ENCOUNTER — Ambulatory Visit (INDEPENDENT_AMBULATORY_CARE_PROVIDER_SITE_OTHER): Payer: Medicare Other

## 2018-07-11 DIAGNOSIS — M810 Age-related osteoporosis without current pathological fracture: Secondary | ICD-10-CM

## 2018-07-11 DIAGNOSIS — Z780 Asymptomatic menopausal state: Secondary | ICD-10-CM | POA: Diagnosis not present

## 2018-07-11 DIAGNOSIS — Z13820 Encounter for screening for osteoporosis: Secondary | ICD-10-CM

## 2018-07-12 ENCOUNTER — Encounter: Payer: Self-pay | Admitting: Osteopathic Medicine

## 2018-07-12 ENCOUNTER — Ambulatory Visit (INDEPENDENT_AMBULATORY_CARE_PROVIDER_SITE_OTHER): Payer: Medicare Other | Admitting: Rehabilitative and Restorative Service Providers"

## 2018-07-12 ENCOUNTER — Encounter: Payer: Self-pay | Admitting: Rehabilitative and Restorative Service Providers"

## 2018-07-12 DIAGNOSIS — R29898 Other symptoms and signs involving the musculoskeletal system: Secondary | ICD-10-CM | POA: Diagnosis not present

## 2018-07-12 DIAGNOSIS — M6281 Muscle weakness (generalized): Secondary | ICD-10-CM

## 2018-07-12 DIAGNOSIS — M545 Low back pain, unspecified: Secondary | ICD-10-CM

## 2018-07-12 MED ORDER — CYCLOBENZAPRINE HCL 10 MG PO TABS: 5.0000 mg | ORAL_TABLET | Freq: Three times a day (TID) | ORAL | 1 refills | Status: DC | PRN

## 2018-07-12 NOTE — Therapy (Addendum)
[facility] [place], [place], [address] Phone: [phone]   Fax:  [phone]  Physical Therapy Treatment  Patient Details  Name: Krista [name]: [id number] Date of Birth: 04/10/1951 Referring Provider: N. Alexander DO   Encounter Date: 07/12/2018  PT End of Session - 07/12/18 1107    Visit Number  3    Number of Visits  12    Date for PT Re-Evaluation  08/17/18    PT Start Time  4259    PT Stop Time  5638    PT Time Calculation (min)  47 min       Past Medical History:  Diagnosis Date  . Arthritis   . Bell palsy   . Brain aneurysm   . Hyperlipidemia   . Hypertension   . Osteoporosis     Past Surgical History:  Procedure Laterality Date  . ABDOMINAL HYSTERECTOMY    . CEREBRAL ANEURYSM REPAIR    . EYE SURGERY     cararact  . FOOT SURGERY    . HAND SURGERY    . MASTOIDECTOMY Left     There were no vitals filed for this visit.  Subjective Assessment - 07/12/18 1107    Subjective  Patient reports that her Lt hip pain is still a 7-8/10 and is worse in the mornings with walking. Doing exercises with some increase in the Lt LE pain -  no trouble and using the TENS unit.  She was awake until 5 am following last pT appointment. States she will contact MD re pain management possibly trying muscle relaxant     Currently in Pain?  Yes    Pain Score  8     Pain Location  Back    Pain Orientation  Left;Lower    Pain Descriptors / Indicators  Aching;Tightness    Pain Type  Acute pain    Pain Radiating Towards  anterior Lt hip and into the lateral left leg     Pain Onset  1 to 4 weeks ago    Pain Frequency  Constant                       [facility] Adult PT Treatment/Exercise - 07/12/18 0001      Cryotherapy   Number Minutes Cryotherapy  15 Minutes    Cryotherapy Location  Lumbar Spine;Hip    Type of Cryotherapy  Ice pack      Electrical Stimulation   Electrical Stimulation  Location  lumbar/anterior lateral hip area     Electrical Stimulation Action  IFC    Electrical Stimulation Parameters  to tolerance    Electrical Stimulation Goals  Pain;Tone      Manual Therapy   Soft tissue mobilization  gentle soft tissue mobilization to pt tolerance working on Lt psoas; transverse abdominals; lats; QL; lumbar paraspinals; posterior hip     Myofascial Release  anterior abdominal to lateral trunk area through the area of psoas and lateral trunk     Kinesiotex  --             PT Education - 07/12/18 1147    Education Details  encouraged pt to contact MD for suggestions on medical management of pain and muscle tightness/spasm    Person(s) Educated  Patient    Methods  Explanation    Comprehension  Verbalized understanding          PT Long Term Goals - 07/12/18 1143  PT LONG TERM GOAL #1   Title  improve tissue extensibility with patient to demonstrated increased mobility and ROM to [facility]. 6 weeks 08/17/18    Time  8    Period  Weeks    Status  On-going      PT LONG TERM GOAL #2   Title  Decrease pain by 50-70% allowing patient to perform functional activities with greater ease. 6 weeks 08/17/18    Time  8    Period  Weeks    Status  On-going      PT LONG TERM GOAL #3   Title  Pt will be albe to sleep without pain or discomfort.  6 weeks 08/17/18    Time  8    Period  Weeks    Status  On-going      PT LONG TERM GOAL #4   Title  Independent in HEP. 6 weeks 08/17/18    Time  8    Period  Weeks    Status  On-going      PT LONG TERM GOAL #5   Title  Improve FOTO to </= 46% limitation 6 weeks 08/17/18    Time  8    Period  Weeks    Status  On-going            Plan - 07/12/18 1123    Clinical Impression Statement  Patient continues to have significant pain limiting exercise tolerance. She has increased pain following last PT visit and was unable to sleep until 5 am. Patient has limited tolerance for manual work with significant muscular  tightness noted through the Lt hemipelvis and LB including psoas; hip flexors; transverse abdominals; lats; piriformis; gluts; LB. Patient was encouraged to contact MD re- medication. She states that she is unable to take pain med d/t side effects.     Rehab Potential  Good    PT Frequency  2x / week    PT Duration  6 weeks    PT Treatment/Interventions  ADLs/Self Care Home Management;Cryotherapy;Electrical Stimulation;Moist Heat;Traction;Ultrasound;Therapeutic exercise;Balance training;Neuromuscular re-education;Patient/family education;Manual techniques;Passive range of motion;Dry needling    PT Next Visit Plan  manual therapy to Lt QL/psoas.   modify HEP as needed.     Consulted and Agree with Plan of Care  Patient       Patient will benefit from skilled therapeutic intervention in order to improve the following deficits and impairments:  Decreased activity tolerance, Decreased range of motion, Decreased strength, Hypomobility, Difficulty walking, Pain, Increased muscle spasms, Postural dysfunction  Visit Diagnosis: Acute bilateral low back pain without sciatica  Acute midline low back pain, with sciatica presence unspecified  Other symptoms and signs involving the musculoskeletal system  Muscle weakness (generalized)     Problem List Patient Active Problem List   Diagnosis Date Noted  . Acute bronchitis with COPD ([place]) 05/10/2018  . Impacted cerumen of left ear 03/07/2018  . History of left mastoidectomy 03/07/2018  . Tympanosclerosis, left ear 03/07/2018  . Cardiac risk counseling 09/05/2017  . Essential hypertension 09/05/2017  . Mixed hyperlipidemia 09/05/2017  . History of Bell's palsy 08/15/2017  . History of osteoporosis 08/15/2017  . History of colon polyps 08/15/2017  . Diarrhea 08/15/2017  . History of nonmelanoma skin cancer 08/15/2017  . Brain aneurysm 08/15/2017  . Bilateral hearing loss 08/15/2017  . H/O hysterectomy for benign disease 08/15/2017    [name]  Nilda Simmer PT, MPH  07/12/2018, 12:59 PM  [address]  [place], [place], [address] Phone: [phone]   Fax:  [phone]  Name: Krista [name]: [id number] Date of Birth: [birth year]

## 2018-07-18 ENCOUNTER — Encounter: Payer: Self-pay | Admitting: Rehabilitative and Restorative Service Providers"

## 2018-07-18 ENCOUNTER — Ambulatory Visit (INDEPENDENT_AMBULATORY_CARE_PROVIDER_SITE_OTHER): Payer: Medicare Other | Admitting: Rehabilitative and Restorative Service Providers"

## 2018-07-18 DIAGNOSIS — M6281 Muscle weakness (generalized): Secondary | ICD-10-CM | POA: Diagnosis not present

## 2018-07-18 DIAGNOSIS — M545 Low back pain, unspecified: Secondary | ICD-10-CM

## 2018-07-18 DIAGNOSIS — R29898 Other symptoms and signs involving the musculoskeletal system: Secondary | ICD-10-CM

## 2018-07-18 NOTE — Patient Instructions (Addendum)
SUPINE Tips A    Being in the supine position means to be lying on the back. Lying on the back is the position of least compression on the bones and discs of the spine, and helps to re-align the natural curves of the back.  Press arms into the table or bed hold 5 sec x 10 reps  Thoracic Lift    Press shoulders down. Then lift mid-thoracic spine (area between the shoulder blades). Lift the breastbone slightly. Hold ___ seconds. Relax. Repeat ___ times.   Leg Press: Single    Straighten one leg down to floor. Bring toes AND forefoot toward knee, extend heel. Press leg down. DO NOT BEND KNEE. Hold __5_ seconds. Relax leg. Repeat exercise 10 time. Relax leg. Re-bend knee. Repeat with other leg.

## 2018-07-18 NOTE — Therapy (Signed)
[place], [place], [address] Phone: [phone]   Fax:  [phone]  Physical Therapy Treatment  Patient Details  Name: Krista [name] MRN: [id number] Date of Birth: 1951/10/22 Referring Provider: Dr Sheppard Coil    Encounter Date: 07/18/2018  PT End of Session - 07/18/18 1101    Visit Number  4    Number of Visits  12    Date for PT Re-Evaluation  08/17/18    PT Start Time  1100    PT Stop Time  1154    PT Time Calculation (min)  54 min    Activity Tolerance  Patient tolerated treatment well       Past Medical History:  Diagnosis Date  . Arthritis   . Bell palsy   . Brain aneurysm   . Hyperlipidemia   . Hypertension   . Osteoporosis     Past Surgical History:  Procedure Laterality Date  . ABDOMINAL HYSTERECTOMY    . CEREBRAL ANEURYSM REPAIR    . EYE SURGERY     cararact  . FOOT SURGERY    . HAND SURGERY    . MASTOIDECTOMY Left     There were no vitals filed for this visit.  Subjective Assessment - 07/18/18 1101    Subjective  Patient reports that she started a muscle ralaxant Friday and that seems to help a little. She has less pai in the anterio/lateral Lt hip but continued pain in the low back at the waist line. She is not sleeping well at all. She went to the beach for 4 days and noticed increased pain with sitting and riding.     Currently in Pain?  Yes    Pain Score  7     Pain Location  Back    Pain Orientation  Left;Lower    Pain Descriptors / Indicators  Aching;Tightness;Throbbing    Pain Type  Acute pain    Pain Onset  1 to 4 weeks ago    Pain Frequency  Constant    Aggravating Factors   prolonged sitting; lying down; sit to stand     Pain Relieving Factors  TENs; heat; ice help some          [facility] PT Assessment - 07/18/18 0001      Assessment   Medical Diagnosis  acute onset of chronic LBP    Referring Provider  Dr Sheppard Coil     Onset Date/Surgical Date   06/04/18    Hand Dominance  Right    Next MD Visit  8/19    Prior Therapy  PT early this year      Palpation   Palpation comment  significant muscular tightness with tenderness to light palpation through the Lt psoas/hip flexors; transverse abdominals; lats; QL; lumbar paraspinal musculature       Transfers   Comments  difficulty with all transfers and transitioinal movements                    [facility] Adult PT Treatment/Exercise - 07/18/18 0001      Lumbar Exercises: Supine   Other Supine Lumbar Exercises  3 part core 10 sec x 10 reps; LE extension pressing into the table 5 sec x 10 reps    Other Supine Lumbar Exercises  LE extension with opposite knee bent 5 sec x 10 each LE core engaged       Moist Heat Therapy   Number Minutes Moist Heat  20 Minutes  Moist Heat Location  Lumbar Spine   Lt side      Electrical Stimulation   Electrical Stimulation Location  lumbar/Lt lateral trunk area     Electrical Stimulation Action  IFC    Electrical Stimulation Parameters  to tolerance    Electrical Stimulation Goals  Pain;Tone      Manual Therapy   Manual therapy comments  pt prone and supine     Soft tissue mobilization  gentle soft tissue mobilization to pt tolerance working on Lt psoas; transverse abdominals; lats; QL; lumbar paraspinals; posterior hip     Myofascial Release  anterior abdominal to lateral trunk area through the area of psoas and lateral trunk              PT Education - 07/18/18 1123    Education Details  HEP     Person(s) Educated  Patient    Methods  Explanation;Tactile cues;Demonstration;Verbal cues;Handout    Comprehension  Verbalized understanding;Returned demonstration;Verbal cues required;Tactile cues required          PT Long Term Goals - 07/18/18 1139      PT LONG TERM GOAL #1   Title  improve tissue extensibility with patient to demonstrated increased mobility and ROM to [facility]. 6 weeks 08/17/18    Time  8    Period  Weeks     Status  On-going      PT LONG TERM GOAL #2   Title  Decrease pain by 50-70% allowing patient to perform functional activities with greater ease. 6 weeks 08/17/18    Time  8    Period  Weeks    Status  On-going      PT LONG TERM GOAL #3   Title  Pt will be albe to sleep without pain or discomfort.  6 weeks 08/17/18    Time  8    Period  Weeks    Status  On-going      PT LONG TERM GOAL #4   Title  Independent in HEP. 6 weeks 08/17/18    Time  8    Period  Weeks    Status  On-going      PT LONG TERM GOAL #5   Title  Improve FOTO to </= 46% limitation 6 weeks 08/17/18    Time  8    Period  Weeks    Status  On-going            Plan - 07/18/18 1157    Clinical Impression Statement  Patient reports some improvement in the anterior Lt hip pain but persistent pain in the Lt side and low back with pain at the waist. She continues to have sensitivity and tightness/tenderness to palpation through the Lt psoas/transverse abdominals/lats/lumbar musculature. Patient tolerated slightly more pressure with manual work today and some exercises in supine. Discussed the possibility of RTD to discuss additional pain management measures.    History and Personal Factors relevant to plan of care:  dexascan showed progression of osteoporesis     Rehab Potential  Good    Clinical Impairments Affecting Rehab Potential  chronic nature of pain with lumbar degenerative changes    PT Frequency  2x / week    PT Duration  6 weeks    PT Treatment/Interventions  ADLs/Self Care Home Management;Cryotherapy;Electrical Stimulation;Moist Heat;Traction;Ultrasound;Therapeutic exercise;Balance training;Neuromuscular re-education;Patient/family education;Manual techniques;Passive range of motion;Dry needling    PT Next Visit Plan  manual therapy to Lt QL/psoas as tolerated.   modify HEP as needed.  Consulted and Agree with Plan of Care  Patient       Patient will benefit from skilled therapeutic intervention in  order to improve the following deficits and impairments:  Decreased activity tolerance, Decreased range of motion, Decreased strength, Hypomobility, Difficulty walking, Pain, Increased muscle spasms, Postural dysfunction  Visit Diagnosis: Acute bilateral low back pain without sciatica  Acute midline low back pain, with sciatica presence unspecified  Other symptoms and signs involving the musculoskeletal system  Muscle weakness (generalized)     Problem List Patient Active Problem List   Diagnosis Date Noted  . Acute bronchitis with COPD ([place]) 05/10/2018  . Impacted cerumen of left ear 03/07/2018  . History of left mastoidectomy 03/07/2018  . Tympanosclerosis, left ear 03/07/2018  . Cardiac risk counseling 09/05/2017  . Essential hypertension 09/05/2017  . Mixed hyperlipidemia 09/05/2017  . History of Bell's palsy 08/15/2017  . History of osteoporosis 08/15/2017  . History of colon polyps 08/15/2017  . Diarrhea 08/15/2017  . History of nonmelanoma skin cancer 08/15/2017  . Brain aneurysm 08/15/2017  . Bilateral hearing loss 08/15/2017  . H/O hysterectomy for benign disease 08/15/2017    Kimyah Frein Nilda Simmer PT, MPH  07/18/2018, 12:03 PM  [facility] [place], [place], [address] Phone: [phone]   Fax:  [phone]  Name: Krista [name] MRN: [id number] Date of Birth: 1951/05/04

## 2018-07-19 MED ORDER — ALENDRONATE SODIUM 70 MG PO TABS: 70.0000 mg | ORAL_TABLET | ORAL | 3 refills | Status: DC

## 2018-07-19 NOTE — Progress Notes (Unsigned)
Sent!

## 2018-07-20 ENCOUNTER — Ambulatory Visit (INDEPENDENT_AMBULATORY_CARE_PROVIDER_SITE_OTHER): Payer: Medicare Other | Admitting: Physical Therapy

## 2018-07-20 DIAGNOSIS — M545 Low back pain, unspecified: Secondary | ICD-10-CM

## 2018-07-20 NOTE — Therapy (Signed)
[place], [place], [address] Phone: [phone]   Fax:  [phone]  Physical Therapy Treatment  Patient Details  Name: Krista [name] MRN: [id number] Date of Birth: January 17, 1951 Referring Provider: Dr Sheppard Coil    Encounter Date: 07/20/2018  PT End of Session - 07/20/18 1215    Visit Number  5    Number of Visits  12    Date for PT Re-Evaluation  08/17/18    PT Start Time  1100    PT Stop Time  1150    PT Time Calculation (min)  50 min    Activity Tolerance  Patient tolerated treatment well    Behavior During Therapy  [facility] for tasks assessed/performed       Past Medical History:  Diagnosis Date  . Arthritis   . Bell palsy   . Brain aneurysm   . Hyperlipidemia   . Hypertension   . Osteoporosis     Past Surgical History:  Procedure Laterality Date  . ABDOMINAL HYSTERECTOMY    . CEREBRAL ANEURYSM REPAIR    . EYE SURGERY     cararact  . FOOT SURGERY    . HAND SURGERY    . MASTOIDECTOMY Left     There were no vitals filed for this visit.  Subjective Assessment - 07/20/18 1209    Subjective  Pt relays she is sleeping some better after taking muscle relaxers but her pain continues to be high in back and lt hip.    Currently in Pain?  Yes    Pain Score  7     Pain Location  Back    Pain Orientation  Left    Pain Descriptors / Indicators  Aching    Pain Type  Acute pain    Pain Onset  1 to 4 weeks ago                       [facility] Adult PT Treatment/Exercise - 07/20/18 0001      Exercises   Exercises  Lumbar      Lumbar Exercises: Stretches   Passive Hamstring Stretch  Left;3 reps;30 seconds;Right    Single Knee to Chest Stretch  Right;Left;2 reps;20 seconds    Lower Trunk Rotation  5 reps   5 sec hold bilat   Piriformis Stretch  Right;Left;3 reps;30 seconds      Lumbar Exercises: Supine   Other Supine Lumbar Exercises  3 part core 10 sec x 10 reps; LE extension  pressing into the table 5 sec x 10 reps      Modalities   Modalities  Electrical Stimulation;Moist Heat;Ultrasound      Moist Heat Therapy   Number Minutes Moist Heat  15 Minutes   with TENS pre tx   Moist Heat Location  Lumbar Spine      Electrical Stimulation   Electrical Stimulation Location  lumbar/Lt lateral trunk area     Electrical Stimulation Action  IFC    Electrical Stimulation Parameters  tolerance    Electrical Stimulation Goals  Pain;Tone      Ultrasound   Ultrasound Location  Lt lumbar/hip    Ultrasound Parameters  1.0, 1.0, 100% 8 min    Ultrasound Goals  Pain                  PT Long Term Goals - 07/18/18 1139      PT LONG TERM GOAL #1   Title  improve  tissue extensibility with patient to demonstrated increased mobility and ROM to [facility]. 6 weeks 08/17/18    Time  8    Period  Weeks    Status  On-going      PT LONG TERM GOAL #2   Title  Decrease pain by 50-70% allowing patient to perform functional activities with greater ease. 6 weeks 08/17/18    Time  8    Period  Weeks    Status  On-going      PT LONG TERM GOAL #3   Title  Pt will be albe to sleep without pain or discomfort.  6 weeks 08/17/18    Time  8    Period  Weeks    Status  On-going      PT LONG TERM GOAL #4   Title  Independent in HEP. 6 weeks 08/17/18    Time  8    Period  Weeks    Status  On-going      PT LONG TERM GOAL #5   Title  Improve FOTO to </= 46% limitation 6 weeks 08/17/18    Time  8    Period  Weeks    Status  On-going            Plan - 07/20/18 1215    Clinical Impression Statement  Pt continues to have high pain levels and difficulty with mobility from supine<>sit due to pain. She was treated with gentle stretching program, gentle hip mobs grade 1, MHP with E-stim, and trial of U.S to left lumbar/hip all to decrease pain and inflammaiton. She is not able to progress due to pain and if no improvments in next 2 sessions PT recommending referral back to MD.     Rehab Potential  Fair    Clinical Impairments Affecting Rehab Potential  chronic nature of pain with lumbar degenerative changes    PT Frequency  2x / week    PT Duration  6 weeks    PT Treatment/Interventions  ADLs/Self Care Home Management;Cryotherapy;Electrical Stimulation;Moist Heat;Traction;Ultrasound;Therapeutic exercise;Balance training;Neuromuscular re-education;Patient/family education;Manual techniques;Passive range of motion;Dry needling    PT Next Visit Plan  assess response to U.S ,manual therapy to Lt QL/psoas as tolerated.   modify HEP as needed.     Consulted and Agree with Plan of Care  Patient       Patient will benefit from skilled therapeutic intervention in order to improve the following deficits and impairments:  Decreased activity tolerance, Decreased range of motion, Decreased strength, Hypomobility, Difficulty walking, Pain, Increased muscle spasms, Postural dysfunction  Visit Diagnosis: Acute bilateral low back pain without sciatica  Acute midline low back pain, with sciatica presence unspecified     Problem List Patient Active Problem List   Diagnosis Date Noted  . Acute bronchitis with COPD ([place]) 05/10/2018  . Impacted cerumen of left ear 03/07/2018  . History of left mastoidectomy 03/07/2018  . Tympanosclerosis, left ear 03/07/2018  . Cardiac risk counseling 09/05/2017  . Essential hypertension 09/05/2017  . Mixed hyperlipidemia 09/05/2017  . History of Bell's palsy 08/15/2017  . History of osteoporosis 08/15/2017  . History of colon polyps 08/15/2017  . Diarrhea 08/15/2017  . History of nonmelanoma skin cancer 08/15/2017  . Brain aneurysm 08/15/2017  . Bilateral hearing loss 08/15/2017  . H/O hysterectomy for benign disease 08/15/2017    Debbe Odea, PT, DPT 07/20/2018, 12:19 PM  [facility] [place], [place], [address] Phone: [phone]   Fax:  [phone]  Name:  Gladie Gravette MRN: [id number] Date of Birth: 08/14/1951

## 2018-07-26 ENCOUNTER — Ambulatory Visit (INDEPENDENT_AMBULATORY_CARE_PROVIDER_SITE_OTHER): Payer: Medicare Other | Admitting: Rehabilitative and Restorative Service Providers"

## 2018-07-26 ENCOUNTER — Encounter: Payer: Self-pay | Admitting: Rehabilitative and Restorative Service Providers"

## 2018-07-26 DIAGNOSIS — M6281 Muscle weakness (generalized): Secondary | ICD-10-CM

## 2018-07-26 DIAGNOSIS — M545 Low back pain, unspecified: Secondary | ICD-10-CM

## 2018-07-26 DIAGNOSIS — R29898 Other symptoms and signs involving the musculoskeletal system: Secondary | ICD-10-CM

## 2018-07-26 NOTE — Therapy (Signed)
[place], [place], [address] Phone: [phone]   Fax:  [phone]  Physical Therapy Treatment  Patient Details  Name: Krista [name] MRN: [id number] Date of Birth: [birth year] Referring Provider: Dr Sheppard Coil   Encounter Date: 07/26/2018  PT End of Session - 07/26/18 1403    Visit Number  6    Number of Visits  12    Date for PT Re-Evaluation  08/17/18    PT Start Time  1402    PT Stop Time  1500    PT Time Calculation (min)  58 min    Activity Tolerance  Patient tolerated treatment well       Past Medical History:  Diagnosis Date  . Arthritis   . Bell palsy   . Brain aneurysm   . Hyperlipidemia   . Hypertension   . Osteoporosis     Past Surgical History:  Procedure Laterality Date  . ABDOMINAL HYSTERECTOMY    . CEREBRAL ANEURYSM REPAIR    . EYE SURGERY     cararact  . FOOT SURGERY    . HAND SURGERY    . MASTOIDECTOMY Left     There were no vitals filed for this visit.  Subjective Assessment - 07/26/18 1404    Subjective  Patient reports that her husband came through surgery well but has had some problems with his blood pressure. She has done some of her exercises as she has time. Patient feels she has made a little progress and is having some less pain. She has slept better with the muscle relaxant. She feels the [place] from last visit has helped.  Krista [name] states that she has signed up for a [employer] but will meet with the director to establish a plan before she starts exercising.     Currently in Pain?  Yes    Pain Score  6     Pain Location  Back    Pain Orientation  Left    Pain Descriptors / Indicators  Aching    Pain Type  Acute pain    Pain Radiating Towards  anterior and lateral Lt hip and thigh and straight across the back     Pain Onset  More than a month ago    Pain Frequency  Constant    Aggravating Factors   prolonged sitting; sit to stand; lying down    Pain  Relieving Factors  TENS heat; ice; muscle relaxant          [facility] PT Assessment - 07/26/18 0001      Assessment   Medical Diagnosis  acute onset of chronic LBP    Referring Provider  Dr Sheppard Coil    Onset Date/Surgical Date  06/04/18    Hand Dominance  Right    Next MD Visit  8/19    Prior Therapy  PT early this year      Palpation   Palpation comment  significant muscular tightness with tenderness to light palpation through the Lt psoas/hip flexors; transverse abdominals; lats; QL; lumbar paraspinal musculature       Transfers   Comments  improving transfers and transitional movements with decreased pain                    [facility] Adult PT Treatment/Exercise - 07/26/18 0001      Lumbar Exercises: Stretches   Passive Hamstring Stretch  Left;3 reps;30 seconds;Right    Piriformis Stretch  Right;Left;3 reps;30 seconds  Lumbar Exercises: Supine   Clam  10 reps   core engaged alternating LE's    Bent Knee Raise Limitations  painful     Other Supine Lumbar Exercises  3 part core 10 sec x 10 reps; LE extension pressing into the table 5 sec x 10 reps    Other Supine Lumbar Exercises  shoulder flexion alternating UE's core engaged       Lumbar Exercises: Prone   Other Prone Lumbar Exercises  glut set 5 sec x 10; toe resting on surface extending knee keeping toe down pause and repeat x 10 each LT alternating       Moist Heat Therapy   Number Minutes Moist Heat  20 Minutes    Moist Heat Location  Lumbar Spine   Lt abdominal anterior hip area      Electrical Stimulation   Electrical Stimulation Location  lumbar/Lt lateral trunk area     Electrical Stimulation Action  IFC    Electrical Stimulation Parameters  to tolerance    Electrical Stimulation Goals  Pain;Tone      Ultrasound   Ultrasound Location  Lt lumbar paraspinals, QL, posterior Lt hip     Ultrasound Parameters  1.0 w/cm2; 100%; 1 mHZ; 8 min     Ultrasound Goals  Pain;Other (Comment)   tightness       Manual Therapy   Manual therapy comments  pt prone     Soft tissue mobilization  gentle soft tissue mobilization to pt tolerance working on Lt lats; QL; lumbar paraspinals; posterior hip              PT Education - 07/26/18 1416    Education Details  HEP     Person(s) Educated  Patient    Methods  Explanation;Demonstration;Tactile cues;Verbal cues;Handout    Comprehension  Verbalized understanding;Returned demonstration;Verbal cues required;Tactile cues required          PT Long Term Goals - 07/26/18 1417      PT LONG TERM GOAL #1   Title  improve tissue extensibility with patient to demonstrated increased mobility and ROM to [facility]. 8 weeks 08/17/18    Time  8    Period  Weeks    Status  On-going      PT LONG TERM GOAL #2   Title  Decrease pain by 50-70% allowing patient to perform functional activities with greater ease. 8 weeks 08/17/18    Time  8    Period  Weeks    Status  On-going      PT LONG TERM GOAL #3   Title  Pt will be albe to sleep without pain or discomfort.  8 weeks 08/17/18    Time  8    Period  Weeks    Status  Partially Met      PT LONG TERM GOAL #4   Title  Independent in HEP. 8 weeks 08/17/18    Time  8    Period  Weeks    Status  On-going      PT LONG TERM GOAL #5   Title  Improve FOTO to </= 46% limitation 8 weeks 08/17/18    Time  8    Period  Weeks    Status  On-going              Patient will benefit from skilled therapeutic intervention in order to improve the following deficits and impairments:     Visit Diagnosis: Acute bilateral low back pain without sciatica  Acute midline low back pain, with sciatica presence unspecified  Other symptoms and signs involving the musculoskeletal system  Muscle weakness (generalized)     Problem List Patient Active Problem List   Diagnosis Date Noted  . Acute bronchitis with COPD ([place]) 05/10/2018  . Impacted cerumen of left ear 03/07/2018  . History of left mastoidectomy 03/07/2018   . Tympanosclerosis, left ear 03/07/2018  . Cardiac risk counseling 09/05/2017  . Essential hypertension 09/05/2017  . Mixed hyperlipidemia 09/05/2017  . History of Bell's palsy 08/15/2017  . History of osteoporosis 08/15/2017  . History of colon polyps 08/15/2017  . Diarrhea 08/15/2017  . History of nonmelanoma skin cancer 08/15/2017  . Brain aneurysm 08/15/2017  . Bilateral hearing loss 08/15/2017  . H/O hysterectomy for benign disease 08/15/2017    Jabarie Pop Nilda Simmer PT, MPH  07/26/2018, 2:45 PM  [facility] [place], [place], [address] Phone: [phone]   Fax:  [phone]  Name: Krista [name] MRN: [id number] Date of Birth: 1951-02-24

## 2018-07-26 NOTE — Patient Instructions (Addendum)
Gluteal Sets    Tighten buttocks while pressing pelvis to floor. Hold __5__ seconds. Repeat __10__ times per set. Do __1-2__ sets per session. Do __1-2__ sessions per day.   Rest toes on surface, then lift knee slightly while keeping toes down  Pause repeat 10 times 1-2 sets 1-2 times/day

## 2018-07-27 ENCOUNTER — Encounter: Payer: Self-pay | Admitting: Rehabilitative and Restorative Service Providers"

## 2018-07-27 ENCOUNTER — Ambulatory Visit (INDEPENDENT_AMBULATORY_CARE_PROVIDER_SITE_OTHER): Payer: Medicare Other | Admitting: Rehabilitative and Restorative Service Providers"

## 2018-07-27 DIAGNOSIS — R29898 Other symptoms and signs involving the musculoskeletal system: Secondary | ICD-10-CM | POA: Diagnosis not present

## 2018-07-27 DIAGNOSIS — M6281 Muscle weakness (generalized): Secondary | ICD-10-CM | POA: Diagnosis not present

## 2018-07-27 DIAGNOSIS — M545 Low back pain, unspecified: Secondary | ICD-10-CM

## 2018-07-27 NOTE — Therapy (Addendum)
[place], [place], [address] Phone: [phone]   Fax:  [phone]  Physical Therapy Treatment  Patient Details  Name: Krista [name] MRN: [id number] Date of Birth: 06-13-1951 Referring Provider: Dr Sheppard Coil   Encounter Date: 07/27/2018  PT End of Session - 07/27/18 1102    Visit Number  7    Number of Visits  12    Date for PT Re-Evaluation  08/17/18    PT Start Time  1101    PT Stop Time  1200    PT Time Calculation (min)  59 min    Activity Tolerance  Patient tolerated treatment well       Past Medical History:  Diagnosis Date  . Arthritis   . Bell palsy   . Brain aneurysm   . Hyperlipidemia   . Hypertension   . Osteoporosis     Past Surgical History:  Procedure Laterality Date  . ABDOMINAL HYSTERECTOMY    . CEREBRAL ANEURYSM REPAIR    . EYE SURGERY     cararact  . FOOT SURGERY    . HAND SURGERY    . MASTOIDECTOMY Left     There were no vitals filed for this visit.  Subjective Assessment - 07/27/18 1102    Subjective  Patient reports that she did ok after treatment last night until about 7 pm - when she felt some increased pain in the Lt LB/hip area. She put an ice pack on that area for ~ 30 min with some improvement. Increased pain in the back may have been form standing on hard floors for ~ 2 hours to cook dinner last night - pain started after she sat down to eat. She slept pretty well but awoke this am with "a catch" in the Lt LB to hip area.     Currently in Pain?  Yes    Pain Score  7     Pain Location  Back    Pain Orientation  Left    Pain Descriptors / Indicators  Aching    Pain Type  Acute pain    Pain Onset  More than a month ago    Pain Frequency  Constant                       [facility] Adult PT Treatment/Exercise - 07/27/18 0001      Lumbar Exercises: Stretches   Passive Hamstring Stretch  Right;Left;3 reps;30 seconds   supine with strap    Piriformis Stretch  Right;Left;3 reps;30 seconds      Lumbar Exercises: Supine   Clam  10 reps   core engaged alternating LE's    Bridge  5 reps;2 seconds    Other Supine Lumbar Exercises  3 part core 10 sec x 10 reps; LE extension pressing into the table 5 sec x 10 reps    Other Supine Lumbar Exercises  shoulder flexion alternating UE's core engaged       Lumbar Exercises: Prone   Other Prone Lumbar Exercises  glut set 5 sec x 10; toe resting on surface extending knee keeping toe down pause and repeat x 10 each LT alternating       Moist Heat Therapy   Number Minutes Moist Heat  20 Minutes    Moist Heat Location  Lumbar Spine   Lt abdominal anterior hip area      Electrical Stimulation   Electrical Stimulation Location  lumbar/Lt lateral trunk area  Electrical Stimulation Action  IFC    Electrical Stimulation Parameters  to tolerance    Electrical Stimulation Goals  Pain;Tone      Iontophoresis   Type of Iontophoresis  Dexamethasone    Location  Lt lumbar paraspinals     Dose  120 mAmp    Time  10 hours       Manual Therapy   Manual Therapy  --   patient tolerating light pressure w/ manual work; increased    Manual therapy comments  pt prone     Soft tissue mobilization  gentle soft tissue mobilization to pt tolerance working on [employer]; QL; lumbar paraspinals; posterior hip     Myofascial Release  anterior abdominal to lateral trunk area through the area of psoas and lateral trunk              PT Education - 07/27/18 1159    Education Details  HEP     Person(s) Educated  Patient    Methods  Explanation    Comprehension  Verbalized understanding          PT Long Term Goals - 07/26/18 1417      PT LONG TERM GOAL #1   Title  improve tissue extensibility with patient to demonstrated increased mobility and ROM to [facility]. 8 weeks 08/17/18    Time  8    Period  Weeks    Status  On-going      PT LONG TERM GOAL #2   Title  Decrease pain by 50-70% allowing  patient to perform functional activities with greater ease. 8 weeks 08/17/18    Time  8    Period  Weeks    Status  On-going      PT LONG TERM GOAL #3   Title  Pt will be albe to sleep without pain or discomfort.  8 weeks 08/17/18    Time  8    Period  Weeks    Status  Partially Met      PT LONG TERM GOAL #4   Title  Independent in HEP. 8 weeks 08/17/18    Time  8    Period  Weeks    Status  On-going      PT LONG TERM GOAL #5   Title  Improve FOTO to </= 46% limitation 8 weeks 08/17/18    Time  8    Period  Weeks    Status  On-going            Plan - 07/27/18 1109    Clinical Impression Statement  Persistent pain in the Lt LB to posterior hip area. Patient tolerated gentle exercises yesterday as well as the [place] and manual work with some improvement in symptoms. Increased pain last night likely related to standing to cook for ~ 2 hours. Patient tolerated gentle exercise and some increase in geltle soft tissue work through [employer] psoas and lumbar paraspinals/QL/lats into the posterior hip. Patient continues to be very sensitive to touch with pain limiting functional activities.     Rehab Potential  Fair    Clinical Impairments Affecting Rehab Potential  chronic nature of pain with lumbar degenerative changes    PT Frequency  2x / week    PT Duration  6 weeks    PT Treatment/Interventions  ADLs/Self Care Home Management;Cryotherapy;Electrical Stimulation;Moist Heat;Traction;Ultrasound;Therapeutic exercise;Balance training;Neuromuscular re-education;Patient/family education;Manual techniques;Passive range of motion;Dry needling    PT Next Visit Plan  continue manual therapy to Lt QL/psoas as tolerated.  modify HEP as needed.  assess response to trial of ionto    Consulted and Agree with Plan of Care  Patient       Patient will benefit from skilled therapeutic intervention in order to improve the following deficits and impairments:  Decreased activity tolerance, Decreased range of  motion, Decreased strength, Hypomobility, Difficulty walking, Pain, Increased muscle spasms, Postural dysfunction  Visit Diagnosis: Acute bilateral low back pain without sciatica  Acute midline low back pain, with sciatica presence unspecified  Other symptoms and signs involving the musculoskeletal system  Muscle weakness (generalized)     Problem List Patient Active Problem List   Diagnosis Date Noted  . Acute bronchitis with COPD ([place]) 05/10/2018  . Impacted cerumen of left ear 03/07/2018  . History of left mastoidectomy 03/07/2018  . Tympanosclerosis, left ear 03/07/2018  . Cardiac risk counseling 09/05/2017  . Essential hypertension 09/05/2017  . Mixed hyperlipidemia 09/05/2017  . History of Bell's palsy 08/15/2017  . History of osteoporosis 08/15/2017  . History of colon polyps 08/15/2017  . Diarrhea 08/15/2017  . History of nonmelanoma skin cancer 08/15/2017  . Brain aneurysm 08/15/2017  . Bilateral hearing loss 08/15/2017  . H/O hysterectomy for benign disease 08/15/2017    Celyn Nilda Simmer PT, MPH  07/27/2018, 12:04 PM  [facility] [place], [place], [address] Phone: [phone]   Fax:  [phone]  Name: Krista [name] MRN: [id number] Date of Birth: [birth year]  PHYSICAL THERAPY DISCHARGE SUMMARY  Visits from Start of Care: 7  Current functional level related to goals / functional outcomes: See progress note for discharge status    Remaining deficits: Continued pain and limited functional abilities    Education / Equipment: HEP  Plan: Patient agrees to discharge.  Patient goals were not met. Patient is being discharged due to the patient's request.  ?????     Celyn P. Helene Kelp PT, MPH 08/17/18 4:30 PM

## 2018-07-27 NOTE — Patient Instructions (Signed)

## 2018-07-31 ENCOUNTER — Ambulatory Visit (INDEPENDENT_AMBULATORY_CARE_PROVIDER_SITE_OTHER): Payer: Medicare Other | Admitting: Osteopathic Medicine

## 2018-07-31 ENCOUNTER — Encounter: Payer: Self-pay | Admitting: Osteopathic Medicine

## 2018-07-31 VITALS — BP 120/76 | HR 101 | Temp 97.5°F | Wt 128.2 lb

## 2018-07-31 DIAGNOSIS — M6740 Ganglion, unspecified site: Secondary | ICD-10-CM

## 2018-07-31 DIAGNOSIS — J449 Chronic obstructive pulmonary disease, unspecified: Secondary | ICD-10-CM

## 2018-07-31 DIAGNOSIS — M545 Low back pain, unspecified: Secondary | ICD-10-CM

## 2018-07-31 DIAGNOSIS — M19042 Primary osteoarthritis, left hand: Secondary | ICD-10-CM | POA: Insufficient documentation

## 2018-07-31 HISTORY — DX: Primary osteoarthritis, left hand: M19.042

## 2018-07-31 MED ORDER — BUDESONIDE-FORMOTEROL FUMARATE 160-4.5 MCG/ACT IN AERO: 2.0000 | INHALATION_SPRAY | Freq: Two times a day (BID) | RESPIRATORY_TRACT | 11 refills | Status: DC

## 2018-07-31 NOTE — Assessment & Plan Note (Signed)
Aspiration and injection. Return to see me in 1 month.

## 2018-07-31 NOTE — Progress Notes (Signed)
Subjective:    CC: Left finger pain  HPI: For months this pleasant 67 year old female has had pain that she localizes in the left fifth DIP, she has a lesion just proximal to the proximal nail fold, as well as pain at the DIP.  Symptoms are moderate, persistent, no radiation.  No trauma.  I reviewed the past medical history, family history, social history, surgical history, and allergies today and no changes were needed.  Please see the problem list section below in epic for further details.  Past Medical History: Past Medical History:  Diagnosis Date  . Arthritis   . Bell palsy   . Brain aneurysm   . Hyperlipidemia   . Hypertension   . Osteoporosis    Past Surgical History: Past Surgical History:  Procedure Laterality Date  . ABDOMINAL HYSTERECTOMY    . CEREBRAL ANEURYSM REPAIR    . EYE SURGERY     cararact  . FOOT SURGERY    . HAND SURGERY    . MASTOIDECTOMY Left    Social History: Social History   Socioeconomic History  . Marital status: Married    Spouse name: Not on file  . Number of children: Not on file  . Years of education: Not on file  . Highest education level: Not on file  Occupational History  . Not on file  Social Needs  . Financial resource strain: Not on file  . Food insecurity:    Worry: Not on file    Inability: Not on file  . Transportation needs:    Medical: Not on file    Non-medical: Not on file  Tobacco Use  . Smoking status: Former Smoker    Last attempt to quit: 12/05/2008    Years since quitting: 9.6  . Smokeless tobacco: Never Used  Substance and Sexual Activity  . Alcohol use: No  . Drug use: No  . Sexual activity: Not on file  Lifestyle  . Physical activity:    Days per week: Not on file    Minutes per session: Not on file  . Stress: Not on file  Relationships  . Social connections:    Talks on phone: Not on file    Gets together: Not on file    Attends religious service: Not on file    Active member of club or  organization: Not on file    Attends meetings of clubs or organizations: Not on file    Relationship status: Not on file  Other Topics Concern  . Not on file  Social History Narrative  . Not on file   Family History: Family History  Problem Relation Age of Onset  . Heart failure Mother   . Cancer Father    Allergies: Allergies  Allergen Reactions  . Codeine Other (See Comments)    Hyperactivity "wired" super hyperactivity    . Kiwi Extract Hives  . Other Other (See Comments), Hives and Rash    Nectarines--tongue swells  . Plum Pulp Hives  . Tomato Hives  . Prednisone     Irritability    Medications: See med rec.  Review of Systems: No fevers, chills, night sweats, weight loss, chest pain, or shortness of breath.   Objective:    General: Well Developed, well nourished, and in no acute distress.  Neuro: Alert and oriented x3, extra-ocular muscles intact, sensation grossly intact.  HEENT: Normocephalic, atraumatic, pupils equal round reactive to light, neck supple, no masses, no lymphadenopathy, thyroid nonpalpable.  Skin: Warm and dry, no  rashes. Cardiac: Regular rate and rhythm, no murmurs rubs or gallops, no lower extremity edema.  Respiratory: Clear to auscultation bilaterally. Not using accessory muscles, speaking in full sentences. Left hand: Bouchard and [place] nodes, tenderness at the left fifth DIP, mucoid cyst at the proximal nail fold.  Procedure: Real-time Ultrasound Guided Injection of left fifth DIP Device: GE Logiq E  Verbal informed consent obtained.  Time-out conducted.  Noted no overlying erythema, induration, or other signs of local infection.  Skin prepped in a sterile fashion.  Local anesthesia: Topical Ethyl chloride.  With sterile technique and under real time ultrasound guidance: Using a 25-gauge needle advanced into the DIP, injected 1/2 cc kenalog 40, 1/2 cc lidocaine, the needle was redirected and some medication was placed underneath the  mucoid cyst itself. Completed without difficulty  Pain immediately resolved suggesting accurate placement of the medication.  Advised to call if fevers/chills, erythema, induration, drainage, or persistent bleeding.  Images permanently stored and available for review in the ultrasound unit.  Impression: Technically successful ultrasound guided injection.  Impression and Recommendations:    Mucoid cyst of left fifth DIP Aspiration and injection. Return to see me in 1 month. ___________________________________________ Gwen Her. Dianah Field, M.D., ABFM., CAQSM. Primary Care and [place] Instructor of [place] [employer] [facility]

## 2018-07-31 NOTE — Progress Notes (Signed)
HPI: Krista [name] is a 67 y.o. female who  has a past medical history of Arthritis, Bell palsy, Brain aneurysm, Hyperlipidemia, Hypertension, and Osteoporosis.  she presents to [facility] today, 07/31/18,  for chief complaint of:  Back pain COPD Skin concern  Back pain: doing well with physical therapy. We sent flexeril to help with muscle spasms, this is doing well for her.   Bump by her cuticle by left pinky, keeps peeling it off but it keeps coming back. Has been there about 6 mos. Assoc w/ joint swelling at that DIP on occasoin   COPD: breathing is much better on the new inhaler. Symbicort bid.       Past medical history, surgical history, and family history reviewed.  Current medication list and allergy/intolerance information reviewed.   (See remainder of HPI, ROS, Phys Exam below)    ASSESSMENT/PLAN:   Moderate COPD (chronic obstructive pulmonary disease) ([place]) - doing well on symbicort, reports she has had the pneumonia vaccines already   Bilateral low back pain without sciatica, unspecified chronicity - continue PT as needed, continue home exericse   Mucoid cyst of joint - see Dr T note    Meds ordered this encounter  Medications  . DISCONTD: budesonide-formoterol (SYMBICORT) 160-4.5 MCG/ACT inhaler    Sig: Inhale 2 puffs into the lungs 2 (two) times daily.    Dispense:  1 Inhaler    Refill:  11  . budesonide-formoterol (SYMBICORT) 160-4.5 MCG/ACT inhaler    Sig: Inhale 2 puffs into the lungs 2 (two) times daily.    Dispense:  1 Inhaler    Refill:  11      Follow-up plan: Return for removal of cyst with Dr T, otherwise follow up with Dr A for routine annual and labs 12/2018.                              ############################################ ############################################ ############################################ ############################################    Outpatient Encounter Medications as of 07/31/2018  Medication Sig Note  . alendronate (FOSAMAX) 70 MG tablet Take 1 tablet (70 mg total) by mouth once a week.   . budesonide-formoterol (SYMBICORT) 160-4.5 MCG/ACT inhaler Inhale 2 puffs into the lungs 2 (two) times daily.   . Calcium-Magnesium-Vitamin D [id number] MG-MG-UNIT TB24 Take by mouth daily.   . Cholecalciferol (VITAMIN D3) 5000 units CAPS Take 5,000 Units by mouth daily.   . cyclobenzaprine (FLEXERIL) 10 MG tablet Take 0.5-1 tablets (5-10 mg total) by mouth 3 (three) times daily as needed for muscle spasms. Caution: can cause drowsiness   . hydrochlorothiazide (HYDRODIURIL) 12.5 MG tablet TAKE 1 TABLET BY MOUTH EVERY DAY   . ipratropium-albuterol (DUONEB) 0.5-2.5 (3) MG/3ML SOLN Take 3 mLs by nebulization every 2 (two) hours as needed (wheeze, SOB).   . Potassium 95 MG TABS Take 2 tablets (190 mg total) by mouth daily.   . pravastatin (PRAVACHOL) 20 MG tablet Take by mouth. 05/10/2018: As per pt, taking 40 mg daily   No facility-administered encounter medications on file as of 07/31/2018.    Allergies  Allergen Reactions  . Codeine Other (See Comments)    Hyperactivity "wired" super hyperactivity    . Kiwi Extract Hives  . Other Other (See Comments), Hives and Rash    Nectarines--tongue swells  . Plum Pulp Hives  . Tomato Hives  . Prednisone     Irritability       Review of Systems:  Constitutional: No  recent illness  HEENT: No  headache, no vision change  Cardiac: No  chest pain, No  pressure, No palpitations  Respiratory:  No  shortness of breath. No  Cough  Neurologic: No  weakness, No  Dizziness  Psychiatric: No  concerns with depression, No  concerns with anxiety  Exam:   BP 120/76 (BP Location: Right Arm, Patient Position: Sitting, Cuff Size: Normal)   Pulse (!) 101   Temp (!) 97.5 F (36.4 C) (Oral)   Wt 128 lb 3.2 oz (58.2 kg)   BMI 21.01 kg/m   Constitutional: VS see above. General Appearance: alert, well-developed, well-nourished, NAD  Eyes: Normal lids and conjunctive, non-icteric sclera  Ears, Nose, Mouth, Throat: MMM, Normal external inspection ears/nares/mouth/lips/gums.  Neck: No masses, trachea midline.   Respiratory: Normal respiratory effort. no wheeze, no rhonchi, no rales, diminished breath sounds bilaterally    Cardiovascular: S1/S2 normal, no murmur, no rub/gallop auscultated. RRR.   Musculoskeletal: Gait normal. Symmetric and independent movement of all extremities. Small ?cyst on L distal 5th digit   Neurological: Normal balance/coordination. No tremor.  Skin: warm, dry, intact.   Psychiatric: Normal judgment/insight. Normal mood and affect. Oriented x3.   Visit summary with medication list and pertinent instructions was printed for patient to review, advised to alert [place] if any changes needed. All questions at time of visit were answered - patient instructed to contact office with any additional concerns. ER/RTC precautions were reviewed with the patient and understanding verbalized.   Follow-up plan: Return for removal of cyst with Dr T, otherwise follow up with Dr A for routine annual and labs 12/2018.  Note: Total time spent 15 minutes, greater than 50% of the visit was spent face-to-face counseling and coordinating care for the following: The primary encounter diagnosis was Moderate COPD (chronic obstructive pulmonary disease) ([place]). Diagnoses of Bilateral low back pain without sciatica, unspecified chronicity and Mucoid cyst of joint were also pertinent to this visit.Marland Kitchen  Please note: voice recognition software was used to produce this document, and typos may escape review. Please contact Dr. Sheppard Coil for any needed  clarifications.

## 2018-08-15 ENCOUNTER — Other Ambulatory Visit: Payer: Self-pay | Admitting: Osteopathic Medicine

## 2018-08-15 DIAGNOSIS — E782 Mixed hyperlipidemia: Secondary | ICD-10-CM

## 2018-08-15 NOTE — Telephone Encounter (Signed)
Last note : pt taking 40 mg, will refill at that dose

## 2018-08-15 NOTE — Progress Notes (Signed)
Subjective:   Krista [name] is a 67 y.o. female who presents for an Initial Medicare Annual Wellness Visit.  Review of Systems    No ROS.  Medicare Wellness Visit. Additional risk factors are reflected in the social history.   Cardiac Risk Factors include: hypertension;advanced age (>40men, >23 women);dyslipidemia Sleep patterns:Sleeps about 4 hours a night. Gets up 1 time to urinate. Husband snores and keeps her up.   Home Safety/Smoke Alarms: Feels safe in home. Smoke alarms in place.  Living environment;Lives with husband in 1 story home.Has step over tub/ shower combo. No grab rails in place advised pt to have some put in place.    Female:   Pap- aged out      [place]- utd due 08/31/2019      Dexa scan-utd due 07/11/2020        [facility]S-utd due 09/14/27     Objective:    Today's Vitals   08/21/18 1459  BP: (!) 129/58  Pulse: 94  SpO2: 99%  Weight: 127 lb (57.6 kg)  Height: 5\' 6"  (1.676 m)  PainSc: 8    Body mass index is 20.5 kg/m.  Advanced Directives 08/21/2018 07/06/2018 01/08/2018  Does Patient Have a Medical Advance Directive? Yes Yes Yes  Type of Paramedic of [place];Living will;Out of facility DNR (pink MOST or yellow form) [place];Living will [place];Living will  Does patient want to make changes to medical advance directive? No - Patient declined No - Patient declined -  Copy of [place] in Chart? No - copy requested No - copy requested No - copy requested    Current Medications (verified) Outpatient Encounter Medications as of 08/21/2018  Medication Sig  . alendronate (FOSAMAX) 70 MG tablet Take 1 tablet (70 mg total) by mouth once a week.  . budesonide-formoterol (SYMBICORT) 160-4.5 MCG/ACT inhaler Inhale 2 puffs into the lungs 2 (two) times daily.  . Calcium-Magnesium-Vitamin D 160-73-710 MG-MG-UNIT TB24 Take by mouth daily.  . Cholecalciferol (VITAMIN D3) 5000 units CAPS Take  5,000 Units by mouth daily.  . cyclobenzaprine (FLEXERIL) 10 MG tablet TAKE 0.5-1 TABLETS BY MOUTH 3 TIMES DAILY AS NEEDED FOR MUSCLE SPASMS. CAUTION: CAN CAUSE DROWSINESS  . ipratropium-albuterol (DUONEB) 0.5-2.5 (3) MG/3ML SOLN Take 3 mLs by nebulization every 2 (two) hours as needed (wheeze, SOB).  . pravastatin (PRAVACHOL) 40 MG tablet Take 1 tablet (40 mg total) by mouth daily.  . hydrochlorothiazide (HYDRODIURIL) 12.5 MG tablet TAKE 1 TABLET BY MOUTH EVERY DAY (Patient not taking: Reported on 08/21/2018)  . Potassium 95 MG TABS Take 2 tablets (190 mg total) by mouth daily.  . [DISCONTINUED] cyclobenzaprine (FLEXERIL) 10 MG tablet Take 0.5-1 tablets (5-10 mg total) by mouth 3 (three) times daily as needed for muscle spasms. Caution: can cause drowsiness  . [DISCONTINUED] pravastatin (PRAVACHOL) 20 MG tablet Take by mouth.   No facility-administered encounter medications on file as of 08/21/2018.     Allergies (verified) Codeine; Kiwi extract; Other; Plum pulp; Tomato; and Prednisone   History: Past Medical History:  Diagnosis Date  . Arthritis   . Bell palsy   . Brain aneurysm   . Hyperlipidemia   . Hypertension   . Osteoporosis    Past Surgical History:  Procedure Laterality Date  . ABDOMINAL HYSTERECTOMY    . CEREBRAL ANEURYSM REPAIR    . EYE SURGERY     cararact  . FOOT SURGERY    . HAND SURGERY    .  MASTOIDECTOMY Left    Family History  Problem Relation Age of Onset  . Heart failure Mother   . Cancer Father    Social History   Socioeconomic History  . Marital status: Married    Spouse name: Randal  . Number of children: 2  . Years of education: 86  . Highest education level: 12th grade  Occupational History  . Occupation: retired    Comment: Retail buyer  Social Needs  . Financial resource strain: Not hard at all  . Food insecurity:    Worry: Never true    Inability: Never true  . Transportation needs:    Medical: No    Non-medical: No  Tobacco  Use  . Smoking status: Former Smoker    Last attempt to quit: 12/05/2008    Years since quitting: 9.7  . Smokeless tobacco: Never Used  Substance and Sexual Activity  . Alcohol use: No  . Drug use: No  . Sexual activity: Not Currently  Lifestyle  . Physical activity:    Days per week: 5 days    Minutes per session: 60 min  . Stress: Not at all  Relationships  . Social connections:    Talks on phone: More than three times a week    Gets together: Twice a week    Attends religious service: Never    Active member of club or organization: Yes    Attends meetings of clubs or organizations: More than 4 times per year    Relationship status: Married  Other Topics Concern  . Not on file  Social History Narrative  . Not on file    Tobacco Counseling Counseling given: Not Answered   Clinical Intake:  Pre-visit preparation completed: Yes  Pain : 0-10(lower left back. does exercises for this) Pain Score: 8  Pain Type: Chronic pain Pain Location: Back Pain Orientation: Left Pain Radiating Towards: towards the front side. Using tens unit as well for the pain. Pain Descriptors / Indicators: Aching, Constant Pain Onset: In the past 7 days Pain Frequency: Occasional     Nutritional Risks: None Diabetes: No  How often do you need to have someone help you when you read instructions, pamphlets, or other written materials from your doctor or pharmacy?: 1 - Never What is the last grade level you completed in school?: 12  Interpreter Needed?: No      Activities of Daily Living In your present state of health, do you have any difficulty performing the following activities: 08/21/2018  Hearing? Y  Comment looking into hearing aids  Vision? Y  Comment has eye appt. in [place] with [facility]  Difficulty concentrating or making decisions? N  Walking or climbing stairs? N  Comment hip hurts when goes up and down  Dressing or bathing? N  Doing errands, shopping? N  Preparing  Food and eating ? N  Using the Toilet? N  In the past six months, have you accidently leaked urine? N  Do you have problems with loss of bowel control? N  Managing your Medications? N  Managing your Finances? N  Housekeeping or managing your Housekeeping? N  Some recent data might be hidden     Immunizations and Health Maintenance Immunization History  Administered Date(s) Administered  . Influenza, High Dose Seasonal PF 08/15/2017   Health Maintenance Due  Topic Date Due  . Hepatitis C Screening  [birth year]  . PNA vac Low Risk Adult (1 of 2 - PCV13) 07/18/2016    Patient Care Team:  Emeterio Reeve, DO as PCP - General (Osteopathic Medicine)  Indicate any recent Medical Services you may have received from other than [facility] providers in the past year (date may be approximate).     Assessment:   This is a routine wellness examination for [facility] Physical assessment deferred to PCP.   Hearing/Vision screen  Visual Acuity Screening   Right eye Left eye Both eyes  Without correction: 20/40 20/50 20/30   With correction:     Hearing Screening Comments: Whisper Test done- repeated 3 words back out of 5 she heard. Is gonna check into hearing aids  Dietary issues and exercise activities discussed: Current Exercise Habits: Home exercise routine, Type of exercise: walking, Time (Minutes): 60, Frequency (Times/Week): 5, Weekly Exercise (Minutes/Week): 300, Intensity: Mild Diet- eats 3 meals a day. Breakfast:banana, yogurt and muffin Lunch: sandwich, celery and carrots, zucchini Dinner: meat, peas, mashed potatoes. Eats fish or chicken mostly . Drinks 8-10 10 ounce glasses     Goals    . Exercise 3x per week (30 min per time)     Going to join [employer] and will start doing some aerobics there.You are doing so good with your diet and exercise routine      Depression Screen PHQ 2/9 Scores 08/21/2018 05/10/2018 08/15/2017  PHQ - 2 Score 0 0 0  PHQ- 9 Score - 0 -    Fall Risk Fall  Risk  08/21/2018 08/15/2017  Falls in the past year? No No    Is the patient's home free of loose throw rugs in walkways, pet beds, electrical cords, etc?   yes      Grab bars in the bathroom? no      Handrails on the stairs?   no      Adequate lighting?   yes  Cognitive Function:     6CIT Screen 08/21/2018  What Year? 0 points  What month? 0 points  What time? 0 points  Count back from 20 0 points  Months in reverse 0 points  Repeat phrase 0 points  Total Score 0    Screening Tests Health Maintenance  Topic Date Due  . Hepatitis C Screening  Jun 29, 1951  . PNA vac Low Risk Adult (1 of 2 - PCV13) 07/18/2016  . INFLUENZA VACCINE  09/03/2018 (Originally 07/05/2018)  . TETANUS/TDAP  05/11/2019 (Originally 07/18/1970)  . MAMMOGRAM  08/31/2019  . COLONOSCOPY  09/14/2027  . DEXA SCAN  Completed     Plan:  Please schedule your next medicare wellness visit with me in 1 yr.  Ms. Sesma , Thank you for taking time to come for your Medicare Wellness Visit. I appreciate your ongoing commitment to your health goals. Please review the following plan we discussed and let me know if I can assist you in the future.  Continue exercising and eating a healthy diet, you are doing so well.   These are the goals we discussed: Goals    . Exercise 3x per week (30 min per time)     Going to join [employer] and will start doing some aerobics there.You are doing so good with your diet and exercise routine       This is a list of the screening recommended for you and due dates:  Health Maintenance  Topic Date Due  .  Hepatitis C: One time screening is recommended by Center for Disease Control  (CDC) for  adults born from [birth year] through 1965.   February 08, 1951  . Pneumonia vaccines (1 of 2 -  PCV13) 07/18/2016  . Flu Shot  09/03/2018*  . Tetanus Vaccine  05/11/2019*  . Mammogram  08/31/2019  . Colon Cancer Screening  09/14/2027  . DEXA scan (bone density measurement)  Completed  *Topic was postponed.  The date shown is not the original due date.     I have personally reviewed and noted the following in the patient's chart:   . Medical and social history . Use of alcohol, tobacco or illicit drugs  . Current medications and supplements . Functional ability and status . Nutritional status . Physical activity . Advanced directives . List of other physicians . Hospitalizations, surgeries, and ER visits in previous 12 months . Vitals . Screenings to include cognitive, depression, and falls . Referrals and appointments  In addition, I have reviewed and discussed with patient certain preventive protocols, quality metrics, and best practice recommendations. A written personalized care plan for preventive services as well as general preventive health recommendations were provided to patient.     Joanne Chars, LPN   [id number]

## 2018-08-15 NOTE — Telephone Encounter (Signed)
[facility] requesting med RF for pravastatin. Written by historical provider. Thanks.

## 2018-08-21 ENCOUNTER — Other Ambulatory Visit: Payer: Self-pay | Admitting: Osteopathic Medicine

## 2018-08-21 ENCOUNTER — Ambulatory Visit (INDEPENDENT_AMBULATORY_CARE_PROVIDER_SITE_OTHER): Payer: Medicare Other | Admitting: *Deleted

## 2018-08-21 VITALS — BP 100/60 | HR 94 | Ht 66.0 in | Wt 127.0 lb

## 2018-08-21 DIAGNOSIS — Z0000 Encounter for general adult medical examination without abnormal findings: Secondary | ICD-10-CM

## 2018-08-21 NOTE — Patient Instructions (Addendum)
Please schedule your next medicare wellness visit with me in 1 yr.  Krista [name] , Thank you for taking time to come for your Medicare Wellness Visit. I appreciate your ongoing commitment to your health goals. Please review the following plan we discussed and let me know if I can assist you in the future. Health Maintenance for Postmenopausal Women Menopause is a normal process in which your reproductive ability comes to an end. This process happens gradually over a span of months to years, usually between the ages of 69 and 21. Menopause is complete when you have missed 12 consecutive menstrual periods. It is important to talk with your health care provider about some of the most common conditions that affect postmenopausal women, such as heart disease, cancer, and bone loss (osteoporosis). Adopting a healthy lifestyle and getting preventive care can help to promote your health and wellness. Those actions can also lower your chances of developing some of these common conditions. What should I know about menopause? During menopause, you may experience a number of symptoms, such as:  Moderate-to-severe hot flashes.  Night sweats.  Decrease in sex drive.  Mood swings.  Headaches.  Tiredness.  Irritability.  Memory problems.  Insomnia.  Choosing to treat or not to treat menopausal changes is an individual decision that you make with your health care provider. What should I know about hormone replacement therapy and supplements? Hormone therapy products are effective for treating symptoms that are associated with menopause, such as hot flashes and night sweats. Hormone replacement carries certain risks, especially as you become older. If you are thinking about using estrogen or estrogen with progestin treatments, discuss the benefits and risks with your health care provider. What should I know about heart disease and stroke? Heart disease, heart attack, and stroke become more likely as you  age. This may be due, in part, to the hormonal changes that your body experiences during menopause. These can affect how your body processes dietary fats, triglycerides, and cholesterol. Heart attack and stroke are both medical emergencies. There are many things that you can do to help prevent heart disease and stroke:  Have your blood pressure checked at least every 1-2 years. High blood pressure causes heart disease and increases the risk of stroke.  If you are 67-37 years old, ask your health care provider if you should take aspirin to prevent a heart attack or a stroke.  Do not use any tobacco products, including cigarettes, chewing tobacco, or electronic cigarettes. If you need help quitting, ask your health care provider.  It is important to eat a healthy diet and maintain a healthy weight. ? Be sure to include plenty of vegetables, fruits, low-fat dairy products, and lean protein. ? Avoid eating foods that are high in solid fats, added sugars, or salt (sodium).  Get regular exercise. This is one of the most important things that you can do for your health. ? Try to exercise for at least 67 minutes each week. The type of exercise that you do should increase your heart rate and make you sweat. This is known as moderate-intensity exercise. ? Try to do strengthening exercises at least twice each week. Do these in addition to the moderate-intensity exercise.  Know your numbers.Ask your health care provider to check your cholesterol and your blood glucose. Continue to have your blood tested as directed by your health care provider.  What should I know about cancer screening? There are several types of cancer. Take the following steps to  reduce your risk and to catch any cancer development as early as possible. Breast Cancer  Practice breast self-awareness. ? This means understanding how your breasts normally appear and feel. ? It also means doing regular breast self-exams. Let your health  care provider know about any changes, no matter how small.  If you are 67 or older, have a clinician do a breast exam (clinical breast exam or CBE) every year. Depending on your age, family history, and medical history, it may be recommended that you also have a yearly breast X-ray (mammogram).  If you have a family history of breast cancer, talk with your health care provider about genetic screening.  If you are at high risk for breast cancer, talk with your health care provider about having an MRI and a mammogram every year.  Breast cancer (BRCA) gene test is recommended for women who have family members with BRCA-related cancers. Results of the assessment will determine the need for genetic counseling and BRCA1 and for BRCA2 testing. BRCA-related cancers include these types: ? Breast. This occurs in males or females. ? Ovarian. ? Tubal. This may also be called fallopian tube cancer. ? Cancer of the abdominal or pelvic lining (peritoneal cancer). ? Prostate. ? Pancreatic.  Cervical, Uterine, and Ovarian Cancer Your health care provider may recommend that you be screened regularly for cancer of the pelvic organs. These include your ovaries, uterus, and vagina. This screening involves a pelvic exam, which includes checking for microscopic changes to the surface of your cervix (Pap test).  For women ages 67-65, health care providers may recommend a pelvic exam and a Pap test every three years. For women ages 67-65, they may recommend the Pap test and pelvic exam, combined with testing for human papilloma virus (HPV), every five years. Some types of HPV increase your risk of cervical cancer. Testing for HPV may also be done on women of any age who have unclear Pap test results.  Other health care providers may not recommend any screening for nonpregnant women who are considered low risk for pelvic cancer and have no symptoms. Ask your health care provider if a screening pelvic exam is right for  you.  If you have had past treatment for cervical cancer or a condition that could lead to cancer, you need Pap tests and screening for cancer for at least 20 years after your treatment. If Pap tests have been discontinued for you, your risk factors (such as having a new sexual partner) need to be reassessed to determine if you should start having screenings again. Some women have medical problems that increase the chance of getting cervical cancer. In these cases, your health care provider may recommend that you have screening and Pap tests more often.  If you have a family history of uterine cancer or ovarian cancer, talk with your health care provider about genetic screening.  If you have vaginal bleeding after reaching menopause, tell your health care provider.  There are currently no reliable tests available to screen for ovarian cancer.  Lung Cancer Lung cancer screening is recommended for adults 33-50 years old who are at high risk for lung cancer because of a history of smoking. A yearly low-dose CT scan of the lungs is recommended if you:  Currently smoke.  Have a history of at least 30 pack-years of smoking and you currently smoke or have quit within the past 15 years. A pack-year is smoking an average of one pack of cigarettes per day for one year.  Yearly screening should:  Continue until it has been 15 years since you quit.  Stop if you develop a health problem that would prevent you from having lung cancer treatment.  Colorectal Cancer  This type of cancer can be detected and can often be prevented.  Routine colorectal cancer screening usually begins at age 24 and continues through age 20.  If you have risk factors for colon cancer, your health care provider may recommend that you be screened at an earlier age.  If you have a family history of colorectal cancer, talk with your health care provider about genetic screening.  Your health care provider may also recommend  using home test kits to check for hidden blood in your stool.  A small camera at the end of a tube can be used to examine your colon directly (sigmoidoscopy or colonoscopy). This is done to check for the earliest forms of colorectal cancer.  Direct examination of the colon should be repeated every 5-10 years until age 81. However, if early forms of precancerous polyps or small growths are found or if you have a family history or genetic risk for colorectal cancer, you may need to be screened more often.  Skin Cancer  Check your skin from head to toe regularly.  Monitor any moles. Be sure to tell your health care provider: ? About any new moles or changes in moles, especially if there is a change in a mole's shape or color. ? If you have a mole that is larger than the size of a pencil eraser.  If any of your family members has a history of skin cancer, especially at a young age, talk with your health care provider about genetic screening.  Always use sunscreen. Apply sunscreen liberally and repeatedly throughout the day.  Whenever you are outside, protect yourself by wearing long sleeves, pants, a wide-brimmed hat, and sunglasses.  What should I know about osteoporosis? Osteoporosis is a condition in which bone destruction happens more quickly than new bone creation. After menopause, you may be at an increased risk for osteoporosis. To help prevent osteoporosis or the bone fractures that can happen because of osteoporosis, the following is recommended:  If you are 51-18 years old, get at least 1,000 mg of calcium and at least 600 mg of vitamin D per day.  If you are older than age 7 but younger than age 39, get at least 1,200 mg of calcium and at least 600 mg of vitamin D per day.  If you are older than age 34, get at least 1,200 mg of calcium and at least 800 mg of vitamin D per day.  Smoking and excessive alcohol intake increase the risk of osteoporosis. Eat foods that are rich in  calcium and vitamin D, and do weight-bearing exercises several times each week as directed by your health care provider. What should I know about how menopause affects my mental health? Depression may occur at any age, but it is more common as you become older. Common symptoms of depression include:  Low or sad mood.  Changes in sleep patterns.  Changes in appetite or eating patterns.  Feeling an overall lack of motivation or enjoyment of activities that you previously enjoyed.  Frequent crying spells.  Talk with your health care provider if you think that you are experiencing depression. What should I know about immunizations? It is important that you get and maintain your immunizations. These include:  Tetanus, diphtheria, and pertussis (Tdap) booster vaccine.  Influenza  every year before the flu season begins.  Pneumonia vaccine.  Shingles vaccine.  Your health care provider may also recommend other immunizations. This information is not intended to replace advice given to you by your health care provider. Make sure you discuss any questions you have with your health care provider. Document Released: 01/13/2006 Document Revised: 06/10/2016 Document Reviewed: 08/25/2015 Elsevier Interactive Patient Education  2018 [place] exercising and eating a healthy diet, you are doing so well.  These are the goals we discussed: Goals    . Exercise 3x per week (30 min per time)     Going to join [employer] and will start doing some aerobics there.You are doing so good with your diet and exercise routine

## 2018-08-24 DIAGNOSIS — Z23 Encounter for immunization: Secondary | ICD-10-CM | POA: Diagnosis not present

## 2018-08-31 ENCOUNTER — Ambulatory Visit: Payer: Medicare Other | Admitting: Sports Medicine

## 2018-09-03 DIAGNOSIS — Z135 Encounter for screening for eye and ear disorders: Secondary | ICD-10-CM | POA: Diagnosis not present

## 2018-09-03 DIAGNOSIS — H524 Presbyopia: Secondary | ICD-10-CM | POA: Diagnosis not present

## 2018-09-20 ENCOUNTER — Other Ambulatory Visit: Payer: Self-pay | Admitting: Osteopathic Medicine

## 2018-09-20 DIAGNOSIS — B9789 Other viral agents as the cause of diseases classified elsewhere: Principal | ICD-10-CM

## 2018-09-20 DIAGNOSIS — J069 Acute upper respiratory infection, unspecified: Secondary | ICD-10-CM

## 2018-09-28 ENCOUNTER — Telehealth: Payer: Self-pay

## 2018-09-28 DIAGNOSIS — B9789 Other viral agents as the cause of diseases classified elsewhere: Principal | ICD-10-CM

## 2018-09-28 DIAGNOSIS — J069 Acute upper respiratory infection, unspecified: Secondary | ICD-10-CM

## 2018-09-28 NOTE — Telephone Encounter (Signed)
[facility] ref[facility] for ipratropium 0.06% spray. Rx is listed in pt's inactive med list. Pls advise, thanks.

## 2018-10-03 MED ORDER — IPRATROPIUM BROMIDE 0.06 % NA SOLN: 2.0000 | Freq: Four times a day (QID) | NASAL | 1 refills | Status: DC

## 2018-10-03 NOTE — Telephone Encounter (Signed)
Sent!

## 2018-10-28 ENCOUNTER — Other Ambulatory Visit: Payer: Self-pay | Admitting: Osteopathic Medicine

## 2018-10-29 ENCOUNTER — Other Ambulatory Visit: Payer: Self-pay | Admitting: Osteopathic Medicine

## 2018-10-29 DIAGNOSIS — H57812 Brow ptosis, left: Secondary | ICD-10-CM | POA: Diagnosis not present

## 2018-10-29 DIAGNOSIS — H02204 Unspecified lagophthalmos left upper eyelid: Secondary | ICD-10-CM | POA: Diagnosis not present

## 2018-10-29 DIAGNOSIS — J069 Acute upper respiratory infection, unspecified: Secondary | ICD-10-CM

## 2018-10-29 DIAGNOSIS — B9789 Other viral agents as the cause of diseases classified elsewhere: Principal | ICD-10-CM

## 2018-10-29 DIAGNOSIS — G510 Bell's palsy: Secondary | ICD-10-CM | POA: Diagnosis not present

## 2018-10-29 DIAGNOSIS — H02834 Dermatochalasis of left upper eyelid: Secondary | ICD-10-CM | POA: Diagnosis not present

## 2018-11-12 DIAGNOSIS — H02834 Dermatochalasis of left upper eyelid: Secondary | ICD-10-CM | POA: Diagnosis not present

## 2018-11-12 DIAGNOSIS — L821 Other seborrheic keratosis: Secondary | ICD-10-CM | POA: Diagnosis not present

## 2018-11-12 DIAGNOSIS — D485 Neoplasm of uncertain behavior of skin: Secondary | ICD-10-CM | POA: Diagnosis not present

## 2018-12-10 DIAGNOSIS — I671 Cerebral aneurysm, nonruptured: Secondary | ICD-10-CM | POA: Diagnosis not present

## 2018-12-13 DIAGNOSIS — I671 Cerebral aneurysm, nonruptured: Secondary | ICD-10-CM | POA: Diagnosis not present

## 2018-12-17 DIAGNOSIS — I10 Essential (primary) hypertension: Secondary | ICD-10-CM | POA: Diagnosis not present

## 2018-12-17 DIAGNOSIS — I671 Cerebral aneurysm, nonruptured: Secondary | ICD-10-CM | POA: Diagnosis not present

## 2019-02-11 DIAGNOSIS — D485 Neoplasm of uncertain behavior of skin: Secondary | ICD-10-CM | POA: Diagnosis not present

## 2019-02-11 DIAGNOSIS — L820 Inflamed seborrheic keratosis: Secondary | ICD-10-CM | POA: Diagnosis not present

## 2019-02-11 DIAGNOSIS — L821 Other seborrheic keratosis: Secondary | ICD-10-CM | POA: Diagnosis not present

## 2019-02-11 DIAGNOSIS — L720 Epidermal cyst: Secondary | ICD-10-CM | POA: Diagnosis not present

## 2019-04-25 ENCOUNTER — Other Ambulatory Visit: Payer: Self-pay | Admitting: Osteopathic Medicine

## 2019-06-26 ENCOUNTER — Other Ambulatory Visit: Payer: Self-pay | Admitting: Osteopathic Medicine

## 2019-07-17 ENCOUNTER — Encounter: Payer: Self-pay | Admitting: Osteopathic Medicine

## 2019-07-20 ENCOUNTER — Other Ambulatory Visit: Payer: Self-pay | Admitting: Osteopathic Medicine

## 2019-07-25 ENCOUNTER — Ambulatory Visit (INDEPENDENT_AMBULATORY_CARE_PROVIDER_SITE_OTHER): Payer: Medicare Other | Admitting: Osteopathic Medicine

## 2019-07-25 ENCOUNTER — Encounter: Payer: Self-pay | Admitting: Osteopathic Medicine

## 2019-07-25 VITALS — Temp 97.6°F | Wt 125.8 lb

## 2019-07-25 DIAGNOSIS — I10 Essential (primary) hypertension: Secondary | ICD-10-CM | POA: Diagnosis not present

## 2019-07-25 DIAGNOSIS — E782 Mixed hyperlipidemia: Secondary | ICD-10-CM

## 2019-07-25 DIAGNOSIS — F4321 Adjustment disorder with depressed mood: Secondary | ICD-10-CM

## 2019-07-25 DIAGNOSIS — J449 Chronic obstructive pulmonary disease, unspecified: Secondary | ICD-10-CM

## 2019-07-25 MED ORDER — CALCIUM-MAGNESIUM-VITAMIN D ER 600-40-500 MG-MG-UNIT PO TB24: 2.0000 | ORAL_TABLET | Freq: Every day | ORAL | 3 refills | Status: DC

## 2019-07-25 MED ORDER — VALACYCLOVIR HCL 1 G PO TABS: 2000.0000 mg | ORAL_TABLET | Freq: Two times a day (BID) | ORAL | 0 refills | Status: AC

## 2019-07-25 MED ORDER — HYDROCHLOROTHIAZIDE 12.5 MG PO TABS: 12.5000 mg | ORAL_TABLET | Freq: Every day | ORAL | 3 refills | Status: DC

## 2019-07-25 MED ORDER — IPRATROPIUM-ALBUTEROL 0.5-2.5 (3) MG/3ML IN SOLN: 3.0000 mL | RESPIRATORY_TRACT | 1 refills | Status: DC | PRN

## 2019-07-25 MED ORDER — CYCLOBENZAPRINE HCL 10 MG PO TABS: 5.0000 mg | ORAL_TABLET | Freq: Three times a day (TID) | ORAL | 1 refills | Status: DC | PRN

## 2019-07-25 MED ORDER — ALENDRONATE SODIUM 70 MG PO TABS: 70.0000 mg | ORAL_TABLET | ORAL | 3 refills | Status: DC

## 2019-07-25 MED ORDER — PRAVASTATIN SODIUM 40 MG PO TABS: 40.0000 mg | ORAL_TABLET | Freq: Every day | ORAL | 3 refills | Status: DC

## 2019-07-25 NOTE — Progress Notes (Signed)
Called pt - no answer. Left vm msg for pt at 858 am.

## 2019-07-25 NOTE — Progress Notes (Signed)
Virtual Visit via Video (App used: Doximity) Note  I connected with      Krista [name] on 07/25/19 at 9:52 AM  by a telemedicine application and verified that I am speaking with the correct person using two identifiers.  Patient is at home I am in office    I discussed the limitations of evaluation and management by telemedicine and the availability of in person appointments. The patient expressed understanding and agreed to proceed.  History of Present Illness: Krista [name] is a 68 y.o. female who would like to discuss medication refills   Patient overall doing well since last visit with the exception of recent unexpected death of her brother.  She states she is coping as best she can, does not feel she needs any medications or counseling at this time.  COPD: Symbicort was too expensive but patient feels fine off of medications other than as needed nebulizers, rare use of this medicine.  Hypertension: Blood pressure at home 130/86, typically runs in the 130s over 80s.  No chest pain, pressure, shortness of breath.  No headache or dizziness.   Cold sore: Gets a couple times a year, recent one has been particularly bad she thinks due to stress/grief      Observations/Objective: Temp 97.6 F (36.4 C) (Oral)   Wt 125 lb 12.8 oz (57.1 kg)   BMI 20.30 kg/m  BP Readings from Last 3 Encounters:  08/21/18 100/60  07/31/18 120/76  07/02/18 130/62   Exam: Normal Speech.  NAD  Lab and Radiology Results No results found for this or any previous visit (from the past 72 hour(s)). No results found.     Assessment and Plan: 67 y.o. female with The primary encounter diagnosis was Essential hypertension. Diagnoses of Mixed hyperlipidemia, Moderate COPD (chronic obstructive pulmonary disease) ([place]), and Grief reaction were also pertinent to this visit.  Abortive treatment for oral herpes sent, patient will think about whether she wants to be on suppressive dose of this.  PDMP  not reviewed this encounter. Orders Placed This Encounter  Procedures  . Pneumococcal polysaccharide vaccine 23-valent greater than or equal to 2yo subcutaneous/IM  . Flu vaccine HIGH DOSE PF (Fluzone High dose)  . Tdap vaccine greater than or equal to 7yo IM  . CBC  . COMPLETE METABOLIC PANEL WITH GFR  . Lipid panel   Meds ordered this encounter  Medications  . alendronate (FOSAMAX) 70 MG tablet    Sig: Take 1 tablet (70 mg total) by mouth once a week.    Dispense:  12 tablet    Refill:  3  . Calcium-Magnesium-Vitamin D 600-40-500 MG-MG-UNIT TB24    Sig: Take 2 tablets by mouth daily.    Dispense:  180 tablet    Refill:  3  . cyclobenzaprine (FLEXERIL) 10 MG tablet    Sig: Take 0.5-1 tablets (5-10 mg total) by mouth 3 (three) times daily as needed for muscle spasms.    Dispense:  90 tablet    Refill:  1  . hydrochlorothiazide (HYDRODIURIL) 12.5 MG tablet    Sig: Take 1 tablet (12.5 mg total) by mouth daily.    Dispense:  90 tablet    Refill:  3  . ipratropium-albuterol (DUONEB) 0.5-2.5 (3) MG/3ML SOLN    Sig: Take 3 mLs by nebulization every 2 (two) hours as needed (wheeze, SOB).    Dispense:  60 mL    Refill:  1  . pravastatin (PRAVACHOL) 40 MG tablet    Sig: Take  1 tablet (40 mg total) by mouth daily.    Dispense:  90 tablet    Refill:  3  . valACYclovir (VALTREX) 1000 MG tablet    Sig: Take 2 tablets (2,000 mg total) by mouth 2 (two) times daily for 1 day. As needed for cold sore outbreak    Dispense:  16 tablet    Refill:  0   There are no Patient Instructions on file for this visit.  Instructions sent via MyChart. If MyChart not available, pt was given option for info via personal e-mail w/ no guarantee of protected health info over unsecured e-mail communication, and MyChart sign-up instructions were included.   Follow Up Instructions: Return for Lab visit sometime in the next month or so, follow-up pend result / annual in the next 6 months.    I discussed the  assessment and treatment plan with the patient. The patient was provided an opportunity to ask questions and all were answered. The patient agreed with the plan and demonstrated an understanding of the instructions.   The patient was advised to call back or seek an in-person evaluation if any new concerns, if symptoms worsen or if the condition fails to improve as anticipated.  25 minutes of non-face-to-face time was provided during this encounter.                      Historical information moved to improve visibility of documentation.  Past Medical History:  Diagnosis Date  . Arthritis   . Bell palsy   . Brain aneurysm   . Hyperlipidemia   . Hypertension   . Osteoporosis    Past Surgical History:  Procedure Laterality Date  . ABDOMINAL HYSTERECTOMY    . CEREBRAL ANEURYSM REPAIR    . EYE SURGERY     cararact  . FOOT SURGERY    . HAND SURGERY    . MASTOIDECTOMY Left    Social History   Tobacco Use  . Smoking status: Former Smoker    Quit date: 12/05/2008    Years since quitting: 10.6  . Smokeless tobacco: Never Used  Substance Use Topics  . Alcohol use: No   family history includes Cancer in her father; Heart failure in her mother.  Medications: Current Outpatient Medications  Medication Sig Dispense Refill  . alendronate (FOSAMAX) 70 MG tablet Take 1 tablet (70 mg total) by mouth once a week. 12 tablet 3  . Calcium-Magnesium-Vitamin D 600-40-500 MG-MG-UNIT TB24 Take 2 tablets by mouth daily. 180 tablet 3  . cyclobenzaprine (FLEXERIL) 10 MG tablet Take 0.5-1 tablets (5-10 mg total) by mouth 3 (three) times daily as needed for muscle spasms. 90 tablet 1  . hydrochlorothiazide (HYDRODIURIL) 12.5 MG tablet Take 1 tablet (12.5 mg total) by mouth daily. 90 tablet 3  . ipratropium-albuterol (DUONEB) 0.5-2.5 (3) MG/3ML SOLN Take 3 mLs by nebulization every 2 (two) hours as needed (wheeze, SOB). 60 mL 1  . pravastatin (PRAVACHOL) 40 MG tablet Take 1 tablet (40  mg total) by mouth daily. 90 tablet 3  . valACYclovir (VALTREX) 1000 MG tablet Take 2 tablets (2,000 mg total) by mouth 2 (two) times daily for 1 day. As needed for cold sore outbreak 16 tablet 0   No current facility-administered medications for this visit.    Allergies  Allergen Reactions  . Codeine Other (See Comments)    Hyperactivity "wired" super hyperactivity    . Kiwi Extract Hives  . Other Other (See Comments), Hives and Rash  Nectarines--tongue swells  . Plum Pulp Hives  . Tomato Hives  . Prednisone     Irritability     PDMP not reviewed this encounter. Orders Placed This Encounter  Procedures  . Pneumococcal polysaccharide vaccine 23-valent greater than or equal to 2yo subcutaneous/IM  . Flu vaccine HIGH DOSE PF (Fluzone High dose)  . Tdap vaccine greater than or equal to 7yo IM  . CBC  . COMPLETE METABOLIC PANEL WITH GFR  . Lipid panel   Meds ordered this encounter  Medications  . alendronate (FOSAMAX) 70 MG tablet    Sig: Take 1 tablet (70 mg total) by mouth once a week.    Dispense:  12 tablet    Refill:  3  . Calcium-Magnesium-Vitamin D 600-40-500 MG-MG-UNIT TB24    Sig: Take 2 tablets by mouth daily.    Dispense:  180 tablet    Refill:  3  . cyclobenzaprine (FLEXERIL) 10 MG tablet    Sig: Take 0.5-1 tablets (5-10 mg total) by mouth 3 (three) times daily as needed for muscle spasms.    Dispense:  90 tablet    Refill:  1  . hydrochlorothiazide (HYDRODIURIL) 12.5 MG tablet    Sig: Take 1 tablet (12.5 mg total) by mouth daily.    Dispense:  90 tablet    Refill:  3  . ipratropium-albuterol (DUONEB) 0.5-2.5 (3) MG/3ML SOLN    Sig: Take 3 mLs by nebulization every 2 (two) hours as needed (wheeze, SOB).    Dispense:  60 mL    Refill:  1  . pravastatin (PRAVACHOL) 40 MG tablet    Sig: Take 1 tablet (40 mg total) by mouth daily.    Dispense:  90 tablet    Refill:  3  . valACYclovir (VALTREX) 1000 MG tablet    Sig: Take 2 tablets (2,000 mg total) by  mouth 2 (two) times daily for 1 day. As needed for cold sore outbreak    Dispense:  16 tablet    Refill:  0

## 2019-08-02 DIAGNOSIS — I10 Essential (primary) hypertension: Secondary | ICD-10-CM | POA: Diagnosis not present

## 2019-08-02 DIAGNOSIS — J449 Chronic obstructive pulmonary disease, unspecified: Secondary | ICD-10-CM | POA: Diagnosis not present

## 2019-08-02 DIAGNOSIS — F4321 Adjustment disorder with depressed mood: Secondary | ICD-10-CM | POA: Diagnosis not present

## 2019-08-02 DIAGNOSIS — E782 Mixed hyperlipidemia: Secondary | ICD-10-CM | POA: Diagnosis not present

## 2019-08-03 LAB — COMPLETE METABOLIC PANEL WITH GFR
AG Ratio: 1.9 (calc) (ref 1.0–2.5)
ALT: 14 U/L (ref 6–29)
AST: 20 U/L (ref 10–35)
Albumin: 4.6 g/dL (ref 3.6–5.1)
Alkaline phosphatase (APISO): 52 U/L (ref 37–153)
BUN: 12 mg/dL (ref 7–25)
CO2: 26 mmol/L (ref 20–32)
Calcium: 10.2 mg/dL (ref 8.6–10.4)
Chloride: 101 mmol/L (ref 98–110)
Creat: 0.71 mg/dL (ref 0.50–0.99)
GFR, Est African American: 101 mL/min/{1.73_m2} (ref >=60)
GFR, Est Non African American: 88 mL/min/{1.73_m2} (ref >=60)
Globulin: 2.4 g/dL (calc) (ref 1.9–3.7)
Glucose, Bld: 94 mg/dL (ref 65–99)
Potassium: 3.9 mmol/L (ref 3.5–5.3)
Sodium: 138 mmol/L (ref 135–146)
Total Bilirubin: 0.5 mg/dL (ref 0.2–1.2)
Total Protein: 7 g/dL (ref 6.1–8.1)

## 2019-08-03 LAB — CBC
HCT: 38.7 % (ref 35.0–45.0)
Hemoglobin: 12.8 g/dL (ref 11.7–15.5)
MCH: 31.4 pg (ref 27.0–33.0)
MCHC: 33.1 g/dL (ref 32.0–36.0)
MCV: 95.1 fL (ref 80.0–100.0)
MPV: 8.7 fL (ref 7.5–12.5)
Platelets: 248 10*3/uL (ref 140–400)
RBC: 4.07 10*6/uL (ref 3.80–5.10)
RDW: 13 % (ref 11.0–15.0)
WBC: 5.7 10*3/uL (ref 3.8–10.8)

## 2019-08-03 LAB — LIPID PANEL
Cholesterol: 249 mg/dL — ABNORMAL HIGH (ref <200)
HDL: 68 mg/dL (ref >=50)
LDL Cholesterol (Calc): 150 mg/dL (calc) — ABNORMAL HIGH
Non-HDL Cholesterol (Calc): 181 mg/dL (calc) — ABNORMAL HIGH (ref <130)
Total CHOL/HDL Ratio: 3.7 (calc) (ref <5.0)
Triglycerides: 168 mg/dL — ABNORMAL HIGH (ref <150)

## 2019-08-23 DIAGNOSIS — Z23 Encounter for immunization: Secondary | ICD-10-CM | POA: Diagnosis not present

## 2019-08-28 ENCOUNTER — Ambulatory Visit: Payer: Medicare Other

## 2019-09-02 NOTE — Progress Notes (Signed)
Subjective:   Krista [name] is a 68 y.o. female who presents for Medicare Annual (Subsequent) preventive examination.  Review of Systems:  No ROS.  Medicare Wellness Virtual Visit.  Visual/audio telehealth visit, UTA vital signs.   See social history for additional risk factors.    Cardiac Risk Factors include: advanced age (>33men, >43 women);dyslipidemia;hypertension Sleep patterns: Getting 10 hours of sleep a night sometimes 8 hours. Wakes up 1 time a night to void. Wakes up and feels refreshed and ready for the day   Home Safety/Smoke Alarms: Feels safe in home. Smoke alarms in place.  Living environment; Lives with husband in a 1 story home no steps in or around the house. Shower is a step in tub with anti slip mat in place  Seat Belt Safety/Bike Helmet: Wears seat belt.   Female:   Pap- Aged out      Mammo-  ordered     Dexa scan-  UTD       CCS- UTD     Objective:     Vitals: There were no vitals taken for this visit.  There is no height or weight on file to calculate BMI.  Advanced Directives 09/03/2019 08/21/2018 07/06/2018 01/08/2018  Does Patient Have a Medical Advance Directive? Yes Yes Yes Yes  Type of Paramedic of [place];Living will [place];Living will;Out of facility DNR (pink MOST or yellow form) [place];Living will [place];Living will  Does patient want to make changes to medical advance directive? No - Patient declined No - Patient declined No - Patient declined -  Copy of [place] in Chart? No - copy requested No - copy requested No - copy requested No - copy requested    Tobacco Social History   Tobacco Use  Smoking Status Former Smoker  . Quit date: 12/05/2008  . Years since quitting: 10.7  Smokeless Tobacco Never Used     Counseling given: Not Answered   Clinical Intake:  Pre-visit preparation completed: Yes  Pain : 0-10 Pain Score: 7   Pain Type: Chronic pain Pain Location: Hip Pain Orientation: Lower, Left Pain Descriptors / Indicators: Throbbing Pain Onset: More than a month ago Pain Frequency: Intermittent Pain Relieving Factors: radiating down left leg  Pain Relieving Factors: radiating down left leg  Nutritional Risks: None Diabetes: No  How often do you need to have someone help you when you read instructions, pamphlets, or other written materials from your doctor or pharmacy?: 1 - Never What is the last grade level you completed in school?: 14  Interpreter Needed?: No  Information entered by :: Orlie Dakin, LPN  Past Medical History:  Diagnosis Date  . Arthritis   . Bell palsy   . Brain aneurysm   . Hyperlipidemia   . Hypertension   . Osteoporosis    Past Surgical History:  Procedure Laterality Date  . ABDOMINAL HYSTERECTOMY    . CEREBRAL ANEURYSM REPAIR    . EYE SURGERY     cararact  . FOOT SURGERY    . HAND SURGERY    . MASTOIDECTOMY Left    Family History  Problem Relation Age of Onset  . Heart failure Mother   . Cancer Father   . Cancer Brother    Social History   Socioeconomic History  . Marital status: Married    Spouse name: Randal  . Number of children: 2  . Years of education: 61  . Highest education  level: 12th grade  Occupational History  . Occupation: retired    Comment: Retail buyer  Social Needs  . Financial resource strain: Not hard at all  . Food insecurity    Worry: Never true    Inability: Never true  . Transportation needs    Medical: No    Non-medical: No  Tobacco Use  . Smoking status: Former Smoker    Quit date: 12/05/2008    Years since quitting: 10.7  . Smokeless tobacco: Never Used  Substance and Sexual Activity  . Alcohol use: No  . Drug use: No  . Sexual activity: Not Currently  Lifestyle  . Physical activity    Days per week: 5 days    Minutes per session: 30 min  . Stress: Not at all  Relationships  . Social connections     Talks on phone: More than three times a week    Gets together: Once a week    Attends religious service: Never    Active member of club or organization: Yes    Attends meetings of clubs or organizations: More than 4 times per year    Relationship status: Married  Other Topics Concern  . Not on file  Social History Narrative   Walks 3 times a week    Outpatient Encounter Medications as of 09/03/2019  Medication Sig  . alendronate (FOSAMAX) 70 MG tablet Take 1 tablet (70 mg total) by mouth once a week.  . Calcium-Magnesium-Vitamin D [id number] MG-MG-UNIT TB24 Take 2 tablets by mouth daily.  . cyclobenzaprine (FLEXERIL) 10 MG tablet Take 0.5-1 tablets (5-10 mg total) by mouth 3 (three) times daily as needed for muscle spasms.  . diphenhydramine-acetaminophen (TYLENOL PM) 25-500 MG TABS tablet Take 1 tablet by mouth at bedtime as needed.  . hydrochlorothiazide (HYDRODIURIL) 12.5 MG tablet Take 1 tablet (12.5 mg total) by mouth daily.  Marland Kitchen ipratropium-albuterol (DUONEB) 0.5-2.5 (3) MG/3ML SOLN Take 3 mLs by nebulization every 2 (two) hours as needed (wheeze, SOB).  Marland Kitchen loratadine (CLARITIN) 10 MG tablet Take 10 mg by mouth daily.  . pravastatin (PRAVACHOL) 40 MG tablet Take 1 tablet (40 mg total) by mouth daily.   No facility-administered encounter medications on file as of 09/03/2019.     Activities of Daily Living In your present state of health, do you have any difficulty performing the following activities: 09/03/2019  Hearing? (No Data)  Comment wears hearing aids bilaterally  Vision? Y  Comment seeing floaters advised to call eye doctor  Difficulty concentrating or making decisions? N  Walking or climbing stairs? Y  Comment hip pain with going up stairs  Dressing or bathing? N  Doing errands, shopping? N  Preparing Food and eating ? N  Using the Toilet? N  In the past six months, have you accidently leaked urine? N  Do you have problems with loss of bowel control? N  Managing your  Medications? N  Managing your Finances? N  Housekeeping or managing your Housekeeping? N  Some recent data might be hidden    Patient Care Team: Emeterio Reeve, DO as PCP - General (Osteopathic Medicine)    Assessment:   This is a routine wellness examination for [facility].Physical assessment deferred to PCP.   Exercise Activities and Dietary recommendations Current Exercise Habits: Home exercise routine, Type of exercise: walking, Time (Minutes): 30, Frequency (Times/Week): 5, Weekly Exercise (Minutes/Week): 150, Intensity: Mild Diet Eats a healthy diet of vegetables fruits and proteins. Breakfast: cereal with fruit Lunch: [place] on whole wheat  and fruit Dinner: Meat and 2 vegetables      Goals    . Exercise 3x per week (30 min per time)     Going to join [employer] and will start doing some aerobics there.You are doing so good with your diet and exercise routine       Fall Risk Fall Risk  09/03/2019 08/21/2018 08/15/2017  Falls in the past year? 0 No No  Number falls in past yr: 0 - -  Injury with Fall? 0 - -  Follow up Falls prevention discussed - -   Is the patient's home free of loose throw rugs in walkways, pet beds, electrical cords, etc?   yes      Grab bars in the bathroom? no      Handrails on the stairs?   no      Adequate lighting?   yes   Depression Screen PHQ 2/9 Scores 09/03/2019 07/25/2019 08/21/2018 05/10/2018  PHQ - 2 Score 0 1 0 0  PHQ- 9 Score 2 9 - 0     Cognitive Function     6CIT Screen 09/03/2019 08/21/2018  What Year? 0 points 0 points  What month? 0 points 0 points  What time? 0 points 0 points  Count back from 20 0 points 0 points  Months in reverse 0 points 0 points  Repeat phrase 2 points 0 points  Total Score 2 0    Immunization History  Administered Date(s) Administered  . Influenza, High Dose Seasonal PF 08/15/2017, 08/24/2018    Screening Tests Health Maintenance  Topic Date Due  . Hepatitis C Screening  August 26, 1951  .  TETANUS/TDAP  07/18/1970  . PNA vac Low Risk Adult (1 of 2 - PCV13) 07/18/2016  . INFLUENZA VACCINE  07/06/2019  . MAMMOGRAM  08/31/2019  . COLONOSCOPY  09/14/2027  . DEXA SCAN  Completed       Plan:      Ms. Eleby , Thank you for taking time to come for your Medicare Wellness Visit. I appreciate your ongoing commitment to your health goals. Please review the following plan we discussed and let me know if I can assist you in the future.  Please schedule your next medicare wellness visit with me in 1 yr. Continue doing brain stimulating activities (puzzles, reading, adult coloring books, staying active) to keep memory sharp.    These are the goals we discussed: Goals    . Exercise 3x per week (30 min per time)     Going to join [employer] and will start doing some aerobics there.You are doing so good with your diet and exercise routine       This is a list of the screening recommended for you and due dates:  Health Maintenance  Topic Date Due  . Mammogram  08/31/2019  . Tetanus Vaccine  09/02/2020*  . Pneumonia vaccines (2 of 2 - PCV13) 09/02/2020  . Colon Cancer Screening  09/14/2027  . Flu Shot  Completed  . DEXA scan (bone density measurement)  Completed  .  Hepatitis C: One time screening is recommended by Center for Disease Control  (CDC) for  adults born from [birth year] through 1965.   Completed  *Topic was postponed. The date shown is not the original due date.      I have personally reviewed and noted the following in the patient's chart:   . Medical and social history . Use of alcohol, tobacco or illicit drugs  . Current medications and  supplements . Functional ability and status . Nutritional status . Physical activity . Advanced directives . List of other physicians . Hospitalizations, surgeries, and ER visits in previous 12 months . Vitals . Screenings to include cognitive, depression, and falls . Referrals and appointments  In addition, I have reviewed and  discussed with patient certain preventive protocols, quality metrics, and best practice recommendations. A written personalized care plan for preventive services as well as general preventive health recommendations were provided to patient.     Joanne Chars, LPN  [id number]

## 2019-09-03 ENCOUNTER — Other Ambulatory Visit: Payer: Self-pay

## 2019-09-03 ENCOUNTER — Other Ambulatory Visit: Payer: Self-pay | Admitting: Osteopathic Medicine

## 2019-09-03 ENCOUNTER — Ambulatory Visit (INDEPENDENT_AMBULATORY_CARE_PROVIDER_SITE_OTHER): Payer: Medicare Other | Admitting: *Deleted

## 2019-09-03 VITALS — BP 109/58 | HR 72 | Temp 98.2°F | Ht 65.0 in | Wt 130.0 lb

## 2019-09-03 DIAGNOSIS — Z23 Encounter for immunization: Secondary | ICD-10-CM | POA: Diagnosis not present

## 2019-09-03 DIAGNOSIS — Z1159 Encounter for screening for other viral diseases: Secondary | ICD-10-CM | POA: Diagnosis not present

## 2019-09-03 DIAGNOSIS — Z780 Asymptomatic menopausal state: Secondary | ICD-10-CM | POA: Diagnosis not present

## 2019-09-03 DIAGNOSIS — Z9189 Other specified personal risk factors, not elsewhere classified: Secondary | ICD-10-CM | POA: Diagnosis not present

## 2019-09-03 DIAGNOSIS — Z803 Family history of malignant neoplasm of breast: Secondary | ICD-10-CM

## 2019-09-03 DIAGNOSIS — Z1239 Encounter for other screening for malignant neoplasm of breast: Secondary | ICD-10-CM | POA: Diagnosis not present

## 2019-09-03 DIAGNOSIS — J069 Acute upper respiratory infection, unspecified: Secondary | ICD-10-CM

## 2019-09-03 MED ORDER — ZOSTER VAC RECOMB ADJUVANTED 50 MCG/0.5ML IM SUSR: 0.5000 mL | Freq: Once | INTRAMUSCULAR | 0 refills | Status: AC

## 2019-09-03 NOTE — Patient Instructions (Addendum)
Ms. Scantlebury , Thank you for taking time to come for your Medicare Wellness Visit. I appreciate your ongoing commitment to your health goals. Please review the following plan we discussed and let me know if I can assist you in the future.  Please schedule your next medicare wellness visit with me in 1 yr. Continue doing brain stimulating activities (puzzles, reading, adult coloring books, staying active) to keep memory sharp. These are the goals we discussed: Goals    . Exercise 3x per week (30 min per time)     Going to join [employer] and will start doing some aerobics there.You are doing so good with your diet and exercise routine

## 2019-09-03 NOTE — Telephone Encounter (Signed)
[facility] requesting med refill for ipratropium nasal spray. Rx not listed in active med list. Pls advise, thanks.

## 2019-09-03 NOTE — Telephone Encounter (Signed)
Requested medication (s) are due for refill today: no  Requested medication (s) are on the active medication list: yes   Future visit scheduled: yes  Notes to clinic:  Medication was discontinued    Requested Prescriptions  Pending Prescriptions Disp Refills   ipratropium (ATROVENT) 0.06 % nasal spray [Pharmacy Med Name: IPRATROPIUM 0.06% SPRAY] 15 mL 1    Sig: Place 2 sprays into both nostrils 4 (four) times daily.     Off-Protocol Failed - 09/03/2019 10:03 AM      Failed - Medication not assigned to a protocol, review manually.      Failed - Valid encounter within last 12 months    Recent Outpatient Visits          1 month ago Essential hypertension   [facility], [place], DO   1 year ago Moderate COPD (chronic obstructive pulmonary disease) [facility])   [facility] [place], Lanelle Bal, DO   1 year ago Cough   [facility] [place], Lanelle Bal, DO   1 year ago Cough   [place] [facility] [place], Lanelle Bal, DO   1 year ago Cough   [facility] [place], Lanelle Bal, DO      Future Appointments            Today [facility]           Off-Protocol Failed - 09/03/2019 10:03 AM      Failed - Medication not assigned to a protocol, review manually.      Failed - Valid encounter within last 12 months    Recent Outpatient Visits          1 month ago Essential hypertension   [place] [facility], [place], DO   1 year ago Moderate COPD (chronic obstructive pulmonary disease) [facility])   [facility] [place], Lanelle Bal, DO   1 year ago Cough   [place] [facility] [place], Lanelle Bal, DO   1 year ago Cough   [place] [facility] [place], Lanelle Bal, DO   1 year ago Cough    [facility] [place], Lanelle Bal, DO      Future Appointments            Today [facility]

## 2019-09-04 LAB — HEPATITIS C ANTIBODY
Hepatitis C Ab: NONREACTIVE
SIGNAL TO CUT-OFF: 0.01 (ref <1.00)

## 2019-09-04 NOTE — Progress Notes (Signed)
Negative hep C screen  

## 2019-09-18 ENCOUNTER — Other Ambulatory Visit: Payer: Self-pay | Admitting: Osteopathic Medicine

## 2019-09-18 NOTE — Telephone Encounter (Signed)
Requested medication (s) are due for refill today: yes  Requested medication (s) are on the active medication list: yes  Last refill:  08/21/2019  Future visit scheduled: no  Notes to clinic: refill cannot be delegated    Requested Prescriptions  Pending Prescriptions Disp Refills   cyclobenzaprine (FLEXERIL) 10 MG tablet [Pharmacy Med Name: CYCLOBENZAPRINE 10 MG TABLET] 90 tablet 1    Sig: Take 0.5-1 tablets (5-10 mg total) by mouth 3 (three) times daily as needed for muscle spasms.     Not Delegated - Analgesics:  Muscle Relaxants Failed - 09/18/2019  9:07 AM      Failed - This refill cannot be delegated      Passed - Valid encounter within last 6 months    Recent Outpatient Visits          1 month ago Essential hypertension   [place], Lanelle Bal, DO   1 year ago Moderate COPD (chronic obstructive pulmonary disease) [facility])   [place] [facility] [place], Lanelle Bal, DO   1 year ago Cough   [place] [facility], Lanelle Bal, DO   1 year ago Cough   [place] [facility] [place], Lanelle Bal, DO   1 year ago Cough   [place] [facility] [place], Lanelle Bal, DO      Future Appointments            In 11 months [place] [facility]

## 2019-10-02 ENCOUNTER — Other Ambulatory Visit: Payer: Self-pay | Admitting: Osteopathic Medicine

## 2019-10-02 DIAGNOSIS — Z1231 Encounter for screening mammogram for malignant neoplasm of breast: Secondary | ICD-10-CM

## 2019-10-09 ENCOUNTER — Other Ambulatory Visit: Payer: Self-pay

## 2019-10-09 ENCOUNTER — Ambulatory Visit (INDEPENDENT_AMBULATORY_CARE_PROVIDER_SITE_OTHER): Payer: Medicare Other

## 2019-10-09 DIAGNOSIS — Z1231 Encounter for screening mammogram for malignant neoplasm of breast: Secondary | ICD-10-CM | POA: Diagnosis not present

## 2019-11-24 ENCOUNTER — Encounter: Payer: Self-pay | Admitting: Osteopathic Medicine

## 2019-11-25 ENCOUNTER — Other Ambulatory Visit: Payer: Self-pay

## 2019-11-25 ENCOUNTER — Emergency Department (INDEPENDENT_AMBULATORY_CARE_PROVIDER_SITE_OTHER)
Admission: EM | Admit: 2019-11-25 | Discharge: 2019-11-25 | Disposition: A | Discharge status: Home / Self Care | Payer: Medicare Other | Source: Home / Self Care

## 2019-11-25 ENCOUNTER — Encounter: Payer: Self-pay | Admitting: Emergency Medicine

## 2019-11-25 DIAGNOSIS — J029 Acute pharyngitis, unspecified: Secondary | ICD-10-CM | POA: Diagnosis not present

## 2019-11-25 DIAGNOSIS — Z8719 Personal history of other diseases of the digestive system: Secondary | ICD-10-CM

## 2019-11-25 LAB — POCT RAPID STREP A (OFFICE): Rapid Strep A Screen: NEGATIVE

## 2019-11-25 MED ORDER — VALACYCLOVIR HCL 1 G PO TABS: 1000.0000 mg | ORAL_TABLET | Freq: Two times a day (BID) | ORAL | 1 refills | Status: DC

## 2019-11-25 MED ORDER — CHLORHEXIDINE GLUCONATE 0.12 % MT SOLN: 15.0000 mL | Freq: Two times a day (BID) | OROMUCOSAL | 0 refills | Status: DC

## 2019-11-25 NOTE — ED Triage Notes (Addendum)
Sore throat x 3 days, LT ear pain, fatigue

## 2019-11-25 NOTE — ED Provider Notes (Signed)
[name]    CSN: [id number] Arrival date & time: 11/25/19  1330      History   Chief Complaint Chief Complaint  Patient presents with  . Sore Throat    HPI Krista [name] is a 68 y.o. female.   Established [facility] patient   Sore throat x 3 days, LT ear pain, fatigue.  No cough or GI symptoms.  Cares for two grandchildren.  Also is developing recurrent cold sores on left upper lip       Past Medical History:  Diagnosis Date  . Arthritis   . Bell palsy   . Brain aneurysm   . Hyperlipidemia   . Hypertension   . Osteoporosis     Patient Active Problem List   Diagnosis Date Noted  . Mucoid cyst of left fifth DIP 07/31/2018  . Acute bronchitis with COPD ([place]) 05/10/2018  . Impacted cerumen of left ear 03/07/2018  . History of left mastoidectomy 03/07/2018  . Tympanosclerosis, left ear 03/07/2018  . Cardiac risk counseling 09/05/2017  . Essential hypertension 09/05/2017  . Mixed hyperlipidemia 09/05/2017  . History of Bell's palsy 08/15/2017  . History of osteoporosis 08/15/2017  . History of colon polyps 08/15/2017  . Diarrhea 08/15/2017  . History of nonmelanoma skin cancer 08/15/2017  . Brain aneurysm 08/15/2017  . Bilateral hearing loss 08/15/2017  . H/O hysterectomy for benign disease 08/15/2017    Past Surgical History:  Procedure Laterality Date  . ABDOMINAL HYSTERECTOMY    . CEREBRAL ANEURYSM REPAIR    . EYE SURGERY     cararact  . FOOT SURGERY    . HAND SURGERY    . MASTOIDECTOMY Left     OB History   No obstetric history on file.      Home Medications    Prior to Admission medications   Medication Sig Start Date End Date Taking? Authorizing Provider  alendronate (FOSAMAX) 70 MG tablet Take 1 tablet (70 mg total) by mouth once a week. 07/25/19   Emeterio Reeve, DO  Calcium-Magnesium-Vitamin D 600-40-500 MG-MG-UNIT TB24 Take 2 tablets by mouth daily. 07/25/19   Emeterio Reeve, DO  chlorhexidine (PERIDEX) 0.12 %  solution Use as directed 15 mLs in the mouth or throat 2 (two) times daily. 11/25/19   Robyn Haber, MD  cyclobenzaprine (FLEXERIL) 10 MG tablet TAKE 0.5-1 TABLETS (5-10 MG TOTAL) BY MOUTH 3 (THREE) TIMES DAILY AS NEEDED FOR MUSCLE SPASMS. 09/18/19   Emeterio Reeve, DO  diphenhydramine-acetaminophen (TYLENOL PM) 25-500 MG TABS tablet Take 1 tablet by mouth at bedtime as needed.    [provider]  hydrochlorothiazide (HYDRODIURIL) 12.5 MG tablet Take 1 tablet (12.5 mg total) by mouth daily. 07/25/19   Emeterio Reeve, DO  loratadine (CLARITIN) 10 MG tablet Take 10 mg by mouth daily.    [provider]  pravastatin (PRAVACHOL) 40 MG tablet Take 1 tablet (40 mg total) by mouth daily. 07/25/19   Emeterio Reeve, DO  valACYclovir (VALTREX) 1000 MG tablet Take 1 tablet (1,000 mg total) by mouth 2 (two) times daily. 11/25/19   Robyn Haber, MD    Family History Family History  Problem Relation Age of Onset  . Heart failure Mother   . Cancer Father   . Cancer Brother     Social History Social History   Tobacco Use  . Smoking status: Former Smoker    Quit date: 12/05/2008    Years since quitting: 10.9  . Smokeless tobacco: Never Used  Substance Use Topics  .  Alcohol use: No  . Drug use: No     Allergies   Codeine, Kiwi extract, Other, Plum pulp, Tomato, and Prednisone   Review of Systems Review of Systems  Constitutional: Positive for fatigue. Negative for fever.  HENT: Positive for ear pain and sore throat.   Respiratory: Negative for cough.   Cardiovascular: Negative.   Gastrointestinal: Negative for diarrhea.  Musculoskeletal: Negative for myalgias.  All other systems reviewed and are negative.    Physical Exam Triage Vital Signs ED Triage Vitals  Enc Vitals Group     BP 11/25/19 1423 133/86     Pulse Rate 11/25/19 1423 87     Resp 11/25/19 1423 18     Temp 11/25/19 1423 98.3 F (36.8 C)     Temp Source 11/25/19 1423 Oral     SpO2  11/25/19 1423 98 %     Weight 11/25/19 1424 130 lb (59 kg)     Height 11/25/19 1424 5\' 5"  (1.651 m)     Head Circumference --      Peak Flow --      Pain Score 11/25/19 1423 7     Pain Loc --      Pain Edu? --      Excl. in [place]? --    No data found.  Updated Vital Signs BP 133/86 (BP Location: Right Arm)   Pulse 87   Temp 98.3 F (36.8 C) (Oral)   Resp 18   Ht 5\' 5"  (1.651 m)   Wt 59 kg   SpO2 98%   BMI 21.63 kg/m    Physical Exam Vitals and nursing note reviewed.  Constitutional:      Appearance: She is well-developed.  HENT:     Head: Normocephalic and atraumatic.     Right Ear: Tympanic membrane normal.     Left Ear: Tympanic membrane normal.     Mouth/Throat:     Comments: Two aphthous ulcers in mouth, one on floor and one on left tonsillar pillar Eyes:     Conjunctiva/sclera: Conjunctivae normal.  Cardiovascular:     Rate and Rhythm: Normal rate.  Pulmonary:     Effort: Pulmonary effort is normal.  Musculoskeletal:     Cervical back: Normal range of motion and neck supple.  Skin:    General: Skin is warm.  Neurological:     General: No focal deficit present.     Mental Status: She is alert.  Psychiatric:        Mood and Affect: Mood normal.      UC Treatments / Results  Labs (all labs ordered are listed, but only abnormal results are displayed) Labs Reviewed  POCT RAPID STREP A (OFFICE)    EKG   Radiology No results found.  Procedures Procedures (including critical care time)  Medications Ordered in UC Medications - No data to display  Initial Impression / Assessment and Plan / UC Course  I have reviewed the triage vital signs and the nursing notes.  Pertinent labs & imaging results that were available during my care of the patient were reviewed by me and considered in my medical decision making (see chart for details).    Final Clinical Impressions(s) / UC Diagnoses   Final diagnoses:  Acute pharyngitis, unspecified etiology    History of oral aphthous ulcers   Discharge Instructions   None    ED Prescriptions    Medication Sig Dispense Auth. Provider   valACYclovir (VALTREX) 1000 MG tablet Take 1 tablet (1,000  mg total) by mouth 2 (two) times daily. 4 tablet Robyn Haber, MD   chlorhexidine (PERIDEX) 0.12 % solution Use as directed 15 mLs in the mouth or throat 2 (two) times daily. 120 mL Robyn Haber, MD     I have reviewed the PDMP during this encounter.   Robyn Haber, MD 11/25/19 [phone]

## 2020-01-04 DIAGNOSIS — Z20828 Contact with and (suspected) exposure to other viral communicable diseases: Secondary | ICD-10-CM | POA: Diagnosis not present

## 2020-01-07 ENCOUNTER — Other Ambulatory Visit: Payer: Self-pay | Admitting: Physician Assistant

## 2020-01-07 ENCOUNTER — Other Ambulatory Visit: Payer: Self-pay | Admitting: Osteopathic Medicine

## 2020-01-07 DIAGNOSIS — J069 Acute upper respiratory infection, unspecified: Secondary | ICD-10-CM

## 2020-02-04 ENCOUNTER — Other Ambulatory Visit: Payer: Self-pay | Admitting: Osteopathic Medicine

## 2020-02-04 DIAGNOSIS — J069 Acute upper respiratory infection, unspecified: Secondary | ICD-10-CM

## 2020-02-06 ENCOUNTER — Other Ambulatory Visit: Payer: Self-pay | Admitting: Osteopathic Medicine

## 2020-02-06 DIAGNOSIS — J069 Acute upper respiratory infection, unspecified: Secondary | ICD-10-CM

## 2020-02-08 ENCOUNTER — Other Ambulatory Visit: Payer: Self-pay | Admitting: Osteopathic Medicine

## 2020-02-25 ENCOUNTER — Other Ambulatory Visit: Payer: Self-pay

## 2020-02-25 DIAGNOSIS — J069 Acute upper respiratory infection, unspecified: Secondary | ICD-10-CM

## 2020-02-25 DIAGNOSIS — E782 Mixed hyperlipidemia: Secondary | ICD-10-CM

## 2020-02-25 MED ORDER — PRAVASTATIN SODIUM 40 MG PO TABS: 40.0000 mg | ORAL_TABLET | Freq: Every day | ORAL | 1 refills | Status: DC

## 2020-02-25 MED ORDER — HYDROCHLOROTHIAZIDE 12.5 MG PO TABS: 12.5000 mg | ORAL_TABLET | Freq: Every day | ORAL | 1 refills | Status: DC

## 2020-02-25 MED ORDER — CYCLOBENZAPRINE HCL 10 MG PO TABS: ORAL_TABLET | ORAL | 0 refills | Status: DC

## 2020-02-25 MED ORDER — IPRATROPIUM BROMIDE 0.06 % NA SOLN: 2.0000 | Freq: Four times a day (QID) | NASAL | 4 refills | Status: DC

## 2020-03-11 ENCOUNTER — Ambulatory Visit (INDEPENDENT_AMBULATORY_CARE_PROVIDER_SITE_OTHER): Payer: Medicare HMO

## 2020-03-11 ENCOUNTER — Telehealth: Payer: Self-pay | Admitting: Osteopathic Medicine

## 2020-03-11 ENCOUNTER — Other Ambulatory Visit: Payer: Self-pay

## 2020-03-11 ENCOUNTER — Ambulatory Visit (INDEPENDENT_AMBULATORY_CARE_PROVIDER_SITE_OTHER): Payer: Medicare HMO | Admitting: Sports Medicine

## 2020-03-11 ENCOUNTER — Ambulatory Visit (INDEPENDENT_AMBULATORY_CARE_PROVIDER_SITE_OTHER): Payer: Medicare HMO | Admitting: Osteopathic Medicine

## 2020-03-11 ENCOUNTER — Encounter: Payer: Self-pay | Admitting: Osteopathic Medicine

## 2020-03-11 VITALS — BP 138/85 | HR 91 | Temp 98.4°F | Wt 134.1 lb

## 2020-03-11 DIAGNOSIS — M6740 Ganglion, unspecified site: Secondary | ICD-10-CM

## 2020-03-11 DIAGNOSIS — Z8739 Personal history of other diseases of the musculoskeletal system and connective tissue: Secondary | ICD-10-CM | POA: Diagnosis not present

## 2020-03-11 DIAGNOSIS — M7989 Other specified soft tissue disorders: Secondary | ICD-10-CM | POA: Diagnosis not present

## 2020-03-11 DIAGNOSIS — K2100 Gastro-esophageal reflux disease with esophagitis, without bleeding: Secondary | ICD-10-CM

## 2020-03-11 DIAGNOSIS — M79645 Pain in left finger(s): Secondary | ICD-10-CM | POA: Diagnosis not present

## 2020-03-11 DIAGNOSIS — M818 Other osteoporosis without current pathological fracture: Secondary | ICD-10-CM

## 2020-03-11 MED ORDER — FAMOTIDINE 40 MG PO TABS: 40.0000 mg | ORAL_TABLET | Freq: Every day | ORAL | 0 refills | Status: DC

## 2020-03-11 MED ORDER — MELOXICAM 15 MG PO TABS: ORAL_TABLET | ORAL | 3 refills | Status: DC

## 2020-03-11 MED ORDER — PANTOPRAZOLE SODIUM 40 MG PO TBEC: 40.0000 mg | DELAYED_RELEASE_TABLET | Freq: Two times a day (BID) | ORAL | 0 refills | Status: DC

## 2020-03-11 NOTE — Progress Notes (Signed)
Krista [name] is a 69 y.o. female who presents to  [place] at [facility]  today, 03/11/20, seeking care for the following: . Hand pain - pinky finger - referred to sports med  . GERD . Would like Prolia instead of Fosamax, has stopped this a few mos ago      [place] with other pertinent history/findings:  The primary encounter diagnosis was History of osteoporosis. A diagnosis of Gastroesophageal reflux disease with esophagitis, unspecified whether hemorrhage was also pertinent to this visit.  Recent PPI use precludes H Pylori breath test No swallowing issues No CP/SOB Would have low threshold for GI referral  Will work on getting Prolia - unable to tolerate fosamax   Meds ordered this encounter  Medications  . pantoprazole (PROTONIX) 40 MG tablet    Sig: Take 1 tablet (40 mg total) by mouth 2 (two) times daily.    Dispense:  180 tablet    Refill:  0  . famotidine (PEPCID) 40 MG tablet    Sig: Take 1 tablet (40 mg total) by mouth at bedtime.    Dispense:  90 tablet    Refill:  0       Follow-up instructions: Return for ANNUAL (call week prior to visit for lab orders) AROUND 07/2020.                                         BP 138/85 (BP Location: Left Arm, Patient Position: Sitting, Cuff Size: Normal)   Pulse 91   Temp 98.4 F (36.9 C) (Oral)   Wt 134 lb 1.3 oz (60.8 kg)   BMI 22.31 kg/m   No outpatient medications have been marked as taking for the 03/11/20 encounter (Office Visit) with Emeterio Reeve, DO.    No results found for this or any previous visit (from the past 72 hour(s)).  DG Finger [place] Left  Result Date: 03/12/2020 CLINICAL DATA:  Pain and swelling. EXAM: LEFT LITTLE FINGER 2+V COMPARISON:  No prior. FINDINGS: Prominent loss of joint space with prominent erosive changes noted about the distal interphalangeal joint of the left fifth digit. Findings  consistent with inflammatory or infectious arthritis. No evidence of fracture dislocation. No radiopaque foreign body. IMPRESSION: Prominent loss of joint space with prominent erosive changes noted about the distal interphalangeal joint of the left fifth digit. Findings consistent with inflammatory or infectious arthritis. Electronically Signed   By: Marcello Moores  Register   On: 03/12/2020 08:12    Depression screen [facility] 2/9 09/03/2019 07/25/2019 08/21/2018  Decreased Interest 0 0 0  Down, Depressed, Hopeless 0 1 0  PHQ - 2 Score 0 1 0  Altered sleeping 1 3 -  Tired, decreased energy 0 3 -  Change in appetite 0 0 -  Feeling bad or failure about yourself  0 0 -  Trouble concentrating 1 1 -  Moving slowly or fidgety/restless 0 1 -  Suicidal thoughts 0 0 -  PHQ-9 Score 2 9 -  Difficult doing work/chores Not difficult at all Somewhat difficult -    GAD 7 : Generalized Anxiety Score 07/25/2019 05/10/2018  Nervous, Anxious, on Edge 1 0  Control/stop worrying 1 0  Worry too much - different things 2 0  Trouble relaxing 1 0  Restless 2 0  Easily annoyed or irritable 2 0  Afraid - awful might happen 0 0  Total GAD 7 Score 9 0  Anxiety Difficulty Somewhat difficult -      All questions at time of visit were answered - patient instructed to contact office with any additional concerns or updates.  ER/RTC precautions were reviewed with the patient.  Please note: voice recognition software was used to produce this document, and typos may escape review. Please contact Dr. Sheppard Coil for any needed clarifications.

## 2020-03-11 NOTE — Progress Notes (Signed)
    Procedures performed today:    Static splint applied to the left fifth DIP.  Independent interpretation of notes and tests performed by another provider:   None.  Brief History, Exam, Impression, and Recommendations:    Mucoid cyst of left fifth DIP This pleasant 69 year old female has recurrence of pain and swelling at her left fifth DIP, no trauma. Years ago I did an injection and aspiration of a mucinous cyst. Today she has more diffuse fifth DIP swelling. Significant tenderness, as well as difficulty to maintain full extension. I applied a staxx splint to wear for 2 weeks, we are going to get some x-rays, added meloxicam. Return to see me in 1 month, injection if no better.    ___________________________________________ Gwen Her. Dianah Field, M.D., ABFM., CAQSM. Primary Care and [place] Instructor of [place] of [facility] of Medicine

## 2020-03-11 NOTE — Telephone Encounter (Signed)
Msg to Ang- can you please auth?

## 2020-03-11 NOTE — Assessment & Plan Note (Addendum)
This pleasant 69 year old female has recurrence of pain and swelling at her left fifth DIP, no trauma. Years ago I did an injection and aspiration of a mucinous cyst. Today she has more diffuse fifth DIP swelling. Significant tenderness, as well as difficulty to maintain full extension. I applied a staxx splint to wear for 2 weeks, we are going to get some x-rays, added meloxicam. Return to see me in 1 month, injection if no better.

## 2020-03-11 NOTE — Telephone Encounter (Signed)
Severe GERD due to Fsamax, can we get Prolia ordered? Thanks.

## 2020-03-12 DIAGNOSIS — M1990 Unspecified osteoarthritis, unspecified site: Secondary | ICD-10-CM

## 2020-03-12 DIAGNOSIS — M1380 Other specified arthritis, unspecified site: Secondary | ICD-10-CM

## 2020-03-12 NOTE — Telephone Encounter (Signed)
Routing to provider  

## 2020-03-13 DIAGNOSIS — M1990 Unspecified osteoarthritis, unspecified site: Secondary | ICD-10-CM | POA: Diagnosis not present

## 2020-03-13 DIAGNOSIS — M1380 Other specified arthritis, unspecified site: Secondary | ICD-10-CM

## 2020-03-13 HISTORY — DX: Other specified arthritis, unspecified site: M13.80

## 2020-03-13 NOTE — Telephone Encounter (Signed)
PT called again 10:46am 03/13/20.  Asked same question.

## 2020-03-13 NOTE — Assessment & Plan Note (Signed)
Marginal erosions noted on x-ray, adding rheumatoid labs.

## 2020-03-19 NOTE — Telephone Encounter (Signed)
Submitted PA for prolia. Awaiting a response. PA # [id number]

## 2020-03-20 LAB — LUPUS(12) PANEL
Anti Nuclear Antibody (ANA): NEGATIVE
C3 Complement: 104 mg/dL (ref 83–193)
C4 Complement: 25 mg/dL (ref 15–57)
ENA SM Ab Ser-aCnc: <1 AI
Rheumatoid fact SerPl-aCnc: <14 IU/mL (ref <14)
Ribosomal P Protein Ab: <1 AI
SM/RNP: <1 AI
SSA (Ro) (ENA) Antibody, IgG: <1 AI
SSB (La) (ENA) Antibody, IgG: <1 AI
Scleroderma (Scl-70) (ENA) Antibody, IgG: <1 AI
Thyroperoxidase Ab SerPl-aCnc: 2 IU/mL (ref <9)
ds DNA Ab: <1 IU/mL

## 2020-03-20 LAB — RHEUMATOID FACTOR (IGA, IGG, IGM)
Rheumatoid Factor (IgA): <5 U (ref <=6)
Rheumatoid Factor (IgG): <5 U (ref <=6)
Rheumatoid Factor (IgM): <5 U (ref <=6)

## 2020-03-20 LAB — URIC ACID: Uric Acid, Serum: 5 mg/dL (ref 2.5–7.0)

## 2020-03-20 LAB — CYCLIC CITRUL PEPTIDE ANTIBODY, IGG: Cyclic Citrullin Peptide Ab: <16 UNITS

## 2020-03-20 LAB — SEDIMENTATION RATE: Sed Rate: 6 mm/h (ref 0–30)

## 2020-03-30 NOTE — Addendum Note (Signed)
Addended by: Narda Rutherford on: 03/30/2020 10:01 AM   Modules accepted: Orders

## 2020-03-30 NOTE — Telephone Encounter (Addendum)
PA for Prolia has been Approved. She will need a BMP prior to Prolia injection. Beatriz advised. She will call back to schedule nurse visit after she has the labs drawn.   Approval # [id number]

## 2020-03-31 ENCOUNTER — Other Ambulatory Visit: Payer: Self-pay

## 2020-03-31 DIAGNOSIS — M818 Other osteoporosis without current pathological fracture: Secondary | ICD-10-CM | POA: Diagnosis not present

## 2020-03-31 DIAGNOSIS — J069 Acute upper respiratory infection, unspecified: Secondary | ICD-10-CM

## 2020-03-31 LAB — BASIC METABOLIC PANEL WITH GFR
BUN: 15 mg/dL (ref 7–25)
CO2: 30 mmol/L (ref 20–32)
Calcium: 9.7 mg/dL (ref 8.6–10.4)
Chloride: 101 mmol/L (ref 98–110)
Creat: 0.82 mg/dL (ref 0.50–0.99)
GFR, Est African American: 85 mL/min/{1.73_m2} (ref >=60)
GFR, Est Non African American: 74 mL/min/{1.73_m2} (ref >=60)
Glucose, Bld: 85 mg/dL (ref 65–139)
Potassium: 4.1 mmol/L (ref 3.5–5.3)
Sodium: 139 mmol/L (ref 135–146)

## 2020-03-31 MED ORDER — IPRATROPIUM BROMIDE 0.06 % NA SOLN: 2.0000 | Freq: Four times a day (QID) | NASAL | 4 refills | Status: DC

## 2020-03-31 MED ORDER — CYCLOBENZAPRINE HCL 10 MG PO TABS: ORAL_TABLET | ORAL | 0 refills | Status: DC

## 2020-04-02 ENCOUNTER — Telehealth: Payer: Self-pay

## 2020-04-02 NOTE — Telephone Encounter (Signed)
Fo [place]

## 2020-04-02 NOTE — Telephone Encounter (Signed)
-----   Message from Annamaria Helling, [place] sent at 04/01/2020  9:17 AM EDT ----- Patient reviewed results and recommendations on mychart.   Please schedule prolia.

## 2020-04-02 NOTE — Telephone Encounter (Signed)
Per phone note 03/11/20 "PA for Prolia has been Approved. She will need a BMP prior to Prolia injection. Nykeria advised. She will call back to schedule nurse visit after she has the labs drawn.   Approval # [id number]"  Okay to have this done at appt with Dr T on 04/08/20?

## 2020-04-08 ENCOUNTER — Other Ambulatory Visit: Payer: Self-pay

## 2020-04-08 ENCOUNTER — Ambulatory Visit (INDEPENDENT_AMBULATORY_CARE_PROVIDER_SITE_OTHER): Payer: Medicare HMO | Admitting: Sports Medicine

## 2020-04-08 DIAGNOSIS — M19042 Primary osteoarthritis, left hand: Secondary | ICD-10-CM

## 2020-04-08 DIAGNOSIS — M818 Other osteoporosis without current pathological fracture: Secondary | ICD-10-CM

## 2020-04-08 MED ORDER — DENOSUMAB 60 MG/ML ~~LOC~~ SOSY
60.0000 mg | PREFILLED_SYRINGE | Freq: Once | SUBCUTANEOUS | Status: AC
  Administered 2020-04-08: 60 mg via SUBCUTANEOUS

## 2020-04-08 NOTE — Progress Notes (Signed)
    Procedures performed today:    Procedure: Real-time Ultrasound Guided injection of the left fifth DIP Device: Samsung HS60  Verbal informed consent obtained.  Time-out conducted.  Noted no overlying erythema, induration, or other signs of local infection.  Skin prepped in a sterile fashion.  Local anesthesia: Topical Ethyl chloride.  With sterile technique and under real time ultrasound guidance:  1/2 cc lidocaine, 1/2 cc Kenalog 40 injected easily  completed without difficulty  Pain immediately resolved suggesting accurate placement of the medication.  Advised to call if fevers/chills, erythema, induration, drainage, or persistent bleeding.  Images permanently stored and available for review in the ultrasound unit.  Impression: Technically successful ultrasound guided injection.  Independent interpretation of notes and tests performed by another provider:   None.  Brief History, Exam, Impression, and Recommendations:    Osteoarthritis of left little finger Krista [name] returns, she is a pleasant 69 year old female with osteoarthritis of [name] left fifth DIP. X-rays showed erosive changes so we obtained a rheumatoid work-up which was negative. Because of persistent pain in spite of immobilization and NSAIDs we are proceeding with a left fifth DIP injection, return in 1 month.    ___________________________________________ Krista [name]. Krista [name], M.D., ABFM., CAQSM. Primary Care and [place] Instructor of [place] of [facility] of Medicine

## 2020-04-08 NOTE — Assessment & Plan Note (Signed)
Krista [name] returns, she is a pleasant 69 year old female with osteoarthritis of her left fifth DIP. X-rays showed erosive changes so we obtained a rheumatoid work-up which was negative. Because of persistent pain in spite of immobilization and NSAIDs we are proceeding with a left fifth DIP injection, return in 1 month.

## 2020-05-06 ENCOUNTER — Encounter: Payer: Self-pay | Admitting: Sports Medicine

## 2020-05-06 ENCOUNTER — Ambulatory Visit (INDEPENDENT_AMBULATORY_CARE_PROVIDER_SITE_OTHER): Payer: Medicare HMO | Admitting: Sports Medicine

## 2020-05-06 ENCOUNTER — Ambulatory Visit (INDEPENDENT_AMBULATORY_CARE_PROVIDER_SITE_OTHER): Payer: Medicare HMO

## 2020-05-06 DIAGNOSIS — G4762 Sleep related leg cramps: Secondary | ICD-10-CM

## 2020-05-06 DIAGNOSIS — M25531 Pain in right wrist: Secondary | ICD-10-CM

## 2020-05-06 DIAGNOSIS — M47816 Spondylosis without myelopathy or radiculopathy, lumbar region: Secondary | ICD-10-CM

## 2020-05-06 DIAGNOSIS — M25532 Pain in left wrist: Secondary | ICD-10-CM

## 2020-05-06 DIAGNOSIS — M19042 Primary osteoarthritis, left hand: Secondary | ICD-10-CM | POA: Diagnosis not present

## 2020-05-06 DIAGNOSIS — M542 Cervicalgia: Secondary | ICD-10-CM | POA: Diagnosis not present

## 2020-05-06 HISTORY — DX: Pain in right wrist: M25.531

## 2020-05-06 HISTORY — DX: Spondylosis without myelopathy or radiculopathy, lumbar region: M47.816

## 2020-05-06 MED ORDER — MAGNESIUM OXIDE 400 MG PO TABS: 800.0000 mg | ORAL_TABLET | Freq: Every day | ORAL | 3 refills | Status: DC

## 2020-05-06 NOTE — Progress Notes (Signed)
    Procedures performed today:    None.  Independent interpretation of notes and tests performed by another provider:   None.  Brief History, Exam, Impression, and Recommendations:    Osteoarthritis of left little finger Krista [name] returns, at the last visit I did a left fifth DIP injection, rheumatoid work-up was negative. She is doing very well today.  Nocturnal leg cramps Krista [name] also has noted some cramping in her left leg, mostly at night, with the foot pulled into inversion on the left side. She does have history of lumbar disc disease, we are going to start with magnesium oxide at night, if insufficient improvement over the next month we will proceed with further imaging of her lumbar spine, as well as the addition of gabapentin +/- epidural.  Bilateral wrist pain Krista [name] also has pain over both wrists, she is somewhat tender over her extensor carpi radialis longus and brevis tendons, but really has no swelling. No paresthesias. There is no history to suggest an injury or tendinopathy, I am going to give her some neck and wrist rehabilitation exercises, I would like to x-ray her cervical spine and her wrist today. I do however think this is coming from her cervical spine so we will probably be adding gabapentin at a follow-up visit.    ___________________________________________ Gwen Her. Dianah Field, M.D., ABFM., CAQSM. Primary Care and [place] Instructor of [place] of [facility] of Medicine

## 2020-05-06 NOTE — Assessment & Plan Note (Signed)
Krista [name] also has noted some cramping in her left leg, mostly at night, with the foot pulled into inversion on the left side. She does have history of lumbar disc disease, we are going to start with magnesium oxide at night, if insufficient improvement over the next month we will proceed with further imaging of her lumbar spine, as well as the addition of gabapentin +/- epidural.

## 2020-05-06 NOTE — Assessment & Plan Note (Signed)
Krista [name] also has pain over both wrists, she is somewhat tender over her extensor carpi radialis longus and brevis tendons, but really has no swelling. No paresthesias. There is no history to suggest an injury or tendinopathy, I am going to give her some neck and wrist rehabilitation exercises, I would like to x-ray her cervical spine and her wrist today. I do however think this is coming from her cervical spine so we will probably be adding gabapentin at a follow-up visit.

## 2020-05-06 NOTE — Assessment & Plan Note (Signed)
Krista [name] returns, at the last visit I did a left fifth DIP injection, rheumatoid work-up was negative. She is doing very well today.

## 2020-05-08 ENCOUNTER — Other Ambulatory Visit: Payer: Self-pay

## 2020-05-08 MED ORDER — CYCLOBENZAPRINE HCL 10 MG PO TABS: ORAL_TABLET | ORAL | 1 refills | Status: DC

## 2020-05-19 ENCOUNTER — Encounter: Payer: Self-pay | Admitting: Osteopathic Medicine

## 2020-05-19 DIAGNOSIS — Z1211 Encounter for screening for malignant neoplasm of colon: Secondary | ICD-10-CM

## 2020-05-19 DIAGNOSIS — K2100 Gastro-esophageal reflux disease with esophagitis, without bleeding: Secondary | ICD-10-CM

## 2020-05-27 NOTE — Addendum Note (Signed)
Addended by: Towana Badger on: 05/27/2020 08:43 AM   Modules accepted: Orders

## 2020-05-29 ENCOUNTER — Ambulatory Visit (INDEPENDENT_AMBULATORY_CARE_PROVIDER_SITE_OTHER): Payer: Medicare HMO | Admitting: Nurse Practitioner

## 2020-05-29 ENCOUNTER — Encounter: Payer: Self-pay | Admitting: Nurse Practitioner

## 2020-05-29 VITALS — BP 166/90 | HR 80 | Temp 98.2°F | Ht 65.0 in | Wt 134.6 lb

## 2020-05-29 DIAGNOSIS — B009 Herpesviral infection, unspecified: Secondary | ICD-10-CM

## 2020-05-29 MED ORDER — ACYCLOVIR 400 MG PO TABS: 400.0000 mg | ORAL_TABLET | Freq: Three times a day (TID) | ORAL | 0 refills | Status: DC

## 2020-05-29 MED ORDER — LIDOCAINE VISCOUS HCL 2 % MT SOLN: 15.0000 mL | OROMUCOSAL | 1 refills | Status: DC | PRN

## 2020-05-29 NOTE — Patient Instructions (Signed)
Cold Sore  A cold sore, also called a fever blister, is a small, fluid-filled sore that forms inside the mouth or on the lips, gums, nose, chin, or cheeks. Cold sores can spread to other parts of the body, such as the eyes or fingers. In some people who have other medical conditions, cold sores can spread to multiple other body sites, including the genitals. Cold sores can spread from person to person (are contagious) until the sores crust over completely. Most cold sores go away within 2 weeks. What are the causes? Cold sores are caused by an infection from a common type of herpes simplex virus (HSV-1). HSV-1 is closely related to the HSV-2virus, which is the virus that causes genital herpes, but these viruses are not the same. Once a person is infected with HSV-1, the virus remains permanently in the body. HSV-1 is spread from person to person through close contact, such as through kissing, touching the affected area, or sharing personal items such as lip balm, razors, a drinking glass, or eating utensils. What increases the risk? You are more likely to develop this condition if you:  Are tired, stressed, or sick.  Are menstruating.  Are pregnant.  Take certain medicines.  Are exposed to cold weather or too much sun. What are the signs or symptoms? Symptoms of a cold sore outbreak go through different stages. These are the stages of a cold sore:  Tingling, itching, or burning is felt 1-2 days before the outbreak.  Fluid-filled blisters appear on the lips, inside the mouth, on the nose, or on the cheeks.  The blisters start to ooze clear fluid.  The blisters dry up, and a yellow crust appears in their place.  The crust falls off. In some cases, other symptoms can develop during a cold sore outbreak. These can include:  Fever.  Sore throat.  Headache.  Muscle aches.  Swollen neck glands. How is this diagnosed? This condition is diagnosed based on your medical history and a  physical exam. Your health care provider may do a blood test or may swab some fluid from your sore and then examine the swab in the lab. How is this treated? There is no cure for cold sores or HSV-1. There is also no vaccine for HSV-1. Most cold sores go away on their own without treatment within 2 weeks. Medicines cannot make the infection go away, but your health care provider may prescribe medicines to:  Help relieve some of the pain associated with the sores.  Work to stop the virus from multiplying.  Shorten healing time. Medicines may be in the form of creams, gels, pills, or a shot. Follow these instructions at home: Medicines  Take or apply over-the-counter and prescription medicines only as told by your health care provider.  Use a cotton-tip swab to apply creams or gels to your sores.  Ask your health care provider if you can take lysine supplements. Research has found that lysine may help heal the cold sore faster and prevent outbreaks. Sore care   Do not touch the sores or pick the scabs.  Wash your hands often. Do not touch your eyes without washing your hands first.  Keep the sores clean and dry.  If directed, apply ice to the sores: ? Put ice in a plastic bag. ? Place a towel between your skin and the bag. ? Leave the ice on for 20 minutes, 2-3 times a day. Eating and drinking  Eat a soft, bland diet. Avoid eating   hot, cold, or salty foods.  Use a straw if it hurts to drink out of a glass.  Eat foods that are rich in lysine, such as meat, fish, and dairy products.  Avoid sugary foods, chocolates, nuts, and grains. These foods are rich in a nutrient called arginine, which can cause the virus to multiply. Lifestyle  Do not kiss, have oral sex, or share personal items until your sores heal.  Stress, poor sleep, and being out in the sun can trigger outbreaks. Make sure you: ? Do activities that help you relax, such as deep breathing exercises or  meditation. ? Get enough sleep. ? Apply sunscreen on your lips before you go out in the sun. Contact a health care provider if:  You have symptoms for more than 2 weeks.  You have pus coming from the sores.  You have redness that is spreading.  You have pain or irritation in your eye.  You get sores on your genitals.  Your sores do not heal within 2 weeks.  You have frequent cold sore outbreaks. Get help right away if you have:  A fever and your symptoms suddenly get worse.  A headache and confusion.  Fatigue or loss of appetite.  A stiff neck or sensitivity to light. Summary  A cold sore, also called a fever blister, is a small, fluid-filled sore that forms inside the mouth or on the lips, gums, nose, chin, or cheeks.  Most cold sores go away on their own without treatment within 2 weeks. Your health care provider may prescribe medicines to help relieve some of the pain, work to stop the virus from multiplying, and shorten healing time.  Wash your hands often. Do not touch your eyes without washing your hands first.  Do not kiss, have oral sex, or share personal items until your sores heal.  Contact a health care provider if your sores do not heal within 2 weeks. This information is not intended to replace advice given to you by your health care provider. Make sure you discuss any questions you have with your health care provider. Document Revised: 03/13/2019 Document Reviewed: 04/23/2018 Elsevier Patient Education  2020 [employer].   

## 2020-05-29 NOTE — Progress Notes (Signed)
Acute Office Visit  Subjective:    Patient ID: Krista [name], female    DOB: 06-Feb-1951, 69 y.o.   MRN: [id number]  Chief Complaint  Patient presents with  . Mouth Lesions    onset:3wks, multiple mouth ulcers inside of mouth, very sore, not able to eat well, has been using warm salt water flushes with minimal relief    Mouth Lesions  Associated symptoms include mouth sores.   Patient is in today for oral ulcerations that have been present for the past 3 weeks. She reports onset of a single, painful oral lesion around her lips initially. She reports that multiple ulcerations have since formed and will heal, but new ones form fairly quickly. She has tried OTC abreeva, salt water gargles, and peridontal rinse with no success in treatment of the lesions. She reports pain with brushing her teeth and using a water pik and difficulty eating and drinking due to the tenderness she is experiencing. She reports that she has had cold sores in the past. She also reports a recent issue with severe reflux and abdominal bloating and pain and she wonders if this could be related. She has an appointment with gastroenterology on Tuesday of next week, but would like to see if anything can provide relief today.   She denies fever, chills, rashes, sore throat, cough, URI symptoms, recent antibiotic use, poor dentition, or vomiting.   Past Medical History:  Diagnosis Date  . Arthritis   . Bell palsy   . Brain aneurysm   . Hyperlipidemia   . Hypertension   . Osteoporosis     Past Surgical History:  Procedure Laterality Date  . ABDOMINAL HYSTERECTOMY    . CEREBRAL ANEURYSM REPAIR    . EYE SURGERY     cararact  . FOOT SURGERY    . HAND SURGERY    . MASTOIDECTOMY Left     Family History  Problem Relation Age of Onset  . Heart failure Mother   . Cancer Father   . Cancer Brother     Social History   Socioeconomic History  . Marital status: Married    Spouse name: Randal  . Number of children: 2   . Years of education: 53  . Highest education level: 12th grade  Occupational History  . Occupation: retired    Comment: Retail buyer  Tobacco Use  . Smoking status: Former Smoker    Quit date: 12/05/2008    Years since quitting: 11.4  . Smokeless tobacco: Never Used  Vaping Use  . Vaping Use: Never used  Substance and Sexual Activity  . Alcohol use: No  . Drug use: No  . Sexual activity: Not Currently  Other Topics Concern  . Not on file  Social History Narrative   Walks 3 times a week   Social Determinants of Health   Financial Resource Strain:   . Difficulty of Paying Living Expenses:   Food Insecurity:   . Worried About Charity fundraiser in the Last Year:   . Arboriculturist in the Last Year:   Transportation Needs:   . Film/video editor (Medical):   Marland Kitchen Lack of Transportation (Non-Medical):   Physical Activity: Unknown  . Days of Exercise per Week: Not on file  . Minutes of Exercise per Session: 30 min  Stress:   . Feeling of Stress :   Social Connections: Unknown  . Frequency of Communication with Friends and Family: Not on file  . Frequency of Social Gatherings  with Friends and Family: Once a week  . Attends Religious Services: Not on file  . Active Member of Clubs or Organizations: Not on file  . Attends Archivist Meetings: Not on file  . Marital Status: Not on file  Intimate Partner Violence:   . Fear of Current or Ex-Partner:   . Emotionally Abused:   Marland Kitchen Physically Abused:   . Sexually Abused:     Outpatient Medications Prior to Visit  Medication Sig Dispense Refill  . CALCIUM + VITAMIN D3 600-10 MG-MCG TABS TAKE 2 TABLETS BY MOUTH EVERY DAY 300 tablet 0  . cyclobenzaprine (FLEXERIL) 10 MG tablet TAKE 1/2 TO 1 TABLET BY MOUTH 3 TIMES A DAY AS NEEDED MUSCLE SPASMS 90 tablet 1  . diphenhydramine-acetaminophen (TYLENOL PM) 25-500 MG TABS tablet Take 2 tablets by mouth at bedtime as needed.     . famotidine (PEPCID) 40 MG tablet Take  1 tablet (40 mg total) by mouth at bedtime. 90 tablet 0  . hydrochlorothiazide (HYDRODIURIL) 12.5 MG tablet Take 1 tablet (12.5 mg total) by mouth daily. 90 tablet 1  . ipratropium (ATROVENT) 0.06 % nasal spray Place 2 sprays into both nostrils 4 (four) times daily. 15 mL 4  . loratadine (CLARITIN) 10 MG tablet Take 10 mg by mouth daily.    . magnesium oxide (MAG-OX) 400 MG tablet Take 2 tablets (800 mg total) by mouth at bedtime. 180 tablet 3  . meloxicam (MOBIC) 15 MG tablet One tab PO qAM with a meal for 2 weeks, then daily prn pain. 30 tablet 3  . pantoprazole (PROTONIX) 40 MG tablet Take 1 tablet (40 mg total) by mouth 2 (two) times daily. 180 tablet 0  . pravastatin (PRAVACHOL) 40 MG tablet Take 1 tablet (40 mg total) by mouth daily. 90 tablet 1  . Calcium-Magnesium-Vitamin D 600-40-500 MG-MG-UNIT TB24 Take 2 tablets by mouth daily. 180 tablet 3  . chlorhexidine (PERIDEX) 0.12 % solution Use as directed 15 mLs in the mouth or throat 2 (two) times daily. 120 mL 0   No facility-administered medications prior to visit.    Allergies  Allergen Reactions  . Codeine Other (See Comments)    Hyperactivity "wired" super hyperactivity     . Kiwi Extract Hives  . Other Hives, Rash and Other (See Comments)    Nectarines--tongue swells   . Plum Pulp Hives  . Tape Hives and Rash  . Tomato Hives  . Prednisone Other (See Comments)    Irritability         Objective:    Physical Exam Constitutional:      Appearance: Normal appearance. She is normal weight.  HENT:     Head: Normocephalic.     Nose: Nose normal. No congestion or rhinorrhea.     Mouth/Throat:     Mouth: Mucous membranes are moist. Oral lesions present.     Dentition: Gum lesions present.     Tongue: Lesions present.     Pharynx: Oropharynx is clear. No oropharyngeal exudate.     Comments: Erythematous crusted lesions to the right side of the lower lip convalescing. Scattered ulcerated lesions with white coating along  the lower gum line and buccal areas. Single ulcerated lesion on the left medial portion of the tongue.  Eyes:     Extraocular Movements: Extraocular movements intact.     Conjunctiva/sclera: Conjunctivae normal.     Pupils: Pupils are equal, round, and reactive to light.  Cardiovascular:     Rate and Rhythm: Normal  rate.     Pulses: Normal pulses.  Pulmonary:     Effort: Pulmonary effort is normal.  Abdominal:     General: Abdomen is flat. There is distension.  Musculoskeletal:        General: Normal range of motion.     Cervical back: Normal range of motion.  Skin:    General: Skin is warm and dry.     Capillary Refill: Capillary refill takes less than 2 seconds.     Coloration: Skin is not jaundiced.     Findings: No erythema, lesion or rash.  Neurological:     General: No focal deficit present.     Mental Status: She is alert and oriented to person, place, and time.  Psychiatric:        Mood and Affect: Mood normal.        Behavior: Behavior normal.        Thought Content: Thought content normal.        Judgment: Judgment normal.     BP (!) 166/90   Pulse 80   Temp 98.2 F (36.8 C) (Oral)   Ht 5\' 5"  (1.651 m)   Wt 134 lb 9.6 oz (61.1 kg)   SpO2 98%   BMI 22.40 kg/m  Wt Readings from Last 3 Encounters:  05/29/20 134 lb 9.6 oz (61.1 kg)  03/11/20 134 lb 1.3 oz (60.8 kg)  11/25/19 130 lb (59 kg)    There are no preventive care reminders to display for this patient.  There are no preventive care reminders to display for this patient.   Lab Results  Component Value Date   TSH 0.71 08/17/2017   Lab Results  Component Value Date   WBC 5.7 08/02/2019   HGB 12.8 08/02/2019   HCT 38.7 08/02/2019   MCV 95.1 08/02/2019   PLT 248 08/02/2019   Lab Results  Component Value Date   NA 139 03/31/2020   K 4.1 03/31/2020   CO2 30 03/31/2020   GLUCOSE 85 03/31/2020   BUN 15 03/31/2020   CREATININE 0.82 03/31/2020   BILITOT 0.5 08/02/2019   AST 20 08/02/2019    ALT 14 08/02/2019   PROT 7.0 08/02/2019   CALCIUM 9.7 03/31/2020   Lab Results  Component Value Date   CHOL 249 (H) 08/02/2019   Lab Results  Component Value Date   HDL 68 08/02/2019   Lab Results  Component Value Date   LDLCALC 150 (H) 08/02/2019   Lab Results  Component Value Date   TRIG 168 (H) 08/02/2019   Lab Results  Component Value Date   CHOLHDL 3.7 08/02/2019   No results found for: HGBA1C     Assessment & Plan:   1. HSV-1 (herpes simplex virus 1) infection Symptoms and presentation consisted with HSV1 oral lesions. Differential diagnoses include ulceration from increased gastric reflux, however, given the history of HSV lesions and the scattered occurrence, we will treat to day with oral acyclovir three times a day for the next 7 days. We will also utilize viscous lidocaine that can be swished and spit or applied directly to the lesions with a cotton swab for pain. Instructions provided on the proper use of the medication. We did discuss that if the lesions fail to improve with the medication, we may need to consider a biopsy of the area.   PLAN: - oral acyclovir x 7 days - viscous lidocaine oral rinse (spit- do not swallow) for pain. - follow-up with GI next week  -  If symptoms persist, we may need to consider biopsy of the area  - lidocaine (XYLOCAINE) 2 % solution; Use as directed 15 mLs in the mouth or throat every 3 (three) hours as needed (mouth/throat pain - gargle and spit).  Dispense: 100 mL; Refill: 1 - acyclovir (ZOVIRAX) 400 MG tablet; Take 1 tablet (400 mg total) by mouth 3 (three) times daily.  Dispense: 21 tablet; Refill: 0  Return if symptoms worsen or fail to improve.  Orma Render, NP

## 2020-06-01 ENCOUNTER — Other Ambulatory Visit: Payer: Self-pay | Admitting: Osteopathic Medicine

## 2020-06-02 ENCOUNTER — Other Ambulatory Visit: Payer: Self-pay | Admitting: Osteopathic Medicine

## 2020-06-02 DIAGNOSIS — K219 Gastro-esophageal reflux disease without esophagitis: Secondary | ICD-10-CM | POA: Diagnosis not present

## 2020-06-02 DIAGNOSIS — R112 Nausea with vomiting, unspecified: Secondary | ICD-10-CM | POA: Diagnosis not present

## 2020-06-03 ENCOUNTER — Ambulatory Visit (INDEPENDENT_AMBULATORY_CARE_PROVIDER_SITE_OTHER): Payer: Medicare HMO

## 2020-06-03 ENCOUNTER — Other Ambulatory Visit: Payer: Self-pay

## 2020-06-03 ENCOUNTER — Ambulatory Visit (INDEPENDENT_AMBULATORY_CARE_PROVIDER_SITE_OTHER): Payer: Medicare HMO | Admitting: Sports Medicine

## 2020-06-03 DIAGNOSIS — M19042 Primary osteoarthritis, left hand: Secondary | ICD-10-CM | POA: Diagnosis not present

## 2020-06-03 DIAGNOSIS — M5416 Radiculopathy, lumbar region: Secondary | ICD-10-CM | POA: Diagnosis not present

## 2020-06-03 DIAGNOSIS — M545 Low back pain: Secondary | ICD-10-CM | POA: Diagnosis not present

## 2020-06-03 NOTE — Progress Notes (Signed)
    Procedures performed today:    None.  Independent interpretation of notes and tests performed by another provider:   Lumbar spine x-rays from 2019 show L3-L4 DDD.  Brief History, Exam, Impression, and Recommendations:    Osteoarthritis of left little finger Krista [name] returns, at the last visit she was doing extremely well after a left fifth DIP injection. Today she is having recurrence of pain and a mallet type appearance of the finger. I like her to go back into the stack splint for at least 4 weeks.   Left lumbar radiculitis Krista [name] also has had some nocturnal leg cramps, left worse than right, her left ankle will be drawn into inversion. She also has some pain in her back and hip. Magnesium was ineffective. On review of her x-rays from 2019 she does have L3-L4 DDD. Proceeding with updated x-rays, as well as MRI of the lumbar spine for interventional planning.    ___________________________________________ Gwen Her. Dianah Field, M.D., ABFM., CAQSM. Primary Care and [place] Instructor of [place] of [facility]

## 2020-06-03 NOTE — Assessment & Plan Note (Signed)
Krista [name] returns, at the last visit she was doing extremely well after a left fifth DIP injection. Today she is having recurrence of pain and a mallet type appearance of the finger. I like her to go back into the stack splint for at least 4 weeks.

## 2020-06-03 NOTE — Assessment & Plan Note (Addendum)
Krista [name] also has had some nocturnal leg cramps, left worse than right, her left ankle will be drawn into inversion. She also has some pain in her back and hip. Magnesium was ineffective. On review of her x-rays from 2019 she does have L3-L4 DDD. Proceeding with updated x-rays, as well as MRI of the lumbar spine for interventional planning.

## 2020-06-15 DIAGNOSIS — K219 Gastro-esophageal reflux disease without esophagitis: Secondary | ICD-10-CM | POA: Diagnosis not present

## 2020-06-15 DIAGNOSIS — R112 Nausea with vomiting, unspecified: Secondary | ICD-10-CM | POA: Diagnosis not present

## 2020-06-15 DIAGNOSIS — K449 Diaphragmatic hernia without obstruction or gangrene: Secondary | ICD-10-CM | POA: Diagnosis not present

## 2020-06-21 ENCOUNTER — Other Ambulatory Visit: Payer: Medicare HMO

## 2020-06-28 ENCOUNTER — Ambulatory Visit (INDEPENDENT_AMBULATORY_CARE_PROVIDER_SITE_OTHER): Payer: Medicare HMO

## 2020-06-28 ENCOUNTER — Other Ambulatory Visit: Payer: Self-pay

## 2020-06-28 DIAGNOSIS — M48061 Spinal stenosis, lumbar region without neurogenic claudication: Secondary | ICD-10-CM | POA: Diagnosis not present

## 2020-06-28 DIAGNOSIS — M4316 Spondylolisthesis, lumbar region: Secondary | ICD-10-CM | POA: Diagnosis not present

## 2020-06-28 DIAGNOSIS — M5416 Radiculopathy, lumbar region: Secondary | ICD-10-CM

## 2020-06-28 DIAGNOSIS — M4726 Other spondylosis with radiculopathy, lumbar region: Secondary | ICD-10-CM | POA: Diagnosis not present

## 2020-06-28 DIAGNOSIS — M5116 Intervertebral disc disorders with radiculopathy, lumbar region: Secondary | ICD-10-CM | POA: Diagnosis not present

## 2020-06-29 ENCOUNTER — Other Ambulatory Visit: Payer: Self-pay | Admitting: Sports Medicine

## 2020-06-29 DIAGNOSIS — M6740 Ganglion, unspecified site: Secondary | ICD-10-CM

## 2020-07-03 ENCOUNTER — Ambulatory Visit: Payer: Medicare HMO | Admitting: Sports Medicine

## 2020-07-06 ENCOUNTER — Ambulatory Visit (INDEPENDENT_AMBULATORY_CARE_PROVIDER_SITE_OTHER): Payer: Medicare HMO | Admitting: Sports Medicine

## 2020-07-06 ENCOUNTER — Encounter: Payer: Self-pay | Admitting: Sports Medicine

## 2020-07-06 ENCOUNTER — Other Ambulatory Visit: Payer: Self-pay

## 2020-07-06 DIAGNOSIS — M5030 Other cervical disc degeneration, unspecified cervical region: Secondary | ICD-10-CM | POA: Diagnosis not present

## 2020-07-06 DIAGNOSIS — B009 Herpesviral infection, unspecified: Secondary | ICD-10-CM

## 2020-07-06 DIAGNOSIS — M5416 Radiculopathy, lumbar region: Secondary | ICD-10-CM | POA: Diagnosis not present

## 2020-07-06 MED ORDER — ACYCLOVIR 400 MG PO TABS: 400.0000 mg | ORAL_TABLET | Freq: Three times a day (TID) | ORAL | 0 refills | Status: DC

## 2020-07-06 NOTE — Assessment & Plan Note (Signed)
As below adding physical therapy, we will proceed with an epidural and MRI if no better. She has had epidurals in the cervical spine in the distant past with fantastic results.

## 2020-07-06 NOTE — Progress Notes (Signed)
    Procedures performed today:    None.  Independent interpretation of notes and tests performed by another provider:   MRI personally reviewed, multilevel lumbar DDD.  Brief History, Exam, Impression, and Recommendations:    Left lumbar radiculitis This is a pleasant 69 year old female, nocturnal leg cramps, left worse than right, likely radicular. We obtained an MRI that shows multilevel DDD, she would like to do some physical therapy before considering an epidural, I think this is entirely appropriate.  DDD (degenerative disc disease), cervical As below adding physical therapy, we will proceed with an epidural and MRI if no better. She has had epidurals in the cervical spine in the distant past with fantastic results.    ___________________________________________ Gwen Her. Dianah Field, M.D., ABFM., CAQSM. Primary Care and [place] Instructor of [place] of [facility]

## 2020-07-06 NOTE — Assessment & Plan Note (Signed)
This is a pleasant 69 year old female, nocturnal leg cramps, left worse than right, likely radicular. We obtained an MRI that shows multilevel DDD, she would like to do some physical therapy before considering an epidural, I think this is entirely appropriate.

## 2020-07-09 ENCOUNTER — Other Ambulatory Visit: Payer: Self-pay

## 2020-07-09 ENCOUNTER — Ambulatory Visit (INDEPENDENT_AMBULATORY_CARE_PROVIDER_SITE_OTHER): Payer: Medicare HMO | Admitting: Rehabilitative and Restorative Service Providers"

## 2020-07-09 ENCOUNTER — Encounter: Payer: Self-pay | Admitting: Rehabilitative and Restorative Service Providers"

## 2020-07-09 DIAGNOSIS — M5442 Lumbago with sciatica, left side: Secondary | ICD-10-CM | POA: Diagnosis not present

## 2020-07-09 DIAGNOSIS — G8929 Other chronic pain: Secondary | ICD-10-CM

## 2020-07-09 DIAGNOSIS — R29898 Other symptoms and signs involving the musculoskeletal system: Secondary | ICD-10-CM | POA: Diagnosis not present

## 2020-07-09 DIAGNOSIS — M6281 Muscle weakness (generalized): Secondary | ICD-10-CM | POA: Diagnosis not present

## 2020-07-09 NOTE — Patient Instructions (Addendum)
  Access Code: [email or web address]: [email or web address]: 08/05/2021Prepared by: Cathie Bonnell HoltExercises  Prone Press Up on Elbows - 2 x daily - 7 x weekly - 1 sets - 3 reps - 30 sec hold  Prone Quadriceps Stretch with Strap - 2 x daily - 7 x weekly - 1 sets - 3 reps - 30 sec hold

## 2020-07-09 NOTE — Therapy (Signed)
[place], [place], [address] Phone: [phone]   Fax:  [phone]  Physical Therapy Evaluation  Patient Details  Name: Krista [name] MRN: [id number] Date of Birth: 25-Nov-1951 Referring Provider (PT): Dr Sophronia Simas    Encounter Date: 07/09/2020   PT End of Session - 07/09/20 1015    Visit Number 1    Number of Visits 12    Date for PT Re-Evaluation 08/20/20    PT Start Time 0932    PT Stop Time 1019    PT Time Calculation (min) 47 min    Activity Tolerance Patient tolerated treatment well           Past Medical History:  Diagnosis Date  . Arthritis   . Bell palsy   . Brain aneurysm   . Hyperlipidemia   . Hypertension   . Osteoporosis     Past Surgical History:  Procedure Laterality Date  . ABDOMINAL HYSTERECTOMY    . CEREBRAL ANEURYSM REPAIR    . EYE SURGERY     cararact  . FOOT SURGERY    . HAND SURGERY    . MASTOIDECTOMY Left     There were no vitals filed for this visit.    Subjective Assessment - 07/09/20 0939    Subjective Patient reports ~ 15 years of LBP with increased in symptoms in the past year. She decreased activity during the pandemic and noticed increased back pain. Pain is worse with transition sit >/< lying down. Also having pain in the neck in the past month    Pertinent History Chronic LBP; neck pain ; Bell's palsy; HTN; arthritis; osteoporesis; bilat feet and hand surgeries; Rt RCR; aneurysm surgery; ear surgery    Currently in Pain? Yes    Pain Score 7     Pain Location Back    Pain Orientation Left;Right   Lt > Rt   Pain Descriptors / Indicators Throbbing    Pain Type Acute pain;Chronic pain    Pain Radiating Towards down Lt hip lateral thigh and calf into the Lt foot pulling foot into a "charlie horse"    Pain Onset More than a month ago    Pain Frequency Constant    Aggravating Factors  sit to lying down; lying to sitting; walking > 1 hour; lifting;  bending    Pain Relieving Factors TENs;heat; ice; topical analgesic              [facility] PT Assessment - 07/09/20 0001      Assessment   Medical Diagnosis Lumbar radiculitis Lt > Rt     Referring Provider (PT) Dr Sophronia Simas     Onset Date/Surgical Date 07/05/20   worse in the past 1-2 months    Hand Dominance Right    Next MD Visit after 6 weeks of PT     Prior Therapy yes here for LBP       Precautions   Precautions None      Balance Screen   Has the patient fallen in the past 6 months No    Has the patient had a decrease in activity level because of a fear of falling?  No    Is the patient reluctant to leave their home because of a fear of falling?  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     Leisure [employer] 3 days/wk 3 hours - water classes; walking ~ 1 mile/day; household chores; some exercises for neck from Dr T       Observation/Other Assessments   Focus on Therapeutic Outcomes (FOTO)  64% limitation       Sensation   Additional Comments tingling in the posterior Lt hip on an intermittetn basis       Posture/Postural Control   Posture Comments head forward; shoudlers rounded and elevated; flexed forward at hips       AROM   Lumbar Flexion 60% painful lt    Lumbar Extension 30% painful Lt     Lumbar - Right Side Bend 55% painful Lt     Lumbar - Left Side Bend 50% painful Lt     Lumbar - Right Rotation 15% painful Lt     Lumbar - Left Rotation 10% painful Lt       Strength   Right/Left Hip --   pain with resistive testing Lt LE    Right Hip Flexion 5/5    Right Hip Extension 5/5    Right Hip ABduction 5/5    Left Hip Flexion 4+/5    Left Hip Extension 4+/5    Left Hip ABduction 5/5      Flexibility   Soft Tissue Assessment /Muscle Length  --   tight and painful  Lt >> Rt    Hamstrings tight lt > Rt     Quadriceps tight and painful Lt > Rt     ITB tight Lt > Rt     Piriformis tight Rt > Lt       Palpation   Spinal mobility hypomobile and painful with CPA mobs lumbar spine     Palpation comment significant muscular tightness through Lt >> RT psoas; abs; posterior hip through the piriformis/gluts       Special Tests   Other special tests (-) SLR                       Objective measurements completed on examination: See above findings.       [facility] Adult PT Treatment/Exercise - 07/09/20 0001      Lumbar Exercises: Stretches   Hip Flexor Stretch Right;Left;2 reps;30 seconds   seated    Prone on Elbows Stretch 3 reps;30 seconds    Quad Stretch Right;Left;2 reps;30 seconds   prone w/ strap                  PT Education - 07/09/20 1012    Education Details HEP POC    Person(s) Educated Patient    Methods Explanation;Demonstration;Tactile cues;Verbal cues;Handout    Comprehension Verbalized understanding;Returned demonstration;Verbal cues required;Tactile cues required               PT Long Term Goals - 07/09/20 1027      PT LONG TERM GOAL #1   Title improve tissue extensibility with patient to demonstrated increased mobility and ROM to allow more functional movement for transitional movements    Time 6    Period Weeks    Status New    Target Date 08/20/20      PT LONG TERM GOAL #2   Title Decrease pain by 25-50% allowing patient to perform functional activities with greater ease    Time 6    Period Days    Status New  Target Date 08/20/20      PT LONG TERM GOAL #3   Title Patient to demonstrate proper transitional movements for sit to lying down; lying to sitting    Time 6    Period Weeks    Status New    Target Date 08/20/20      PT LONG TERM GOAL #4   Title Independent in HEP    Time 6    Period Weeks    Status New    Target Date 08/20/20      PT LONG TERM GOAL #5    Title Improve FOTO to </= 51% limitation    Time 6    Period Weeks    Status New    Target Date 08/20/20      Additional Long Term Goals   Additional Long Term Goals Yes      PT LONG TERM GOAL #6   Title Improve functional movement and independent in aquatic exercise to transition to community water exercise program    Time 6    Period Weeks    Status New    Target Date 08/20/20                  Plan - 07/09/20 1020    Clinical Impression Statement Patient presents with flare up of chronic LBP with increased symptoms in the past 6-12 months. She has limited trunk and LE ROM/mobility; muscular tightness and tenderness to palpation; decreased balance; pain and limitation with transitional movements. Symptoms ar greater Lt than Rt and radicular to Lt LE/foot at times. Patient will benefit from PT to address problems identified.    Stability/Clinical Decision Making Stable/Uncomplicated    Clinical Decision Making Low    Rehab Potential Good    PT Frequency 2x / week    PT Duration 6 weeks    PT Treatment/Interventions Patient/family education;ADLs/Self Care Home Management;Aquatic Therapy;Cryotherapy;Electrical Stimulation;Iontophoresis 4mg /ml Dexamethasone;Moist Heat;Traction;Ultrasound;Functional mobility training;Therapeutic activities;Therapeutic exercise;Balance training;Neuromuscular re-education;Manual techniques;Dry needling;Taping    PT Next Visit Plan review HEP; progress with myofacial release and deep tissue work Lt > Rt psoas/posterior hip; ball release work; joint mobs through hips; core stabilization and strengthening; trial of DN as indicated; has TENS unit at home; modalities as indicated    PT [place]; VHI    Consulted and Agree with Plan of Care Patient           Patient will benefit from skilled therapeutic intervention in order to improve the following deficits and impairments:  Decreased range of motion, Pain, Decreased balance,  Impaired flexibility, Improper body mechanics, Decreased mobility, Decreased strength, Postural dysfunction  Visit Diagnosis: Chronic bilateral low back pain with left-sided sciatica - Plan: PT plan of care cert/re-cert  Other symptoms and signs involving the musculoskeletal system - Plan: PT plan of care cert/re-cert  Muscle weakness (generalized) - Plan: PT plan of care cert/re-cert     Problem List Patient Active Problem List   Diagnosis Date Noted  . DDD (degenerative disc disease), cervical 07/06/2020  . Left lumbar radiculitis 05/06/2020  . Bilateral wrist pain 05/06/2020  . Inflammatory arthritis 03/13/2020  . Osteoarthritis of left little finger 07/31/2018  . Acute bronchitis with COPD ([place]) 05/10/2018  . Impacted cerumen of left ear 03/07/2018  . History of left mastoidectomy 03/07/2018  . Tympanosclerosis, left ear 03/07/2018  . Cardiac risk counseling 09/05/2017  . Essential hypertension 09/05/2017  . Mixed hyperlipidemia 09/05/2017  . History of Bell's palsy 08/15/2017  . History of osteoporosis  08/15/2017  . History of colon polyps 08/15/2017  . Diarrhea 08/15/2017  . History of nonmelanoma skin cancer 08/15/2017  . Brain aneurysm 08/15/2017  . Bilateral hearing loss 08/15/2017  . H/O hysterectomy for benign disease 08/15/2017    Albert Hersch Nilda Simmer PT, MPH  07/09/2020, 10:36 AM  [facility] [place], [place], [address] Phone: [phone]   Fax:  [phone]  Name: Krista [name] MRN: [id number] Date of Birth: March 16, 1951

## 2020-07-15 ENCOUNTER — Other Ambulatory Visit: Payer: Self-pay

## 2020-07-15 ENCOUNTER — Ambulatory Visit (INDEPENDENT_AMBULATORY_CARE_PROVIDER_SITE_OTHER): Payer: Medicare HMO | Admitting: Rehabilitative and Restorative Service Providers"

## 2020-07-15 ENCOUNTER — Encounter: Payer: Self-pay | Admitting: Rehabilitative and Restorative Service Providers"

## 2020-07-15 DIAGNOSIS — R29898 Other symptoms and signs involving the musculoskeletal system: Secondary | ICD-10-CM | POA: Diagnosis not present

## 2020-07-15 DIAGNOSIS — G8929 Other chronic pain: Secondary | ICD-10-CM

## 2020-07-15 DIAGNOSIS — M6281 Muscle weakness (generalized): Secondary | ICD-10-CM

## 2020-07-15 DIAGNOSIS — M545 Low back pain, unspecified: Secondary | ICD-10-CM

## 2020-07-15 DIAGNOSIS — M5442 Lumbago with sciatica, left side: Secondary | ICD-10-CM | POA: Diagnosis not present

## 2020-07-15 NOTE — Patient Instructions (Signed)
Access Code: [email or web address]: [email or web address]: 08/11/2021Prepared by: Celyn HoltExercises  Prone Press Up on Elbows - 2 x daily - 7 x weekly - 1 sets - 3 reps - 30 sec hold  Prone Quadriceps Stretch with Strap - 2 x daily - 7 x weekly - 1 sets - 3 reps - 30 sec hold  Gastroc Stretch on Wall - 2 x daily - 7 x weekly - 1 sets - 3 reps - 30 sec hold  Doorway Pec Stretch at 60 Degrees Abduction - 3 x daily - 7 x weekly - 3 reps - 1 sets  Doorway Pec Stretch at 90 Degrees Abduction - 3 x daily - 7 x weekly - 3 reps - 1 sets - 30 seconds hold  Doorway Pec Stretch at 120 Degrees Abduction - 3 x daily - 7 x weekly - 3 reps - 1 sets - 30 second hold hold  Seated Hamstring Stretch - 2 x daily - 7 x weekly - 1 sets - 3 reps - 30 sec hold Patient Education  Trigger Point Dry Needling  Ergonomics for Shoulder Discomfort

## 2020-07-15 NOTE — Therapy (Signed)
[place], [place], [address] Phone: [phone]   Fax:  [phone]  Physical Therapy Treatment  Patient Details  Name: Krista [name] MRN: [id number] Date of Birth: June 06, 1951 Referring Provider (PT): Dr Sophronia Simas    Encounter Date: 07/15/2020   PT End of Session - 07/15/20 1346    Visit Number 2    Number of Visits 12    Date for PT Re-Evaluation 08/20/20    PT Start Time 6195    PT Stop Time 1434    PT Time Calculation (min) 49 min    Activity Tolerance Patient tolerated treatment well           Past Medical History:  Diagnosis Date  . Arthritis   . Bell palsy   . Brain aneurysm   . Hyperlipidemia   . Hypertension   . Osteoporosis     Past Surgical History:  Procedure Laterality Date  . ABDOMINAL HYSTERECTOMY    . CEREBRAL ANEURYSM REPAIR    . EYE SURGERY     cararact  . FOOT SURGERY    . HAND SURGERY    . MASTOIDECTOMY Left     There were no vitals filed for this visit.   Subjective Assessment - 07/15/20 1347    Subjective Patient reports that she did 2 hours of water exercise this am. She had "charlie horses" in both legs and one foot this morning. Patient has been working on her exercises at home. Notes painin the Lt hip and both sides of her neck today.    Currently in Pain? Yes    Pain Score 8     Pain Location Back    Pain Orientation Left    Pain Descriptors / Indicators Throbbing    Pain Type Acute pain;Chronic pain                             [facility] Adult PT Treatment/Exercise - 07/15/20 0001      Lumbar Exercises: Stretches   Passive Hamstring Stretch Right;Left;2 reps;30 seconds   seated - hinge at hips    Hip Flexor Stretch Right;Left;2 reps;30 seconds   seated    Prone on Elbows Stretch 3 reps;30 seconds    Quad Stretch Right;Left;2 reps;30 seconds   prone w/ strap    Gastroc Stretch Right;Left;2 reps;30 seconds      Shoulder  Exercises: Stretch   Other Shoulder Stretches doorway stretch 3 positions  30 sec hold x 2 reps       Moist Heat Therapy   Number Minutes Moist Heat 10 Minutes    Moist Heat Location Lumbar Spine;Cervical      Manual Therapy   Manual therapy comments skilled palpation for assessment of response to DN/manual work     Soft tissue mobilization Lt posterior hip/LB; bilat upper trap and cervical musculature     Myofascial Release Lt posterior hip             Trigger Point Dry Needling - 07/15/20 0001    Consent Given? Yes    Education Handout Provided Yes    Dry Needling Comments Lt posterior hip; bilat upper trap     Upper Trapezius Response Palpable increased muscle length    Gluteus Maximus Response Palpable increased muscle length    Piriformis Response Palpable increased muscle length                PT Education -  07/15/20 1410    Education Details HEP    Person(s) Educated Patient    Methods Explanation;Demonstration;Tactile cues;Verbal cues;Handout    Comprehension Verbalized understanding;Returned demonstration;Verbal cues required;Tactile cues required               PT Long Term Goals - 07/09/20 1027      PT LONG TERM GOAL #1   Title improve tissue extensibility with patient to demonstrated increased mobility and ROM to allow more functional movement for transitional movements    Time 6    Period Weeks    Status New    Target Date 08/20/20      PT LONG TERM GOAL #2   Title Decrease pain by 25-50% allowing patient to perform functional activities with greater ease    Time 6    Period Days    Status New    Target Date 08/20/20      PT LONG TERM GOAL #3   Title Patient to demonstrate proper transitional movements for sit to lying down; lying to sitting    Time 6    Period Weeks    Status New    Target Date 08/20/20      PT LONG TERM GOAL #4   Title Independent in HEP    Time 6    Period Weeks    Status New    Target Date 08/20/20      PT  LONG TERM GOAL #5   Title Improve FOTO to </= 51% limitation    Time 6    Period Weeks    Status New    Target Date 08/20/20      Additional Long Term Goals   Additional Long Term Goals Yes      PT LONG TERM GOAL #6   Title Improve functional movement and independent in aquatic exercise to transition to community water exercise program    Time 6    Period Weeks    Status New    Target Date 08/20/20                 Plan - 07/15/20 1346    Clinical Impression Statement Continued pain. Added exercies to address specific areas of tightness. Trial of DN for muscular tightness. Patient does not tolerate soft tissue work well.    Rehab Potential Good    PT Frequency 2x / week    PT Duration 6 weeks    PT Treatment/Interventions Patient/family education;ADLs/Self Care Home Management;Aquatic Therapy;Cryotherapy;Electrical Stimulation;Iontophoresis 4mg /ml Dexamethasone;Moist Heat;Traction;Ultrasound;Functional mobility training;Therapeutic activities;Therapeutic exercise;Balance training;Neuromuscular re-education;Manual techniques;Dry needling;Taping    PT Next Visit Plan review HEP; progress with myofacial release and deep tissue work Lt > Rt psoas/posterior hip; ball release work; joint mobs through hips; core stabilization and strengthening; assess trial of DN as indicated; has TENS unit at home; modalities as indicated    PT [place]; [facility]    Consulted and Agree with Plan of Care Patient           Patient will benefit from skilled therapeutic intervention in order to improve the following deficits and impairments:     Visit Diagnosis: Chronic bilateral low back pain with left-sided sciatica  Other symptoms and signs involving the musculoskeletal system  Muscle weakness (generalized)  Acute bilateral low back pain without sciatica     Problem List Patient Active Problem List   Diagnosis Date Noted  . DDD (degenerative disc disease), cervical  07/06/2020  . Left lumbar radiculitis 05/06/2020  . Bilateral wrist  pain 05/06/2020  . Inflammatory arthritis 03/13/2020  . Osteoarthritis of left little finger 07/31/2018  . Acute bronchitis with COPD ([place]) 05/10/2018  . Impacted cerumen of left ear 03/07/2018  . History of left mastoidectomy 03/07/2018  . Tympanosclerosis, left ear 03/07/2018  . Cardiac risk counseling 09/05/2017  . Essential hypertension 09/05/2017  . Mixed hyperlipidemia 09/05/2017  . History of Bell's palsy 08/15/2017  . History of osteoporosis 08/15/2017  . History of colon polyps 08/15/2017  . Diarrhea 08/15/2017  . History of nonmelanoma skin cancer 08/15/2017  . Brain aneurysm 08/15/2017  . Bilateral hearing loss 08/15/2017  . H/O hysterectomy for benign disease 08/15/2017    Gabryel Files Nilda Simmer PT, MPH  07/15/2020, 2:35 PM  [facility] [place], [place], [address] Phone: [phone]   Fax:  [phone]  Name: Sashay Felling MRN: [id number] Date of Birth: [birth year]

## 2020-07-16 ENCOUNTER — Encounter: Payer: Self-pay | Admitting: Rehabilitative and Restorative Service Providers"

## 2020-07-16 ENCOUNTER — Ambulatory Visit (INDEPENDENT_AMBULATORY_CARE_PROVIDER_SITE_OTHER): Payer: Medicare HMO | Admitting: Rehabilitative and Restorative Service Providers"

## 2020-07-16 DIAGNOSIS — R29898 Other symptoms and signs involving the musculoskeletal system: Secondary | ICD-10-CM

## 2020-07-16 DIAGNOSIS — M545 Low back pain, unspecified: Secondary | ICD-10-CM

## 2020-07-16 DIAGNOSIS — M5442 Lumbago with sciatica, left side: Secondary | ICD-10-CM | POA: Diagnosis not present

## 2020-07-16 DIAGNOSIS — M6281 Muscle weakness (generalized): Secondary | ICD-10-CM | POA: Diagnosis not present

## 2020-07-16 DIAGNOSIS — G8929 Other chronic pain: Secondary | ICD-10-CM | POA: Diagnosis not present

## 2020-07-16 NOTE — Therapy (Signed)
[place], [place], [address] Phone: [phone]   Fax:  [phone]  Physical Therapy Treatment  Patient Details  Name: Krista [name] MRN: [id number] Date of Birth: 1951-01-04 Referring Provider (PT): Dr Sophronia Simas    Encounter Date: 07/16/2020   PT End of Session - 07/16/20 1403    Visit Number 3    Number of Visits 12    Date for PT Re-Evaluation 08/20/20    PT Start Time 1400    PT Stop Time 1444   moist heat end of treatment   PT Time Calculation (min) 44 min    Activity Tolerance Patient tolerated treatment well           Past Medical History:  Diagnosis Date  . Arthritis   . Bell palsy   . Brain aneurysm   . Hyperlipidemia   . Hypertension   . Osteoporosis     Past Surgical History:  Procedure Laterality Date  . ABDOMINAL HYSTERECTOMY    . CEREBRAL ANEURYSM REPAIR    . EYE SURGERY     cararact  . FOOT SURGERY    . HAND SURGERY    . MASTOIDECTOMY Left     There were no vitals filed for this visit.   Subjective Assessment - 07/16/20 1403    Subjective Patient reports that she was very sore and hurt in the hip as well as the neck area following DN - does not seem to be hurting as much today. Has not done the exercises. Wants to try manual work today maybe DN next visit.    Currently in Pain? Yes    Pain Score 8     Pain Location Hip    Pain Orientation Left    Pain Descriptors / Indicators Tightness    Pain Type Acute pain;Chronic pain    Pain Onset More than a month ago    Pain Frequency Constant                             [facility] Adult PT Treatment/Exercise - 07/16/20 0001      Lumbar Exercises: Stretches   Passive Hamstring Stretch Right;Left;2 reps;30 seconds   seated - hinge at hips    Hip Flexor Stretch Right;Left;2 reps;30 seconds   seated    Prone on Elbows Stretch 3 reps;30 seconds    Quad Stretch Right;Left;2 reps;30 seconds   prone w/  strap    Gastroc Stretch Right;Left;2 reps;30 seconds      Lumbar Exercises: Aerobic   Nustep L5 x 5 min UE's 9       Shoulder Exercises: Stretch   Other Shoulder Stretches doorway stretch 3 positions  30 sec hold x 2 reps       Moist Heat Therapy   Number Minutes Moist Heat 14 Minutes    Moist Heat Location Lumbar Spine;Cervical      Manual Therapy   Soft tissue mobilization pt supine - STM ant/lat/post cervical musculature; upper trap; leveator; anterior Lt lower quarter through the psoas and hip flexors     Passive ROM PROM and stretch for cervical spine into flexion and flexion with slight rotation                       PT Long Term Goals - 07/09/20 1027      PT LONG TERM GOAL #1   Title improve tissue extensibility with patient  to demonstrated increased mobility and ROM to allow more functional movement for transitional movements    Time 6    Period Weeks    Status New    Target Date 08/20/20      PT LONG TERM GOAL #2   Title Decrease pain by 25-50% allowing patient to perform functional activities with greater ease    Time 6    Period Days    Status New    Target Date 08/20/20      PT LONG TERM GOAL #3   Title Patient to demonstrate proper transitional movements for sit to lying down; lying to sitting    Time 6    Period Weeks    Status New    Target Date 08/20/20      PT LONG TERM GOAL #4   Title Independent in HEP    Time 6    Period Weeks    Status New    Target Date 08/20/20      PT LONG TERM GOAL #5   Title Improve FOTO to </= 51% limitation    Time 6    Period Weeks    Status New    Target Date 08/20/20      Additional Long Term Goals   Additional Long Term Goals Yes      PT LONG TERM GOAL #6   Title Improve functional movement and independent in aquatic exercise to transition to community water exercise program    Time 6    Period Weeks    Status New    Target Date 08/20/20                 Plan - 07/16/20 1405     Clinical Impression Statement Increased pain and soreness in the Lt hip and cervical area following DN. Continued pain but pt reports that it is a little less than it was. Continued with exercise and trial of manual work.    Rehab Potential Good    PT Frequency 2x / week    PT Duration 6 weeks    PT Treatment/Interventions Patient/family education;ADLs/Self Care Home Management;Aquatic Therapy;Cryotherapy;Electrical Stimulation;Iontophoresis 4mg /ml Dexamethasone;Moist Heat;Traction;Ultrasound;Functional mobility training;Therapeutic activities;Therapeutic exercise;Balance training;Neuromuscular re-education;Manual techniques;Dry needling;Taping    PT Next Visit Plan review HEP; progress with myofacial release and deep tissue work Lt > Rt psoas/posterior hip; ball release work; joint mobs through hips; core stabilization and strengthening; continued DN as indicated; has TENS unit at home; modalities as indicated    PT [place]; [facility]    Consulted and Agree with Plan of Care Patient           Patient will benefit from skilled therapeutic intervention in order to improve the following deficits and impairments:     Visit Diagnosis: Chronic bilateral low back pain with left-sided sciatica  Other symptoms and signs involving the musculoskeletal system  Muscle weakness (generalized)  Acute bilateral low back pain without sciatica     Problem List Patient Active Problem List   Diagnosis Date Noted  . DDD (degenerative disc disease), cervical 07/06/2020  . Left lumbar radiculitis 05/06/2020  . Bilateral wrist pain 05/06/2020  . Inflammatory arthritis 03/13/2020  . Osteoarthritis of left little finger 07/31/2018  . Acute bronchitis with COPD ([place]) 05/10/2018  . Impacted cerumen of left ear 03/07/2018  . History of left mastoidectomy 03/07/2018  . Tympanosclerosis, left ear 03/07/2018  . Cardiac risk counseling 09/05/2017  . Essential hypertension 09/05/2017  . Mixed  hyperlipidemia 09/05/2017  .  History of Bell's palsy 08/15/2017  . History of osteoporosis 08/15/2017  . History of colon polyps 08/15/2017  . Diarrhea 08/15/2017  . History of nonmelanoma skin cancer 08/15/2017  . Brain aneurysm 08/15/2017  . Bilateral hearing loss 08/15/2017  . H/O hysterectomy for benign disease 08/15/2017    Janette Harvie Nilda Simmer PT, MPH  07/16/2020, 2:39 PM  [facility] [place], [place], [address] Phone: [phone]   Fax:  [phone]  Name: Krista [name] MRN: [id number] Date of Birth: Apr 11, 1951

## 2020-07-21 ENCOUNTER — Encounter: Payer: Self-pay | Admitting: Rehabilitative and Restorative Service Providers"

## 2020-07-21 ENCOUNTER — Ambulatory Visit (INDEPENDENT_AMBULATORY_CARE_PROVIDER_SITE_OTHER): Payer: Medicare HMO | Admitting: Rehabilitative and Restorative Service Providers"

## 2020-07-21 ENCOUNTER — Other Ambulatory Visit: Payer: Self-pay

## 2020-07-21 DIAGNOSIS — M5442 Lumbago with sciatica, left side: Secondary | ICD-10-CM | POA: Diagnosis not present

## 2020-07-21 DIAGNOSIS — M545 Low back pain, unspecified: Secondary | ICD-10-CM

## 2020-07-21 DIAGNOSIS — G8929 Other chronic pain: Secondary | ICD-10-CM | POA: Diagnosis not present

## 2020-07-21 DIAGNOSIS — M6281 Muscle weakness (generalized): Secondary | ICD-10-CM

## 2020-07-21 DIAGNOSIS — R29898 Other symptoms and signs involving the musculoskeletal system: Secondary | ICD-10-CM | POA: Diagnosis not present

## 2020-07-21 NOTE — Therapy (Signed)
[place], [place], [address] Phone: [phone]   Fax:  [phone]  Physical Therapy Treatment  Patient Details  Name: Krista [name] MRN: [id number] Date of Birth: 10-Aug-1951 Referring Provider (PT): Dr Sophronia Simas    Encounter Date: 07/21/2020   PT End of Session - 07/21/20 1025    Visit Number 4    Number of Visits 12    Date for PT Re-Evaluation 08/20/20    PT Start Time 1020    PT Stop Time 1112    PT Time Calculation (min) 52 min    Activity Tolerance Patient tolerated treatment well           Past Medical History:  Diagnosis Date  . Arthritis   . Bell palsy   . Brain aneurysm   . Hyperlipidemia   . Hypertension   . Osteoporosis     Past Surgical History:  Procedure Laterality Date  . ABDOMINAL HYSTERECTOMY    . CEREBRAL ANEURYSM REPAIR    . EYE SURGERY     cararact  . FOOT SURGERY    . HAND SURGERY    . MASTOIDECTOMY Left     There were no vitals filed for this visit.   Subjective Assessment - 07/21/20 1030    Subjective Neck    Currently in Pain? Yes    Pain Score 8     Pain Orientation Left    Pain Descriptors / Indicators Tightness    Pain Type Acute pain;Chronic pain    Pain Onset More than a month ago    Pain Frequency Constant                             [facility] Adult PT Treatment/Exercise - 07/21/20 0001      Self-Care   Self-Care Other Self-Care Comments    Other Self-Care Comments  to avoid sitting with ankles/leg crossed or sitting on one foot tucked under her; discussed sleeping positions       Lumbar Exercises: Stretches   Passive Hamstring Stretch Right;Left;2 reps;30 seconds   seated - hinge at hips    Hip Flexor Stretch Right;Left;2 reps;30 seconds   seated    Prone on Elbows Stretch 3 reps;30 seconds    Quad Stretch Right;Left;2 reps;30 seconds   prone w/ strap    ITB Stretch Left;2 reps;30 seconds   supine with strap     Piriformis Stretch Left;2 reps;30 seconds    Piriformis Stretch Limitations supine travell    Gastroc Stretch Right;Left;2 reps;30 seconds      Lumbar Exercises: Standing   Other Standing Lumbar Exercises walking 40 ft x 4 reps following DN and exercise       Moist Heat Therapy   Number Minutes Moist Heat 15 Minutes    Moist Heat Location Lumbar Spine;Cervical      Manual Therapy   Manual therapy comments skilled palpation for assessment of response to DN/manual work     Soft tissue mobilization prone working through the [employer] posterior hip/buttock - minimal tolerance      Myofascial Release Lt posterior hip             Trigger Point Dry Needling - 07/21/20 0001    Dry Needling Comments Lt posterior hip    Gluteus Medius Response Palpable increased muscle length    Gluteus Maximus Response Palpable increased muscle length    Piriformis Response Palpable increased muscle length  Tensor Fascia Lata Response Palpable increased muscle length                PT Education - 07/21/20 1102    Education Details HEP DN    Person(s) Educated Patient    Methods Explanation;Demonstration;Tactile cues;Verbal cues;Handout    Comprehension Verbalized understanding;Returned demonstration;Verbal cues required;Tactile cues required               PT Long Term Goals - 07/09/20 1027      PT LONG TERM GOAL #1   Title improve tissue extensibility with patient to demonstrated increased mobility and ROM to allow more functional movement for transitional movements    Time 6    Period Weeks    Status New    Target Date 08/20/20      PT LONG TERM GOAL #2   Title Decrease pain by 25-50% allowing patient to perform functional activities with greater ease    Time 6    Period Days    Status New    Target Date 08/20/20      PT LONG TERM GOAL #3   Title Patient to demonstrate proper transitional movements for sit to lying down; lying to sitting    Time 6    Period Weeks    Status New     Target Date 08/20/20      PT LONG TERM GOAL #4   Title Independent in HEP    Time 6    Period Weeks    Status New    Target Date 08/20/20      PT LONG TERM GOAL #5   Title Improve FOTO to </= 51% limitation    Time 6    Period Weeks    Status New    Target Date 08/20/20      Additional Long Term Goals   Additional Long Term Goals Yes      PT LONG TERM GOAL #6   Title Improve functional movement and independent in aquatic exercise to transition to community water exercise program    Time 6    Period Weeks    Status New    Target Date 08/20/20                 Plan - 07/21/20 1052    Clinical Impression Statement Neck feels better - not hurting so much now. Lt hip is about the same. Agreeable to try DN again. Tolerated only fair. Patient has persistent muscular tightness through the Lt posterior hip - some release noted with DN and stretch. Pt does not tolerate manual work well. Discussed positions to avoid - try not to cross ankles/legs.    Rehab Potential Good    PT Frequency 2x / week    PT Duration 6 weeks    PT Treatment/Interventions Patient/family education;ADLs/Self Care Home Management;Aquatic Therapy;Cryotherapy;Electrical Stimulation;Iontophoresis 4mg /ml Dexamethasone;Moist Heat;Traction;Ultrasound;Functional mobility training;Therapeutic activities;Therapeutic exercise;Balance training;Neuromuscular re-education;Manual techniques;Dry needling;Taping    PT Next Visit Plan review HEP; progress with myofacial release and deep tissue work Lt > Rt psoas/posterior hip; ball release work; joint mobs through hips; core stabilization and strengthening; continued DN as indicated; has TENS unit at home; modalities as indicated. Assess response to DN Lt posterior hip for a second time. Avoiding crossing legs    PT Home Exercise Plan [place]; [facility]           Patient will benefit from skilled therapeutic intervention in order to improve the following deficits and  impairments:     Visit Diagnosis:  Chronic bilateral low back pain with left-sided sciatica  Other symptoms and signs involving the musculoskeletal system  Muscle weakness (generalized)  Acute bilateral low back pain without sciatica     Problem List Patient Active Problem List   Diagnosis Date Noted  . DDD (degenerative disc disease), cervical 07/06/2020  . Left lumbar radiculitis 05/06/2020  . Bilateral wrist pain 05/06/2020  . Inflammatory arthritis 03/13/2020  . Osteoarthritis of left little finger 07/31/2018  . Acute bronchitis with COPD ([place]) 05/10/2018  . Impacted cerumen of left ear 03/07/2018  . History of left mastoidectomy 03/07/2018  . Tympanosclerosis, left ear 03/07/2018  . Cardiac risk counseling 09/05/2017  . Essential hypertension 09/05/2017  . Mixed hyperlipidemia 09/05/2017  . History of Bell's palsy 08/15/2017  . History of osteoporosis 08/15/2017  . History of colon polyps 08/15/2017  . Diarrhea 08/15/2017  . History of nonmelanoma skin cancer 08/15/2017  . Brain aneurysm 08/15/2017  . Bilateral hearing loss 08/15/2017  . H/O hysterectomy for benign disease 08/15/2017    Marguerita Stapp Nilda Simmer PT, MPH  07/21/2020, 11:19 AM  [facility] [place], [place], [address] Phone: [phone]   Fax:  [phone]  Name: Krista [name] MRN: [id number] Date of Birth: [birth year]

## 2020-07-21 NOTE — Patient Instructions (Signed)
Access Code: [email or web address]: [email or web address]: 08/17/2021Prepared by: Mollye Guinta HoltExercises  Prone Press Up on Elbows - 2 x daily - 7 x weekly - 1 sets - 3 reps - 30 sec hold  Prone Quadriceps Stretch with Strap - 2 x daily - 7 x weekly - 1 sets - 3 reps - 30 sec hold  Gastroc Stretch on Wall - 2 x daily - 7 x weekly - 1 sets - 3 reps - 30 sec hold  Doorway Pec Stretch at 60 Degrees Abduction - 3 x daily - 7 x weekly - 3 reps - 1 sets  Doorway Pec Stretch at 90 Degrees Abduction - 3 x daily - 7 x weekly - 3 reps - 1 sets - 30 seconds hold  Doorway Pec Stretch at 120 Degrees Abduction - 3 x daily - 7 x weekly - 3 reps - 1 sets - 30 second hold hold  Seated Hamstring Stretch - 2 x daily - 7 x weekly - 1 sets - 3 reps - 30 sec hold  Hooklying Hamstring Stretch with Strap - 2 x daily - 7 x weekly - 1 sets - 3 reps - 30 sec hold  Supine ITB Stretch with Strap - 2 x daily - 7 x weekly - 1 sets - 3 reps - 30 sec hold  Supine Piriformis Stretch with Leg Straight - 2 x daily - 7 x weekly - 1 sets - 3 reps - 30 sec hold

## 2020-07-23 ENCOUNTER — Other Ambulatory Visit: Payer: Self-pay

## 2020-07-23 ENCOUNTER — Encounter: Payer: Self-pay | Admitting: Rehabilitative and Restorative Service Providers"

## 2020-07-23 ENCOUNTER — Ambulatory Visit (INDEPENDENT_AMBULATORY_CARE_PROVIDER_SITE_OTHER): Payer: Medicare HMO | Admitting: Rehabilitative and Restorative Service Providers"

## 2020-07-23 DIAGNOSIS — G8929 Other chronic pain: Secondary | ICD-10-CM

## 2020-07-23 DIAGNOSIS — M545 Low back pain, unspecified: Secondary | ICD-10-CM

## 2020-07-23 DIAGNOSIS — M6281 Muscle weakness (generalized): Secondary | ICD-10-CM | POA: Diagnosis not present

## 2020-07-23 DIAGNOSIS — R29898 Other symptoms and signs involving the musculoskeletal system: Secondary | ICD-10-CM | POA: Diagnosis not present

## 2020-07-23 DIAGNOSIS — M5442 Lumbago with sciatica, left side: Secondary | ICD-10-CM

## 2020-07-23 NOTE — Therapy (Signed)
[place], [place], [address] Phone: [phone]   Fax:  [phone]  Physical Therapy Treatment  Patient Details  Name: Krista [name] MRN: [id number] Date of Birth: May 22, 1951 Referring Provider (PT): Dr Sophronia Simas    Encounter Date: 07/23/2020   PT End of Session - 07/23/20 1021    Visit Number 5    Number of Visits 12    Date for PT Re-Evaluation 08/20/20    PT Start Time 1018    PT Stop Time 1108    PT Time Calculation (min) 50 min    Activity Tolerance Patient tolerated treatment well           Past Medical History:  Diagnosis Date  . Arthritis   . Bell palsy   . Brain aneurysm   . Hyperlipidemia   . Hypertension   . Osteoporosis     Past Surgical History:  Procedure Laterality Date  . ABDOMINAL HYSTERECTOMY    . CEREBRAL ANEURYSM REPAIR    . EYE SURGERY     cararact  . FOOT SURGERY    . HAND SURGERY    . MASTOIDECTOMY Left     There were no vitals filed for this visit.   Subjective Assessment - 07/23/20 1022    Subjective Working ot change habits at home - not crossing legs; sitting on foot. Also modifying the chair she sits in at home. She was very sore after last therapy session but feels OK today. Neck is hurting a little bit again 5-6/10    Currently in Pain? Yes    Pain Score 8     Pain Location Hip    Pain Orientation Left    Pain Descriptors / Indicators Tightness    Pain Type Acute pain                             [facility] Adult PT Treatment/Exercise - 07/23/20 0001      Therapeutic Activites    Therapeutic Activities --   myofacial ball release work post and Manufacturing systems engineer Lt hip      Lumbar Exercises: Hydrologist Right;Left;2 reps;30 seconds   seated - hinge at hips    Hip Flexor Stretch Right;Left;2 reps;30 seconds   seated    Prone on Elbows Stretch 3 reps;30 seconds    Quad Stretch Right;Left;2 reps;30 seconds   prone  w/ strap    ITB Stretch Left;2 reps;30 seconds   supine with strap    Piriformis Stretch Left;2 reps;30 seconds    Piriformis Stretch Limitations supine travell    Gastroc Stretch Right;Left;2 reps;30 seconds      Lumbar Exercises: Aerobic   Nustep L5 x 5 min UE's 9       Lumbar Exercises: Standing   Other Standing Lumbar Exercises walking 40 ft x 4 reps following DN and exercise       Manual Therapy   Manual therapy comments skilled palpation for assessment of response to DN/manual work     Soft tissue mobilization supine working through the anterior Lt hip and proximal quad             Trigger Point Dry Needling - 07/23/20 0001    Consent Given? Yes    Education Handout Provided Previously provided    Dry Needling Comments Lt     Rectus femoris Response Palpable increased muscle length    Transverse abdominis Response  Palpable increased muscle length    Iliopsoas Response Palpable increased muscle length                     PT Long Term Goals - 07/09/20 1027      PT LONG TERM GOAL #1   Title improve tissue extensibility with patient to demonstrated increased mobility and ROM to allow more functional movement for transitional movements    Time 6    Period Weeks    Status New    Target Date 08/20/20      PT LONG TERM GOAL #2   Title Decrease pain by 25-50% allowing patient to perform functional activities with greater ease    Time 6    Period Days    Status New    Target Date 08/20/20      PT LONG TERM GOAL #3   Title Patient to demonstrate proper transitional movements for sit to lying down; lying to sitting    Time 6    Period Weeks    Status New    Target Date 08/20/20      PT LONG TERM GOAL #4   Title Independent in HEP    Time 6    Period Weeks    Status New    Target Date 08/20/20      PT LONG TERM GOAL #5   Title Improve FOTO to </= 51% limitation    Time 6    Period Weeks    Status New    Target Date 08/20/20      Additional Long  Term Goals   Additional Long Term Goals Yes      PT LONG TERM GOAL #6   Title Improve functional movement and independent in aquatic exercise to transition to community water exercise program    Time 6    Period Weeks    Status New    Target Date 08/20/20                 Plan - 07/23/20 1024    Clinical Impression Statement Patient working to change habits and improve postures and positions at home. Working on her HEP. Working on myofacial release and soft tissue mobilization through the anterior hip    Rehab Potential Good    PT Frequency 2x / week    PT Duration 6 weeks    PT Treatment/Interventions Patient/family education;ADLs/Self Care Home Management;Aquatic Therapy;Cryotherapy;Electrical Stimulation;Iontophoresis 4mg /ml Dexamethasone;Moist Heat;Traction;Ultrasound;Functional mobility training;Therapeutic activities;Therapeutic exercise;Balance training;Neuromuscular re-education;Manual techniques;Dry needling;Taping    PT Next Visit Plan review HEP; progress with myofacial release and deep tissue work Lt > Rt psoas/posterior hip; ball release work; joint mobs through hips; core stabilization and strengthening; continued DN as indicated; has TENS unit at home; modalities as indicated. Assess response to DN Lt anterior hip. Reminders for avoid crossing legs    PT Home Exercise Plan [place]; [facility]    Consulted and Agree with Plan of Care Patient           Patient will benefit from skilled therapeutic intervention in order to improve the following deficits and impairments:     Visit Diagnosis: Chronic bilateral low back pain with left-sided sciatica  Other symptoms and signs involving the musculoskeletal system  Muscle weakness (generalized)  Acute bilateral low back pain without sciatica     Problem List Patient Active Problem List   Diagnosis Date Noted  . DDD (degenerative disc disease), cervical 07/06/2020  . Left lumbar radiculitis 05/06/2020  . Bilateral  wrist pain 05/06/2020  . Inflammatory arthritis 03/13/2020  . Osteoarthritis of left little finger 07/31/2018  . Acute bronchitis with COPD ([place]) 05/10/2018  . Impacted cerumen of left ear 03/07/2018  . History of left mastoidectomy 03/07/2018  . Tympanosclerosis, left ear 03/07/2018  . Cardiac risk counseling 09/05/2017  . Essential hypertension 09/05/2017  . Mixed hyperlipidemia 09/05/2017  . History of Bell's palsy 08/15/2017  . History of osteoporosis 08/15/2017  . History of colon polyps 08/15/2017  . Diarrhea 08/15/2017  . History of nonmelanoma skin cancer 08/15/2017  . Brain aneurysm 08/15/2017  . Bilateral hearing loss 08/15/2017  . H/O hysterectomy for benign disease 08/15/2017    Kerina Simoneau Nilda Simmer PT, MPH  07/23/2020, 11:02 AM  [facility] [place], [place], [address] Phone: [phone]   Fax:  [phone]  Name: Krista [name] MRN: [id number] Date of Birth: Oct 29, 1951

## 2020-07-27 ENCOUNTER — Encounter: Payer: Self-pay | Admitting: Emergency Medicine

## 2020-07-27 ENCOUNTER — Emergency Department (INDEPENDENT_AMBULATORY_CARE_PROVIDER_SITE_OTHER)
Admission: EM | Admit: 2020-07-27 | Discharge: 2020-07-27 | Disposition: A | Discharge status: Home / Self Care | Payer: Medicare HMO | Source: Home / Self Care

## 2020-07-27 ENCOUNTER — Other Ambulatory Visit: Payer: Self-pay

## 2020-07-27 ENCOUNTER — Emergency Department: Payer: Medicare HMO

## 2020-07-27 ENCOUNTER — Emergency Department (INDEPENDENT_AMBULATORY_CARE_PROVIDER_SITE_OTHER): Payer: Medicare HMO

## 2020-07-27 DIAGNOSIS — W19XXXA Unspecified fall, initial encounter: Secondary | ICD-10-CM

## 2020-07-27 DIAGNOSIS — S90122A Contusion of left lesser toe(s) without damage to nail, initial encounter: Secondary | ICD-10-CM | POA: Diagnosis not present

## 2020-07-27 DIAGNOSIS — J449 Chronic obstructive pulmonary disease, unspecified: Secondary | ICD-10-CM | POA: Diagnosis not present

## 2020-07-27 DIAGNOSIS — M79675 Pain in left toe(s): Secondary | ICD-10-CM

## 2020-07-27 DIAGNOSIS — S99922A Unspecified injury of left foot, initial encounter: Secondary | ICD-10-CM | POA: Diagnosis not present

## 2020-07-27 DIAGNOSIS — M7989 Other specified soft tissue disorders: Secondary | ICD-10-CM | POA: Diagnosis not present

## 2020-07-27 NOTE — ED Provider Notes (Signed)
[name]    CSN: [id number] Arrival date & time: 07/27/20  1348      History   Chief Complaint Chief Complaint  Patient presents with  . Foot Injury    left 5th toe    HPI Krista [name] is a 69 y.o. female.   HPI  Krista [name] is a 69 y.o. female presenting to UC with c/o Left great toe pain and bruising after getting her toe caught on the rug and bending it back when she stood up. Pain is aching and shooting, 4/10.  She took Meloxicam this morning.  No hx of fracture to same foot or toe. She is currently in PT and doing water aerobics, pt wonders if she can keep going with her injured toe.    Past Medical History:  Diagnosis Date  . Arthritis   . Bell palsy   . Brain aneurysm   . Hyperlipidemia   . Hypertension   . Osteoporosis     Patient Active Problem List   Diagnosis Date Noted  . DDD (degenerative disc disease), cervical 07/06/2020  . Left lumbar radiculitis 05/06/2020  . Bilateral wrist pain 05/06/2020  . Inflammatory arthritis 03/13/2020  . Osteoarthritis of left little finger 07/31/2018  . Acute bronchitis with COPD ([place]) 05/10/2018  . Impacted cerumen of left ear 03/07/2018  . History of left mastoidectomy 03/07/2018  . Tympanosclerosis, left ear 03/07/2018  . Cardiac risk counseling 09/05/2017  . Essential hypertension 09/05/2017  . Mixed hyperlipidemia 09/05/2017  . History of Bell's palsy 08/15/2017  . History of osteoporosis 08/15/2017  . History of colon polyps 08/15/2017  . Diarrhea 08/15/2017  . History of nonmelanoma skin cancer 08/15/2017  . Brain aneurysm 08/15/2017  . Bilateral hearing loss 08/15/2017  . H/O hysterectomy for benign disease 08/15/2017    Past Surgical History:  Procedure Laterality Date  . ABDOMINAL HYSTERECTOMY    . CEREBRAL ANEURYSM REPAIR    . EYE SURGERY     cararact  . FOOT SURGERY    . HAND SURGERY    . MASTOIDECTOMY Left     OB History   No obstetric history on file.      Home  Medications    Prior to Admission medications   Medication Sig Start Date End Date Taking? Authorizing Provider  acyclovir (ZOVIRAX) 400 MG tablet Take 1 tablet (400 mg total) by mouth 3 (three) times daily. 07/06/20  Yes Emeterio Reeve, DO  Calcium-Magnesium-Vitamin D (CITRACAL CALCIUM+D) 600-40-500 MG-MG-UNIT TB24 Take by mouth.   Yes [provider]  cyclobenzaprine (FLEXERIL) 10 MG tablet TAKE 1/2 TO 1 TABLET BY MOUTH 3 TIMES A DAY AS NEEDED MUSCLE SPASMS 05/08/20  Yes Emeterio Reeve, DO  diphenhydramine-acetaminophen (TYLENOL PM) 25-500 MG TABS tablet Take 2 tablets by mouth at bedtime as needed.    Yes [provider]  famotidine (PEPCID) 40 MG tablet TAKE 1 TABLET BY MOUTH EVERYDAY AT BEDTIME 06/01/20  Yes Emeterio Reeve, DO  hydrochlorothiazide (HYDRODIURIL) 12.5 MG tablet Take 1 tablet (12.5 mg total) by mouth daily. 02/25/20  Yes Emeterio Reeve, DO  meloxicam (MOBIC) 15 MG tablet TAKE 1 TABLET BY MOUTH EVERY DAY IN THE MORNING WITH A MEAL AS NEEDED FOR PAIN 06/29/20  Yes Silverio Decamp, MD  pantoprazole (PROTONIX) 40 MG tablet TAKE 1 TABLET BY MOUTH TWICE A DAY 06/02/20  Yes Emeterio Reeve, DO  pravastatin (PRAVACHOL) 40 MG tablet Take 1 tablet (40 mg total) by mouth daily. 02/25/20  Yes Emeterio Reeve, DO  ipratropium (ATROVENT) 0.06 % nasal spray Place 2 sprays into both nostrils 4 (four) times daily. 03/31/20   Emeterio Reeve, DO  lidocaine (XYLOCAINE) 2 % solution Use as directed 15 mLs in the mouth or throat every 3 (three) hours as needed (mouth/throat pain - gargle and spit). 05/29/20   Orma Render, NP  loratadine (CLARITIN) 10 MG tablet Take 10 mg by mouth daily.    [provider]  magnesium oxide (MAG-OX) 400 MG tablet Take 2 tablets (800 mg total) by mouth at bedtime. 05/06/20   Silverio Decamp, MD    Family History Family History  Problem Relation Age of Onset  . Heart failure Mother   . Cancer Father   . Cancer  Brother     Social History Social History   Tobacco Use  . Smoking status: Former Smoker    Quit date: 12/05/2008    Years since quitting: 11.6  . Smokeless tobacco: Never Used  Vaping Use  . Vaping Use: Never used  Substance Use Topics  . Alcohol use: No  . Drug use: No     Allergies   Codeine, Kiwi extract, Other, Plum pulp, Tape, Tomato, and Prednisone   Review of Systems Review of Systems  Musculoskeletal: Positive for arthralgias and joint swelling.  Skin: Positive for color change. Negative for wound.  Neurological: Negative for weakness and numbness.     Physical Exam Triage Vital Signs ED Triage Vitals  Enc Vitals Group     BP 07/27/20 1504 (!) 141/86     Pulse Rate 07/27/20 1504 94     Resp 07/27/20 1504 17     Temp 07/27/20 1504 98.7 F (37.1 C)     Temp Source 07/27/20 1504 Oral     SpO2 07/27/20 1504 98 %     Weight --      Height --      Head Circumference --      Peak Flow --      Pain Score 07/27/20 1505 4     Pain Loc --      Pain Edu? --      Excl. in [place]? --    No data found.  Updated Vital Signs BP (!) 141/86 (BP Location: Right Arm)   Pulse 94   Temp 98.7 F (37.1 C) (Oral)   Resp 17   SpO2 98%   Visual Acuity Right Eye Distance:   Left Eye Distance:   Bilateral Distance:    Right Eye Near:   Left Eye Near:    Bilateral Near:     Physical Exam Vitals and nursing note reviewed.  Constitutional:      Appearance: She is well-developed.  HENT:     Head: Normocephalic and atraumatic.  Cardiovascular:     Rate and Rhythm: Normal rate.  Pulmonary:     Effort: Pulmonary effort is normal.  Musculoskeletal:        General: Swelling and tenderness present. Normal range of motion.     Cervical back: Normal range of motion.     Comments: Left little toe: mild edema, full ROM. Tenderness at the base.   Skin:    General: Skin is warm and dry.     Capillary Refill: Capillary refill takes less than 2 seconds.     Findings:  Bruising present.  Neurological:     Mental Status: She is alert and oriented to person, place, and time.     Sensory: No sensory deficit.  Psychiatric:  Behavior: Behavior normal.      UC Treatments / Results  Labs (all labs ordered are listed, but only abnormal results are displayed) Labs Reviewed - No data to display  EKG   Radiology Narrative & Impression  CLINICAL DATA:  Fifth toe pain after trauma.  EXAM: LEFT FOOT - COMPLETE 3+ VIEW  COMPARISON:  None.  FINDINGS: There is no evidence of fracture or dislocation. 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 Mild first MTP joint degenerative change. Soft tissue swelling about the lateral foot. Small calcaneal enthesophyte.  IMPRESSION: No acute fracture or malalignment.   Electronically Signed   By: Margaretha Sheffield MD   On: 07/27/2020 16:09     Procedures Procedures (including critical care time)  Medications Ordered in UC Medications - No data to display  Initial Impression / Assessment and Plan / UC Course  I have reviewed the triage vital signs and the nursing notes.  Pertinent labs & imaging results that were available during my care of the patient were reviewed by me and considered in my medical decision making (see chart for details).     Reviewed imaging with pt Treated with buddy taping F/u sports medicine as needed AVS given  Final Clinical Impressions(s) / UC Diagnoses   Final diagnoses:  Contusion of left lesser toe(s) w/o damage to nail, init     Discharge Instructions      Call to schedule a follow up appointment with your primary care provider or sports medicine for recheck of symptoms if not improving in 1-2 weeks.     ED Prescriptions    None     PDMP not reviewed this encounter.   Noe Gens, [place] 07/30/20 [phone]

## 2020-07-27 NOTE — ED Triage Notes (Addendum)
Injured left 5th toe on Saturday night - per pt it went out - pain is to left foot - bruising noted to left 5th toe Pfizer Vaccine  No OTC meds - meloxicam this am

## 2020-07-27 NOTE — Discharge Instructions (Signed)
  Call to schedule a follow up appointment with your primary care provider or sports medicine for recheck of symptoms if not improving in 1-2 weeks.

## 2020-07-28 ENCOUNTER — Encounter: Payer: Self-pay | Admitting: Rehabilitative and Restorative Service Providers"

## 2020-07-28 ENCOUNTER — Ambulatory Visit (INDEPENDENT_AMBULATORY_CARE_PROVIDER_SITE_OTHER): Payer: Medicare HMO | Admitting: Rehabilitative and Restorative Service Providers"

## 2020-07-28 DIAGNOSIS — M5442 Lumbago with sciatica, left side: Secondary | ICD-10-CM

## 2020-07-28 DIAGNOSIS — R29898 Other symptoms and signs involving the musculoskeletal system: Secondary | ICD-10-CM

## 2020-07-28 DIAGNOSIS — M6281 Muscle weakness (generalized): Secondary | ICD-10-CM | POA: Diagnosis not present

## 2020-07-28 DIAGNOSIS — M545 Low back pain, unspecified: Secondary | ICD-10-CM

## 2020-07-28 DIAGNOSIS — G8929 Other chronic pain: Secondary | ICD-10-CM

## 2020-07-28 NOTE — Patient Instructions (Signed)
   Aquatic Therapy: What to Expect!  Where:  [facility]  [address]  [place], [place]  [address] [phone]    NOTE: You will receive an automated phone message  reminding you of your appointment and it will say the   appointment is at [facility] in [place]. We are  working to fix this - just know that you will meet us at pool!                                 How to Prepare: . Please make sure you drink 8 ounces of water about one hour prior to your pool session. . Sign in at the front desk upon your arrival.   . If you have a caregiver, they must attend the entire session with you (unless your primary therapists feels this is not necessary). The caregiver will be responsible for assisting with dressing as well as any toileting needs.  . Please arrive IN YOUR SUIT and a few minutes prior to your appointment - this helps to avoid delays in starting your session. . Please make sure to attend to any toileting needs prior to entering the pool. . You can bring your pool bag with you and place it on bench near our work space.   . Once on the pool deck, your therapist will ask you to sign the Patient  Consent and Assignment of Benefits form. . Your therapist may take your blood pressure prior to, during, and after your session if indicated. . We usually try and create a home exercise program based on activities we do in the pool.  Please be thinking about who might be able to assist you in the pool should you want to participate in an aquatic home exercise program at the time of discharge.  Some patients do not want to or do not have the ability to participate in an aquatic home program - this is not a barrier in any way to you participating in aquatic therapy as part of your current therapy plan!    About the pool: 1. Entering the pool: Your therapist will assist you; there are multiple ways to enter including stairs with railings, a walk-in ramp, a roll-in  chair and a mechanical lift. Your therapist will determine the most appropriate way for you. 2. Water temperature is usually between 86-87 degrees. 3. There may be other swimmers in the pool at the same time. 4. All sessions are 45 minutes.     Contact Info:  [facility] -  [address], [address] [place], [place] [address]                   Please call our [place] office if you need to cancel or reschedule.   [phone]       

## 2020-07-28 NOTE — Therapy (Signed)
[place], [place], [address] Phone: [phone]   Fax:  [phone]  Physical Therapy Treatment  Patient Details  Name: Krista [name] MRN: [id number] Date of Birth: 1951/07/29 Referring Provider (PT): Dr Sophronia Simas    Encounter Date: 07/28/2020   PT End of Session - 07/28/20 1028    Visit Number 6    Number of Visits 12    Date for PT Re-Evaluation 08/20/20    PT Start Time 1020    PT Stop Time 1110    PT Time Calculation (min) 50 min    Activity Tolerance Patient tolerated treatment well           Past Medical History:  Diagnosis Date  . Arthritis   . Bell palsy   . Brain aneurysm   . Hyperlipidemia   . Hypertension   . Osteoporosis     Past Surgical History:  Procedure Laterality Date  . ABDOMINAL HYSTERECTOMY    . CEREBRAL ANEURYSM REPAIR    . EYE SURGERY     cararact  . FOOT SURGERY    . HAND SURGERY    . MASTOIDECTOMY Left     There were no vitals filed for this visit.   Subjective Assessment - 07/28/20 1029    Subjective Patient reports that she was doing better with no neck pain and less back pain after the last visit. She fell Saturday and injured her little toe on the Lt foot; irritated the Lt knee and tweaked her LB    Currently in Pain? Yes    Pain Score 6     Pain Location Back    Pain Orientation Left    Pain Descriptors / Indicators Sharp;Aching    Pain Type Acute pain                             [facility] Adult PT Treatment/Exercise - 07/28/20 0001      Lumbar Exercises: Stretches   Passive Hamstring Stretch Right;Left;2 reps;30 seconds   seated - hinge at hips    Hip Flexor Stretch Right;Left;2 reps;30 seconds   seated    Prone on Elbows Stretch 3 reps;30 seconds    Quad Stretch Right;Left;2 reps;30 seconds   prone w/ strap    ITB Stretch Left;2 reps;30 seconds   supine with strap    Piriformis Stretch Left;2 reps;30 seconds     Piriformis Stretch Limitations supine travell    Gastroc Stretch Right;Left;2 reps;30 seconds      Lumbar Exercises: Aerobic   Nustep L5 x 5 min UE's 9       Lumbar Exercises: Standing   Other Standing Lumbar Exercises walking 40 ft x 4 reps following DN and exercise       Manual Therapy   Manual therapy comments skilled palpation for assessment of response to DN/manual work     Soft tissue mobilization supine working through the anterior Lt hip and proximal quad             Trigger Point Dry Needling - 07/28/20 0001    Consent Given? Yes    Education Handout Provided Previously provided    Dry Needling Comments Lt     Rectus femoris Response Palpable increased muscle length    Gluteus Medius Response Palpable increased muscle length    Gluteus Maximus Response Palpable increased muscle length    Transverse abdominis Response Palpable increased muscle length  Iliopsoas Response Palpable increased muscle length                PT Education - 07/28/20 1100    Education Details aquatic therapy    Person(s) Educated Patient    Methods Explanation;Handout    Comprehension Verbalized understanding               PT Long Term Goals - 07/09/20 1027      PT LONG TERM GOAL #1   Title improve tissue extensibility with patient to demonstrated increased mobility and ROM to allow more functional movement for transitional movements    Time 6    Period Weeks    Status New    Target Date 08/20/20      PT LONG TERM GOAL #2   Title Decrease pain by 25-50% allowing patient to perform functional activities with greater ease    Time 6    Period Days    Status New    Target Date 08/20/20      PT LONG TERM GOAL #3   Title Patient to demonstrate proper transitional movements for sit to lying down; lying to sitting    Time 6    Period Weeks    Status New    Target Date 08/20/20      PT LONG TERM GOAL #4   Title Independent in HEP    Time 6    Period Weeks    Status  New    Target Date 08/20/20      PT LONG TERM GOAL #5   Title Improve FOTO to </= 51% limitation    Time 6    Period Weeks    Status New    Target Date 08/20/20      Additional Long Term Goals   Additional Long Term Goals Yes      PT LONG TERM GOAL #6   Title Improve functional movement and independent in aquatic exercise to transition to community water exercise program    Time 6    Period Weeks    Status New    Target Date 08/20/20                 Plan - 07/28/20 1031    Clinical Impression Statement Patient returns with flare up of symptoms following fall on Saturday. She has increased pain in the back. Good response to pevious treatment - repeated DN today. Good response to treatment with decreased muscular tightness and increased tissue extensibility. Patient will be seen for aquatic therapy next visit.    Rehab Potential Good    PT Frequency 2x / week    PT Duration 6 weeks    PT Treatment/Interventions Patient/family education;ADLs/Self Care Home Management;Aquatic Therapy;Cryotherapy;Electrical Stimulation;Iontophoresis 4mg /ml Dexamethasone;Moist Heat;Traction;Ultrasound;Functional mobility training;Therapeutic activities;Therapeutic exercise;Balance training;Neuromuscular re-education;Manual techniques;Dry needling;Taping    PT Next Visit Plan review HEP; progress with myofacial release and deep tissue work Lt > Rt psoas/posterior hip; ball release work; joint mobs through hips; core stabilization and strengthening; continued DN as indicated; has TENS unit at home; modalities as indicated. Assess response to DN Lt anterior hip. Reminders for avoid crossing legs    PT Home Exercise Plan [place]; VHI    Consulted and Agree with Plan of Care Patient           Patient will benefit from skilled therapeutic intervention in order to improve the following deficits and impairments:     Visit Diagnosis: Chronic bilateral low back pain with left-sided sciatica  Other  symptoms and signs involving the musculoskeletal system  Muscle weakness (generalized)  Acute bilateral low back pain without sciatica     Problem List Patient Active Problem List   Diagnosis Date Noted  . DDD (degenerative disc disease), cervical 07/06/2020  . Left lumbar radiculitis 05/06/2020  . Bilateral wrist pain 05/06/2020  . Inflammatory arthritis 03/13/2020  . Osteoarthritis of left little finger 07/31/2018  . Acute bronchitis with COPD ([place]) 05/10/2018  . Impacted cerumen of left ear 03/07/2018  . History of left mastoidectomy 03/07/2018  . Tympanosclerosis, left ear 03/07/2018  . Cardiac risk counseling 09/05/2017  . Essential hypertension 09/05/2017  . Mixed hyperlipidemia 09/05/2017  . History of Bell's palsy 08/15/2017  . History of osteoporosis 08/15/2017  . History of colon polyps 08/15/2017  . Diarrhea 08/15/2017  . History of nonmelanoma skin cancer 08/15/2017  . Brain aneurysm 08/15/2017  . Bilateral hearing loss 08/15/2017  . H/O hysterectomy for benign disease 08/15/2017    Randon Somera Nilda Simmer PT, MPH  07/28/2020, 11:05 AM  [facility] [place], [place], [address] Phone: [phone]   Fax:  [phone]  Name: Krista [name] MRN: [id number] Date of Birth: Jun 20, 1951

## 2020-07-31 ENCOUNTER — Encounter: Payer: Self-pay | Admitting: Physical Therapy

## 2020-07-31 ENCOUNTER — Ambulatory Visit (INDEPENDENT_AMBULATORY_CARE_PROVIDER_SITE_OTHER): Payer: Medicare HMO | Admitting: Physical Therapy

## 2020-07-31 ENCOUNTER — Other Ambulatory Visit: Payer: Self-pay

## 2020-07-31 DIAGNOSIS — M6281 Muscle weakness (generalized): Secondary | ICD-10-CM | POA: Diagnosis not present

## 2020-07-31 DIAGNOSIS — M5442 Lumbago with sciatica, left side: Secondary | ICD-10-CM

## 2020-07-31 DIAGNOSIS — R29898 Other symptoms and signs involving the musculoskeletal system: Secondary | ICD-10-CM

## 2020-07-31 DIAGNOSIS — G8929 Other chronic pain: Secondary | ICD-10-CM

## 2020-07-31 NOTE — Therapy (Signed)
[place], [place], [address] Phone: [phone]   Fax:  [phone]  Physical Therapy Treatment  Patient Details  Name: Krista [name] MRN: [id number] Date of Birth: December 01, 1951 Referring Provider (PT): Dr Sophronia Simas    Encounter Date: 07/31/2020   PT End of Session - 07/31/20 1546    Visit Number 7    Number of Visits 12    Date for PT Re-Evaluation 08/20/20    PT Start Time 1229    PT Stop Time 1314    PT Time Calculation (min) 45 min    Activity Tolerance Patient tolerated treatment well           Past Medical History:  Diagnosis Date  . Arthritis   . Bell palsy   . Brain aneurysm   . Hyperlipidemia   . Hypertension   . Osteoporosis     Past Surgical History:  Procedure Laterality Date  . ABDOMINAL HYSTERECTOMY    . CEREBRAL ANEURYSM REPAIR    . EYE SURGERY     cararact  . FOOT SURGERY    . HAND SURGERY    . MASTOIDECTOMY Left     There were no vitals filed for this visit.   Subjective Assessment - 07/31/20 1534    Subjective Pt reports that the DN last session helped her feel better after recent fall.  She has been using TENS and exercised in water for 1 class this morning.    Pertinent History Chronic LBP; neck pain ; Bell's palsy; HTN; arthritis; osteoporesis; bilat feet and hand surgeries; Rt RCR; aneurysm surgery; ear surgery    Currently in Pain? Yes    Pain Score 7     Pain Location Back    Pain Orientation Left    Pain Descriptors / Indicators Aching    Aggravating Factors  walking > 1 hr, bending    Pain Relieving Factors TENS, ice, heat              [facility] PT Assessment - 07/31/20 0001      Assessment   Medical Diagnosis Lumbar radiculitis Lt > Rt     Referring Provider (PT) Dr Sophronia Simas     Onset Date/Surgical Date 07/05/20   worse in the past 1-2 months    Hand Dominance Right    Next MD Visit after 6 weeks of PT     Prior Therapy yes here for  LBP            Pt seen for aquatic therapy today.  Treatment took place in water 3.5-4 ft in depth at [facility]. Temp of water was 86.  Pt entered/exited the pool via ramp independently with bilat rail.  Treatment:   Forward gait x 100 meters, retro gait x 100 meters cues to increase Lt hip ext.  Side stepping Rt /Lt x 50 meters each direction; pt given cues to soften knees and not take as big of a step that her trunk tips away from leg, cue to engage core.   Lt / Rt standing hip flexor stretch x 30 sec each leg with hands on pool edge.  Low back stretch with feet on wall, hands on pool edge x 15 sec.  Pec stretch holding ladder rail x 15 sec each arm (high position).  Trial of Lt quad stretch with foot supported by noodle; limited tolerance. Instead pt held side of pool and grabbed ankle, pulling heel towards buttocks (improved tolerance).   Reviewed  pt's current exercises from pool class: pt demonstrated all exercises (mostly repetitive hip flexion).  Pt demonstrated exercise where she abducts leg and holds toes with hand, then attempts to straighten knee - reported "charlie horse" and increased low back pain.  Instructed pt to abdct leg and instead hold leg prox to knee; improved tolerance with no pain.   Ai Chi postures: performed in single knee high kneeling (water to shoulders), kneeling on LLE.  Floating, uplifting, enclosing x 5 each.  Then in lowered squat - soothing posture x 5 each direction.  Pt requires buoyancy for support and to offload joints with strengthening exercises. Viscosity of the water is needed for resistance of strengthening; water current perturbations provides challenge to standing balance unsupported.    [facility] Adult PT Treatment/Exercise - 07/31/20 0001      Self-Care   Other Self-Care Comments  reviewed self massage with ball to Lt hip flexor and posterior hip musculature (verbally); pt verbalized understanding.             PT Long Term Goals  - 07/09/20 1027      PT LONG TERM GOAL #1   Title improve tissue extensibility with patient to demonstrated increased mobility and ROM to allow more functional movement for transitional movements    Time 6    Period Weeks    Status New    Target Date 08/20/20      PT LONG TERM GOAL #2   Title Decrease pain by 25-50% allowing patient to perform functional activities with greater ease    Time 6    Period Days    Status New    Target Date 08/20/20      PT LONG TERM GOAL #3   Title Patient to demonstrate proper transitional movements for sit to lying down; lying to sitting    Time 6    Period Weeks    Status New    Target Date 08/20/20      PT LONG TERM GOAL #4   Title Independent in HEP    Time 6    Period Weeks    Status New    Target Date 08/20/20      PT LONG TERM GOAL #5   Title Improve FOTO to </= 51% limitation    Time 6    Period Weeks    Status New    Target Date 08/20/20      Additional Long Term Goals   Additional Long Term Goals Yes      PT LONG TERM GOAL #6   Title Improve functional movement and independent in aquatic exercise to transition to community water exercise program    Time 6    Period Weeks    Status New    Target Date 08/20/20                 Plan - 07/31/20 1535    Clinical Impression Statement Reviewed pt's current aquatic exercise program to tease out exercises that are aggrivating her back and hip.  Many of the exercises she demonstrated and described entail repetitive hip flexion with the resistance of the water.  Recommended she dial back the amount her LLE moves away from the body with each exercise, as it appears she has difficulty stabilizing her core to return leg to neutral.  Pt verbalized receptiveness to recommendations.  Trialed Ai Chi postures for gentle core strengthening while submerged to shoulders.  Pt reported significant reduction of LBP during these exercises.  Rehab Potential Good    PT Frequency 2x / week     PT Duration 6 weeks    PT Treatment/Interventions Patient/family education;ADLs/Self Care Home Management;Aquatic Therapy;Cryotherapy;Electrical Stimulation;Iontophoresis 4mg /ml Dexamethasone;Moist Heat;Traction;Ultrasound;Functional mobility training;Therapeutic activities;Therapeutic exercise;Balance training;Neuromuscular re-education;Manual techniques;Dry needling;Taping    PT Next Visit Plan review HEP; progress with myofacial release and deep tissue work Lt > Rt psoas/posterior hip; ball release work; joint mobs through hips; core stabilization and strengthening; continued DN as indicated; has TENS unit at home; modalities as indicated. Assess response to DN Lt anterior hip. Reminders for avoid crossing legs    PT Home Exercise Plan [place]; VHI    Consulted and Agree with Plan of Care Patient           Patient will benefit from skilled therapeutic intervention in order to improve the following deficits and impairments:  Decreased range of motion, Pain, Decreased balance, Impaired flexibility, Improper body mechanics, Decreased mobility, Decreased strength, Postural dysfunction  Visit Diagnosis: Chronic bilateral low back pain with left-sided sciatica  Other symptoms and signs involving the musculoskeletal system  Muscle weakness (generalized)     Problem List Patient Active Problem List   Diagnosis Date Noted  . DDD (degenerative disc disease), cervical 07/06/2020  . Left lumbar radiculitis 05/06/2020  . Bilateral wrist pain 05/06/2020  . Inflammatory arthritis 03/13/2020  . Osteoarthritis of left little finger 07/31/2018  . Acute bronchitis with COPD ([place]) 05/10/2018  . Impacted cerumen of left ear 03/07/2018  . History of left mastoidectomy 03/07/2018  . Tympanosclerosis, left ear 03/07/2018  . Cardiac risk counseling 09/05/2017  . Essential hypertension 09/05/2017  . Mixed hyperlipidemia 09/05/2017  . History of Bell's palsy 08/15/2017  . History of osteoporosis  08/15/2017  . History of colon polyps 08/15/2017  . Diarrhea 08/15/2017  . History of nonmelanoma skin cancer 08/15/2017  . Brain aneurysm 08/15/2017  . Bilateral hearing loss 08/15/2017  . H/O hysterectomy for benign disease 08/15/2017   Kerin Perna, PTA 07/31/20 4:01 PM  [place], [place], [address] Phone: [phone]   Fax:  [phone]  Name: Aidan Caloca MRN: [id number] Date of Birth: 02-23-1951

## 2020-08-04 ENCOUNTER — Ambulatory Visit (INDEPENDENT_AMBULATORY_CARE_PROVIDER_SITE_OTHER): Payer: Medicare HMO | Admitting: Rehabilitative and Restorative Service Providers"

## 2020-08-04 ENCOUNTER — Encounter: Payer: Self-pay | Admitting: Rehabilitative and Restorative Service Providers"

## 2020-08-04 ENCOUNTER — Other Ambulatory Visit: Payer: Self-pay

## 2020-08-04 DIAGNOSIS — G8929 Other chronic pain: Secondary | ICD-10-CM | POA: Diagnosis not present

## 2020-08-04 DIAGNOSIS — M545 Low back pain, unspecified: Secondary | ICD-10-CM

## 2020-08-04 DIAGNOSIS — M6281 Muscle weakness (generalized): Secondary | ICD-10-CM | POA: Diagnosis not present

## 2020-08-04 DIAGNOSIS — R29898 Other symptoms and signs involving the musculoskeletal system: Secondary | ICD-10-CM | POA: Diagnosis not present

## 2020-08-04 DIAGNOSIS — M5442 Lumbago with sciatica, left side: Secondary | ICD-10-CM

## 2020-08-04 NOTE — Therapy (Signed)
[place] [address] [place], [place], [address] Phone: [phone]   Fax:  [phone]  Physical Therapy Treatment  Patient Details  Name: Krista [name] MRN: [id number] Date of Birth: 11-11-1951 Referring Provider (PT): Dr Sophronia Simas    Encounter Date: 08/04/2020   PT End of Session - 08/04/20 1011    Visit Number 8    Number of Visits 12    Date for PT Re-Evaluation 08/20/20    PT Start Time 1011    PT Stop Time 1100    PT Time Calculation (min) 49 min    Activity Tolerance Patient tolerated treatment well           Past Medical History:  Diagnosis Date  . Arthritis   . Bell palsy   . Brain aneurysm   . Hyperlipidemia   . Hypertension   . Osteoporosis     Past Surgical History:  Procedure Laterality Date  . ABDOMINAL HYSTERECTOMY    . CEREBRAL ANEURYSM REPAIR    . EYE SURGERY     cararact  . FOOT SURGERY    . HAND SURGERY    . MASTOIDECTOMY Left     There were no vitals filed for this visit.   Subjective Assessment - 08/04/20 1013    Subjective Patient reports that she has modified her water exercises and feels that has helped. She has not had pain in the Lt anterior hip/groin. She still has some pain in the Lt posterior back to upper part of the hip.    Currently in Pain? Yes    Pain Score 4     Pain Location Back    Pain Orientation Left    Pain Descriptors / Indicators Nagging;Throbbing    Pain Type Acute pain    Pain Onset More than a month ago    Pain Frequency Intermittent    Aggravating Factors  bending; modified leg lifts in the water    Pain Relieving Factors TENS ice heat              [facility] PT Assessment - 08/04/20 0001      Assessment   Medical Diagnosis Lumbar radiculitis Lt > Rt     Referring Provider (PT) Dr Sophronia Simas     Onset Date/Surgical Date 07/05/20   worse in the past 1-2 months    Hand Dominance Right    Next MD Visit after 6 weeks of PT     Prior  Therapy yes here for LBP       Flexibility   Piriformis tigth Lt > Rt       Palpation   Spinal mobility hypomobile and less painful with CPA mobs lumbar spine     Palpation comment improving muscular tightness through Lt >> RT psoas; abs; posterior hip through the piriformis/gluts                          [facility] Adult PT Treatment/Exercise - 08/04/20 0001      Lumbar Exercises: Stretches   Piriformis Stretch Left;2 reps;30 seconds    Piriformis Stretch Limitations supine travell    Other Lumbar Stretch Exercise hip adductor stretch - butterfly 10 sec x 2 reps       Lumbar Exercises: Aerobic   Recumbent Bike L5 x 5 min UE (10)       Lumbar Exercises: Standing   Other Standing Lumbar Exercises hip abdcution leading with heel, core engaged, short range 3  sec hold x 10 reps x 2 sets     Other Standing Lumbar Exercises hip extension knee straight core engaged 10 reps x 2 sets       Lumbar Exercises: Supine   Clam 10 reps;3 seconds    Clam Limitations alternating LE's green TB     Bridge 5 reps;5 seconds      Moist Heat Therapy   Number Minutes Moist Heat 10 Minutes    Moist Heat Location Lumbar Spine;Hip   anterior/posterior Lt hip      Manual Therapy   Manual therapy comments skilled palpation for assessment of response to DN/manual work     Soft tissue mobilization prone working through the Lt posterior  lumbar spine and piriformis/gluts     Myofascial Release Lt posterior hip             Trigger Point Dry Needling - 08/04/20 0001    Consent Given? Yes    Education Handout Provided Previously provided    Dry Needling Comments Lt     Gluteus Medius Response Palpable increased muscle length    Gluteus Maximus Response Palpable increased muscle length    Piriformis Response Palpable increased muscle length                PT Education - 08/04/20 1032    Education Details HEP    Person(s) Educated Patient    Methods Explanation;Demonstration;Tactile  cues;Verbal cues;Handout    Comprehension Verbalized understanding;Returned demonstration;Verbal cues required;Tactile cues required               PT Long Term Goals - 07/09/20 1027      PT LONG TERM GOAL #1   Title improve tissue extensibility with patient to demonstrated increased mobility and ROM to allow more functional movement for transitional movements    Time 6    Period Weeks    Status New    Target Date 08/20/20      PT LONG TERM GOAL #2   Title Decrease pain by 25-50% allowing patient to perform functional activities with greater ease    Time 6    Period Days    Status New    Target Date 08/20/20      PT LONG TERM GOAL #3   Title Patient to demonstrate proper transitional movements for sit to lying down; lying to sitting    Time 6    Period Weeks    Status New    Target Date 08/20/20      PT LONG TERM GOAL #4   Title Independent in HEP    Time 6    Period Weeks    Status New    Target Date 08/20/20      PT LONG TERM GOAL #5   Title Improve FOTO to </= 51% limitation    Time 6    Period Weeks    Status New    Target Date 08/20/20      Additional Long Term Goals   Additional Long Term Goals Yes      PT LONG TERM GOAL #6   Title Improve functional movement and independent in aquatic exercise to transition to community water exercise program    Time 6    Period Weeks    Status New    Target Date 08/20/20                 Plan - 08/04/20 1012    Clinical Impression Statement Positive response to modifications suggested in  aquatic therapy session with pt reporting significant improvement in anterior hip pain. She has some persistent pain in the posterior hip and Lt lower lumbar area. Good response to DN and manual work in that area today. Patient is modifying home tasks as well as exercise program. Progressing well toward stated goals of therapy.    Rehab Potential Good    PT Frequency 2x / week    PT Duration 6 weeks    PT  Treatment/Interventions Patient/family education;ADLs/Self Care Home Management;Aquatic Therapy;Cryotherapy;Electrical Stimulation;Iontophoresis 4mg /ml Dexamethasone;Moist Heat;Traction;Ultrasound;Functional mobility training;Therapeutic activities;Therapeutic exercise;Balance training;Neuromuscular re-education;Manual techniques;Dry needling;Taping    PT Next Visit Plan review HEP; progress with myofacial release and deep tissue work Lt > Rt psoas/posterior hip; ball release work; joint mobs through hips; core stabilization and strengthening; continued DN as indicated; has TENS unit at home; modalities as indicated. Assess response to DN Lt anterior hip. Reminders for avoid crossing legs    PT Home Exercise Plan [place]; [facility]    Consulted and Agree with Plan of Care Patient           Patient will benefit from skilled therapeutic intervention in order to improve the following deficits and impairments:     Visit Diagnosis: Chronic bilateral low back pain with left-sided sciatica  Other symptoms and signs involving the musculoskeletal system  Muscle weakness (generalized)  Acute bilateral low back pain without sciatica     Problem List Patient Active Problem List   Diagnosis Date Noted  . DDD (degenerative disc disease), cervical 07/06/2020  . Left lumbar radiculitis 05/06/2020  . Bilateral wrist pain 05/06/2020  . Inflammatory arthritis 03/13/2020  . Osteoarthritis of left little finger 07/31/2018  . Acute bronchitis with COPD ([place]) 05/10/2018  . Impacted cerumen of left ear 03/07/2018  . History of left mastoidectomy 03/07/2018  . Tympanosclerosis, left ear 03/07/2018  . Cardiac risk counseling 09/05/2017  . Essential hypertension 09/05/2017  . Mixed hyperlipidemia 09/05/2017  . History of Bell's palsy 08/15/2017  . History of osteoporosis 08/15/2017  . History of colon polyps 08/15/2017  . Diarrhea 08/15/2017  . History of nonmelanoma skin cancer 08/15/2017  . Brain  aneurysm 08/15/2017  . Bilateral hearing loss 08/15/2017  . H/O hysterectomy for benign disease 08/15/2017    Vernice Bowker Nilda Simmer PT, MPH  08/04/2020, 11:02 AM  [facility] [place], [place], [address] Phone: [phone]   Fax:  [phone]  Name: Krista [name] MRN: [id number] Date of Birth: 24-Mar-1951

## 2020-08-04 NOTE — Patient Instructions (Signed)
Access Code: [email or web address]: [email or web address]: 08/31/2021Prepared by: Celyn HoltExercises  Prone Press Up on Elbows - 2 x daily - 7 x weekly - 1 sets - 3 reps - 30 sec hold  Prone Quadriceps Stretch with Strap - 2 x daily - 7 x weekly - 1 sets - 3 reps - 30 sec hold  Gastroc Stretch on Wall - 2 x daily - 7 x weekly - 1 sets - 3 reps - 30 sec hold  Doorway Pec Stretch at 60 Degrees Abduction - 3 x daily - 7 x weekly - 3 reps - 1 sets  Doorway Pec Stretch at 90 Degrees Abduction - 3 x daily - 7 x weekly - 3 reps - 1 sets - 30 seconds hold  Doorway Pec Stretch at 120 Degrees Abduction - 3 x daily - 7 x weekly - 3 reps - 1 sets - 30 second hold hold  Seated Hamstring Stretch - 2 x daily - 7 x weekly - 1 sets - 3 reps - 30 sec hold  Hooklying Hamstring Stretch with Strap - 2 x daily - 7 x weekly - 1 sets - 3 reps - 30 sec hold  Supine ITB Stretch with Strap - 2 x daily - 7 x weekly - 1 sets - 3 reps - 30 sec hold  Supine Piriformis Stretch with Leg Straight - 2 x daily - 7 x weekly - 1 sets - 3 reps - 30 sec hold  Standing Hip Abduction with Counter Support - 2 x daily - 7 x weekly - 2-3 sets - 10 reps - 2-3 sec hold  Standing Hip Extension with Counter Support - 2 x daily - 7 x weekly - 1-2 sets - 10 reps - 3-5 sec hold

## 2020-08-06 ENCOUNTER — Other Ambulatory Visit: Payer: Self-pay | Admitting: Osteopathic Medicine

## 2020-08-07 ENCOUNTER — Encounter: Payer: Self-pay | Admitting: Physical Therapy

## 2020-08-07 ENCOUNTER — Other Ambulatory Visit: Payer: Self-pay

## 2020-08-07 ENCOUNTER — Ambulatory Visit (INDEPENDENT_AMBULATORY_CARE_PROVIDER_SITE_OTHER): Payer: Medicare HMO | Admitting: Physical Therapy

## 2020-08-07 DIAGNOSIS — G8929 Other chronic pain: Secondary | ICD-10-CM | POA: Diagnosis not present

## 2020-08-07 DIAGNOSIS — M5442 Lumbago with sciatica, left side: Secondary | ICD-10-CM | POA: Diagnosis not present

## 2020-08-07 DIAGNOSIS — R29898 Other symptoms and signs involving the musculoskeletal system: Secondary | ICD-10-CM | POA: Diagnosis not present

## 2020-08-07 DIAGNOSIS — M6281 Muscle weakness (generalized): Secondary | ICD-10-CM

## 2020-08-07 NOTE — Therapy (Signed)
[place], [place], [address] Phone: [phone]   Fax:  [phone]  Physical Therapy Treatment  Patient Details  Name: Krista [name] MRN: [id number] Date of Birth: June 06, 1951 Referring Provider (PT): Dr Sophronia Simas    Encounter Date: 08/07/2020   PT End of Session - 08/07/20 1709    Visit Number 9    Number of Visits 12    Date for PT Re-Evaluation 08/20/20    PT Start Time 3329    PT Stop Time 1400    PT Time Calculation (min) 43 min    Activity Tolerance Patient tolerated treatment well    Behavior During Therapy [facility] for tasks assessed/performed           Past Medical History:  Diagnosis Date  . Arthritis   . Bell palsy   . Brain aneurysm   . Hyperlipidemia   . Hypertension   . Osteoporosis     Past Surgical History:  Procedure Laterality Date  . ABDOMINAL HYSTERECTOMY    . CEREBRAL ANEURYSM REPAIR    . EYE SURGERY     cararact  . FOOT SURGERY    . HAND SURGERY    . MASTOIDECTOMY Left     There were no vitals filed for this visit.   Subjective Assessment - 08/07/20 1702    Subjective "Had to really work on (modifying) exercises", she states she didn't have as much pain in hip, no Charlie horses in leg with modifying exercises.    Pertinent History Chronic LBP; neck pain ; Bell's palsy; HTN; arthritis; osteoporesis; bilat feet and hand surgeries; Rt RCR; aneurysm surgery; ear surgery    Currently in Pain? Yes    Pain Score 4     Pain Location Back    Pain Orientation Left;Lower    Pain Descriptors / Indicators Aching    Pain Onset More than a month ago           Pt seen for aquatic therapy today.  Treatment took place in water 3-3.75 ft in depth at [facility]. Temp of water was 87.  Pt entered/exited the pool via ramp with bilat rail independently.  Treatment:   Forward walking / Retro walking 50 meters each.  Side stepping Rt/Lt 60 meters with cues  to decrease Lt step length and engage core.  Ai Chi postures:  5 reps of each - Floating, uplifting, enclosing, folding, soothing, gathering, freeing  (3 sets with heavy cues for technique).  10 each of accepting, accepting with grace (difficulty with coordination of movement), rounding (with cues to flex knee and decrease forward flexion), balancing.   First 5 postures performed in high kneeling position kneeling on Lt knee, other postures performed in open squat while submerged to shoulders.   Pt requires buoyancy for support and to offload joints with strengthening exercises. Viscosity of the water is needed for resistance of strengthening; water current perturbations provides challenge to standing balance unsupported, requiring increased core activation.      PT Long Term Goals - 07/09/20 1027      PT LONG TERM GOAL #1   Title improve tissue extensibility with patient to demonstrated increased mobility and ROM to allow more functional movement for transitional movements    Time 6    Period Weeks    Status New    Target Date 08/20/20      PT LONG TERM GOAL #2   Title Decrease pain by 25-50% allowing patient to perform  functional activities with greater ease    Time 6    Period Days    Status New    Target Date 08/20/20      PT LONG TERM GOAL #3   Title Patient to demonstrate proper transitional movements for sit to lying down; lying to sitting    Time 6    Period Weeks    Status New    Target Date 08/20/20      PT LONG TERM GOAL #4   Title Independent in HEP    Time 6    Period Weeks    Status New    Target Date 08/20/20      PT LONG TERM GOAL #5   Title Improve FOTO to </= 51% limitation    Time 6    Period Weeks    Status New    Target Date 08/20/20      Additional Long Term Goals   Additional Long Term Goals Yes      PT LONG TERM GOAL #6   Title Improve functional movement and independent in aquatic exercise to transition to community water exercise program     Time 6    Period Weeks    Status New    Target Date 08/20/20                 Plan - 08/07/20 1704    Clinical Impression Statement Pt reporting improvement in symptoms with modification of intensity of exercises in her water classes.  Pt shown Ai Chi postures with core support; all tolerated without increase in symptoms.  She reported reduction of pain in Lt low back and ant hip after completing dynamic Lt hip ext stretch in pool.  Pt verbalized understanding of modification of exercises to control / eliminate increased LBP.  Progressing well towards established therapy goals.    Rehab Potential Good    PT Frequency 2x / week    PT Duration 6 weeks    PT Treatment/Interventions Patient/family education;ADLs/Self Care Home Management;Aquatic Therapy;Cryotherapy;Electrical Stimulation;Iontophoresis 4mg /ml Dexamethasone;Moist Heat;Traction;Ultrasound;Functional mobility training;Therapeutic activities;Therapeutic exercise;Balance training;Neuromuscular re-education;Manual techniques;Dry needling;Taping    PT Next Visit Plan 10th visit.  core strengthening; manual work to [employer] hip/ low back    PT [place]; VHI    Consulted and Agree with Plan of Care Patient           Patient will benefit from skilled therapeutic intervention in order to improve the following deficits and impairments:  Decreased range of motion, Pain, Decreased balance, Impaired flexibility, Improper body mechanics, Decreased mobility, Decreased strength, Postural dysfunction  Visit Diagnosis: Chronic bilateral low back pain with left-sided sciatica  Other symptoms and signs involving the musculoskeletal system  Muscle weakness (generalized)     Problem List Patient Active Problem List   Diagnosis Date Noted  . DDD (degenerative disc disease), cervical 07/06/2020  . Left lumbar radiculitis 05/06/2020  . Bilateral wrist pain 05/06/2020  . Inflammatory arthritis 03/13/2020  . Osteoarthritis  of left little finger 07/31/2018  . Acute bronchitis with COPD ([place]) 05/10/2018  . Impacted cerumen of left ear 03/07/2018  . History of left mastoidectomy 03/07/2018  . Tympanosclerosis, left ear 03/07/2018  . Cardiac risk counseling 09/05/2017  . Essential hypertension 09/05/2017  . Mixed hyperlipidemia 09/05/2017  . History of Bell's palsy 08/15/2017  . History of osteoporosis 08/15/2017  . History of colon polyps 08/15/2017  . Diarrhea 08/15/2017  . History of nonmelanoma skin cancer 08/15/2017  . Brain aneurysm 08/15/2017  .  Bilateral hearing loss 08/15/2017  . H/O hysterectomy for benign disease 08/15/2017    Shelbie Hutching 08/07/2020, 5:12 PM  [facility] [place], [place], [address] Phone: [phone]   Fax:  [phone]  Name: Ivi Griffith MRN: [id number] Date of Birth: 12/30/1950

## 2020-08-11 ENCOUNTER — Encounter: Payer: Self-pay | Admitting: Rehabilitative and Restorative Service Providers"

## 2020-08-11 ENCOUNTER — Other Ambulatory Visit: Payer: Self-pay

## 2020-08-11 ENCOUNTER — Ambulatory Visit (INDEPENDENT_AMBULATORY_CARE_PROVIDER_SITE_OTHER): Payer: Medicare HMO | Admitting: Rehabilitative and Restorative Service Providers"

## 2020-08-11 DIAGNOSIS — M5442 Lumbago with sciatica, left side: Secondary | ICD-10-CM

## 2020-08-11 DIAGNOSIS — M545 Low back pain, unspecified: Secondary | ICD-10-CM

## 2020-08-11 DIAGNOSIS — M6281 Muscle weakness (generalized): Secondary | ICD-10-CM | POA: Diagnosis not present

## 2020-08-11 DIAGNOSIS — R29898 Other symptoms and signs involving the musculoskeletal system: Secondary | ICD-10-CM

## 2020-08-11 DIAGNOSIS — G8929 Other chronic pain: Secondary | ICD-10-CM

## 2020-08-11 NOTE — Therapy (Signed)
[place], [place], [address] Phone: [phone]   Fax:  [phone]  Physical Therapy Treatment Progress Note Reporting Period 07/09/20 to 08/11/20  See note below for Objective Data and Assessment of Progress/Goals.       Patient Details  Name: Krista [name] MRN: [id number] Date of Birth: Mar 15, 1951 Referring Provider (PT): Dr Sophronia Simas    Encounter Date: 08/11/2020   PT End of Session - 08/11/20 1019    Visit Number 10    Number of Visits 12    Date for PT Re-Evaluation 08/20/20    PT Start Time 1013    PT Stop Time 1102    PT Time Calculation (min) 49 min    Activity Tolerance Patient tolerated treatment well           Past Medical History:  Diagnosis Date  . Arthritis   . Bell palsy   . Brain aneurysm   . Hyperlipidemia   . Hypertension   . Osteoporosis     Past Surgical History:  Procedure Laterality Date  . ABDOMINAL HYSTERECTOMY    . CEREBRAL ANEURYSM REPAIR    . EYE SURGERY     cararact  . FOOT SURGERY    . HAND SURGERY    . MASTOIDECTOMY Left     There were no vitals filed for this visit.   Subjective Assessment - 08/11/20 1019    Subjective Patient reports increased Lt LBP last night and this morning. She is working on exercises at home and avoiding crossing her legs. overal some improvement. Still has Lt LBP and Lt LE pain at times.    Currently in Pain? Yes    Pain Location Back    Pain Orientation Left;Lower    Pain Descriptors / Indicators Aching    Pain Type Acute pain;Chronic pain    Pain Radiating Towards no longer into calf - at times still into Lt lateral thigh    Pain Onset More than a month ago    Pain Frequency Intermittent    Aggravating Factors  bending; prolonged standing    Pain Relieving Factors TENS heat              [facility] PT Assessment - 08/11/20 0001      Assessment   Medical Diagnosis Lumbar radiculitis Lt > Rt     Referring  Provider (PT) Dr Sophronia Simas     Onset Date/Surgical Date 07/05/20   worse in the past 1-2 months    Hand Dominance Right    Next MD Visit after 6 weeks of PT     Prior Therapy yes here for LBP       Observation/Other Assessments   Focus on Therapeutic Outcomes (FOTO)  48% limtation       AROM   Lumbar Flexion 70% painful lt    Lumbar Extension 30% painful Lt     Lumbar - Right Side Bend 55% painful Lt     Lumbar - Left Side Bend 50% painful Lt     Lumbar - Right Rotation 25%     Lumbar - Left Rotation 25%       Strength   Right Hip Flexion 5/5    Right Hip Extension 5/5    Right Hip ABduction 5/5    Left Hip Flexion 4+/5    Left Hip Extension --   5-/5   Left Hip ABduction 5/5      Flexibility   Quadriceps tight and painful Lt >  Rt     ITB tight Lt > Rt     Piriformis tigth Lt > Rt       Palpation   Spinal mobility hypomobile and less painful with CPA mobs lumbar spine     Palpation comment improving muscular tightness through Lt >> RT psoas; abs; posterior hip through the piriformis/gluts                                       PT Long Term Goals - 08/11/20 1034      PT LONG TERM GOAL #1   Title improve tissue extensibility with patient to demonstrated increased mobility and ROM to allow more functional movement for transitional movements    Time 6    Period Weeks    Status Partially Met      PT LONG TERM GOAL #2   Title Decrease pain by 25-50% allowing patient to perform functional activities with greater ease    Time 6    Period Weeks    Status Partially Met      PT LONG TERM GOAL #3   Title Patient to demonstrate proper transitional movements for sit to lying down; lying to sitting    Time 6    Period Weeks    Status Achieved      PT LONG TERM GOAL #4   Title Independent in HEP    Time 6    Period Weeks    Status Partially Met      PT LONG TERM GOAL #5   Title Improve FOTO to </= 51% limitation    Time 6    Status  Achieved                 Plan - 08/11/20 1036    Clinical Impression Statement Patient reports flare of symptoms yesterday and this morning. She was up more standing Sunday for several hours. Not sure of anything she did different yesterday. Did not stretch. Goals of therapy are partially accomplished. Patient has tolerated DN better than initial trials. She reports improvement with DN. Reinforced importance of stretching  Patient will benefit from continued treatment to accomplishe goals of therapy,    Rehab Potential Good    PT Frequency 2x / week    PT Duration 6 weeks    PT Treatment/Interventions Patient/family education;ADLs/Self Care Home Management;Aquatic Therapy;Cryotherapy;Electrical Stimulation;Iontophoresis 9m/ml Dexamethasone;Moist Heat;Traction;Ultrasound;Functional mobility training;Therapeutic activities;Therapeutic exercise;Balance training;Neuromuscular re-education;Manual techniques;Dry needling;Taping    PT Next Visit Plan 10th visit.  core strengthening; manual work to [employer]hip/ low back    PT [facility] [facility]    Consulted and Agree with Plan of Care Patient           Patient will benefit from skilled therapeutic intervention in order to improve the following deficits and impairments:     Visit Diagnosis: Chronic bilateral low back pain with left-sided sciatica  Other symptoms and signs involving the musculoskeletal system  Muscle weakness (generalized)  Acute bilateral low back pain without sciatica     Problem List Patient Active Problem List   Diagnosis Date Noted  . DDD (degenerative disc disease), cervical 07/06/2020  . Left lumbar radiculitis 05/06/2020  . Bilateral wrist pain 05/06/2020  . Inflammatory arthritis 03/13/2020  . Osteoarthritis of left little finger 07/31/2018  . Acute bronchitis with COPD ([facility] 05/10/2018  . Impacted cerumen of left ear 03/07/2018  . History of  left mastoidectomy 03/07/2018  .  Tympanosclerosis, left ear 03/07/2018  . Cardiac risk counseling 09/05/2017  . Essential hypertension 09/05/2017  . Mixed hyperlipidemia 09/05/2017  . History of Bell's palsy 08/15/2017  . History of osteoporosis 08/15/2017  . History of colon polyps 08/15/2017  . Diarrhea 08/15/2017  . History of nonmelanoma skin cancer 08/15/2017  . Brain aneurysm 08/15/2017  . Bilateral hearing loss 08/15/2017  . H/O hysterectomy for benign disease 08/15/2017    Ambria Mayfield Nilda Simmer PT, MPH  08/11/2020, 10:55 AM  [facility] [place], [place], [address] Phone: [phone]   Fax:  [phone]  Name: Krista [name] MRN: [id number] Date of Birth: 1951/04/07

## 2020-08-14 ENCOUNTER — Ambulatory Visit: Payer: Medicare HMO | Admitting: Physical Therapy

## 2020-08-17 DIAGNOSIS — R112 Nausea with vomiting, unspecified: Secondary | ICD-10-CM | POA: Diagnosis not present

## 2020-08-17 DIAGNOSIS — K52832 Lymphocytic colitis: Secondary | ICD-10-CM | POA: Diagnosis not present

## 2020-08-18 ENCOUNTER — Other Ambulatory Visit: Payer: Self-pay

## 2020-08-18 ENCOUNTER — Encounter: Payer: Self-pay | Admitting: Physical Therapy

## 2020-08-18 ENCOUNTER — Ambulatory Visit (INDEPENDENT_AMBULATORY_CARE_PROVIDER_SITE_OTHER): Payer: Medicare HMO | Admitting: Physical Therapy

## 2020-08-18 DIAGNOSIS — M5442 Lumbago with sciatica, left side: Secondary | ICD-10-CM | POA: Diagnosis not present

## 2020-08-18 DIAGNOSIS — G8929 Other chronic pain: Secondary | ICD-10-CM

## 2020-08-18 DIAGNOSIS — R29898 Other symptoms and signs involving the musculoskeletal system: Secondary | ICD-10-CM

## 2020-08-18 DIAGNOSIS — M6281 Muscle weakness (generalized): Secondary | ICD-10-CM

## 2020-08-18 NOTE — Therapy (Signed)
[place], [place], [address] Phone: [phone]   Fax:  [phone]  Physical Therapy Treatment  Patient Details  Name: Krista [name] MRN: [id number] Date of Birth: [birth year] Referring Provider (PT): Dr Sophronia Simas    Encounter Date: 08/18/2020   PT End of Session - 08/18/20 1022    Visit Number 11    Number of Visits 12    Date for PT Re-Evaluation 08/20/20    PT Start Time 1022    PT Stop Time 1111   10 min heat at end   PT Time Calculation (min) 49 min    Activity Tolerance Patient tolerated treatment well    Behavior During Therapy [facility] for tasks assessed/performed           Past Medical History:  Diagnosis Date  . Arthritis   . Bell palsy   . Brain aneurysm   . Hyperlipidemia   . Hypertension   . Osteoporosis     Past Surgical History:  Procedure Laterality Date  . ABDOMINAL HYSTERECTOMY    . CEREBRAL ANEURYSM REPAIR    . EYE SURGERY     cararact  . FOOT SURGERY    . HAND SURGERY    . MASTOIDECTOMY Left     There were no vitals filed for this visit.   Subjective Assessment - 08/18/20 1022    Subjective Feeling it in LB and out into hip a little today. More pain at night when I lay down.    Pertinent History Chronic LBP; neck pain ; Bell's palsy; HTN; arthritis; osteoporesis; bilat feet and hand surgeries; Rt RCR; aneurysm surgery; ear surgery    Currently in Pain? Yes    Pain Score 4     Pain Location Back    Pain Orientation Left    Pain Descriptors / Indicators Aching                             [facility] Adult PT Treatment/Exercise - 08/18/20 0001      Lumbar Exercises: Stretches   Standing Side Bend Right;Left;20 seconds;4 reps    Standing Side Bend Limitations different options; best was doorway with left left leg back and behind right     ITB Stretch Left;3 reps;20 seconds    ITB Stretch Limitations attempted multiple positions supine , SDLY  standing    Piriformis Stretch Left;2 reps;60 seconds    Piriformis Stretch Limitations seated    Other Lumbar Stretch Exercise hip adductor stretch - butterfly 10 sec x 2 reps       Lumbar Exercises: Aerobic   Recumbent Bike L2 x 5 min4      Manual Therapy   Manual therapy comments skilled palpation for assessment of response to DN/manual work     Soft tissue mobilization gently to left lumbar and QL; pt could not tolerate gluteals today            Trigger Point Dry Needling - 08/18/20 0001    Consent Given? Yes    Education Handout Provided Previously provided    Muscles Treated Back/Hip Gluteus minimus    Dry Needling Comments Lt    Gluteus Minimus Response Twitch response elicited;Palpable increased muscle length    Gluteus Medius Response Twitch response elicited;Palpable increased muscle length    Gluteus Maximus Response Twitch response elicited;Palpable increased muscle length    Piriformis Response Palpable increased muscle length  PT Education - 08/18/20 1104    Education Details HEP    Person(s) Educated Patient    Methods Explanation;Demonstration;Handout    Comprehension Verbalized understanding;Returned demonstration               PT Long Term Goals - 08/11/20 1034      PT LONG TERM GOAL #1   Title improve tissue extensibility with patient to demonstrated increased mobility and ROM to allow more functional movement for transitional movements    Time 6    Period Weeks    Status Partially Met      PT LONG TERM GOAL #2   Title Decrease pain by 25-50% allowing patient to perform functional activities with greater ease    Time 6    Period Weeks    Status Partially Met      PT LONG TERM GOAL #3   Title Patient to demonstrate proper transitional movements for sit to lying down; lying to sitting    Time 6    Period Weeks    Status Achieved      PT LONG TERM GOAL #4   Title Independent in HEP    Time 6    Period Weeks    Status  Partially Met      PT LONG TERM GOAL #5   Title Improve FOTO to </= 51% limitation    Time 6    Status Achieved                 Plan - 08/18/20 1121    Clinical Impression Statement Patient reporting pain in left low back and superior gluteals today. She reports standing for 3 hours cooking on Sunday and had increased pain. We spent time finding a good ITB stretch as she is feeling supine with strap in the hamstrings. The best is SDLY quad stretch with adduction, however pt is unable to get into this position without PT assist due to tight quads. She could feel some stretch in ITB with standing QL stretch and really felt it in the low back so this was issued for HEP. She did not tolerate DN or manual very well today. Very sensitive to light touch. Patien has one more visit and then returns to MD.    PT Frequency 2x / week    PT Duration 6 weeks    PT Treatment/Interventions Patient/family education;ADLs/Self Care Home Management;Aquatic Therapy;Cryotherapy;Electrical Stimulation;Iontophoresis 27m/ml Dexamethasone;Moist Heat;Traction;Ultrasound;Functional mobility training;Therapeutic activities;Therapeutic exercise;Balance training;Neuromuscular re-education;Manual techniques;Dry needling;Taping    PT Next Visit Plan MD note; core strengthening; manual work to [employer]/ low back    PT [facility] [place] [facility]    Consulted and Agree with Plan of Care Patient           Patient will benefit from skilled therapeutic intervention in order to improve the following deficits and impairments:  Decreased range of motion, Pain, Decreased balance, Impaired flexibility, Improper body mechanics, Decreased mobility, Decreased strength, Postural dysfunction  Visit Diagnosis: Chronic bilateral low back pain with left-sided sciatica  Other symptoms and signs involving the musculoskeletal system  Muscle weakness (generalized)     Problem List Patient Active Problem List   Diagnosis Date  Noted  . DDD (degenerative disc disease), cervical 07/06/2020  . Left lumbar radiculitis 05/06/2020  . Bilateral wrist pain 05/06/2020  . Inflammatory arthritis 03/13/2020  . Osteoarthritis of left little finger 07/31/2018  . Acute bronchitis with COPD ([facility] 05/10/2018  . Impacted cerumen of left ear 03/07/2018  . History of  left mastoidectomy 03/07/2018  . Tympanosclerosis, left ear 03/07/2018  . Cardiac risk counseling 09/05/2017  . Essential hypertension 09/05/2017  . Mixed hyperlipidemia 09/05/2017  . History of Bell's palsy 08/15/2017  . History of osteoporosis 08/15/2017  . History of colon polyps 08/15/2017  . Diarrhea 08/15/2017  . History of nonmelanoma skin cancer 08/15/2017  . Brain aneurysm 08/15/2017  . Bilateral hearing loss 08/15/2017  . H/O hysterectomy for benign disease 08/15/2017    Madelyn Flavors PT 08/18/2020, 11:31 AM  [facility] [place], [place], [address] Phone: [phone]   Fax:  [phone]  Name: Krista [name] MRN: [id number] Date of Birth: Apr 15, 1951

## 2020-08-18 NOTE — Patient Instructions (Signed)
Access Code: [email or web address] URL: [email or web address] Date: 08/18/2020 Prepared by: Almyra Free  Exercises Prone Press Up on Elbows - 2 x daily - 7 x weekly - 1 sets - 3 reps - 30 sec hold Prone Quadriceps Stretch with Strap - 2 x daily - 7 x weekly - 1 sets - 3 reps - 30 sec hold Gastroc Stretch on Wall - 2 x daily - 7 x weekly - 1 sets - 3 reps - 30 sec hold Doorway Pec Stretch at 60 Degrees Abduction - 3 x daily - 7 x weekly - 3 reps - 1 sets Doorway Pec Stretch at 90 Degrees Abduction - 3 x daily - 7 x weekly - 3 reps - 1 sets - 30 seconds hold Doorway Pec Stretch at 120 Degrees Abduction - 3 x daily - 7 x weekly - 3 reps - 1 sets - 30 second hold hold Seated Hamstring Stretch - 2 x daily - 7 x weekly - 1 sets - 3 reps - 30 sec hold Hooklying Hamstring Stretch with Strap - 2 x daily - 7 x weekly - 1 sets - 3 reps - 30 sec hold Supine ITB Stretch with Strap - 2 x daily - 7 x weekly - 1 sets - 3 reps - 30 sec hold Supine Piriformis Stretch with Leg Straight - 2 x daily - 7 x weekly - 1 sets - 3 reps - 30 sec hold Standing Hip Abduction with Counter Support - 2 x daily - 7 x weekly - 2-3 sets - 10 reps - 2-3 sec hold Standing Hip Extension with Counter Support - 2 x daily - 7 x weekly - 1-2 sets - 10 reps - 3-5 sec hold Standing Quadratus Lumborum Stretch with Doorway - 2 x daily - 7 x weekly - 2 reps - 1 sets - 60 sec hold

## 2020-08-21 ENCOUNTER — Ambulatory Visit: Payer: Medicare HMO | Admitting: Physical Therapy

## 2020-08-25 ENCOUNTER — Encounter: Payer: Self-pay | Admitting: Rehabilitative and Restorative Service Providers"

## 2020-08-25 ENCOUNTER — Other Ambulatory Visit: Payer: Self-pay

## 2020-08-25 ENCOUNTER — Ambulatory Visit (INDEPENDENT_AMBULATORY_CARE_PROVIDER_SITE_OTHER): Payer: Medicare HMO | Admitting: Rehabilitative and Restorative Service Providers"

## 2020-08-25 DIAGNOSIS — G8929 Other chronic pain: Secondary | ICD-10-CM | POA: Diagnosis not present

## 2020-08-25 DIAGNOSIS — M545 Low back pain, unspecified: Secondary | ICD-10-CM

## 2020-08-25 DIAGNOSIS — R29898 Other symptoms and signs involving the musculoskeletal system: Secondary | ICD-10-CM | POA: Diagnosis not present

## 2020-08-25 DIAGNOSIS — M6281 Muscle weakness (generalized): Secondary | ICD-10-CM | POA: Diagnosis not present

## 2020-08-25 DIAGNOSIS — M5442 Lumbago with sciatica, left side: Secondary | ICD-10-CM

## 2020-08-25 DIAGNOSIS — H524 Presbyopia: Secondary | ICD-10-CM | POA: Diagnosis not present

## 2020-08-25 DIAGNOSIS — H353131 Nonexudative age-related macular degeneration, bilateral, early dry stage: Secondary | ICD-10-CM | POA: Diagnosis not present

## 2020-08-25 NOTE — Therapy (Addendum)
[place], [place], [address] Phone: [phone]   Fax:  [phone]  Physical Therapy Treatment  Patient Details  Name: Krista [name] MRN: [id number] Date of Birth: 1951/03/24 Referring Provider (PT): Dr Sophronia Simas    Encounter Date: 08/25/2020   PT End of Session - 08/25/20 1023    Visit Number 12    Number of Visits 12    Date for PT Re-Evaluation 08/20/20    PT Start Time 1018    PT Stop Time 1100    PT Time Calculation (min) 42 min    Activity Tolerance Patient tolerated treatment well           Past Medical History:  Diagnosis Date  . Arthritis   . Bell palsy   . Brain aneurysm   . Hyperlipidemia   . Hypertension   . Osteoporosis     Past Surgical History:  Procedure Laterality Date  . ABDOMINAL HYSTERECTOMY    . CEREBRAL ANEURYSM REPAIR    . EYE SURGERY     cararact  . FOOT SURGERY    . HAND SURGERY    . MASTOIDECTOMY Left     There were no vitals filed for this visit.   Subjective Assessment - 08/25/20 1024    Subjective Overall LB and Lt hip/LE pain is less intense and she no longer having pain in the the Lt thigh or leg. Charlie horses have gone away. LB and hip hurt following last visit with DN. Awoke Saturday with Rt shoulder pain with no known injury or irritation.    Currently in Pain? Yes    Pain Score 4     Pain Location Back    Pain Orientation Left;Lower    Pain Descriptors / Indicators Aching    Pain Type Acute pain;Chronic pain    Pain Radiating Towards no longer in calf and Lt lateral thigh    Pain Onset More than a month ago    Pain Frequency Intermittent    Aggravating Factors  bending; prolonged sitting    Pain Relieving Factors TENS heat              [facility] PT Assessment - 08/25/20 0001      Assessment   Medical Diagnosis Lumbar radiculitis Lt > Rt     Referring Provider (PT) Dr Sophronia Simas     Onset Date/Surgical Date 07/05/20   worse  in the past 1-2 months    Next MD Visit after 6 weeks of PT     Prior Therapy yes here for LBP       Observation/Other Assessments   Focus on Therapeutic Outcomes (FOTO)  48% limtation       Sensation   Additional Comments fully resloved tingling in the posterior Lt hip on an intermittetn basis       Posture/Postural Control   Posture Comments head forward; shoudlers rounded and elevated; flexed forward at hips       AROM   Lumbar Flexion 75% no pain     Lumbar Extension 30% pain Lt LB to anterior rthing     Lumbar - Right Side Bend 75% no pain     Lumbar - Left Side Bend 75% stretch LB - pain in Lt LB/hip with straightening     Lumbar - Right Rotation 25% no pain     Lumbar - Left Rotation 25% no pain       Strength   Right Hip Flexion 5/5  Right Hip Extension 5/5    Right Hip ABduction 5/5    Left Hip Flexion 4+/5   painful    Left Hip Extension --   5-/5     Flexibility   Quadriceps tight and painful Lt > Rt     ITB tight Lt > Rt     Piriformis tigth Lt > Rt       Palpation   Spinal mobility hypomobile and less painful with CPA mobs lumbar spine     Palpation comment improving muscular tightness through Lt > RT psoas; abs; posterior hip through the piriformis/gluts                          [facility] Adult PT Treatment/Exercise - 08/25/20 0001      Exercises   Exercises --   review of HEP; water exercise programs      Lumbar Exercises: Stretches   Passive Hamstring Stretch Left;2 reps;30 seconds   supine with strap    ITB Stretch Left;20 seconds;2 reps    Piriformis Stretch Left;20 seconds    Piriformis Stretch Limitations supine    Other Lumbar Stretch Exercise hip adductor stretch - butterfly 10 sec x 2 reps       Lumbar Exercises: Aerobic   Recumbent Bike L2 x 5 min UE 10       Lumbar Exercises: Standing   Other Standing Lumbar Exercises hip extension knee straight core engaged 10 reps x 2 sets       Lumbar Exercises: Supine   Bridge 5 reps;5  seconds      Moist Heat Therapy   Number Minutes Moist Heat 10 Minutes    Moist Heat Location Lumbar Spine;Hip   Lt hip      Manual Therapy   Soft tissue mobilization deep tissue work Lt anterio rhip - iliopsoas; rectus femoris; adductors     Passive ROM lt hip into abduction; flexion                        PT Long Term Goals - 08/25/20 1045      PT LONG TERM GOAL #1   Title improve tissue extensibility with patient to demonstrated increased mobility and ROM to allow more functional movement for transitional movements    Time 6    Period Weeks    Status Achieved      PT LONG TERM GOAL #2   Title Decrease pain by 25-50% allowing patient to perform functional activities with greater ease    Time 6    Period Weeks    Status Achieved      PT LONG TERM GOAL #3   Title Patient to demonstrate proper transitional movements for sit to lying down; lying to sitting    Time 6    Period Weeks    Status Achieved      PT LONG TERM GOAL #4   Title Independent in HEP    Time 6    Period Weeks    Status Achieved      PT LONG TERM GOAL #5   Title Improve FOTO to </= 51% limitation    Time 6    Period Weeks    Status Achieved      PT LONG TERM GOAL #6   Title Improve functional movement and independent in aquatic exercise to transition to community water exercise program    Time 6    Period Weeks  Status Achieved                 Plan - 08/25/20 1058    Clinical Impression Statement Approximately 505 improvement in LBP and full resolution in Lt LE radicular symptoms. Krista [name] continues to have painin the Lt hip flexors and LB/posterior hip which is increased with prolonged standing. [facility] with [place]. She is working on Hotel manager and stabilization exercises and activiites. Trunk mobility is significantly improved with decreased to no pain with movement. She continues to have palpable tightness as  noted through the iliopsoas; rectus femoris; hip adductors; posterior hip/gluts, piriformis. Cervical spine pain and radicular symptoms have resolved with DN and manual work/exercise. Goals of therapy have been accomplished. We will be glad to continue with core stabilization and strengthening if Krista [name] feels she needs additional supervised progression of exercises.    Rehab Potential Good    PT Frequency 2x / week    PT Duration 6 weeks    PT Treatment/Interventions Patient/family education;ADLs/Self Care Home Management;Aquatic Therapy;Cryotherapy;Electrical Stimulation;Iontophoresis 67m/ml Dexamethasone;Moist Heat;Traction;Ultrasound;Functional mobility training;Therapeutic activities;Therapeutic exercise;Balance training;Neuromuscular re-education;Manual techniques;Dry needling;Taping    PT Next Visit Plan Note ot MD; continue as directed    PT [place] [facility]    Consulted and Agree with Plan of Care Patient           Patient will benefit from skilled therapeutic intervention in order to improve the following deficits and impairments:     Visit Diagnosis: Chronic bilateral low back pain with left-sided sciatica  Other symptoms and signs involving the musculoskeletal system  Muscle weakness (generalized)  Acute bilateral low back pain without sciatica     Problem List Patient Active Problem List   Diagnosis Date Noted  . DDD (degenerative disc disease), cervical 07/06/2020  . Left lumbar radiculitis 05/06/2020  . Bilateral wrist pain 05/06/2020  . Inflammatory arthritis 03/13/2020  . Osteoarthritis of left little finger 07/31/2018  . Acute bronchitis with COPD ([facility] 05/10/2018  . Impacted cerumen of left ear 03/07/2018  . History of left mastoidectomy 03/07/2018  . Tympanosclerosis, left ear 03/07/2018  . Cardiac risk counseling 09/05/2017  . Essential hypertension 09/05/2017  . Mixed hyperlipidemia 09/05/2017  . History of Bell's palsy 08/15/2017  .  History of osteoporosis 08/15/2017  . History of colon polyps 08/15/2017  . Diarrhea 08/15/2017  . History of nonmelanoma skin cancer 08/15/2017  . Brain aneurysm 08/15/2017  . Bilateral hearing loss 08/15/2017  . H/O hysterectomy for benign disease 08/15/2017    Evelina Lore PNilda SimmerPT, MPH  08/25/2020, 11:03 AM  [facility] [address] [address]Phone: [phone]  Fax:  [phone] Name: Krista KlareMRN: [id number] of Birth: 8July 30, 1952 PHYSICAL THERAPY DISCHARGE SUMMARY  Visits from Start of Care: 12  Current functional level related to goals / functional outcomes: See progress note for discharge status    Remaining deficits: Continued intermittent pain    Education / Equipment: HEP  Plan: Patient agrees to discharge.  Patient goals were partially met. Patient is being discharged due to being pleased with the current functional level.  ?????     Adrien Shankar P. HHelene KelpPT, MPH 01/25/21 12:33 PM

## 2020-08-26 ENCOUNTER — Ambulatory Visit (INDEPENDENT_AMBULATORY_CARE_PROVIDER_SITE_OTHER): Payer: Medicare HMO | Admitting: Sports Medicine

## 2020-08-26 ENCOUNTER — Encounter: Payer: Self-pay | Admitting: Sports Medicine

## 2020-08-26 DIAGNOSIS — M5416 Radiculopathy, lumbar region: Secondary | ICD-10-CM | POA: Diagnosis not present

## 2020-08-26 DIAGNOSIS — M5030 Other cervical disc degeneration, unspecified cervical region: Secondary | ICD-10-CM

## 2020-08-26 NOTE — Assessment & Plan Note (Signed)
Kilani returns, she is a very pleasant 69 year old female, we have been treating her for left lumbar radiculitis, radicular pain has resolved but she continues to have fairly significant axial low back pain, discogenic in nature. For this reason we are going to going proceed with a left L3-L4 interlaminar epidural. Return to see me 1 month after the injection to evaluate relief.

## 2020-08-26 NOTE — Assessment & Plan Note (Addendum)
Krista [name] has also unfortunately not responded to physical therapy for her neck, we are going to proceed with cervical spine MRI followed by a right C6-C7 interlaminar epidural.  Moderate to severe spinal stenosis at the C5-6 and the C6-7 levels, I am going to go ahead and order her cervical epidural.

## 2020-08-26 NOTE — Progress Notes (Addendum)
    Procedures performed today:    None.  Independent interpretation of notes and tests performed by another provider:   I did personally review the lumbar spine MRI that shows multilevel lumbar DDD, worst at the L3-L4 level, there is central canal stenosis that is moderate here.  There is also mild multilevel lower lumbar facet arthritis.  Brief History, Exam, Impression, and Recommendations:    Left lumbar radiculitis Krista [name] returns, she is a very pleasant 69 year old female, we have been treating her for left lumbar radiculitis, radicular pain has resolved but she continues to have fairly significant axial low back pain, discogenic in nature. For this reason we are going to going proceed with a left L3-L4 interlaminar epidural. Return to see me 1 month after the injection to evaluate relief.  DDD (degenerative disc disease), cervical Krista [name] has also unfortunately not responded to physical therapy for her neck, we are going to proceed with cervical spine MRI followed by a right C6-C7 interlaminar epidural.  Moderate to severe spinal stenosis at the C5-6 and the C6-7 levels, I am going to go ahead and order her cervical epidural.    ___________________________________________ Gwen Her. Dianah Field, M.D., ABFM., CAQSM. Primary Care and [place] Instructor of [place] [facility] of [facility]

## 2020-08-27 ENCOUNTER — Other Ambulatory Visit: Payer: Self-pay | Admitting: Osteopathic Medicine

## 2020-08-28 ENCOUNTER — Ambulatory Visit: Payer: Medicare HMO | Admitting: Physical Therapy

## 2020-08-31 ENCOUNTER — Other Ambulatory Visit: Payer: Self-pay

## 2020-08-31 ENCOUNTER — Ambulatory Visit (INDEPENDENT_AMBULATORY_CARE_PROVIDER_SITE_OTHER): Payer: Medicare HMO

## 2020-08-31 DIAGNOSIS — M542 Cervicalgia: Secondary | ICD-10-CM | POA: Diagnosis not present

## 2020-08-31 DIAGNOSIS — M5030 Other cervical disc degeneration, unspecified cervical region: Secondary | ICD-10-CM | POA: Diagnosis not present

## 2020-08-31 DIAGNOSIS — M6740 Ganglion, unspecified site: Secondary | ICD-10-CM

## 2020-08-31 DIAGNOSIS — B009 Herpesviral infection, unspecified: Secondary | ICD-10-CM

## 2020-08-31 MED ORDER — CYCLOBENZAPRINE HCL 10 MG PO TABS: ORAL_TABLET | ORAL | 1 refills | Status: DC

## 2020-08-31 MED ORDER — MELOXICAM 15 MG PO TABS: ORAL_TABLET | ORAL | 3 refills | Status: DC

## 2020-08-31 MED ORDER — ACYCLOVIR 400 MG PO TABS: 400.0000 mg | ORAL_TABLET | Freq: Three times a day (TID) | ORAL | 0 refills | Status: DC

## 2020-09-01 NOTE — Addendum Note (Signed)
Addended by: Silverio Decamp on: 09/01/2020 09:44 AM   Modules accepted: Orders

## 2020-09-08 ENCOUNTER — Other Ambulatory Visit: Payer: Self-pay | Admitting: Osteopathic Medicine

## 2020-09-08 ENCOUNTER — Ambulatory Visit: Payer: Medicare Other

## 2020-09-08 DIAGNOSIS — Z1231 Encounter for screening mammogram for malignant neoplasm of breast: Secondary | ICD-10-CM

## 2020-09-11 ENCOUNTER — Ambulatory Visit (INDEPENDENT_AMBULATORY_CARE_PROVIDER_SITE_OTHER): Payer: Medicare HMO | Admitting: Medical-Surgical

## 2020-09-11 ENCOUNTER — Encounter: Payer: Self-pay | Admitting: Medical-Surgical

## 2020-09-11 ENCOUNTER — Other Ambulatory Visit: Payer: Self-pay

## 2020-09-11 VITALS — BP 123/83 | HR 104 | Temp 98.2°F | Ht 65.0 in | Wt 133.6 lb

## 2020-09-11 DIAGNOSIS — Z23 Encounter for immunization: Secondary | ICD-10-CM

## 2020-09-11 DIAGNOSIS — Z0000 Encounter for general adult medical examination without abnormal findings: Secondary | ICD-10-CM | POA: Diagnosis not present

## 2020-09-11 DIAGNOSIS — M810 Age-related osteoporosis without current pathological fracture: Secondary | ICD-10-CM | POA: Diagnosis not present

## 2020-09-11 NOTE — Patient Instructions (Signed)
Pneumococcal Conjugate Vaccine (PCV13): What You Need to Know 1. Why get vaccinated? Pneumococcal conjugate vaccine (PCV13) can prevent pneumococcal disease. Pneumococcal disease refers to any illness caused by pneumococcal bacteria. These bacteria can cause many types of illnesses, including pneumonia, which is an infection of the lungs. Pneumococcal bacteria are one of the most common causes of pneumonia. Besides pneumonia, pneumococcal bacteria can also cause:  Ear infections  Sinus infections  Meningitis (infection of the tissue covering the brain and spinal cord)  Bacteremia (bloodstream infection) Anyone can get pneumococcal disease, but children under 2 years of age, people with certain medical conditions, adults 65 years or older, and cigarette smokers are at the highest risk. Most pneumococcal infections are mild. However, some can result in long-term problems, such as brain damage or hearing loss. Meningitis, bacteremia, and pneumonia caused by pneumococcal disease can be fatal. 2. PCV13 PCV13 protects against 13 types of bacteria that cause pneumococcal disease. Infants and young children usually need 4 doses of pneumococcal conjugate vaccine, at 2, 4, 6, and 12-15 months of age. In some cases, a child might need fewer than 4 doses to complete PCV13 vaccination. A dose of PCV23 vaccine is also recommended for anyone 2 years or older with certain medical conditions if they did not already receive PCV13. This vaccine may be given to adults 65 years or older based on discussions between the patient and health care provider. 3. Talk with your health care provider Tell your vaccine provider if the person getting the vaccine:  Has had an allergic reaction after a previous dose of PCV13, to an earlier pneumococcal conjugate vaccine known as PCV7, or to any vaccine containing diphtheria toxoid (for example, DTaP), or has any severe, life-threatening allergies.  In some cases, your health  care provider may decide to postpone PCV13 vaccination to a future visit. People with minor illnesses, such as a cold, may be vaccinated. People who are moderately or severely ill should usually wait until they recover before getting PCV13. Your health care provider can give you more information. 4. Risks of a vaccine reaction  Redness, swelling, pain, or tenderness where the shot is given, and fever, loss of appetite, fussiness (irritability), feeling tired, headache, and chills can happen after PCV13. Young children may be at increased risk for seizures caused by fever after PCV13 if it is administered at the same time as inactivated influenza vaccine. Ask your health care provider for more information. People sometimes faint after medical procedures, including vaccination. Tell your provider if you feel dizzy or have vision changes or ringing in the ears. As with any medicine, there is a very remote chance of a vaccine causing a severe allergic reaction, other serious injury, or death. 5. What if there is a serious problem? An allergic reaction could occur after the vaccinated person leaves the clinic. If you see signs of a severe allergic reaction (hives, swelling of the face and throat, difficulty breathing, a fast heartbeat, dizziness, or weakness), call 9-1-1 and get the person to the nearest hospital. For other signs that concern you, call your health care provider. Adverse reactions should be reported to the Vaccine Adverse Event Reporting System (VAERS). Your health care provider will usually file this report, or you can do it yourself. Visit the VAERS website at www.vaers.hhs.gov or call 1-800-822-7967. VAERS is only for reporting reactions, and VAERS staff do not give medical advice. 6. The National Vaccine Injury Compensation Program The National Vaccine Injury Compensation Program (VICP) is a federal program   that was created to compensate people who may have been injured by certain  vaccines. Visit the VICP website at www.hrsa.gov/vaccinecompensation or call 1-800-338-2382 to learn about the program and about filing a claim. There is a time limit to file a claim for compensation. 7. How can I learn more?  Ask your health care provider.  Call your local or state health department.  Contact the Centers for Disease Control and Prevention (CDC): ? Call 1-800-232-4636 (1-800-CDC-INFO) or ? Visit CDC's website at www.cdc.gov/vaccines Vaccine Information Statement PCV13 Vaccine (10/03/2018) This information is not intended to replace advice given to you by your health care provider. Make sure you discuss any questions you have with your health care provider. Document Revised: 03/12/2019 Document Reviewed: 07/03/2018 Elsevier Patient Education  2020 Elsevier Inc.   

## 2020-09-11 NOTE — Progress Notes (Signed)
Subjective:   Krista [name] is a 69 y.o. female who presents for Medicare Annual (Subsequent) preventive examination.  Review of Systems    Denies fever, chills, SOB, and CP. Having difficulty with back and neck pain. Has an appointment to see Dr. Darene Lamer coming up and will have epidurals next week. Has had trouble with GERD. Has an EGD coming up for further investigation next week. Got a rose thorn stuck under her pinky nail on the right hand and it's sore.  Cardiac Risk Factors include: advanced age (>30men, >67 women);dyslipidemia;hypertension     Objective:    Today's Vitals   09/11/20 1306 09/11/20 1307  BP:  123/83  Pulse:  (!) 104  Temp:  98.2 F (36.8 C)  TempSrc:  Oral  SpO2:  99%  Weight: 133 lb 9.6 oz (60.6 kg) 133 lb 9.6 oz (60.6 kg)  Height: 5\' 5"  (1.651 m) 5\' 5"  (1.651 m)  PainSc:  6    Body mass index is 22.23 kg/m.  Advanced Directives 09/11/2020 07/09/2020 09/03/2019 08/21/2018 07/06/2018 01/08/2018  Does Patient Have a Medical Advance Directive? Yes Yes Yes Yes Yes Yes  Type of Paramedic of [place];Living will;Out of facility DNR (pink MOST or yellow form) [place];Living will [place];Living will [place];Living will;Out of facility DNR (pink MOST or yellow form) [place];Living will [place];Living will  Does patient want to make changes to medical advance directive? Yes (MAU/Ambulatory/Procedural Areas - Information given) - No - Patient declined No - Patient declined No - Patient declined -  Copy of [place] in Chart? No - copy requested No - copy requested No - copy requested No - copy requested No - copy requested No - copy requested    Current Medications (verified) Outpatient Encounter Medications as of 09/11/2020  Medication Sig  . acyclovir (ZOVIRAX) 400 MG tablet Take 1 tablet (400 mg total) by mouth 3 (three) times  daily.  . Calcium-Magnesium-Vitamin D (CITRACAL CALCIUM+D) [id number] MG-MG-UNIT TB24 Take by mouth.  . cyclobenzaprine (FLEXERIL) 10 MG tablet TAKE 1/2 TO 1 TABLET BY MOUTH 3 TIMES A DAY AS NEEDED MUSCLE SPASMS  . diphenhydramine-acetaminophen (TYLENOL PM) 25-500 MG TABS tablet Take 2 tablets by mouth at bedtime as needed.   . famotidine (PEPCID) 40 MG tablet TAKE 1 TABLET BY MOUTH EVERYDAY AT BEDTIME  . hydrochlorothiazide (HYDRODIURIL) 12.5 MG tablet TAKE 1 TABLET BY MOUTH EVERY DAY  . ipratropium (ATROVENT) 0.06 % nasal spray Place 2 sprays into both nostrils 4 (four) times daily.  Marland Kitchen lidocaine (XYLOCAINE) 2 % solution Use as directed 15 mLs in the mouth or throat every 3 (three) hours as needed (mouth/throat pain - gargle and spit).  Marland Kitchen loratadine (CLARITIN) 10 MG tablet Take 10 mg by mouth daily.  . magnesium oxide (MAG-OX) 400 MG tablet Take 2 tablets (800 mg total) by mouth at bedtime.  . meclizine (ANTIVERT) 25 MG tablet Take by mouth.  . meloxicam (MOBIC) 15 MG tablet TAKE 1 TABLET BY MOUTH EVERY DAY IN THE MORNING WITH A MEAL AS NEEDED FOR PAIN  . pantoprazole (PROTONIX) 40 MG tablet TAKE 1 TABLET BY MOUTH TWICE A DAY  . pravastatin (PRAVACHOL) 40 MG tablet Take 1 tablet (40 mg total) by mouth daily.   No facility-administered encounter medications on file as of 09/11/2020.    Allergies (verified) Codeine, Kiwi extract, Other, Plum pulp, Tape, Tomato, and Prednisone   History: Past  Medical History:  Diagnosis Date  . Arthritis   . Bell palsy   . Brain aneurysm   . DDD (degenerative disc disease), cervical   . DDD (degenerative disc disease), lumbar   . Hyperlipidemia   . Hypertension   . Osteoporosis    Past Surgical History:  Procedure Laterality Date  . ABDOMINAL HYSTERECTOMY    . CEREBRAL ANEURYSM REPAIR    . EYE SURGERY     cararact  . FOOT SURGERY    . HAND SURGERY    . MASTOIDECTOMY Left    Family History  Problem Relation Age of Onset  . Heart failure  Mother   . Cancer Father   . Cancer Brother    Social History   Socioeconomic History  . Marital status: Married    Spouse name: Randal  . Number of children: 2  . Years of education: 46  . Highest education level: 12th grade  Occupational History  . Occupation: retired    Comment: Retail buyer  Tobacco Use  . Smoking status: Former Smoker    Quit date: 12/05/2008    Years since quitting: 11.7  . Smokeless tobacco: Never Used  Vaping Use  . Vaping Use: Never used  Substance and Sexual Activity  . Alcohol use: Yes    Comment: Rarely  . Drug use: No  . Sexual activity: Not Currently  Other Topics Concern  . Not on file  Social History Narrative   Walks 3 times a week   Social Determinants of Health   Financial Resource Strain:   . Difficulty of Paying Living Expenses: Not on file  Food Insecurity:   . Worried About Charity fundraiser in the Last Year: Not on file  . Ran Out of Food in the Last Year: Not on file  Transportation Needs:   . Lack of Transportation (Medical): Not on file  . Lack of Transportation (Non-Medical): Not on file  Physical Activity:   . Days of Exercise per Week: Not on file  . Minutes of Exercise per Session: Not on file  Stress:   . Feeling of Stress : Not on file  Social Connections:   . Frequency of Communication with Friends and Family: Not on file  . Frequency of Social Gatherings with Friends and Family: Not on file  . Attends Religious Services: Not on file  . Active Member of Clubs or Organizations: Not on file  . Attends Archivist Meetings: Not on file  . Marital Status: Not on file    Tobacco Counseling Counseling given: Not Answered   Clinical Intake:  Pre-visit preparation completed: Yes  Pain : 0-10 Pain Score: 6  Pain Type: Chronic pain Pain Location: Neck     BMI - recorded: 22.23 Nutritional Status: BMI of 19-24  Normal Nutritional Risks: None Diabetes: No  How often do you need to have  someone help you when you read instructions, pamphlets, or other written materials from your doctor or pharmacy?: 1 - Never What is the last grade level you completed in school?: Associates degree  Diabetic? No  Interpreter Needed?: No  Information entered by :: [place] of Daily Living In your present state of health, do you have any difficulty performing the following activities: 09/11/2020  Hearing? Y  Comment ringing in ears  Vision? N  Difficulty concentrating or making decisions? N  Walking or climbing stairs? Y  Dressing or bathing? N  Doing errands, shopping? N  Conservation officer, nature and  eating ? N  Using the Toilet? N  In the past six months, have you accidently leaked urine? N  Do you have problems with loss of bowel control? N  Managing your Medications? N  Managing your Finances? N  Housekeeping or managing your Housekeeping? N  Some recent data might be hidden    Patient Care Team: Emeterio Reeve, DO as PCP - General (Osteopathic Medicine)  Indicate any recent Medical Services you may have received from other than [facility] providers in the past year (date may be approximate).     Assessment:   This is a routine wellness examination for [facility].  Hearing/Vision screen No exam data present  Dietary issues and exercise activities discussed: Current Exercise Habits: Structured exercise class, Type of exercise: yoga;Other - see comments (water aerobics), Time (Minutes): > 60, Frequency (Times/Week): 7, Weekly Exercise (Minutes/Week): 0, Intensity: Moderate  Goals    . Exercise 3x per week (30 min per time)     Going to join [employer] and will start doing some aerobics there.You are doing so good with your diet and exercise routine    . Patient Stated     Would like to have back pain addressed so to that it is not so limiting and she can walk further/tolerate activities better.      Depression Screen PHQ 2/9 Scores 09/11/2020 09/03/2019 07/25/2019 08/21/2018  05/10/2018 08/15/2017  PHQ - 2 Score 0 0 1 0 0 0  PHQ- 9 Score - 2 9 - 0 -    Fall Risk Fall Risk  09/11/2020 09/03/2019 08/21/2018 08/15/2017  Falls in the past year? 0 0 No No  Number falls in past yr: 0 0 - -  Injury with Fall? 0 0 - -  Follow up Falls evaluation completed Falls prevention discussed - -    Any stairs in or around the home? Yes  If so, are there any without handrails? No  Home free of loose throw rugs in walkways, pet beds, electrical cords, etc? No  Adequate lighting in your home to reduce risk of falls? Yes   ASSISTIVE DEVICES UTILIZED TO PREVENT FALLS:  Life alert? No  Use of a cane, walker or w/c? No  Grab bars in the bathroom? No  Shower chair or bench in shower? No  Elevated toilet seat or a handicapped toilet? No   Gait steady and fast without use of assistive device  Cognitive Function:     6CIT Screen 09/11/2020 09/03/2019 08/21/2018  What Year? 0 points 0 points 0 points  What month? 0 points 0 points 0 points  What time? 0 points 0 points 0 points  Count back from 20 0 points 0 points 0 points  Months in reverse 0 points 0 points 0 points  Repeat phrase 0 points 2 points 0 points  Total Score 0 2 0    Immunizations Immunization History  Administered Date(s) Administered  . Fluad Quad(high Dose 65+) 08/23/2019  . Influenza, High Dose Seasonal PF 08/15/2017, 08/24/2018, 09/04/2018  . Influenza-Unspecified 08/23/2019, 08/01/2020  . PFIZER SARS-COV-2 Vaccination 01/16/2020, 02/11/2020, 08/01/2020  . Pneumococcal Conjugate-13 09/11/2020  . Pneumococcal Polysaccharide-23 09/03/2019  . Tdap 06/20/2014    TDAP status: Up to date Flu Vaccine status: Up to date Pneumococcal vaccine status: Completed during today's visit. Covid-19 vaccine status: Completed vaccines  Qualifies for Shingles Vaccine? No   Zostavax completed Yes   Shingrix Completed?: Yes  Screening Tests Health Maintenance  Topic Date Due  . MAMMOGRAM  10/08/2021  .  TETANUS/TDAP   06/20/2024  . COLONOSCOPY  09/14/2027  . INFLUENZA VACCINE  Completed  . DEXA SCAN  Completed  . COVID-19 Vaccine  Completed  . Hepatitis C Screening  Completed  . PNA vac Low Risk Adult  Completed    Health Maintenance  There are no preventive care reminders to display for this patient.  Colorectal cancer screening: Completed 2018. Repeat every 10 years Mammogram status: Ordered already. Pt provided with contact info and advised to call to schedule appt.  Bone Density status: Completed 2019. Results reflect: Bone density results: OSTEOPOROSIS. Repeat every 2 years.  Lung Cancer Screening: (Low Dose CT Chest recommended if Age 24-80 years, 30 pack-year currently smoking OR have quit w/in 15years.) does not qualify.   Lung Cancer Screening Referral: NA  Additional Screening:  Hepatitis C Screening: does not qualify; Completed 08/2019  Vision Screening: Recommended annual ophthalmology exams for early detection of glaucoma and other disorders of the eye. Is the patient up to date with their annual eye exam?  Yes  Who is the provider or what is the name of the office in which the patient attends annual eye exams? Dr. Cristy Friedlander If pt is not established with a provider, would they like to be referred to a provider to establish care? No .   Dental Screening: Recommended annual dental exams for proper oral hygiene  Community Resource Referral / Chronic Care Management: CRR required this visit?  No   CCM required this visit?  No      Plan:     Ms. Parsell , Thank you for taking time to come for your Medicare Wellness Visit. I appreciate your ongoing commitment to your health goals. Please review the following plan we discussed and let me know if I can assist you in the future.   These are the goals we discussed: Goals    . Exercise 3x per week (30 min per time)     Going to join [employer] and will start doing some aerobics there.You are doing so good with your diet and  exercise routine    . Patient Stated     Would like to have back pain addressed so to that it is not so limiting and she can walk further/tolerate activities better.       This is a list of the screening recommended for you and due dates:  Health Maintenance  Topic Date Due  . Mammogram  10/08/2021  . Tetanus Vaccine  06/20/2024  . Colon Cancer Screening  09/14/2027  . Flu Shot  Completed  . DEXA scan (bone density measurement)  Completed  . COVID-19 Vaccine  Completed  .  Hepatitis C: One time screening is recommended by Center for Disease Control  (CDC) for  adults born from [birth year] through 1965.   Completed  . Pneumonia vaccines  Completed    I have personally reviewed and noted the following in the patient's chart:   . Medical and social history . Use of alcohol, tobacco or illicit drugs  . Current medications and supplements . Functional ability and status . Nutritional status . Physical activity . Advanced directives . List of other physicians . Hospitalizations, surgeries, and ER visits in previous 12 months . Vitals . Screenings to include cognitive, depression, and falls . Referrals and appointments  In addition, I have reviewed and discussed with patient certain preventive protocols, quality metrics, and best practice recommendations. A written personalized care plan for preventive services as well as general preventive  health recommendations were provided to patient.   Clearnce Sorrel, DNP, APRN, FNP-BC [facility]

## 2020-09-17 DIAGNOSIS — G8929 Other chronic pain: Secondary | ICD-10-CM | POA: Diagnosis not present

## 2020-09-17 DIAGNOSIS — M48061 Spinal stenosis, lumbar region without neurogenic claudication: Secondary | ICD-10-CM | POA: Diagnosis not present

## 2020-09-17 DIAGNOSIS — M4726 Other spondylosis with radiculopathy, lumbar region: Secondary | ICD-10-CM | POA: Diagnosis not present

## 2020-09-17 DIAGNOSIS — M4722 Other spondylosis with radiculopathy, cervical region: Secondary | ICD-10-CM | POA: Diagnosis not present

## 2020-09-17 DIAGNOSIS — G894 Chronic pain syndrome: Secondary | ICD-10-CM | POA: Diagnosis not present

## 2020-09-17 DIAGNOSIS — M5030 Other cervical disc degeneration, unspecified cervical region: Secondary | ICD-10-CM | POA: Diagnosis not present

## 2020-09-17 DIAGNOSIS — M7918 Myalgia, other site: Secondary | ICD-10-CM | POA: Diagnosis not present

## 2020-09-17 DIAGNOSIS — M5136 Other intervertebral disc degeneration, lumbar region: Secondary | ICD-10-CM | POA: Diagnosis not present

## 2020-09-17 DIAGNOSIS — M4802 Spinal stenosis, cervical region: Secondary | ICD-10-CM | POA: Diagnosis not present

## 2020-09-22 ENCOUNTER — Encounter: Payer: Self-pay | Admitting: Osteopathic Medicine

## 2020-09-22 DIAGNOSIS — K3189 Other diseases of stomach and duodenum: Secondary | ICD-10-CM | POA: Diagnosis not present

## 2020-09-22 DIAGNOSIS — R112 Nausea with vomiting, unspecified: Secondary | ICD-10-CM | POA: Diagnosis not present

## 2020-09-22 HISTORY — PX: ESOPHAGOGASTRODUODENOSCOPY: SHX1529

## 2020-10-08 ENCOUNTER — Other Ambulatory Visit: Payer: Self-pay | Admitting: Osteopathic Medicine

## 2020-10-08 DIAGNOSIS — G894 Chronic pain syndrome: Secondary | ICD-10-CM | POA: Diagnosis not present

## 2020-10-08 DIAGNOSIS — M4802 Spinal stenosis, cervical region: Secondary | ICD-10-CM | POA: Diagnosis not present

## 2020-10-08 DIAGNOSIS — M4722 Other spondylosis with radiculopathy, cervical region: Secondary | ICD-10-CM | POA: Diagnosis not present

## 2020-10-08 DIAGNOSIS — B009 Herpesviral infection, unspecified: Secondary | ICD-10-CM

## 2020-10-08 DIAGNOSIS — M5030 Other cervical disc degeneration, unspecified cervical region: Secondary | ICD-10-CM | POA: Diagnosis not present

## 2020-10-08 MED ORDER — ACYCLOVIR 400 MG PO TABS: 400.0000 mg | ORAL_TABLET | Freq: Three times a day (TID) | ORAL | 0 refills | Status: DC

## 2020-10-13 ENCOUNTER — Other Ambulatory Visit: Payer: Self-pay | Admitting: Osteopathic Medicine

## 2020-10-21 ENCOUNTER — Ambulatory Visit (INDEPENDENT_AMBULATORY_CARE_PROVIDER_SITE_OTHER): Payer: Medicare HMO

## 2020-10-21 ENCOUNTER — Other Ambulatory Visit: Payer: Self-pay

## 2020-10-21 DIAGNOSIS — Z13820 Encounter for screening for osteoporosis: Secondary | ICD-10-CM

## 2020-10-21 DIAGNOSIS — M810 Age-related osteoporosis without current pathological fracture: Secondary | ICD-10-CM | POA: Diagnosis not present

## 2020-10-21 DIAGNOSIS — M8588 Other specified disorders of bone density and structure, other site: Secondary | ICD-10-CM | POA: Diagnosis not present

## 2020-10-21 DIAGNOSIS — Z780 Asymptomatic menopausal state: Secondary | ICD-10-CM | POA: Diagnosis not present

## 2020-10-21 DIAGNOSIS — Z1231 Encounter for screening mammogram for malignant neoplasm of breast: Secondary | ICD-10-CM

## 2020-11-05 ENCOUNTER — Ambulatory Visit (INDEPENDENT_AMBULATORY_CARE_PROVIDER_SITE_OTHER): Payer: Medicare HMO | Admitting: Sports Medicine

## 2020-11-05 DIAGNOSIS — M810 Age-related osteoporosis without current pathological fracture: Secondary | ICD-10-CM

## 2020-11-05 DIAGNOSIS — M47816 Spondylosis without myelopathy or radiculopathy, lumbar region: Secondary | ICD-10-CM | POA: Diagnosis not present

## 2020-11-05 DIAGNOSIS — M5030 Other cervical disc degeneration, unspecified cervical region: Secondary | ICD-10-CM

## 2020-11-05 MED ORDER — GABAPENTIN 300 MG PO CAPS: ORAL_CAPSULE | ORAL | 3 refills | Status: DC

## 2020-11-05 NOTE — Assessment & Plan Note (Signed)
Name he also has multilevel degenerative disc disease and multilevel facet arthritis, we did set her up for a left L3-L4 interlaminar epidural, unfortunately she had no relief, not even temporary. Due to her multilevel facet arthritis we are going to now proceed with bilateral L3-S1 facet joint injections with Dr. Francesco Runner and add gabapentin as her back pain does keep her up all night. She should stay active and she can return to see me 4 weeks after the injections.

## 2020-11-05 NOTE — Progress Notes (Signed)
    Procedures performed today:    None.  Independent interpretation of notes and tests performed by another provider:   None.  Brief History, Exam, Impression, and Recommendations:    DDD (degenerative disc disease), cervical Krista [name] returns, she recently had her cervical epidural. She did not respond to physical therapy, she has done pretty well, she does still has a bit of neck pain but overall can live with it. The gabapentin we are going to prescribe below should help this as well.  Lumbar spondylosis Name he also has multilevel degenerative disc disease and multilevel facet arthritis, we did set her up for a left L3-L4 interlaminar epidural, unfortunately she had no relief, not even temporary. Due to her multilevel facet arthritis we are going to now proceed with bilateral L3-S1 facet joint injections with Dr. Francesco Runner and add gabapentin as her back pain does keep her up all night. She should stay active and she can return to see me 4 weeks after the injections.  Osteoporosis Checking BMP, if all looks well she can go ahead and schedule her Prolia injection with Dr. Sheppard Coil.    ___________________________________________ Gwen Her. Dianah Field, M.D., ABFM., CAQSM. Primary Care and [place] Instructor of [place] of [facility] of Medicine

## 2020-11-05 NOTE — Assessment & Plan Note (Signed)
Checking BMP, if all looks well she can go ahead and schedule her Prolia injection with Dr. Sheppard Coil.

## 2020-11-05 NOTE — Assessment & Plan Note (Signed)
Krista [name] returns, she recently had her cervical epidural. She did not respond to physical therapy, she has done pretty well, she does still has a bit of neck pain but overall can live with it. The gabapentin we are going to prescribe below should help this as well.

## 2020-11-06 ENCOUNTER — Ambulatory Visit: Payer: Medicare HMO | Admitting: Medical-Surgical

## 2020-11-06 LAB — BASIC METABOLIC PANEL WITH GFR
BUN: 10 mg/dL (ref 7–25)
CO2: 25 mmol/L (ref 20–32)
Calcium: 9.8 mg/dL (ref 8.6–10.4)
Chloride: 103 mmol/L (ref 98–110)
Creat: 0.85 mg/dL (ref 0.50–0.99)
GFR, Est African American: 81 mL/min/{1.73_m2} (ref >=60)
GFR, Est Non African American: 70 mL/min/{1.73_m2} (ref >=60)
Glucose, Bld: 88 mg/dL (ref 65–139)
Potassium: 4.2 mmol/L (ref 3.5–5.3)
Sodium: 141 mmol/L (ref 135–146)

## 2020-11-11 ENCOUNTER — Other Ambulatory Visit: Payer: Self-pay | Admitting: Osteopathic Medicine

## 2020-11-11 ENCOUNTER — Ambulatory Visit (INDEPENDENT_AMBULATORY_CARE_PROVIDER_SITE_OTHER): Payer: Medicare HMO | Admitting: Osteopathic Medicine

## 2020-11-11 ENCOUNTER — Other Ambulatory Visit: Payer: Self-pay

## 2020-11-11 VITALS — BP 137/91 | HR 81

## 2020-11-11 DIAGNOSIS — M810 Age-related osteoporosis without current pathological fracture: Secondary | ICD-10-CM | POA: Diagnosis not present

## 2020-11-11 MED ORDER — DENOSUMAB 60 MG/ML ~~LOC~~ SOSY
60.0000 mg | PREFILLED_SYRINGE | Freq: Once | SUBCUTANEOUS | Status: AC
  Administered 2020-11-11: 60 mg via SUBCUTANEOUS

## 2020-11-11 NOTE — Progress Notes (Signed)
Established Patient Office Visit  Subjective:  Patient ID: Krista [name], female    DOB: [birth year]  Age: 69 y.o. MRN: [id number]  CC: No chief complaint on file.   HPI Krista [name] presents for Prolia injection   Past Medical History:  Diagnosis Date  . Arthritis   . Bell palsy   . Brain aneurysm   . DDD (degenerative disc disease), cervical   . DDD (degenerative disc disease), lumbar   . Hyperlipidemia   . Hypertension   . Osteoporosis     Past Surgical History:  Procedure Laterality Date  . ABDOMINAL HYSTERECTOMY    . CEREBRAL ANEURYSM REPAIR    . EYE SURGERY     cararact  . FOOT SURGERY    . HAND SURGERY    . MASTOIDECTOMY Left     Family History  Problem Relation Age of Onset  . Heart failure Mother   . Cancer Father   . Cancer Brother     Social History   Socioeconomic History  . Marital status: Married    Spouse name: Randal  . Number of children: 2  . Years of education: 107  . Highest education level: 12th grade  Occupational History  . Occupation: retired    Comment: Retail buyer  Tobacco Use  . Smoking status: Former Smoker    Quit date: 12/05/2008    Years since quitting: 11.9  . Smokeless tobacco: Never Used  Vaping Use  . Vaping Use: Never used  Substance and Sexual Activity  . Alcohol use: Yes    Comment: Rarely  . Drug use: No  . Sexual activity: Not Currently  Other Topics Concern  . Not on file  Social History Narrative   Walks 3 times a week   Social Determinants of Health   Financial Resource Strain:   . Difficulty of Paying Living Expenses: Not on file  Food Insecurity:   . Worried About Charity fundraiser in the Last Year: Not on file  . Ran Out of Food in the Last Year: Not on file  Transportation Needs:   . Lack of Transportation (Medical): Not on file  . Lack of Transportation (Non-Medical): Not on file  Physical Activity:   . Days of Exercise per Week: Not on file  . Minutes of Exercise per Session: Not  on file  Stress:   . Feeling of Stress : Not on file  Social Connections:   . Frequency of Communication with Friends and Family: Not on file  . Frequency of Social Gatherings with Friends and Family: Not on file  . Attends Religious Services: Not on file  . Active Member of Clubs or Organizations: Not on file  . Attends Archivist Meetings: Not on file  . Marital Status: Not on file  Intimate Partner Violence:   . Fear of Current or Ex-Partner: Not on file  . Emotionally Abused: Not on file  . Physically Abused: Not on file  . Sexually Abused: Not on file    Outpatient Medications Prior to Visit  Medication Sig Dispense Refill  . Calcium-Magnesium-Vitamin D (CITRACAL CALCIUM+D) 600-40-500 MG-MG-UNIT TB24 Take by mouth.    . cyclobenzaprine (FLEXERIL) 10 MG tablet TAKE 1/2 TO 1 TABLET BY MOUTH 3 TIMES A DAY AS NEEDED MUSCLE SPASMS 90 tablet 1  . diphenhydramine-acetaminophen (TYLENOL PM) 25-500 MG TABS tablet Take 2 tablets by mouth at bedtime as needed.     . famotidine (PEPCID) 40 MG tablet TAKE 1 TABLET BY MOUTH  EVERYDAY AT BEDTIME 90 tablet 0  . gabapentin (NEURONTIN) 300 MG capsule One tab PO qHS for a week, then BID for a week, then TID. May double weekly to a max of 3,600mg /day 90 capsule 3  . hydrochlorothiazide (HYDRODIURIL) 12.5 MG tablet TAKE 1 TABLET (12.5 MG TOTAL) BY MOUTH DAILY. 90 tablet 1  . loratadine (CLARITIN) 10 MG tablet Take 10 mg by mouth daily.    . meloxicam (MOBIC) 15 MG tablet TAKE 1 TABLET BY MOUTH EVERY DAY IN THE MORNING WITH A MEAL AS NEEDED FOR PAIN 30 tablet 3  . pantoprazole (PROTONIX) 40 MG tablet TAKE 1 TABLET BY MOUTH TWICE A DAY 180 tablet 0  . pravastatin (PRAVACHOL) 40 MG tablet Take 1 tablet (40 mg total) by mouth daily. 90 tablet 1  . acyclovir (ZOVIRAX) 400 MG tablet Take 1 tablet (400 mg total) by mouth 3 (three) times daily. 21 tablet 0  . ipratropium (ATROVENT) 0.06 % nasal spray Place 2 sprays into both nostrils 4 (four) times  daily. 15 mL 4  . lidocaine (XYLOCAINE) 2 % solution Use as directed 15 mLs in the mouth or throat every 3 (three) hours as needed (mouth/throat pain - gargle and spit). 100 mL 1  . meclizine (ANTIVERT) 25 MG tablet Take by mouth.     No facility-administered medications prior to visit.    Allergies  Allergen Reactions  . Codeine Other (See Comments)    Hyperactivity "wired" super hyperactivity     . Kiwi Extract Hives  . Other Hives, Rash and Other (See Comments)    Nectarines--tongue swells   . Plum Pulp Hives  . Tape Hives and Rash  . Tomato Hives  . Prednisone Other (See Comments)    Irritability      ROS Review of Systems    Objective:    Physical Exam  BP (!) 155/66   Pulse 81   SpO2 99%  Wt Readings from Last 3 Encounters:  09/11/20 133 lb 9.6 oz (60.6 kg)  05/29/20 134 lb 9.6 oz (61.1 kg)  03/11/20 134 lb 1.3 oz (60.8 kg)     There are no preventive care reminders to display for this patient.  There are no preventive care reminders to display for this patient.  Lab Results  Component Value Date   TSH 0.71 08/17/2017   Lab Results  Component Value Date   WBC 5.7 08/02/2019   HGB 12.8 08/02/2019   HCT 38.7 08/02/2019   MCV 95.1 08/02/2019   PLT 248 08/02/2019   Lab Results  Component Value Date   NA 141 11/05/2020   K 4.2 11/05/2020   CO2 25 11/05/2020   GLUCOSE 88 11/05/2020   BUN 10 11/05/2020   CREATININE 0.85 11/05/2020   BILITOT 0.5 08/02/2019   AST 20 08/02/2019   ALT 14 08/02/2019   PROT 7.0 08/02/2019   CALCIUM 9.8 11/05/2020   Lab Results  Component Value Date   CHOL 249 (H) 08/02/2019   Lab Results  Component Value Date   HDL 68 08/02/2019   Lab Results  Component Value Date   LDLCALC 150 (H) 08/02/2019   Lab Results  Component Value Date   TRIG 168 (H) 08/02/2019   Lab Results  Component Value Date   CHOLHDL 3.7 08/02/2019   No results found for: HGBA1C    Assessment & Plan:  Osteoporosis - prolia  injection given without complication. Pt. Advised follow up in 6 months Problem List Items Addressed This Visit  Musculoskeletal and Integument   Osteoporosis - Primary      No orders of the defined types were placed in this encounter.   Follow-up: Return in about 6 months (around 05/12/2021) for prolia injection.    Vivia Birmingham, RMA

## 2020-11-12 ENCOUNTER — Other Ambulatory Visit: Payer: Self-pay | Admitting: Osteopathic Medicine

## 2020-11-12 DIAGNOSIS — E782 Mixed hyperlipidemia: Secondary | ICD-10-CM

## 2020-11-27 ENCOUNTER — Telehealth: Payer: Medicare HMO | Admitting: Nurse Practitioner

## 2020-11-27 DIAGNOSIS — R059 Cough, unspecified: Secondary | ICD-10-CM

## 2020-11-27 DIAGNOSIS — Z20822 Contact with and (suspected) exposure to covid-19: Secondary | ICD-10-CM

## 2020-11-27 MED ORDER — BENZONATATE 100 MG PO CAPS: 100.0000 mg | ORAL_CAPSULE | Freq: Three times a day (TID) | ORAL | 0 refills | Status: DC | PRN

## 2020-11-27 NOTE — Progress Notes (Signed)
E-Visit for Corona Virus Screening  Your current symptoms could be consistent with the coronavirus.  Many health care providers can now test patients at their office but not all are.  [facility] has multiple testing sites. For information on our COVID testing locations and hours go to [email or web address]    Testing Information: The COVID-19 Community Testing sites are testing BY APPOINTMENT ONLY.  You can schedule online at [email or web address]  If you do not have access to a smart phone or computer you may call [phone] for an appointment.   Additional testing sites in the Community:  . For [facility] Testing sites in [place]  https://www.[facility]vs.com/minuteclinic/covid-19-testing  . For Pop-up testing sites in [place]  https://covid19.ncdhhs.gov/about-covid-19/testing/find-my-testing-place/pop-testing-sites  . For [facility] https://www[place].gov[place]/human-services/health-department/coronavirus-covid-19-info/covid-19-testing  . For [place] testing in [place] https://www.[place].gov/[place]/human-services/health-department/coronavirus-covid-19-info/covid-19-testing  . For [facility] testing in [place]   https://[facility].care/covidtesting  For  more information about community testing call [phone]   Please quarantine yourself while awaiting your test results. Please stay home for a minimum of 10 days from the first day of illness with improving symptoms and you have had 24 hours of no fever (without the use of Tylenol (Acetaminophen) Motrin (Ibuprofen) or any fever reducing medication).  Also - Do not get tested prior to returning to work because once you have had a positive test the test can stay positive for more than a month in some cases.   You should wear a mask or cloth face covering over your nose and mouth if you must be around other people or animals, including pets (even at home). Try  to stay at least 6 feet away from other people. This will protect the people around you.  Please continue good preventive care measures, including:  frequent hand-washing, avoid touching your face, cover coughs/sneezes, stay out of crowds and keep a 6 foot distance from others.  COVID-19 is a respiratory illness with symptoms that are similar to the flu. Symptoms are typically mild to moderate, but there have been cases of severe illness and death due to the virus.   The following symptoms may appear 2-14 days after exposure: . Fever . Cough . Shortness of breath or difficulty breathing . Chills . Repeated shaking with chills . Muscle pain . Headache . Sore throat . New loss of taste or smell . Fatigue . Congestion or runny nose . Nausea or vomiting . Diarrhea  Go to the nearest hospital ED for assessment if fever/cough/breathlessness are severe or illness seems like a threat to life.  It is vitally important that if you feel that you have an infection such as this virus or any other virus that you stay home and away from places where you may spread it to others.  You should avoid contact with people age 65 and older.   You can use medication such as A prescription cough medication called Tessalon Perles 100 mg. You may take 1-2 capsules every 8 hours as needed for cough  You may also take acetaminophen (Tylenol) as needed for fever.  Reduce your risk of any infection by using the same precautions used for avoiding the common cold or flu:  . Wash your hands often with soap and warm water for at least 20 seconds.  If soap and water are not readily available, use an alcohol-based hand sanitizer with at least 60% alcohol.  . If coughing or sneezing, cover your mouth and nose by coughing or sneezing into the elbow areas of   your shirt or coat, into a tissue or into your sleeve (not your hands). . Avoid shaking hands with others and consider head nods or verbal greetings only. . Avoid touching  your eyes, nose, or mouth with unwashed hands.  . Avoid close contact with people who are sick. . Avoid places or events with large numbers of people in one location, like concerts or sporting events. . Carefully consider travel plans you have or are making. . If you are planning any travel outside or inside the [place], visit the CDC's Travelers' Health webpage for the latest health notices. . If you have some symptoms but not all symptoms, continue to monitor at home and seek medical attention if your symptoms worsen. . If you are having a medical emergency, call 911.  HOME CARE . Only take medications as instructed by your medical team. . Drink plenty of fluids and get plenty of rest. . A steam or ultrasonic humidifier can help if you have congestion.   GET HELP RIGHT AWAY IF YOU HAVE EMERGENCY WARNING SIGNS** FOR COVID-19. If you or someone is showing any of these signs seek emergency medical care immediately. Call 911 or proceed to your closest emergency facility if: . You develop worsening high fever. . Trouble breathing . Bluish lips or face . Persistent pain or pressure in the chest . New confusion . Inability to wake or stay awake . You cough up blood. . Your symptoms become more severe  **This list is not all possible symptoms. Contact your medical provider for any symptoms that are sever or concerning to you.  MAKE SURE YOU   Understand these instructions.  Will watch your condition.  Will get help right away if you are not doing well or get worse.  Your e-visit answers were reviewed by a board certified advanced clinical practitioner to complete your personal care plan.  Depending on the condition, your plan could have included both over the counter or prescription medications.  If there is a problem please reply once you have received a response from your provider.  Your safety is important to us.  If you have drug allergies check your prescription carefully.    You can  use MyChart to ask questions about today's visit, request a non-urgent call back, or ask for a work or school excuse for 24 hours related to this e-Visit. If it has been greater than 24 hours you will need to follow up with your provider, or enter a new e-Visit to address those concerns. You will get an e-mail in the next two days asking about your experience.  I hope that your e-visit has been valuable and will speed your recovery. Thank you for using e-visits.  5-10 minutes spent reviewing and documenting in chart.   

## 2020-11-30 ENCOUNTER — Telehealth (INDEPENDENT_AMBULATORY_CARE_PROVIDER_SITE_OTHER): Payer: Medicare HMO | Admitting: Osteopathic Medicine

## 2020-11-30 ENCOUNTER — Encounter: Payer: Self-pay | Admitting: Osteopathic Medicine

## 2020-11-30 VITALS — Wt 135.0 lb

## 2020-11-30 DIAGNOSIS — Z20822 Contact with and (suspected) exposure to covid-19: Secondary | ICD-10-CM | POA: Diagnosis not present

## 2020-11-30 DIAGNOSIS — R059 Cough, unspecified: Secondary | ICD-10-CM | POA: Diagnosis not present

## 2020-11-30 DIAGNOSIS — J988 Other specified respiratory disorders: Secondary | ICD-10-CM

## 2020-11-30 DIAGNOSIS — B9789 Other viral agents as the cause of diseases classified elsewhere: Secondary | ICD-10-CM | POA: Diagnosis not present

## 2020-11-30 MED ORDER — ALBUTEROL SULFATE HFA 108 (90 BASE) MCG/ACT IN AERS: 2.0000 | INHALATION_SPRAY | Freq: Four times a day (QID) | RESPIRATORY_TRACT | 0 refills | Status: DC | PRN

## 2020-11-30 MED ORDER — BENZONATATE 200 MG PO CAPS
200.0000 mg | ORAL_CAPSULE | Freq: Three times a day (TID) | ORAL | 1 refills | Status: DC | PRN
Start: 2020-11-30 — End: 2021-05-10

## 2020-11-30 NOTE — Progress Notes (Signed)
Telemedicine Visit via  Video & Audio (App used: [email or web address])   I connected with Krista [name] on 11/30/20 at 2:46 PM  by phone or  telemedicine application as noted above  I verified that I am speaking with or regarding  the correct patient using two identifiers.  Participants: Myself, Dr Emeterio Reeve DO Patient: Krista [name] Patient proxy if applicable: none Other, if applicable: none  Patient is in separate location from myself  I am in office at [facility]    I discussed the limitations of evaluation and management  by telemedicine and the availability of in person appointments.  The participant(s) above expressed understanding and  agreed to proceed with this appointment via telemedicine.       History of Present Illness: Krista [name] is a 69 y.o. female who would like to discuss feeling sick. Coughing is the worse of her symptoms. Mucinex is only helping minimally[facility] are [facility]. Pt cannot tolerate codeine/opiates or prednisone.      Observations/Objective: Wt 135 lb (61.2 kg)   BMI 22.47 kg/m  BP Readings from Last 3 Encounters:  11/11/20 (!) 137/91  09/11/20 123/83  07/27/20 (!) 141/86   Exam: Normal Speech.  NAD, speaking in full sentences   Lab and Radiology Results No results found for this or any previous visit (from the past 72 hour(s)). No results found.     Assessment and Plan: 69 y.o. female with The primary encounter diagnosis was Cough. A diagnosis of Viral respiratory illness was also pertinent to this visit.   PDMP not reviewed this encounter. Orders Placed This Encounter  Procedures  . DG Chest 2 View    Order Specific Question:   Reason for Exam (SYMPTOM  OR DIAGNOSIS REQUIRED)    Answer:   cough    Order Specific Question:   Preferred imaging location?    Answer:   [facility]   Meds ordered this encounter  Medications  . benzonatate (TESSALON) 200 MG capsule     Sig: Take 1 capsule (200 mg total) by mouth 3 (three) times daily as needed.    Dispense:  30 capsule    Refill:  1  . albuterol (VENTOLIN HFA) 108 (90 Base) MCG/ACT inhaler    Sig: Inhale 2 puffs into the lungs every 6 (six) hours as needed for wheezing.    Dispense:  1 each    Refill:  0   Patient Instructions  Medications & Home Remedies for Respiratory Illness   Note: the following list assumes no pregnancy, normal liver & kidney function and no other drug interactions. Dr. Sheppard Coil has highlighted medications which are safe for you to use, but these may not be appropriate for everyone. Always ask a pharmacist or qualified medical provider if you have any questions!    Aches/Pains, Fever, Headache OTC Acetaminophen (Tylenol) 500 mg tablets - take max 2 tablets (1000 mg) every 6 hours (4 times per day)  OTC Ibuprofen (Motrin) 200 mg tablets - take max 4 tablets (800 mg) every 6 hours*   Sinus Congestion OTC Nasal Saline if desired to rinse OTC Oxymetolazone (Afrin, others) sparing use due to rebound congestion, NEVER use in kids OTC Phenylephrine (Sudafed) 10 mg tablets every 4 hours (or the 12-hour formulation)* OTC Diphenhydramine (Benadryl) 25 mg tablets - take max 2 tablets every 4 hours   Cough & Sore Throat OTC Dextromethorphan (Robitussin, others) - cough suppressant OTC Guaifenesin (Robitussin, Mucinex, others) - expectorant (helps cough up  mucus) (Dextromethorphan and Guaifenesin also come in a combination tablet/syrup) OTC Lozenges w/ Benzocaine + Menthol (Cepacol) Honey - as much as you want! Teas which "coat the throat" - look for ingredients Elm Bark, Licorice Root, Marshmallow Root   Other OTC Zinc Lozenges within 24 hours of symptoms onset - mixed evidence this shortens the duration of the common cold Don't waste your money on Vitamin C or Echinacea in acute illness - it's already too late!    *Caution in patients with high blood pressure      Instructions  sent via MyChart.   Follow Up Instructions: Return if symptoms worsen or fail to improve.    I discussed the assessment and treatment plan with the patient. The patient was provided an opportunity to ask questions and all were answered. The patient agreed with the plan and demonstrated an understanding of the instructions.   The patient was advised to call back or seek an in-person evaluation if any new concerns, if symptoms worsen or if the condition fails to improve as anticipated.  30 minutes of non-face-to-face time was provided during this encounter.      . . . . . . . . . . . . . Marland Kitchen                   Historical information moved to improve visibility of documentation.  Past Medical History:  Diagnosis Date  . Arthritis   . Bell palsy   . Brain aneurysm   . DDD (degenerative disc disease), cervical   . DDD (degenerative disc disease), lumbar   . Hyperlipidemia   . Hypertension   . Osteoporosis    Past Surgical History:  Procedure Laterality Date  . ABDOMINAL HYSTERECTOMY    . CEREBRAL ANEURYSM REPAIR    . EYE SURGERY     cararact  . FOOT SURGERY    . HAND SURGERY    . MASTOIDECTOMY Left    Social History   Tobacco Use  . Smoking status: Former Smoker    Quit date: 12/05/2008    Years since quitting: 11.9  . Smokeless tobacco: Never Used  Substance Use Topics  . Alcohol use: Yes    Comment: Rarely   family history includes Cancer in her brother and father; Heart failure in her mother.  Medications: Current Outpatient Medications  Medication Sig Dispense Refill  . albuterol (VENTOLIN HFA) 108 (90 Base) MCG/ACT inhaler Inhale 2 puffs into the lungs every 6 (six) hours as needed for wheezing. 1 each 0  . Calcium-Magnesium-Vitamin D (CITRACAL CALCIUM+D) 600-40-500 MG-MG-UNIT TB24 Take by mouth.    . cyclobenzaprine (FLEXERIL) 10 MG tablet TAKE 1/2 TO 1 TABLET BY MOUTH 3 TIMES A DAY AS NEEDED MUSCLE SPASMS 90 tablet 1  .  diphenhydramine-acetaminophen (TYLENOL PM) 25-500 MG TABS tablet Take 2 tablets by mouth at bedtime as needed.     . famotidine (PEPCID) 40 MG tablet TAKE 1 TABLET EVERY DAY 90 tablet 0  . hydrochlorothiazide (HYDRODIURIL) 12.5 MG tablet TAKE 1 TABLET (12.5 MG TOTAL) BY MOUTH DAILY. 90 tablet 1  . loratadine (CLARITIN) 10 MG tablet Take 10 mg by mouth daily.    . meloxicam (MOBIC) 15 MG tablet TAKE 1 TABLET BY MOUTH EVERY DAY IN THE MORNING WITH A MEAL AS NEEDED FOR PAIN 30 tablet 3  . pantoprazole (PROTONIX) 40 MG tablet TAKE 1 TABLET EVERY DAY 90 tablet 1  . pravastatin (PRAVACHOL) 40 MG tablet TAKE 1 TABLET (40 MG TOTAL) BY  MOUTH DAILY. 90 tablet 1  . acyclovir (ZOVIRAX) 400 MG tablet Take 1 tablet (400 mg total) by mouth 3 (three) times daily. (Patient not taking: Reported on 11/30/2020) 21 tablet 0  . benzonatate (TESSALON) 200 MG capsule Take 1 capsule (200 mg total) by mouth 3 (three) times daily as needed. 30 capsule 1  . gabapentin (NEURONTIN) 300 MG capsule One tab PO qHS for a week, then BID for a week, then TID. May double weekly to a max of 3,600mg /day (Patient not taking: Reported on 11/30/2020) 90 capsule 3  . ipratropium (ATROVENT) 0.06 % nasal spray Place 2 sprays into both nostrils 4 (four) times daily. (Patient not taking: Reported on 11/30/2020) 15 mL 4  . lidocaine (XYLOCAINE) 2 % solution Use as directed 15 mLs in the mouth or throat every 3 (three) hours as needed (mouth/throat pain - gargle and spit). (Patient not taking: Reported on 11/30/2020) 100 mL 1  . meclizine (ANTIVERT) 25 MG tablet Take by mouth. (Patient not taking: Reported on 11/30/2020)     No current facility-administered medications for this visit.   Allergies  Allergen Reactions  . Codeine Other (See Comments)    Hyperactivity "wired" super hyperactivity     . Kiwi Extract Hives  . Other Hives, Rash and Other (See Comments)    Nectarines--tongue swells   . Plum Pulp Hives  . Tape Hives and Rash  .  Tomato Hives  . Prednisone Other (See Comments)    Irritability

## 2020-11-30 NOTE — Patient Instructions (Addendum)
Medications & Home Remedies for Respiratory Illness   Note: the following list assumes no pregnancy, normal liver & kidney function and no other drug interactions. Dr. Lyn Hollingshead has highlighted medications which are safe for you to use, but these may not be appropriate for everyone. Always ask a pharmacist or qualified medical provider if you have any questions!    Aches/Pains, Fever, Headache OTC Acetaminophen (Tylenol) 500 mg tablets - take max 2 tablets (1000 mg) every 6 hours (4 times per day)  OTC Ibuprofen (Motrin) 200 mg tablets - take max 4 tablets (800 mg) every 6 hours*   Sinus Congestion OTC Nasal Saline if desired to rinse OTC Oxymetolazone (Afrin, others) sparing use due to rebound congestion, NEVER use in kids OTC Phenylephrine (Sudafed) 10 mg tablets every 4 hours (or the 12-hour formulation)* OTC Diphenhydramine (Benadryl) 25 mg tablets - take max 2 tablets every 4 hours   Cough & Sore Throat OTC Dextromethorphan (Robitussin, others) - cough suppressant OTC Guaifenesin (Robitussin, Mucinex, others) - expectorant (helps cough up mucus) (Dextromethorphan and Guaifenesin also come in a combination tablet/syrup) OTC Lozenges w/ Benzocaine + Menthol (Cepacol) Honey - as much as you want! Teas which "coat the throat" - look for ingredients Elm Bark, Licorice Root, Marshmallow Root   Other OTC Zinc Lozenges within 24 hours of symptoms onset - mixed evidence this shortens the duration of the common cold Don't waste your money on Vitamin C or Echinacea in acute illness - it's already too late!    *Caution in patients with high blood pressure

## 2020-12-01 ENCOUNTER — Telehealth: Payer: Medicare HMO | Admitting: Osteopathic Medicine

## 2020-12-02 ENCOUNTER — Ambulatory Visit (INDEPENDENT_AMBULATORY_CARE_PROVIDER_SITE_OTHER): Payer: Medicare HMO

## 2020-12-02 ENCOUNTER — Other Ambulatory Visit: Payer: Self-pay

## 2020-12-02 DIAGNOSIS — R059 Cough, unspecified: Secondary | ICD-10-CM | POA: Diagnosis not present

## 2020-12-03 DIAGNOSIS — M5030 Other cervical disc degeneration, unspecified cervical region: Secondary | ICD-10-CM | POA: Diagnosis not present

## 2020-12-03 DIAGNOSIS — M7918 Myalgia, other site: Secondary | ICD-10-CM | POA: Diagnosis not present

## 2020-12-03 DIAGNOSIS — M4726 Other spondylosis with radiculopathy, lumbar region: Secondary | ICD-10-CM | POA: Diagnosis not present

## 2020-12-03 DIAGNOSIS — M4802 Spinal stenosis, cervical region: Secondary | ICD-10-CM | POA: Diagnosis not present

## 2020-12-03 DIAGNOSIS — M4722 Other spondylosis with radiculopathy, cervical region: Secondary | ICD-10-CM | POA: Diagnosis not present

## 2020-12-03 DIAGNOSIS — G894 Chronic pain syndrome: Secondary | ICD-10-CM | POA: Diagnosis not present

## 2020-12-03 DIAGNOSIS — M5136 Other intervertebral disc degeneration, lumbar region: Secondary | ICD-10-CM | POA: Diagnosis not present

## 2020-12-03 DIAGNOSIS — G8929 Other chronic pain: Secondary | ICD-10-CM | POA: Diagnosis not present

## 2020-12-08 ENCOUNTER — Encounter: Payer: Self-pay | Admitting: Osteopathic Medicine

## 2020-12-23 ENCOUNTER — Other Ambulatory Visit: Payer: Self-pay | Admitting: Osteopathic Medicine

## 2020-12-30 DIAGNOSIS — G8929 Other chronic pain: Secondary | ICD-10-CM | POA: Diagnosis not present

## 2020-12-30 DIAGNOSIS — M4726 Other spondylosis with radiculopathy, lumbar region: Secondary | ICD-10-CM | POA: Diagnosis not present

## 2020-12-30 DIAGNOSIS — M7918 Myalgia, other site: Secondary | ICD-10-CM | POA: Diagnosis not present

## 2020-12-30 DIAGNOSIS — M4722 Other spondylosis with radiculopathy, cervical region: Secondary | ICD-10-CM | POA: Diagnosis not present

## 2020-12-30 DIAGNOSIS — M5030 Other cervical disc degeneration, unspecified cervical region: Secondary | ICD-10-CM | POA: Diagnosis not present

## 2020-12-30 DIAGNOSIS — G894 Chronic pain syndrome: Secondary | ICD-10-CM | POA: Diagnosis not present

## 2020-12-30 DIAGNOSIS — M5136 Other intervertebral disc degeneration, lumbar region: Secondary | ICD-10-CM | POA: Diagnosis not present

## 2020-12-30 DIAGNOSIS — M4802 Spinal stenosis, cervical region: Secondary | ICD-10-CM | POA: Diagnosis not present

## 2021-01-04 ENCOUNTER — Other Ambulatory Visit: Payer: Self-pay | Admitting: Sports Medicine

## 2021-01-04 ENCOUNTER — Ambulatory Visit (INDEPENDENT_AMBULATORY_CARE_PROVIDER_SITE_OTHER): Payer: Medicare HMO | Admitting: Sports Medicine

## 2021-01-04 ENCOUNTER — Other Ambulatory Visit: Payer: Self-pay

## 2021-01-04 DIAGNOSIS — M47816 Spondylosis without myelopathy or radiculopathy, lumbar region: Secondary | ICD-10-CM | POA: Diagnosis not present

## 2021-01-04 DIAGNOSIS — M5030 Other cervical disc degeneration, unspecified cervical region: Secondary | ICD-10-CM | POA: Diagnosis not present

## 2021-01-04 MED ORDER — TRIAZOLAM 0.25 MG PO TABS: ORAL_TABLET | ORAL | 0 refills | Status: DC

## 2021-01-04 MED ORDER — TRAMADOL HCL 50 MG PO TABS: 50.0000 mg | ORAL_TABLET | Freq: Three times a day (TID) | ORAL | 0 refills | Status: DC | PRN

## 2021-01-04 NOTE — Assessment & Plan Note (Signed)
Neetu did not respond to a lumbar epidural with Dr. Francesco Runner, she also has multilevel facet arthritis we are going to proceed with bilateral L3-S1 facet joint injections, Dr. Francesco Runner is out on medical leave so we will see if [facility] imaging can do this, triazolam for preprocedural sedation. Return to see me 1 month after injection.

## 2021-01-04 NOTE — Progress Notes (Signed)
    Procedures performed today:    None.  Independent interpretation of notes and tests performed by another provider:   None.  Brief History, Exam, Impression, and Recommendations:    DDD (degenerative disc disease), cervical At this point Breindel has had 2 cervical epidurals with Dr. Francesco Runner and is doing well with regards to her neck. Gabapentin at twice daily was too sedating so we will drop back down to nightly.  Lumbar spondylosis Jamiaya did not respond to a lumbar epidural with Dr. Francesco Runner, she also has multilevel facet arthritis we are going to proceed with bilateral L3-S1 facet joint injections, Dr. Francesco Runner is out on medical leave so we will see if [facility] imaging can do this, triazolam for preprocedural sedation. Return to see me 1 month after injection.    ___________________________________________ Gwen Her. Dianah Field, M.D., ABFM., CAQSM. Primary Care and [place] In[employer] [facility]

## 2021-01-04 NOTE — Assessment & Plan Note (Signed)
At this point Krista [name] has had 2 cervical epidurals with Dr. Francesco Runner and is doing well with regards to her neck. Gabapentin at twice daily was too sedating so we will drop back down to nightly.

## 2021-01-08 NOTE — Telephone Encounter (Signed)
Please place order.

## 2021-01-08 NOTE — Telephone Encounter (Signed)
I ordered them on January 31.

## 2021-01-11 NOTE — Telephone Encounter (Signed)
Call to [facility] at [place] to inquire about patient scheduling. She will call patient today.

## 2021-01-11 NOTE — Telephone Encounter (Signed)
I just reviewed her MRI again, the 2 bottom levels L4-L5 and L5-S1 look the worst so just let Krista [name] know to schedule bilateral L4-S1 facet joint injections.  Please let me know if I need to place the orders again, I would be happy to do so.  Otherwise she can just take a verbal order.

## 2021-01-12 DIAGNOSIS — R112 Nausea with vomiting, unspecified: Secondary | ICD-10-CM | POA: Diagnosis not present

## 2021-01-12 DIAGNOSIS — K52832 Lymphocytic colitis: Secondary | ICD-10-CM | POA: Diagnosis not present

## 2021-01-12 DIAGNOSIS — G510 Bell's palsy: Secondary | ICD-10-CM | POA: Diagnosis not present

## 2021-01-13 ENCOUNTER — Other Ambulatory Visit: Payer: Self-pay | Admitting: Sports Medicine

## 2021-01-13 DIAGNOSIS — M47816 Spondylosis without myelopathy or radiculopathy, lumbar region: Secondary | ICD-10-CM

## 2021-01-14 DIAGNOSIS — I671 Cerebral aneurysm, nonruptured: Secondary | ICD-10-CM | POA: Diagnosis not present

## 2021-01-18 ENCOUNTER — Ambulatory Visit
Admission: RE | Admit: 2021-01-18 | Discharge: 2021-01-18 | Disposition: A | Discharge status: Home / Self Care | Payer: Medicare HMO | Source: Ambulatory Visit | Attending: Sports Medicine | Admitting: Sports Medicine

## 2021-01-18 ENCOUNTER — Other Ambulatory Visit: Payer: Self-pay

## 2021-01-18 DIAGNOSIS — M47816 Spondylosis without myelopathy or radiculopathy, lumbar region: Secondary | ICD-10-CM

## 2021-01-18 DIAGNOSIS — M47817 Spondylosis without myelopathy or radiculopathy, lumbosacral region: Secondary | ICD-10-CM | POA: Diagnosis not present

## 2021-01-18 MED ORDER — IOPAMIDOL (ISOVUE-M 200) INJECTION 41%
1.0000 mL | Freq: Once | INTRAMUSCULAR | Status: AC
  Administered 2021-01-18: 1 mL via EPIDURAL

## 2021-01-18 MED ORDER — METHYLPREDNISOLONE ACETATE 40 MG/ML INJ SUSP (RADIOLOG
120.0000 mg | Freq: Once | INTRAMUSCULAR | Status: AC
  Administered 2021-01-18: 120 mg via EPIDURAL

## 2021-01-18 NOTE — Discharge Instructions (Signed)

## 2021-01-19 ENCOUNTER — Other Ambulatory Visit: Payer: Self-pay | Admitting: Osteopathic Medicine

## 2021-01-23 ENCOUNTER — Other Ambulatory Visit: Payer: Self-pay | Admitting: Osteopathic Medicine

## 2021-01-27 DIAGNOSIS — I10 Essential (primary) hypertension: Secondary | ICD-10-CM | POA: Diagnosis not present

## 2021-01-27 DIAGNOSIS — I671 Cerebral aneurysm, nonruptured: Secondary | ICD-10-CM | POA: Diagnosis not present

## 2021-02-11 ENCOUNTER — Ambulatory Visit (INDEPENDENT_AMBULATORY_CARE_PROVIDER_SITE_OTHER): Payer: Medicare HMO | Admitting: Sports Medicine

## 2021-02-11 ENCOUNTER — Other Ambulatory Visit: Payer: Self-pay

## 2021-02-11 DIAGNOSIS — M47816 Spondylosis without myelopathy or radiculopathy, lumbar region: Secondary | ICD-10-CM

## 2021-02-11 MED ORDER — TRAMADOL HCL 50 MG PO TABS: 25.0000 mg | ORAL_TABLET | Freq: Three times a day (TID) | ORAL | 0 refills | Status: DC | PRN

## 2021-02-11 MED ORDER — CYCLOBENZAPRINE HCL 10 MG PO TABS: ORAL_TABLET | ORAL | 3 refills | Status: DC

## 2021-02-11 NOTE — Assessment & Plan Note (Signed)
Krista [name] is a pleasant 70 year old female with multiple pain generators in her lumbar spine. She did have a lumbar epidural which did not provide any relief. She has central canal stenosis at L4-L5, she also has multilevel facet arthritis, we recently proceeded with bilateral L4 S1 facet joint injections, she had 2 days of near complete relief and a slight recurrence of her pain. Flexeril 3 times daily seems to work well, gabapentin 300 at night works well, tramadol was a bit too strong at 50 mg so she will do one half tab as needed for breakthrough pain. At this point we can leave things alone but if pain recurs we will proceed with facet radiofrequency ablation.

## 2021-02-11 NOTE — Progress Notes (Signed)
    Procedures performed today:    None.  Independent interpretation of notes and tests performed by another provider:   None.  Brief History, Exam, Impression, and Recommendations:    Lumbar spondylosis Krista [name] is a pleasant 70 year old female with multiple pain generators in her lumbar spine. She did have a lumbar epidural which did not provide any relief. She has central canal stenosis at L4-L5, she also has multilevel facet arthritis, we recently proceeded with bilateral L4 S1 facet joint injections, she had 2 days of near complete relief and a slight recurrence of her pain. Flexeril 3 times daily seems to work well, gabapentin 300 at night works well, tramadol was a bit too strong at 50 mg so she will do one half tab as needed for breakthrough pain. At this point we can leave things alone but if pain recurs we will proceed with facet radiofrequency ablation.    ___________________________________________ Gwen Her. Dianah Field, M.D., ABFM., CAQSM. Primary Care and [place] Instructor of [place] [facility]

## 2021-02-18 ENCOUNTER — Other Ambulatory Visit: Payer: Self-pay | Admitting: Osteopathic Medicine

## 2021-02-18 DIAGNOSIS — M6740 Ganglion, unspecified site: Secondary | ICD-10-CM

## 2021-03-10 ENCOUNTER — Encounter: Payer: Self-pay | Admitting: Osteopathic Medicine

## 2021-03-11 NOTE — Telephone Encounter (Signed)
Not seen in greater than 3 months and requesting treatment for worsening/not resolving problem, requires visit in office

## 2021-03-12 NOTE — Telephone Encounter (Signed)
Left voicemail for patient to call us back.

## 2021-03-16 ENCOUNTER — Ambulatory Visit (INDEPENDENT_AMBULATORY_CARE_PROVIDER_SITE_OTHER): Payer: Medicare HMO | Admitting: Osteopathic Medicine

## 2021-03-16 ENCOUNTER — Encounter: Payer: Self-pay | Admitting: Osteopathic Medicine

## 2021-03-16 ENCOUNTER — Other Ambulatory Visit: Payer: Self-pay

## 2021-03-16 VITALS — BP 127/58 | HR 89 | Wt 135.0 lb

## 2021-03-16 DIAGNOSIS — K121 Other forms of stomatitis: Secondary | ICD-10-CM

## 2021-03-16 DIAGNOSIS — B001 Herpesviral vesicular dermatitis: Secondary | ICD-10-CM | POA: Diagnosis not present

## 2021-03-16 MED ORDER — LIDOCAINE VISCOUS HCL 2 % MT SOLN: 10.0000 mL | OROMUCOSAL | 1 refills | Status: DC | PRN

## 2021-03-16 MED ORDER — VALACYCLOVIR HCL 1 G PO TABS: ORAL_TABLET | ORAL | 3 refills | Status: AC

## 2021-03-16 NOTE — Progress Notes (Signed)
Krista [name] is a 70 y.o. female who presents to  [place] at [facility]  today, 03/16/21, seeking care for the following:  . MOUTH ULCERS - ongoing issue over past year but worse in last 2 weeks, hurting to swallow. Warm salt water gargles and OTC Orajel not helping. Rx previously for Bottom lip swollen. Hx cold sores, Valtrex prn last ordered 11/2019, was also on Acyclovir 05/2020, 07/2020, 08/2020 - can't find any notes justifying these refills other than automatically refilled per protocol in response to patient request      ASSESSMENT & PLAN with other pertinent findings:  The primary encounter diagnosis was Oral ulcer. A diagnosis of Cold sore was also pertinent to this visit.   Oral ulcers c/w HSV lesions. Mild lymphadenopathy L anterior cervical near ankle of mandible but on exam pharynx I can't visualize any mass. Normal speech --> burst Valtrex and transition to suppressive therapy, lidocaine for pain control   There are no Patient Instructions on file for this visit.  No orders of the defined types were placed in this encounter.   Meds ordered this encounter  Medications  . valACYclovir (VALTREX) 1000 MG tablet    Sig: Take 2 tablets (2,000 mg total) by mouth 2 (two) times daily for 1 day, THEN 1 tablet (1,000 mg total) daily.    Dispense:  90 tablet    Refill:  3  . lidocaine (XYLOCAINE) 2 % solution    Sig: Use as directed 10-15 mLs in the mouth or throat every 3 (three) hours as needed (mouth/throat pain).    Dispense:  100 mL    Refill:  1     See below for relevant physical exam findings  See below for recent lab and imaging results reviewed  Medications, allergies, PMH, PSH, SocH, FamH reviewed below    Follow-up instructions: Return if symptoms worsen or fail to improve.                                        Exam:  BP (!) 127/58   Pulse 89   Wt 135 lb (61.2 kg)   SpO2  100%   BMI 22.47 kg/m   Constitutional: VS see above. General Appearance: alert, well-developed, well-nourished, NAD  HEENT: see above.   Respiratory: Normal respiratory effort.  Musculoskeletal: Gait normal.  Neurological: Normal balance/coordination. No tremor.  Skin: warm, dry, intact.   Psychiatric: Normal judgment/insight. Normal mood and affect. Oriented x3.   Current Meds  Medication Sig  . albuterol (VENTOLIN HFA) 108 (90 Base) MCG/ACT inhaler INHALE 2 PUFFS INTO THE LUNGS EVERY 6 HOURS AS NEEDED FOR WHEEZE  . benzonatate (TESSALON) 200 MG capsule Take 1 capsule (200 mg total) by mouth 3 (three) times daily as needed.  . Calcium-Magnesium-Vitamin D (CITRACAL CALCIUM+D) 024-09-735 MG-MG-UNIT TB24 Take by mouth.  . cyclobenzaprine (FLEXERIL) 10 MG tablet TAKE 1/2 TO 1 TABLET BY MOUTH 3 TIMES A DAY AS NEEDED MUSCLE SPASMS  . diphenhydramine-acetaminophen (TYLENOL PM) 25-500 MG TABS tablet Take 2 tablets by mouth at bedtime as needed.   . famotidine (PEPCID) 40 MG tablet Take 1 tablet (40 mg total) by mouth daily. **PATIENT NEEDS OFFICE VISIT FOR ADDITIONAL REFILLS**  . gabapentin (NEURONTIN) 300 MG capsule Take 1 capsule (300 mg total) by mouth at bedtime.  . hydrochlorothiazide (HYDRODIURIL) 12.5 MG tablet TAKE 1 TABLET (12.5 MG TOTAL)  BY MOUTH DAILY.  Marland Kitchen lidocaine (XYLOCAINE) 2 % solution Use as directed 10-15 mLs in the mouth or throat every 3 (three) hours as needed (mouth/throat pain).  Marland Kitchen loratadine (CLARITIN) 10 MG tablet Take 10 mg by mouth daily.  . magnesium oxide (MAG-OX) 400 MG tablet Take 2 tablets (800 mg total) by mouth daily.  . meloxicam (MOBIC) 15 MG tablet TAKE 1 TABLET EVERY MORNING WITH FOOD  . pravastatin (PRAVACHOL) 40 MG tablet TAKE 1 TABLET (40 MG TOTAL) BY MOUTH DAILY.  . valACYclovir (VALTREX) 1000 MG tablet Take 2 tablets (2,000 mg total) by mouth 2 (two) times daily for 1 day, THEN 1 tablet (1,000 mg total) daily.    Allergies  Allergen Reactions   . Codeine Other (See Comments)    Hyperactivity "wired" super hyperactivity     . Kiwi Extract Hives  . Other Hives, Rash and Other (See Comments)    Nectarines--tongue swells   . Plum Pulp Hives  . Tape Hives and Rash  . Tomato Hives  . Prednisone Other (See Comments)    Irritability      Patient Active Problem List   Diagnosis Date Noted  . Osteoporosis 11/05/2020  . DDD (degenerative disc disease), cervical 07/06/2020  . Lumbar spondylosis 05/06/2020  . Bilateral wrist pain 05/06/2020  . Inflammatory arthritis 03/13/2020  . Osteoarthritis of left little finger 07/31/2018  . Acute bronchitis with COPD ([place]) 05/10/2018  . Impacted cerumen of left ear 03/07/2018  . History of left mastoidectomy 03/07/2018  . Tympanosclerosis, left ear 03/07/2018  . Cardiac risk counseling 09/05/2017  . Essential hypertension 09/05/2017  . Mixed hyperlipidemia 09/05/2017  . History of Bell's palsy 08/15/2017  . History of osteoporosis 08/15/2017  . History of colon polyps 08/15/2017  . Diarrhea 08/15/2017  . History of nonmelanoma skin cancer 08/15/2017  . Brain aneurysm 08/15/2017  . Bilateral hearing loss 08/15/2017  . H/O hysterectomy for benign disease 08/15/2017    Family History  Problem Relation Age of Onset  . Heart failure Mother   . Cancer Father   . Cancer Brother     Social History   Tobacco Use  Smoking Status Former Smoker  . Quit date: 12/05/2008  . Years since quitting: 12.2  Smokeless Tobacco Never Used    Past Surgical History:  Procedure Laterality Date  . ABDOMINAL HYSTERECTOMY    . CEREBRAL ANEURYSM REPAIR    . EYE SURGERY     cararact  . FOOT SURGERY    . HAND SURGERY    . MASTOIDECTOMY Left     Immunization History  Administered Date(s) Administered  . Fluad Quad(high Dose 65+) 08/23/2019  . Influenza, High Dose Seasonal PF 08/15/2017, 08/24/2018, 09/04/2018  . Influenza-Unspecified 08/23/2019, 08/01/2020  . PFIZER(Purple Top)SARS-COV-2  Vaccination 02/11/2020, 08/01/2020, 12/01/2020  . Pneumococcal Conjugate-13 09/11/2020  . Pneumococcal Polysaccharide-23 09/03/2019  . Tdap 06/20/2014  . Zoster Recombinat (Shingrix) 12/01/2020    No results found for this or any previous visit (from the past 2160 hour(s)).  No results found.     All questions at time of visit were answered - patient instructed to contact office with any additional concerns or updates. ER/RTC precautions were reviewed with the patient as applicable.   Please note: manual typing as well as voice recognition software may have been used to produce this document - typos may escape review. Please contact Dr. Sheppard Coil for any needed clarifications.

## 2021-04-07 ENCOUNTER — Other Ambulatory Visit: Payer: Self-pay | Admitting: Osteopathic Medicine

## 2021-04-09 ENCOUNTER — Other Ambulatory Visit: Payer: Self-pay | Admitting: Osteopathic Medicine

## 2021-04-16 ENCOUNTER — Other Ambulatory Visit: Payer: Self-pay

## 2021-04-16 ENCOUNTER — Ambulatory Visit (INDEPENDENT_AMBULATORY_CARE_PROVIDER_SITE_OTHER): Payer: Medicare HMO | Admitting: Sports Medicine

## 2021-04-16 DIAGNOSIS — M47816 Spondylosis without myelopathy or radiculopathy, lumbar region: Secondary | ICD-10-CM

## 2021-04-16 MED ORDER — TRAMADOL HCL 50 MG PO TABS: 25.0000 mg | ORAL_TABLET | Freq: Three times a day (TID) | ORAL | 0 refills | Status: DC | PRN

## 2021-04-16 NOTE — Assessment & Plan Note (Addendum)
Krista [name] returns, she is a very pleasant 70 year old female with multifactorial lumbar spine pain with multiple pain generators. To recap, we did a lumbar epidural which provided no relief, she did of course have some central canal stenosis at L4-L5. She also had multilevel lumbar facet arthritis so we proceeded with bilateral L4-S1 facet joint injections back in February. She had 2 days of near complete relief and long-term good relief until now. Flexeril 3 times daily seems to help, gabapentin at night works well, tramadol at 25 mg as needed seems to be helpful as well, she does take meloxicam daily. Considering the recurrence of pain and the desire to decrease her medication usage we will go ahead and set her up with Dr. Francesco Runner for consideration of bilateral L4-S1 facet medial branch blocks followed by radiofrequency ablation if successful. Return to see me 1 month after the RFA.  For auditor purposes this is a chronic disease process with exacerbation and rx pharmacologic management.

## 2021-04-16 NOTE — Progress Notes (Signed)
    Procedures performed today:    None.  Independent interpretation of notes and tests performed by another provider:   None.  Brief History, Exam, Impression, and Recommendations:    Facet arthritis of lumbar region Krista [name] returns, she is a very pleasant 70 year old female with multifactorial lumbar spine pain with multiple pain generators. To recap, we did a lumbar epidural which provided no relief, she did of course have some central canal stenosis at L4-L5. She also had multilevel lumbar facet arthritis so we proceeded with bilateral L4-S1 facet joint injections back in February. She had 2 days of near complete relief and long-term good relief until now. Flexeril 3 times daily seems to help, gabapentin at night works well, tramadol at 25 mg as needed seems to be helpful as well, she does take meloxicam daily. Considering the recurrence of pain and the desire to decrease her medication usage we will go ahead and set her up with Dr. Francesco Runner for consideration of bilateral L4-S1 facet medial branch blocks followed by radiofrequency ablation if successful. Return to see me 1 month after the RFA.  For auditor purposes this is a chronic disease process with exacerbation and rx pharmacologic management.    ___________________________________________ Gwen Her. Dianah Field, M.D., ABFM., CAQSM. Primary Care and [place] Instructor of [place] [facility]

## 2021-04-20 ENCOUNTER — Other Ambulatory Visit: Payer: Self-pay

## 2021-04-20 ENCOUNTER — Telehealth: Payer: Self-pay | Admitting: Osteopathic Medicine

## 2021-04-20 ENCOUNTER — Encounter: Payer: Self-pay | Admitting: Family Medicine

## 2021-04-20 ENCOUNTER — Ambulatory Visit (INDEPENDENT_AMBULATORY_CARE_PROVIDER_SITE_OTHER): Payer: Medicare HMO | Admitting: Family Medicine

## 2021-04-20 VITALS — BP 149/80 | HR 98 | Temp 97.5°F | Resp 17 | Wt 136.8 lb

## 2021-04-20 DIAGNOSIS — K121 Other forms of stomatitis: Secondary | ICD-10-CM

## 2021-04-20 MED ORDER — MAGIC MOUTHWASH W/LIDOCAINE
10.0000 mL | Freq: Four times a day (QID) | ORAL | 1 refills | Status: DC
Start: 2021-04-20 — End: 2021-04-23

## 2021-04-20 NOTE — Progress Notes (Signed)
Acute Office Visit  Subjective:    Patient ID: Krista [name], female    DOB: 1951-12-04, 70 y.o.   MRN: [id number]  Chief Complaint  Patient presents with  . Mouth Lesions    HPI Patient is in today for recurrent oral ulcers.  She states she has had oral ulcers almost constantly for the past year, with the past 2 weeks being the worst.   She has tried valtrex and acyclovir with no real improvement. Lidocaine and salt water rinses help temporarily, but as soon as an ulcer resolves, another comes up. She typically has several in her mouth at one time. Over the past 2 weeks she has started getting them on the top of her tongue and hard palate, where as usually they were just buccal or in her lips. They tend to start as small blisters and then ulcerate. The one behind her two front teeth is the most painful (7-8/10) making it difficult to eat. She avoids acidic foods/drinks, carbonated drinks. Denies any systemic symptoms. No fevers, malaise, no other rashes or ulcerations on other parts of the body. No other history of HSV. She does have chronic dry mouth.   Current locations of ulcers: Bilateral inner lower lip Center upper palate behind front teeth 2 on tip of tongue - almost resolved Inside left cheek       Past Medical History:  Diagnosis Date  . Arthritis   . Bell palsy   . Brain aneurysm   . DDD (degenerative disc disease), cervical   . DDD (degenerative disc disease), lumbar   . Hyperlipidemia   . Hypertension   . Osteoporosis     Past Surgical History:  Procedure Laterality Date  . ABDOMINAL HYSTERECTOMY    . CEREBRAL ANEURYSM REPAIR    . EYE SURGERY     cararact  . FOOT SURGERY    . HAND SURGERY    . MASTOIDECTOMY Left     Family History  Problem Relation Age of Onset  . Heart failure Mother   . Cancer Father   . Cancer Brother     Social History   Socioeconomic History  . Marital status: Married    Spouse name: Randal  . Number of children: 2   . Years of education: 72  . Highest education level: 12th grade  Occupational History  . Occupation: retired    Comment: Retail buyer  Tobacco Use  . Smoking status: Former Smoker    Quit date: 12/05/2008    Years since quitting: 12.3  . Smokeless tobacco: Never Used  Vaping Use  . Vaping Use: Never used  Substance and Sexual Activity  . Alcohol use: Yes    Comment: Rarely  . Drug use: No  . Sexual activity: Not Currently  Other Topics Concern  . Not on file  Social History Narrative   Walks 3 times a week   Social Determinants of Health   Financial Resource Strain: Not on file  Food Insecurity: Not on file  Transportation Needs: Not on file  Physical Activity: Not on file  Stress: Not on file  Social Connections: Not on file  Intimate Partner Violence: Not on file    Outpatient Medications Prior to Visit  Medication Sig Dispense Refill  . albuterol (VENTOLIN HFA) 108 (90 Base) MCG/ACT inhaler INHALE 2 PUFFS INTO THE LUNGS EVERY 6 HOURS AS NEEDED FOR WHEEZE 6.7 each 0  . benzonatate (TESSALON) 200 MG capsule Take 1 capsule (200 mg total) by mouth 3 (three)  times daily as needed. 30 capsule 1  . Calcium-Magnesium-Vitamin D (CITRACAL CALCIUM+D) 600-40-500 MG-MG-UNIT TB24 Take by mouth.    . cholecalciferol (VITAMIN D3) 25 MCG (1000 UNIT) tablet Take 1,000 Units by mouth daily.    . cyclobenzaprine (FLEXERIL) 10 MG tablet TAKE 1/2 TO 1 TABLET BY MOUTH 3 TIMES A DAY AS NEEDED MUSCLE SPASMS 270 tablet 3  . diphenhydramine-acetaminophen (TYLENOL PM) 25-500 MG TABS tablet Take 2 tablets by mouth at bedtime as needed.     . famotidine (PEPCID) 40 MG tablet TAKE 1 TABLET DAILY. PATIENT NEEDS OFFICE VISIT FOR ADDITIONAL REFILLS. 90 tablet 1  . gabapentin (NEURONTIN) 300 MG capsule Take 1 capsule (300 mg total) by mouth at bedtime.    . hydrochlorothiazide (HYDRODIURIL) 12.5 MG tablet TAKE 1 TABLET (12.5 MG TOTAL) BY MOUTH DAILY. 90 tablet 1  . lidocaine (XYLOCAINE) 2 %  solution Use as directed 10-15 mLs in the mouth or throat every 3 (three) hours as needed (mouth/throat pain). 100 mL 1  . loratadine (CLARITIN) 10 MG tablet Take 10 mg by mouth daily.    . LUTEIN ESTERS PO Take by mouth.    . magnesium oxide (MAG-OX) 400 MG tablet Take 2 tablets (800 mg total) by mouth daily. 180 tablet 3  . meloxicam (MOBIC) 15 MG tablet TAKE 1 TABLET EVERY MORNING WITH FOOD 90 tablet 0  . pravastatin (PRAVACHOL) 40 MG tablet TAKE 1 TABLET (40 MG TOTAL) BY MOUTH DAILY. 90 tablet 1  . traMADol (ULTRAM) 50 MG tablet Take 0.5-1 tablets (25-50 mg total) by mouth every 8 (eight) hours as needed for moderate pain. Maximum 6 tabs per day. 21 tablet 0  . valACYclovir (VALTREX) 1000 MG tablet Take 2 tablets (2,000 mg total) by mouth 2 (two) times daily for 1 day, THEN 1 tablet (1,000 mg total) daily. 90 tablet 3   No facility-administered medications prior to visit.    Allergies  Allergen Reactions  . Codeine Other (See Comments)    Hyperactivity "wired" super hyperactivity     . Kiwi Extract Hives  . Other Hives, Rash and Other (See Comments)    Nectarines--tongue swells   . Plum Pulp Hives  . Tape Hives and Rash  . Tomato Hives  . Prednisone Other (See Comments)    Irritability      Review of Systems All review of systems negative except what is listed in the HPI     Objective:    Physical Exam Constitutional:      Appearance: Normal appearance. She is normal weight.  HENT:     Head: Normocephalic and atraumatic.     Mouth/Throat:     Comments: Multiple oral lesions - see pictures below Skin:    General: Skin is warm and dry.     Findings: No erythema or rash.  Neurological:     General: No focal deficit present.     Mental Status: She is alert and oriented to person, place, and time.  Psychiatric:        Mood and Affect: Mood normal.        Behavior: Behavior normal.        Thought Content: Thought content normal.        Judgment: Judgment normal.                 BP (!) 149/80   Pulse 98   Temp (!) 97.5 F (36.4 C)   Resp 17   Wt 136 lb 12.8 oz (62.1 kg)  SpO2 97%   BMI 22.76 kg/m  Wt Readings from Last 3 Encounters:  04/20/21 136 lb 12.8 oz (62.1 kg)  03/16/21 135 lb (61.2 kg)  11/30/20 135 lb (61.2 kg)    There are no preventive care reminders to display for this patient.  There are no preventive care reminders to display for this patient.   Lab Results  Component Value Date   TSH 0.71 08/17/2017   Lab Results  Component Value Date   WBC 5.7 08/02/2019   HGB 12.8 08/02/2019   HCT 38.7 08/02/2019   MCV 95.1 08/02/2019   PLT 248 08/02/2019   Lab Results  Component Value Date   NA 141 11/05/2020   K 4.2 11/05/2020   CO2 25 11/05/2020   GLUCOSE 88 11/05/2020   BUN 10 11/05/2020   CREATININE 0.85 11/05/2020   BILITOT 0.5 08/02/2019   AST 20 08/02/2019   ALT 14 08/02/2019   PROT 7.0 08/02/2019   CALCIUM 9.8 11/05/2020   Lab Results  Component Value Date   CHOL 249 (H) 08/02/2019   Lab Results  Component Value Date   HDL 68 08/02/2019   Lab Results  Component Value Date   LDLCALC 150 (H) 08/02/2019   Lab Results  Component Value Date   TRIG 168 (H) 08/02/2019   Lab Results  Component Value Date   CHOLHDL 3.7 08/02/2019   No results found for: HGBA1C     Assessment & Plan:   1. Oral ulcer Will swab ulcer today to see if this is HSV related. Also giving some magic mouthwash for symptoms management and recommend she stay hydrated and try supportive measures for dry mouth such as biotin mouthwash and B12 supplements. Will consider further workup pending results. Could consider Bechet's although no other related clinical findings at this time. Aware of signs and symptoms requiring further evaluation.  - magic mouthwash w/lidocaine SOLN; Take 10 mLs by mouth 4 (four) times daily. Swish for 2 minutes then spit out.  Dispense: 500 mL; Refill: 1 - Herpes simplex virus  culture  Follow-up if symptoms worsen or fail to improve.   Terrilyn Saver, NP

## 2021-04-20 NOTE — Progress Notes (Signed)
  Chronic Care Management   Outreach Note  04/20/2021 Name: Krista [name] MRN: [id number] DOB: 09-05-1951  Referred by: Emeterio Reeve, DO Reason for referral : No chief complaint on file.   An unsuccessful telephone outreach was attempted today. The patient was referred to the pharmacist for assistance with care management and care coordination.   Follow Up Plan:   [name] Upstream Scheduler

## 2021-04-22 ENCOUNTER — Telehealth: Payer: Self-pay | Admitting: Osteopathic Medicine

## 2021-04-22 ENCOUNTER — Encounter: Payer: Self-pay | Admitting: Osteopathic Medicine

## 2021-04-22 LAB — HERPES SIMPLEX VIRUS CULTURE
MICRO NUMBER:: 11902226
SPECIMEN QUALITY:: ADEQUATE

## 2021-04-22 NOTE — Chronic Care Management (AMB) (Signed)
  Chronic Care Management   Note  04/22/2021 Name: Krista [name] MRN: [id number] DOB: [birth year]  Krista [name] is a 70 y.o. year old female who is a primary care patient of Emeterio Reeve, DO. I reached out to [name] by phone today in response to a referral sent by [name]'s PCP, Emeterio Reeve, DO.   [name] was given information about Chronic Care Management services today including:  1. CCM service includes personalized support from designated clinical staff supervised by her physician, including individualized plan of care and coordination with other care providers 2. 24/7 contact phone numbers for assistance for urgent and routine care needs. 3. Service will only be billed when office clinical staff spend 20 minutes or more in a month to coordinate care. 4. Only one practitioner may furnish and bill the service in a calendar month. 5. The patient may stop CCM services at any time (effective at the end of the month) by phone call to the office staff.   Patient agreed to services and verbal consent obtained.   Follow up plan:   [name] Upstream Scheduler

## 2021-04-23 MED ORDER — MAGIC MOUTHWASH W/LIDOCAINE: ORAL | 0 refills | Status: DC

## 2021-04-27 ENCOUNTER — Other Ambulatory Visit: Payer: Self-pay | Admitting: *Deleted

## 2021-04-27 MED ORDER — MAGIC MOUTHWASH W/LIDOCAINE: ORAL | 0 refills | Status: DC

## 2021-04-29 ENCOUNTER — Other Ambulatory Visit: Payer: Self-pay

## 2021-04-29 MED ORDER — MAGIC MOUTHWASH W/LIDOCAINE: ORAL | 0 refills | Status: DC

## 2021-05-05 NOTE — Telephone Encounter (Signed)
Concerns about a headache, forwarding to PCP.

## 2021-05-06 NOTE — Telephone Encounter (Signed)
If patient concerned about headache symptoms, needs appt If she's asking about a procedure that sports med is aranging, will need to chat about this w/ Dr Darene Lamer

## 2021-05-08 DIAGNOSIS — Z8616 Personal history of COVID-19: Secondary | ICD-10-CM

## 2021-05-08 DIAGNOSIS — Z20822 Contact with and (suspected) exposure to covid-19: Secondary | ICD-10-CM | POA: Diagnosis not present

## 2021-05-08 HISTORY — DX: Personal history of COVID-19: Z86.16

## 2021-05-10 ENCOUNTER — Telehealth (INDEPENDENT_AMBULATORY_CARE_PROVIDER_SITE_OTHER): Payer: Medicare HMO | Admitting: Family Medicine

## 2021-05-10 ENCOUNTER — Encounter: Payer: Self-pay | Admitting: Osteopathic Medicine

## 2021-05-10 ENCOUNTER — Other Ambulatory Visit (HOSPITAL_BASED_OUTPATIENT_CLINIC_OR_DEPARTMENT_OTHER): Payer: Self-pay

## 2021-05-10 ENCOUNTER — Encounter: Payer: Self-pay | Admitting: Family Medicine

## 2021-05-10 DIAGNOSIS — U071 COVID-19: Secondary | ICD-10-CM | POA: Diagnosis not present

## 2021-05-10 MED ORDER — AZELASTINE HCL 0.1 % NA SOLN
2.0000 | Freq: Two times a day (BID) | NASAL | 1 refills | Status: DC
  Filled 2021-05-10: qty 30, 25d supply, fill #0

## 2021-05-10 MED ORDER — BENZONATATE 200 MG PO CAPS
200.0000 mg | ORAL_CAPSULE | Freq: Three times a day (TID) | ORAL | 1 refills | Status: DC | PRN
  Filled 2021-05-10: qty 30, 10d supply, fill #0

## 2021-05-10 MED ORDER — GUAIFENESIN ER 600 MG PO TB12
1200.0000 mg | ORAL_TABLET | Freq: Two times a day (BID) | ORAL | 2 refills | Status: DC
  Filled 2021-05-10: qty 40, 10d supply, fill #0

## 2021-05-10 MED ORDER — MOLNUPIRAVIR EUA 200MG CAPSULE
4.0000 | ORAL_CAPSULE | Freq: Two times a day (BID) | ORAL | 0 refills | Status: AC
  Filled 2021-05-10: qty 40, 5d supply, fill #0

## 2021-05-10 NOTE — Progress Notes (Signed)
Virtual Video Visit via MyChart Note  I connected with  [name] on 05/10/21 at  1:00 PM EDT by the video enabled telemedicine application for MyChart, and verified that I am speaking with the correct person using two identifiers.   I introduced myself as a Designer, jewellery with the practice. We discussed the limitations of evaluation and management by telemedicine and the availability of in person appointments. The patient expressed understanding and agreed to proceed.  Participating parties in this visit include: The patient and the nurse practitioner listed.  The patient is: At home I am: In the office - [facility]  Subjective:    CC:  Chief Complaint  Patient presents with  . Covid Positive    HPI: Krista [name] is a 70 y.o. year old female presenting today via [place] today for positive COVID test.  About 5-6 days ago, patient started gradually feeling bad and symptoms have continued to worsen. She is reporting headache, sinus/chest/nasal congestion, cough (making it difficult to sleep), fatigue, poor appetite. She denies chest pain, shortness of breath, wheezing, dizziness, confusion, GI/GU symptoms. Positive COVID test on 05/08/21. So far she has only been taking tylenol. She is fully vaccinated plus booster, and is wanting to try antiviral options.      Past medical history, Surgical history, Family history not pertinant except as noted below, Social history, Allergies, and medications have been entered into the medical record, reviewed, and corrections made.   Review of Systems:  All review of systems negative except what is listed in the HPI   Objective:    General:  Speaking clearly in complete sentences. Absent shortness of breath noted.   Alert and oriented x3.   Normal judgment.  Absent acute distress.   Impression and Recommendations:     1. COVID-19 Patient borderline for window of antiviral, but would like to still try. Attaching  handout to AVS with link to molnupiravir fact sheet. Also sending in benzonatate refill for cough, Mucinex, and nasal spray. Continue supportive measures including OTC cough/cold/analgesics. Patient aware of signs/symptoms requiring further/urgent evaluation.   - molnupiravir EUA 200 mg CAPS; Take 4 capsules (800 mg total) by mouth 2 (two) times daily for 5 days.  Dispense: 40 capsule; Refill: 0 - guaiFENesin (MUCINEX) 600 MG 12 hr tablet; Take 2 tablets (1,200 mg total) by mouth 2 (two) times daily.  Dispense: 30 tablet; Refill: 2 - azelastine (ASTELIN) 0.1 % nasal spray; Place 2 sprays into both nostrils 2 (two) times daily. Use in each nostril as directed  Dispense: 301 mL; Refill: 1 - benzonatate (TESSALON) 200 MG capsule; Take 1 capsule (200 mg total) by mouth 3 (three) times daily as needed.  Dispense: 30 capsule; Refill: 1   Follow-up if symptoms worsen or fail to improve.    I discussed the assessment and treatment plan with the patient. The patient was provided an opportunity to ask questions and all were answered. The patient agreed with the plan and demonstrated an understanding of the instructions.   The patient was advised to call back or seek an in-person evaluation if the symptoms worsen or if the condition fails to improve as anticipated.  I provided 20 minutes of non-face-to-face interaction with this [place] visit including intake, same-day documentation, and chart review.   Terrilyn Saver, NP

## 2021-05-10 NOTE — Patient Instructions (Signed)
I'm sorry you are not feeling well. Here is the link to the fact sheet on the antiviral medication (Molnupiravir) as well as some good over-the-counter medications/vitamins for COVID infection. Please rest and stay hydrated. Monitor your symptoms and call us or go to the ED for any alarm findings as described below. I hope you feel better soon!  [email or web address]   Over the counter medications that may be helpful for symptoms:  . Guaifenesin 1200 mg extended release tabs twice daily, with plenty of water o For cough and congestion o Brand name: Mucinex   . Pseudoephedrine 30 mg, one or two tabs every 4 to 6 hours o For sinus congestion o Brand name: Sudafed o You must get this from the pharmacy counter.  . Oxymetazoline nasal spray each morning, one spray in each nostril, for NO MORE THAN 3 days  o For nasal and sinus congestion o Brand name: Afrin . Saline nasal spray or Saline Nasal Irrigation 3-5 times a day o For nasal and sinus congestion o Brand names: Stevens or AYR . Fluticasone nasal spray, one spray in each nostril, each morning (after oxymetazoline and saline, if used) o For nasal and sinus congestion o Brand name: Flonase . Warm salt water gargles  o For sore throat o Every few hours as needed . Alternate ibuprofen 400-600 mg and acetaminophen 1000 mg every 4-6 hours o For fever, body aches, headache o Brand names: Motrin or Advil and Tylenol . Dextromethorphan 12-hour cough version 30 mg every 12 hours  o For cough o Brand name: Delsym Stop all other cold medications for now (Nyquil, Dayquil, Tylenol Cold, Theraflu, etc) and other non-prescription cough/cold preparations. Many of these have the same ingredients listed above and could cause an overdose of medication.   Herbal treatments that have been shown to be helpful in some patients include: Vitamin C 1000mg  per day Vitamin D 4000iU per day Zinc 100mg  per  day Quercetin 25-500mg  twice a day Melatonin 5-10mg  at bedtime  General Instructions . Allow your body to rest . Drink PLENTY of fluids . Isolate yourself from everyone, even family, until test results have returned  If your COVID-19 test is positive . Then you ARE INFECTED and you can pass the virus to others . You must quarantine from others for a minimum of  o 10 days since symptoms started AND o You are fever free for 24 hours WITHOUT any medication to reduce fever AND o Your symptoms are improving . Do not go to the store or other public areas . Do not go around household members who are not known to be infected with COVID-19 . If you MUST leave your area of quarantine (example: go to a bathroom you share with others in your home), you must o Wear a mask o Wash your hands thoroughly o Wipe down any surfaces you touch . Do not share food, drinks, towels, or other items with other persons . Dispose of your own tissues, food containers, etc  Once you have recovered, please continue good preventive care measures, including:  . wearing a mask when in public . wash your hands frequently . avoid touching your face/nose/eyes . cover coughs/sneezes with the inside of your elbow . stay out of crowds . keep a 6 foot distance from others  If you develop severe shortness of breath, uncontrolled fevers, coughing up blood, confusion, chest pain, or signs of dehydration (such as significantly decreased urine amounts or dizziness with standing) please go to the ER.

## 2021-05-10 NOTE — Telephone Encounter (Signed)
Looks like her headache was COVID, I went ahead and responded to her, her sports medicine procedures already scheduled and I do not think it needs to be postponed considering she is fully vaccinated.

## 2021-05-11 ENCOUNTER — Other Ambulatory Visit: Payer: Self-pay | Admitting: Osteopathic Medicine

## 2021-05-11 DIAGNOSIS — M47816 Spondylosis without myelopathy or radiculopathy, lumbar region: Secondary | ICD-10-CM

## 2021-05-12 ENCOUNTER — Other Ambulatory Visit: Payer: Self-pay | Admitting: Osteopathic Medicine

## 2021-05-12 ENCOUNTER — Ambulatory Visit: Payer: Medicare HMO

## 2021-05-12 DIAGNOSIS — E782 Mixed hyperlipidemia: Secondary | ICD-10-CM

## 2021-05-12 MED ORDER — GABAPENTIN 300 MG PO CAPS: 300.0000 mg | ORAL_CAPSULE | Freq: Every day | ORAL | 1 refills | Status: DC

## 2021-05-18 ENCOUNTER — Telehealth: Payer: Medicare HMO

## 2021-05-18 ENCOUNTER — Telehealth: Payer: Self-pay | Admitting: Osteopathic Medicine

## 2021-05-18 NOTE — Telephone Encounter (Signed)
Patient stated she had an appointment today with our pharmacist via telephone at 2pm. I tried to tell patient that it was canceled via mychart and patient got upset. Stated she didn't want to argue but that it is one thing after another and she has talked to 8 people today at [facility] and she is done. She said she is never coming back and she wanted me to let Dr.Alexander know and our Environmental education officer. Just FYI.

## 2021-05-22 ENCOUNTER — Other Ambulatory Visit: Payer: Self-pay | Admitting: Osteopathic Medicine

## 2021-05-22 DIAGNOSIS — M6740 Ganglion, unspecified site: Secondary | ICD-10-CM

## 2021-05-26 DIAGNOSIS — M5116 Intervertebral disc disorders with radiculopathy, lumbar region: Secondary | ICD-10-CM | POA: Diagnosis not present

## 2021-05-26 DIAGNOSIS — I10 Essential (primary) hypertension: Secondary | ICD-10-CM | POA: Diagnosis not present

## 2021-06-09 ENCOUNTER — Other Ambulatory Visit: Payer: Self-pay

## 2021-06-09 ENCOUNTER — Ambulatory Visit (INDEPENDENT_AMBULATORY_CARE_PROVIDER_SITE_OTHER): Payer: Medicare HMO | Admitting: Osteopathic Medicine

## 2021-06-09 ENCOUNTER — Encounter: Payer: Self-pay | Admitting: Osteopathic Medicine

## 2021-06-09 VITALS — BP 152/73 | HR 76 | Temp 97.8°F | Wt 133.1 lb

## 2021-06-09 DIAGNOSIS — Z87891 Personal history of nicotine dependence: Secondary | ICD-10-CM | POA: Diagnosis not present

## 2021-06-09 DIAGNOSIS — F17290 Nicotine dependence, other tobacco product, uncomplicated: Secondary | ICD-10-CM | POA: Diagnosis not present

## 2021-06-09 DIAGNOSIS — I10 Essential (primary) hypertension: Secondary | ICD-10-CM | POA: Diagnosis not present

## 2021-06-09 DIAGNOSIS — E782 Mixed hyperlipidemia: Secondary | ICD-10-CM | POA: Diagnosis not present

## 2021-06-09 DIAGNOSIS — Z0000 Encounter for general adult medical examination without abnormal findings: Secondary | ICD-10-CM | POA: Diagnosis not present

## 2021-06-09 DIAGNOSIS — M810 Age-related osteoporosis without current pathological fracture: Secondary | ICD-10-CM

## 2021-06-09 DIAGNOSIS — J449 Chronic obstructive pulmonary disease, unspecified: Secondary | ICD-10-CM

## 2021-06-09 MED ORDER — BUPROPION HCL ER (XL) 150 MG PO TB24: 150.0000 mg | ORAL_TABLET | ORAL | 3 refills | Status: DC

## 2021-06-09 NOTE — Patient Instructions (Addendum)
Starting Wellbutrin to help with nicotine addiction   General Preventive Care Most recent routine screening labs: ordered today.  Blood pressure goal 130/80 or less.  Tobacco: don't! Alcohol: responsible moderation is ok for most adults - if you have concerns about your alcohol intake, please talk to me!  Exercise: as tolerated to reduce risk of cardiovascular disease and diabetes. Strength training will also prevent osteoporosis.  Mental health: if need for mental health care (medicines, counseling, other), or concerns about moods, please let me know!  Sexual / Reproductive health: if need for STD testing, or if concerns with libido/pain problems, please let me know!  Advanced Directive: Living Will and/or Healthcare Power of Attorney recommended for all adults, regardless of age or health.  Vaccines Flu vaccine: recommended every fall/winter Shingles vaccine: we have shot #1 of 2 on file, please ask your pharmacist about getting second shot, or send [place] records if you've already had it done!  Pneumonia vaccines: all done! Tetanus booster: every 10 years, due 2015 COVID vaccine: THANKS for getting your vaccine! 2nd booster is strongly advised! Cancer screenings  Colon cancer screening: due 2028 Breast cancer screening: due 12/2020 call [phone] Lung cancer screening: CT chest every year for those aged 21 to 42 years who have a 20 pack-year smoking history and currently smoke or have quit within the past 15 years - will arrange screening, someone should call you!  Infection screenings  HIV: recommended screening at least once age 87-65, more often as needed. Gonorrhea/Chlamydia, other STI: screening as needed Hepatitis C: recommended once for everyone age 55-97 TB: certain at-risk populations, or depending on work requirements and/or travel history Other Bone Density Test: every 2 years 10/2022

## 2021-06-09 NOTE — Progress Notes (Signed)
HPI: Krista [name] is a 70 y.o. female who  has a past medical history of Arthritis, Bell palsy, Brain aneurysm, DDD (degenerative disc disease), cervical, DDD (degenerative disc disease), lumbar, Hyperlipidemia, Hypertension, and Osteoporosis.  she presents to [facility] today, 06/09/21,  for chief complaint of:  Physical   Cough, congestion. S/p COVID infection few weeks ago Nicorette gum -tired of this, would like something else.  Has not smoked for about 12.5 years but cannot seem to get off the nicotine gum higher dosage.  Has tried Chantix in the past and did not have a good experience with this.    ASSESSMENT/PLAN: The primary encounter diagnosis was Annual physical exam. Diagnoses of Essential hypertension, Mixed hyperlipidemia, Age-related osteoporosis without current pathological fracture, Chronic obstructive pulmonary disease, unspecified COPD type ([place]), Former smoker, and Other tobacco product nicotine dependence, uncomplicated were also pertinent to this visit.  Return to clinic for further evaluation if cough does not improve, suspect post-COVID cough, normal lung exam today.  Starting Wellbutrin to help with nicotine addiction.  Advised to reduce dosage of nicotine gum, patient states she would like to quit cold [place], I stated this is fine but let me know if any problems.  Would certainly be open to increasing Wellbutrin pending her response to this medication.  Positive depression screening, I think we might be able to help this with the Wellbutrin, would certainly have a low threshold for switching to/adding SSRI if needed  Orders Placed This Encounter  Procedures   CBC   COMPLETE METABOLIC PANEL WITH GFR   Lipid panel   VITAMIN D 25 Hydroxy (Vit-D Deficiency, Fractures)   Ambulatory Referral for Lung Cancer Scre     Meds ordered this encounter  Medications   buPROPion (WELLBUTRIN XL) 150 MG 24 hr tablet    Sig: Take 1 tablet  (150 mg total) by mouth every morning.    Dispense:  90 tablet    Refill:  3    Patient Instructions  Starting Wellbutrin to help with nicotine addiction   General Preventive Care Most recent routine screening labs: ordered today.  Blood pressure goal 130/80 or less.  Tobacco: don't! Alcohol: responsible moderation is ok for most adults - if you have concerns about your alcohol intake, please talk to me!  Exercise: as tolerated to reduce risk of cardiovascular disease and diabetes. Strength training will also prevent osteoporosis.  Mental health: if need for mental health care (medicines, counseling, other), or concerns about moods, please let me know!  Sexual / Reproductive health: if need for STD testing, or if concerns with libido/pain problems, please let me know!  Advanced Directive: Living Will and/or Healthcare Power of Attorney recommended for all adults, regardless of age or health.  Vaccines Flu vaccine: recommended every fall/winter Shingles vaccine: we have shot #1 of 2 on file, please ask your pharmacist about getting second shot, or send [place] records if you've already had it done!  Pneumonia vaccines: all done! Tetanus booster: every 10 years, due 2015 COVID vaccine: THANKS for getting your vaccine! 2nd booster is strongly advised! Cancer screenings  Colon cancer screening: due 2028 Breast cancer screening: due 12/2020 call [phone] Lung cancer screening: CT chest every year for those aged 84 to 85 years who have a 20 pack-year smoking history and currently smoke or have quit within the past 15 years - will arrange screening, someone should call you!  Infection screenings  HIV: recommended screening at least once age 75-65, more often  as needed. Gonorrhea/Chlamydia, other STI: screening as needed Hepatitis C: recommended once for everyone age 67-89 TB: certain at-risk populations, or depending on work requirements and/or travel history Other Bone Density Test: every 2  years 10/2022    Follow-up plan: Return in about 6 weeks (around 07/21/2021) for IN-OFFICE VISIT, RECHECK [place]. PROLIA INJECTION WHENEVER (PENDING LABS).                                                 ################################################# ################################################# ################################################# #################################################    Current Meds  Medication Sig   buPROPion (WELLBUTRIN XL) 150 MG 24 hr tablet Take 1 tablet (150 mg total) by mouth every morning.   Calcium-Magnesium-Vitamin D (CITRACAL CALCIUM+D) 600-40-500 MG-MG-UNIT TB24 Take by mouth.   cholecalciferol (VITAMIN D3) 25 MCG (1000 UNIT) tablet Take 1,000 Units by mouth daily.   cyclobenzaprine (FLEXERIL) 10 MG tablet TAKE 1/2 TO 1 TABLET BY MOUTH 3 TIMES A DAY AS NEEDED MUSCLE SPASMS   diphenhydramine-acetaminophen (TYLENOL PM) 25-500 MG TABS tablet Take 2 tablets by mouth at bedtime as needed.    gabapentin (NEURONTIN) 300 MG capsule Take 1 capsule (300 mg total) by mouth at bedtime.   hydrochlorothiazide (HYDRODIURIL) 12.5 MG tablet TAKE 1 TABLET (12.5 MG TOTAL) BY MOUTH DAILY.   lidocaine (XYLOCAINE) 2 % solution Use as directed 10-15 mLs in the mouth or throat every 3 (three) hours as needed (mouth/throat pain).   loratadine (CLARITIN) 10 MG tablet Take 10 mg by mouth daily.   LUTEIN ESTERS PO Take by mouth.   meloxicam (MOBIC) 15 MG tablet TAKE 1 TABLET BY MOUTH EVERY MORNING WITH FOOD   traMADol (ULTRAM) 50 MG tablet Take 0.5-1 tablets (25-50 mg total) by mouth every 8 (eight) hours as needed for moderate pain. Maximum 6 tabs per day.   valACYclovir (VALTREX) 1000 MG tablet Take 2 tablets (2,000 mg total) by mouth 2 (two) times daily for 1 day, THEN 1 tablet (1,000 mg total) daily.   [DISCONTINUED] famotidine (PEPCID) 40 MG tablet TAKE 1 TABLET DAILY. PATIENT NEEDS OFFICE VISIT  FOR ADDITIONAL REFILLS.    Allergies  Allergen Reactions   Codeine Other (See Comments)    Hyperactivity "wired" super hyperactivity      Kiwi Extract Hives   Other Hives, Rash and Other (See Comments)    Nectarines--tongue swells    Plum Pulp Hives   Tape Hives and Rash   Tomato Hives   Prednisone Other (See Comments)    Irritability         Review of Systems: Pertinent (+) and (-) ROS in HPI as above   Exam:  BP (!) 152/73   Pulse 76   Temp 97.8 F (36.6 C)   Wt 133 lb 0.8 oz (60.4 kg)   SpO2 98%   BMI 22.14 kg/m  Constitutional: VS see above. General Appearance: alert, well-developed, well-nourished, NAD Neck: No masses, trachea midline.  Respiratory: Normal respiratory effort. no wheeze, no rhonchi, no rales Cardiovascular: S1/S2 normal, no murmur, no rub/gallop auscultated. RRR.  Musculoskeletal: Gait normal. Symmetric and independent movement of all extremities Abdominal: non-tender, non-distended, no appreciable organomegaly, neg Murphy's, BS [id number] Neurological: Normal balance/coordination. No tremor. Skin: warm, dry, intact.  Psychiatric: Normal judgment/insight. Normal mood and affect. Oriented x3.       Visit summary with medication list and pertinent instructions was  printed for patient to review, patient was advised to alert [place] if any updates are needed. All questions at time of visit were answered - patient instructed to contact office with any additional concerns. ER/RTC precautions were reviewed with the patient and understanding verbalized.    Please note: voice recognition software was used to produce this document, and typos may escape review. Please contact Dr. Sheppard Coil for any needed clarifications.    Follow up plan: Return in about 6 weeks (around 07/21/2021) for IN-OFFICE VISIT, RECHECK ON NEW MEDICATION - WELLBUTRIN. PROLIA INJECTION WHENEVER (PENDING LABS).

## 2021-06-10 LAB — LIPID PANEL
Cholesterol: 198 mg/dL (ref <200)
HDL: 81 mg/dL (ref >=50)
LDL Cholesterol (Calc): 96 mg/dL (calc)
Non-HDL Cholesterol (Calc): 117 mg/dL (calc) (ref <130)
Total CHOL/HDL Ratio: 2.4 (calc) (ref <5.0)
Triglycerides: 113 mg/dL (ref <150)

## 2021-06-10 LAB — CBC
HCT: 34.5 % — ABNORMAL LOW (ref 35.0–45.0)
Hemoglobin: 11.6 g/dL — ABNORMAL LOW (ref 11.7–15.5)
MCH: 32.3 pg (ref 27.0–33.0)
MCHC: 33.6 g/dL (ref 32.0–36.0)
MCV: 96.1 fL (ref 80.0–100.0)
MPV: 9.1 fL (ref 7.5–12.5)
Platelets: 309 10*3/uL (ref 140–400)
RBC: 3.59 10*6/uL — ABNORMAL LOW (ref 3.80–5.10)
RDW: 14.5 % (ref 11.0–15.0)
WBC: 9 10*3/uL (ref 3.8–10.8)

## 2021-06-10 LAB — COMPLETE METABOLIC PANEL WITH GFR
AG Ratio: 1.6 (calc) (ref 1.0–2.5)
ALT: 13 U/L (ref 6–29)
AST: 19 U/L (ref 10–35)
Albumin: 4.5 g/dL (ref 3.6–5.1)
Alkaline phosphatase (APISO): 73 U/L (ref 37–153)
BUN: 11 mg/dL (ref 7–25)
CO2: 32 mmol/L (ref 20–32)
Calcium: 10.7 mg/dL — ABNORMAL HIGH (ref 8.6–10.4)
Chloride: 99 mmol/L (ref 98–110)
Creat: 0.81 mg/dL (ref 0.50–0.99)
GFR, Est African American: 86 mL/min/{1.73_m2} (ref >=60)
GFR, Est Non African American: 74 mL/min/{1.73_m2} (ref >=60)
Globulin: 2.9 g/dL (calc) (ref 1.9–3.7)
Glucose, Bld: 88 mg/dL (ref 65–99)
Potassium: 3.7 mmol/L (ref 3.5–5.3)
Sodium: 139 mmol/L (ref 135–146)
Total Bilirubin: 0.5 mg/dL (ref 0.2–1.2)
Total Protein: 7.4 g/dL (ref 6.1–8.1)

## 2021-06-10 LAB — VITAMIN D 25 HYDROXY (VIT D DEFICIENCY, FRACTURES): Vit D, 25-Hydroxy: 106 ng/mL — ABNORMAL HIGH (ref 30–100)

## 2021-06-14 DIAGNOSIS — M48062 Spinal stenosis, lumbar region with neurogenic claudication: Secondary | ICD-10-CM | POA: Diagnosis not present

## 2021-06-14 DIAGNOSIS — M47816 Spondylosis without myelopathy or radiculopathy, lumbar region: Secondary | ICD-10-CM | POA: Diagnosis not present

## 2021-06-17 ENCOUNTER — Other Ambulatory Visit: Payer: Self-pay | Admitting: *Deleted

## 2021-06-17 DIAGNOSIS — Z87891 Personal history of nicotine dependence: Secondary | ICD-10-CM

## 2021-06-21 ENCOUNTER — Other Ambulatory Visit: Payer: Self-pay | Admitting: Osteopathic Medicine

## 2021-06-21 DIAGNOSIS — M6740 Ganglion, unspecified site: Secondary | ICD-10-CM

## 2021-06-23 DIAGNOSIS — M47816 Spondylosis without myelopathy or radiculopathy, lumbar region: Secondary | ICD-10-CM | POA: Diagnosis not present

## 2021-06-23 DIAGNOSIS — I10 Essential (primary) hypertension: Secondary | ICD-10-CM | POA: Diagnosis not present

## 2021-07-13 DIAGNOSIS — M47816 Spondylosis without myelopathy or radiculopathy, lumbar region: Secondary | ICD-10-CM | POA: Diagnosis not present

## 2021-07-19 ENCOUNTER — Other Ambulatory Visit: Payer: Self-pay | Admitting: Osteopathic Medicine

## 2021-07-19 DIAGNOSIS — M6740 Ganglion, unspecified site: Secondary | ICD-10-CM

## 2021-07-27 ENCOUNTER — Encounter: Payer: Self-pay | Admitting: Osteopathic Medicine

## 2021-07-27 ENCOUNTER — Other Ambulatory Visit: Payer: Self-pay

## 2021-07-27 ENCOUNTER — Ambulatory Visit (INDEPENDENT_AMBULATORY_CARE_PROVIDER_SITE_OTHER): Payer: Medicare HMO | Admitting: Osteopathic Medicine

## 2021-07-27 VITALS — BP 118/82 | HR 93 | Temp 98.0°F | Wt 132.1 lb

## 2021-07-27 DIAGNOSIS — M810 Age-related osteoporosis without current pathological fracture: Secondary | ICD-10-CM | POA: Diagnosis not present

## 2021-07-27 DIAGNOSIS — M47816 Spondylosis without myelopathy or radiculopathy, lumbar region: Secondary | ICD-10-CM

## 2021-07-27 MED ORDER — VALSARTAN-HYDROCHLOROTHIAZIDE 80-12.5 MG PO TABS: 1.0000 | ORAL_TABLET | Freq: Every day | ORAL | 0 refills | Status: DC

## 2021-07-27 MED ORDER — TRAMADOL HCL 50 MG PO TABS: 25.0000 mg | ORAL_TABLET | Freq: Three times a day (TID) | ORAL | 0 refills | Status: DC | PRN

## 2021-07-27 MED ORDER — DENOSUMAB 60 MG/ML ~~LOC~~ SOSY
60.0000 mg | PREFILLED_SYRINGE | Freq: Once | SUBCUTANEOUS | Status: AC
  Administered 2021-07-27: 60 mg via SUBCUTANEOUS

## 2021-07-27 NOTE — Patient Instructions (Addendum)
INCREASE WELLBUTRIN from 150 mg to 300 mg - can take 2 tablets together. Hopefully will help with nicotine cravings.   CHANGING BP MEDICATION from HCTZ 12.5 mg to combination pill VALSARTAN-HCTZ 80-12.5 mg. STOP the HCTZ 12.5 mg dose. Plan to recheck BP at visit w/ Dr T  SEE DR T IN 2 WEEKS to follow up on finger pain and hip pain.

## 2021-07-27 NOTE — Progress Notes (Signed)
Krista [name] is a 70 y.o. female who presents to  [facility]  today, 07/27/21, seeking care for the following:  Prolia injection  Recheck mental health and nicotine patch cessation on Wellbutrin which was started about 6 weeks ago. (+)depression screening at past visit, see below. Quit smoking about 12+ years ago but cannot seem to quit the nicotine patches.  Concern for musculoskeletal pain, particularly in finger where she has had injections done before, concern for bilateral hip and bilateral foot pain.  No new rashes, hair or other skin changes, mucous membrane ulceration, night sweats, severe fatigue, or other concerns.   Depression screen [facility] 2/9 07/27/2021 06/09/2021 09/11/2020  Decreased Interest 0 3 0  Down, Depressed, Hopeless 1 0 0  PHQ - 2 Score 1 3 0  Altered sleeping 2 - -  Tired, decreased energy 0 - -  Change in appetite 2 - -  Feeling bad or failure about yourself  2 - -  Trouble concentrating 2 - -  Moving slowly or fidgety/restless 1 - -  Suicidal thoughts 0 - -  PHQ-9 Score 10 - -  Difficult doing work/chores Somewhat difficult - -     ASSESSMENT & PLAN with other pertinent findings:  The primary encounter diagnosis was Age-related osteoporosis without current pathological fracture. A diagnosis of Facet arthritis of lumbar region was also pertinent to this visit.    Patient Instructions  INCREASE WELLBUTRIN from 150 mg to 300 mg - can take 2 tablets together. Hopefully will help with nicotine cravings.   CHANGING BP MEDICATION from HCTZ 12.5 mg to combination pill VALSARTAN-HCTZ 80-12.5 mg. STOP the HCTZ 12.5 mg dose. Plan to recheck BP at visit w/ Dr T  SEE DR T IN 2 WEEKS to follow up on finger pain and hip pain.   Orders Placed This Encounter  Procedures   BASIC METABOLIC PANEL WITH GFR     Meds ordered this encounter  Medications   denosumab (PROLIA) injection 60 mg    Order Specific  Question:   Patient is enrolled in [facility] program for this medication and I have provided a copy of the Prolia Medication Guide and Patient Brochure.    Answer:   Yes    Order Specific Question:   I have reviewed with the patient the information in the Prolia Medication Guide and Patient Counseling Chart including the serious risks of Prolia and symptoms of each risk.    Answer:   Yes    Order Specific Question:   I have advised the patient to seek medical attention if they have signs or symptoms of any of the serious risks.    Answer:   Yes   valsartan-hydrochlorothiazide (DIOVAN-HCT) 80-12.5 MG tablet    Sig: Take 1 tablet by mouth daily.    Dispense:  90 tablet    Refill:  0    CANCEL/HOLD HCTZ 12.5   traMADol (ULTRAM) 50 MG tablet    Sig: Take 0.5-1 tablets (25-50 mg total) by mouth every 8 (eight) hours as needed for moderate pain. Maximum 6 tabs per day.    Dispense:  21 tablet    Refill:  0      See below for relevant physical exam findings  See below for recent lab and imaging results reviewed  Medications, allergies, PMH, PSH, SocH, FamH reviewed below    Follow-up instructions: Return in about 2 weeks (around 08/10/2021) for SPORTS MED FOR FINGER PAIN, RECHECK BP ON  NEW RX, BMP WHILE IN OFFICE (MONITOR CALCIUM) .                                        Exam:  BP 118/82 (BP Location: Left Arm, Patient Position: Sitting, Cuff Size: Normal)   Pulse 93   Temp 98 F (36.7 C) (Oral)   Wt 132 lb 1.3 oz (59.9 kg)   BMI 21.98 kg/m  Constitutional: VS see above. General Appearance: alert, well-developed, well-nourished, NAD Neck: No masses, trachea midline.  Respiratory: Normal respiratory effort. no wheeze, no rhonchi, no rales Cardiovascular: S1/S2 normal, no murmur, no rub/gallop auscultated. RRR.  Musculoskeletal: Gait normal. Symmetric and independent movement of all extremities Neurological: Normal balance/coordination. No tremor. Skin:  warm, dry, intact.  Psychiatric: Normal judgment/insight. Normal mood and affect. Oriented x3.   Current Meds  Medication Sig   buPROPion (WELLBUTRIN XL) 150 MG 24 hr tablet Take 1 tablet (150 mg total) by mouth every morning.   Calcium-Magnesium-Vitamin D (CITRACAL CALCIUM+D) 600-40-500 MG-MG-UNIT TB24 Take by mouth.   cholecalciferol (VITAMIN D3) 25 MCG (1000 UNIT) tablet Take 1,000 Units by mouth daily.   cyclobenzaprine (FLEXERIL) 10 MG tablet TAKE 1/2 TO 1 TABLET BY MOUTH 3 TIMES A DAY AS NEEDED MUSCLE SPASMS   diphenhydramine-acetaminophen (TYLENOL PM) 25-500 MG TABS tablet Take 2 tablets by mouth at bedtime as needed.    gabapentin (NEURONTIN) 300 MG capsule Take 1 capsule (300 mg total) by mouth at bedtime.   lidocaine (XYLOCAINE) 2 % solution Use as directed 10-15 mLs in the mouth or throat every 3 (three) hours as needed (mouth/throat pain).   LUTEIN ESTERS PO Take by mouth.   meloxicam (MOBIC) 15 MG tablet TAKE 1 TABLET BY MOUTH EVERY DAY IN THE MORNING WITH FOOD   valsartan-hydrochlorothiazide (DIOVAN-HCT) 80-12.5 MG tablet Take 1 tablet by mouth daily.   [DISCONTINUED] hydrochlorothiazide (HYDRODIURIL) 12.5 MG tablet TAKE 1 TABLET (12.5 MG TOTAL) BY MOUTH DAILY.   [DISCONTINUED] traMADol (ULTRAM) 50 MG tablet Take 0.5-1 tablets (25-50 mg total) by mouth every 8 (eight) hours as needed for moderate pain. Maximum 6 tabs per day.    Allergies  Allergen Reactions   Codeine Other (See Comments)    Hyperactivity "wired" super hyperactivity      Kiwi Extract Hives   Other Hives, Rash and Other (See Comments)    Nectarines--tongue swells    Plum Pulp Hives   Tape Hives and Rash   Tomato Hives   Prednisone Other (See Comments)    Irritability      Patient Active Problem List   Diagnosis Date Noted   Osteoporosis 11/05/2020   DDD (degenerative disc disease), cervical 07/06/2020   Facet arthritis of lumbar region 05/06/2020   Bilateral wrist pain 05/06/2020    Inflammatory arthritis 03/13/2020   Osteoarthritis of left little finger 07/31/2018   Acute bronchitis with COPD ([place]) 05/10/2018   Impacted cerumen of left ear 03/07/2018   History of left mastoidectomy 03/07/2018   Tympanosclerosis, left ear 03/07/2018   Cardiac risk counseling 09/05/2017   Essential hypertension 09/05/2017   Mixed hyperlipidemia 09/05/2017   History of Bell's palsy 08/15/2017   History of osteoporosis 08/15/2017   History of colon polyps 08/15/2017   Diarrhea 08/15/2017   History of nonmelanoma skin cancer 08/15/2017   Brain aneurysm 08/15/2017   Bilateral hearing loss 08/15/2017   H/O hysterectomy for benign disease 08/15/2017  Family History  Problem Relation Age of Onset   Heart failure Mother    Cancer Father    Cancer Brother     Social History   Tobacco Use  Smoking Status Former   Types: Cigarettes   Quit date: 12/05/2008   Years since quitting: 12.6  Smokeless Tobacco Never    Past Surgical History:  Procedure Laterality Date   ABDOMINAL HYSTERECTOMY     CEREBRAL ANEURYSM REPAIR     EYE SURGERY     cararact   FOOT SURGERY     HAND SURGERY     MASTOIDECTOMY Left     Immunization History  Administered Date(s) Administered   Fluad Quad(high Dose 65+) 08/23/2019   Influenza, High Dose Seasonal PF 08/15/2017, 08/24/2018, 09/04/2018   Influenza-Unspecified 08/23/2019, 08/01/2020   PFIZER(Purple Top)SARS-COV-2 Vaccination 02/11/2020, 08/01/2020, 12/01/2020   Pneumococcal Conjugate-13 09/11/2020   Pneumococcal Polysaccharide-23 09/03/2019   Tdap 06/20/2014   Zoster Recombinat (Shingrix) 12/01/2020    Recent Results (from the past 2160 hour(s))  CBC     Status: Abnormal   Collection Time: 06/09/21 12:00 AM  Result Value Ref Range   WBC 9.0 3.8 - 10.8 Thousand/uL   RBC 3.59 (L) 3.80 - 5.10 Million/uL   Hemoglobin 11.6 (L) 11.7 - 15.5 g/dL   HCT 34.5 (L) 35.0 - 45.0 %   MCV 96.1 80.0 - 100.0 fL   MCH 32.3 27.0 - 33.0 pg   MCHC 33.6  32.0 - 36.0 g/dL   RDW 14.5 11.0 - 15.0 %   Platelets 309 140 - 400 Thousand/uL   MPV 9.1 7.5 - 12.5 fL  COMPLETE METABOLIC PANEL WITH GFR     Status: Abnormal   Collection Time: 06/09/21 12:00 AM  Result Value Ref Range   Glucose, Bld 88 65 - 99 mg/dL    Comment: .            Fasting reference interval .    BUN 11 7 - 25 mg/dL   Creat 0.81 0.50 - 0.99 mg/dL    Comment: For patients >78 years of age, the reference limit for Creatinine is approximately 13% higher for people identified as African-American. .    GFR, Est Non African American 74 > OR = 60 mL/min/1.73m   GFR, Est African American 86 > OR = 60 mL/min/1.737m  BUN/Creatinine Ratio NOT APPLICABLE 6 - 22 (calc)   Sodium 139 135 - 146 mmol/L   Potassium 3.7 3.5 - 5.3 mmol/L   Chloride 99 98 - 110 mmol/L   CO2 32 20 - 32 mmol/L   Calcium 10.7 (H) 8.6 - 10.4 mg/dL   Total Protein 7.4 6.1 - 8.1 g/dL   Albumin 4.5 3.6 - 5.1 g/dL   Globulin 2.9 1.9 - 3.7 g/dL (calc)   AG Ratio 1.6 1.0 - 2.5 (calc)   Total Bilirubin 0.5 0.2 - 1.2 mg/dL   Alkaline phosphatase (APISO) 73 37 - 153 U/L   AST 19 10 - 35 U/L   ALT 13 6 - 29 U/L  Lipid panel     Status: None   Collection Time: 06/09/21 12:00 AM  Result Value Ref Range   Cholesterol 198 <200 mg/dL   HDL 81 > OR = 50 mg/dL   Triglycerides 113 <150 mg/dL   LDL Cholesterol (Calc) 96 mg/dL (calc)    Comment: Reference range: <100 . Desirable range <100 mg/dL for primary prevention;   <70 mg/dL for patients with CHD or diabetic patients  with > or =  2 CHD risk factors. Marland Kitchen LDL-C is now calculated using the Martin-Hopkins  calculation, which is a validated novel method providing  better accuracy than the Friedewald equation in the  estimation of LDL-C.  Cresenciano Genre et al. Annamaria Helling. [id number]): 2061-2068  (http://education.[facility].com/faq/FAQ164)    Total CHOL/HDL Ratio 2.4 <5.0 (calc)   Non-HDL Cholesterol (Calc) 117 <130 mg/dL (calc)    Comment: For patients with  diabetes plus 1 major ASCVD risk  factor, treating to a non-HDL-C goal of <100 mg/dL  (LDL-C of <70 mg/dL) is considered a therapeutic  option.   VITAMIN D 25 Hydroxy (Vit-D Deficiency, Fractures)     Status: Abnormal   Collection Time: 06/09/21 12:00 AM  Result Value Ref Range   Vit D, 25-Hydroxy 106 (H) 30 - 100 ng/mL    Comment: Vitamin D Status         25-OH Vitamin D: . Deficiency:                    <20 ng/mL Insufficiency:             20 - 29 ng/mL Optimal:                 > or = 30 ng/mL . For 25-OH Vitamin D testing on patients on  D2-supplementation and patients for whom quantitation  of D2 and D3 fractions is required, the QuestAssureD(TM) 25-OH VIT D, (D2,D3), LC/MS/MS is recommended: order  code [phone] (patients >30yr). See Note 1 . Note 1 . For additional information, please refer to  http://education.[facility].com/faq/FAQ199  (This link is being provided for informational/ educational purposes only.)     No results found.     All questions at time of visit were answered - patient instructed to contact office with any additional concerns or updates. ER/RTC precautions were reviewed with the patient as applicable.   Please note: manual typing as well as voice recognition software may have been used to produce this document - typos may escape review. Please contact Dr. ASheppard Coilfor any needed clarifications.

## 2021-08-02 ENCOUNTER — Encounter: Payer: Self-pay | Admitting: Acute Care

## 2021-08-02 ENCOUNTER — Ambulatory Visit (INDEPENDENT_AMBULATORY_CARE_PROVIDER_SITE_OTHER): Payer: Medicare HMO

## 2021-08-02 ENCOUNTER — Telehealth (INDEPENDENT_AMBULATORY_CARE_PROVIDER_SITE_OTHER): Payer: Medicare HMO | Admitting: Acute Care

## 2021-08-02 ENCOUNTER — Other Ambulatory Visit: Payer: Self-pay

## 2021-08-02 DIAGNOSIS — Z87891 Personal history of nicotine dependence: Secondary | ICD-10-CM

## 2021-08-02 NOTE — Patient Instructions (Signed)
Thank you for participating in the [place] [facility]. It was our pleasure to meet you today. We will call you with the results of your scan within the next few days. Your scan will be assigned a Lung RADS category score by the physicians reading the scans.  This Lung RADS score determines follow up scanning.  See below for description of categories, and follow up screening recommendations. We will be in touch to schedule your follow up screening annually or based on recommendations of our providers. We will fax a copy of your scan results to your Primary Care Physician, or the physician who referred you to the program, to ensure they have the results. Please call the office if you have any questions or concerns regarding your scanning experience or results.  Our office number is [phone]. Please speak with Denise Phelps, RN. She is our Lung Cancer Screening RN. If she is unavailable when you call, please have the office staff send her a message. She will return your call at her earliest convenience. Remember, if your scan is normal, we will scan you annually as long as you continue to meet the criteria for the program. (Age 55-77, Current smoker or smoker who has quit within the last 15 years). If you are a smoker, remember, quitting is the single most powerful action that you can take to decrease your risk of lung cancer and other pulmonary, breathing related problems. We know quitting is hard, and we are here to help.  Please let us know if there is anything we can do to help you meet your goal of quitting. If you are a former smoker, congratulations. We are proud of you! Remain smoke free! Remember you can refer friends or family members through the number above.  We will screen them to make sure they meet criteria for the program. Thank you for helping us take better care of you by participating in Lung Screening.  Lung RADS Categories:  Lung RADS 1: no nodules  or definitely non-concerning nodules.  Recommendation is for a repeat annual scan in 12 months.  Lung RADS 2:  nodules that are non-concerning in appearance and behavior with a very low likelihood of becoming an active cancer. Recommendation is for a repeat annual scan in 12 months.  Lung RADS 3: nodules that are probably non-concerning , includes nodules with a low likelihood of becoming an active cancer.  Recommendation is for a 6-month repeat screening scan. Often noted after an upper respiratory illness. We will be in touch to make sure you have no questions, and to schedule your 6-month scan.  Lung RADS 4 A: nodules with concerning findings, recommendation is most often for a follow up scan in 3 months or additional testing based on our provider's assessment of the scan. We will be in touch to make sure you have no questions and to schedule the recommended 3 month follow up scan.  Lung RADS 4 B:  indicates findings that are concerning. We will be in touch with you to schedule additional diagnostic testing based on our provider's  assessment of the scan.   

## 2021-08-02 NOTE — Progress Notes (Signed)
Virtual Visit via Video Note  I connected with [name] on 08/02/21 at  2:30 PM EDT by a video enabled telemedicine application and verified that I am speaking with the correct person using two identifiers.  Location: Patient: At home Provider: [address], [place], [place], [address]    I discussed the limitations of evaluation and management by telemedicine and the availability of in person appointments. The patient expressed understanding and agreed to proceed.   Shared Decision Making Visit Lung Cancer Screening Program [phone])   Eligibility: Age 70 y.o. Pack Years Smoking History Calculation 32 pack year smoking history (# packs/per year x # years smoked) Recent History of coughing up blood  no Unexplained weight loss? no ( >Than 15 pounds within the last 6 months ) Prior History Lung / other cancer no (Diagnosis within the last 5 years already requiring surveillance chest CT Scans). Smoking Status Former Smoker Former Smokers: Years since quit: 12 years  Quit Date: 2010  Visit Components: Discussion included one or more decision making aids. yes Discussion included risk/benefits of screening. yes Discussion included potential follow up diagnostic testing for abnormal scans. yes Discussion included meaning and risk of over diagnosis. yes Discussion included meaning and risk of False Positives. yes Discussion included meaning of total radiation exposure. yes  Counseling Included: Importance of adherence to annual lung cancer LDCT screening. yes Impact of comorbidities on ability to participate in the program. yes Ability and willingness to under diagnostic treatment. yes  Smoking Cessation Counseling: Current Smokers:  Discussed importance of smoking cessation. yes Information about tobacco cessation classes and interventions provided to patient. yes Patient provided with "ticket" for LDCT Scan. yes Symptomatic Patient. no  Counseling NA Diagnosis  Code: Tobacco Use Z72.0 Asymptomatic Patient yes  Counseling (Intermediate counseling: > three minutes counseling) [id number] Former Smokers:  Discussed the importance of maintaining cigarette abstinence. yes Diagnosis Code: Personal History of Nicotine Dependence. [id number] Information about tobacco cessation classes and interventions provided to patient. Yes Patient provided with "ticket" for LDCT Scan. yes Written Order for Lung Cancer Screening with LDCT placed in Epic. Yes (CT Chest Lung Cancer Screening Low Dose W/O CM) [id number] Z12.2-Screening of respiratory organs Z87.891-Personal history of nicotine dependence  I spent 25 minutes of face to face time with Ms. Mandato discussing the risks and benefits of lung cancer screening. We viewed a power point together that explained in detail the above noted topics. We took the time to pause the power point at intervals to allow for questions to be asked and answered to ensure understanding. We discussed that she had taken the single most powerful action possible to decrease her risk of developing lung cancer when she quit smoking. I counseled her to remain smoke free, and to contact me if she ever had the desire to smoke again so that I can provide resources and tools to help support the effort to remain smoke free. We discussed the time and location of the scan, and that either  Doroteo Glassman RN or I will call with the results within  24-48 hours of receiving them. She has my card and contact information in the event she needs to speak with me, in addition to a copy of the power point we reviewed as a resource. She verbalized understanding of all of the above and had no further questions upon leaving the office.     I explained to the patient that there has been a high incidence of coronary artery disease noted on  these exams. I explained that this is a non-gated exam therefore degree or severity cannot be determined. This patient is not on statin therapy.  I have asked the patient to follow-up with their PCP regarding any incidental finding of coronary artery disease and management with diet or medication as they feel is clinically indicated. The patient verbalized understanding of the above and had no further questions.     Magdalen Spatz, NP 08/02/2021

## 2021-08-04 DIAGNOSIS — L814 Other melanin hyperpigmentation: Secondary | ICD-10-CM | POA: Diagnosis not present

## 2021-08-04 DIAGNOSIS — X32XXXS Exposure to sunlight, sequela: Secondary | ICD-10-CM | POA: Diagnosis not present

## 2021-08-04 DIAGNOSIS — Z85828 Personal history of other malignant neoplasm of skin: Secondary | ICD-10-CM | POA: Diagnosis not present

## 2021-08-04 DIAGNOSIS — L821 Other seborrheic keratosis: Secondary | ICD-10-CM | POA: Diagnosis not present

## 2021-08-10 ENCOUNTER — Other Ambulatory Visit: Payer: Self-pay

## 2021-08-10 ENCOUNTER — Ambulatory Visit (INDEPENDENT_AMBULATORY_CARE_PROVIDER_SITE_OTHER): Payer: Medicare HMO

## 2021-08-10 ENCOUNTER — Ambulatory Visit (INDEPENDENT_AMBULATORY_CARE_PROVIDER_SITE_OTHER): Payer: Medicare HMO | Admitting: Sports Medicine

## 2021-08-10 DIAGNOSIS — M19042 Primary osteoarthritis, left hand: Secondary | ICD-10-CM | POA: Diagnosis not present

## 2021-08-10 DIAGNOSIS — M810 Age-related osteoporosis without current pathological fracture: Secondary | ICD-10-CM | POA: Diagnosis not present

## 2021-08-10 NOTE — Assessment & Plan Note (Addendum)
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 X-rays did show potential erosive changes but rheumatoid work-up was negative. I would like her to have a consultation with hand surgery for discussion of fusion of the DIP. If we do have to do future injections I be happy to triazolam preprocedural sedation.

## 2021-08-10 NOTE — Progress Notes (Signed)
    Procedures performed today:    Procedure: Real-time Ultrasound Guided injection of the left fifth DIP Device: Samsung [id number]  Verbal informed consent obtained.  Time-out conducted.  Noted no overlying erythema, induration, or other signs of local infection.  Skin prepped in a sterile fashion.  Local anesthesia: Topical Ethyl chloride.  With sterile technique and under real time ultrasound guidance: Noted arthritic joint, 1/2 cc lidocaine, 1/2 cc kenalog 40 injected easily. Completed without difficulty  Advised to call if fevers/chills, erythema, induration, drainage, or persistent bleeding.  Images permanently stored and available for review in PACS.  Impression: Technically successful ultrasound guided injection.  Independent interpretation of notes and tests performed by another provider:   None.  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 X-rays did show potential erosive changes but rheumatoid work-up was negative. I would like her to have a consultation with hand surgery for discussion of fusion of the DIP. If we do have to do future injections I be happy to triazolam preprocedural sedation.    ___________________________________________ Gwen Her. Dianah Field, M.D., ABFM., CAQSM. Primary Care and [place] Instructor of [place] of [facility]

## 2021-08-11 LAB — BASIC METABOLIC PANEL WITH GFR
BUN: 11 mg/dL (ref 7–25)
CO2: 29 mmol/L (ref 20–32)
Calcium: 9.9 mg/dL (ref 8.6–10.4)
Chloride: 100 mmol/L (ref 98–110)
Creat: 0.95 mg/dL (ref 0.60–1.00)
Glucose, Bld: 94 mg/dL (ref 65–99)
Potassium: 4 mmol/L (ref 3.5–5.3)
Sodium: 137 mmol/L (ref 135–146)
eGFR: 64 mL/min/{1.73_m2} (ref >=60)

## 2021-08-12 NOTE — Progress Notes (Signed)
Please call patient and let them  know their  low dose Ct was read as a Lung RADS 2: nodules that are benign in appearance and behavior with a very low likelihood of becoming a clinically active cancer due to size or lack of growth. Recommendation per radiology is for a repeat LDCT in 12 months. .Please let them  know we will order and schedule their  annual screening scan for 07/2022. Please let them  know there was notation of CAD on their  scan.  Please remind the patient  that this is a non-gated exam therefore degree or severity of disease  cannot be determined. Please have them  follow up with their PCP regarding potential risk factor modification, dietary therapy or pharmacologic therapy if clinically indicated. Pt.  is not  currently on statin therapy. Please place order for annual  screening scan for  07/2022 and fax results to PCP. Thanks so much.  + Aortic atherosclerosis. And Coronary artery calcifications., No statin noted in Epic.Please have her follow up with her PCP. Thanks so much

## 2021-08-13 ENCOUNTER — Encounter: Payer: Self-pay | Admitting: *Deleted

## 2021-08-13 DIAGNOSIS — Z87891 Personal history of nicotine dependence: Secondary | ICD-10-CM

## 2021-08-18 DIAGNOSIS — M19042 Primary osteoarthritis, left hand: Secondary | ICD-10-CM | POA: Diagnosis not present

## 2021-08-18 DIAGNOSIS — M79642 Pain in left hand: Secondary | ICD-10-CM | POA: Diagnosis not present

## 2021-08-19 ENCOUNTER — Other Ambulatory Visit: Payer: Self-pay | Admitting: Osteopathic Medicine

## 2021-08-19 ENCOUNTER — Other Ambulatory Visit: Payer: Self-pay | Admitting: Sports Medicine

## 2021-08-19 DIAGNOSIS — M47816 Spondylosis without myelopathy or radiculopathy, lumbar region: Secondary | ICD-10-CM

## 2021-08-19 NOTE — Telephone Encounter (Signed)
Left msg for a return call from patient to call back and verify how much she is taking per day.

## 2021-08-20 ENCOUNTER — Telehealth: Payer: Self-pay | Admitting: Osteopathic Medicine

## 2021-08-20 NOTE — Telephone Encounter (Addendum)
-----   Message from Emeterio Reeve, DO sent at 07/27/2021 10:46 AM EDT ----- Please call patient How is wellbutrin working?  We Increased dose 07/27/21  If refill needed ok to send

## 2021-08-23 MED ORDER — BUPROPION HCL ER (XL) 300 MG PO TB24: 300.0000 mg | ORAL_TABLET | ORAL | 3 refills | Status: DC

## 2021-08-23 NOTE — Telephone Encounter (Signed)
Left a detailed vm msg for patient regarding provider's note. Patient informed to return a call back to the clinic with an update. Direct call back info provided.

## 2021-08-23 NOTE — H&P (Signed)
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 The risks, benefits and alternatives of surgical intervention were discussed and informed consent was obtained prior to surgery.  Past Medical History:  Past Medical History:  Diagnosis Date   Arthritis    Bell palsy    Brain aneurysm    DDD (degenerative disc disease), cervical    DDD (degenerative disc disease), lumbar    Hyperlipidemia    Hypertension    Osteoporosis     Past Surgical History:  Past Surgical History:  Procedure Laterality Date   ABDOMINAL HYSTERECTOMY     CEREBRAL ANEURYSM REPAIR     EYE SURGERY     cararact   FOOT SURGERY     HAND SURGERY     MASTOIDECTOMY Left     Medications:  Prior to Admission medications   Medication Sig Start Date End Date Taking? Authorizing Provider  buPROPion (WELLBUTRIN XL) 300 MG 24 hr tablet Take 1 tablet (300 mg total) by mouth every morning. 08/23/21   Emeterio Reeve, DO  Calcium-Magnesium-Vitamin D (CITRACAL CALCIUM+D) 600-40-500 MG-MG-UNIT TB24 Take by mouth.    [provider]  cholecalciferol (VITAMIN D3) 25 MCG (1000 UNIT) tablet Take 1,000 Units by mouth daily.    [provider]  cyclobenzaprine (FLEXERIL) 10 MG tablet TAKE 1/2 TO 1 TABLET BY MOUTH 3 TIMES A DAY AS NEEDED MUSCLE SPASMS 02/11/21   Silverio Decamp, MD  diphenhydramine-acetaminophen (TYLENOL PM) 25-500 MG TABS tablet Take 2 tablets by mouth at bedtime as needed.     [provider]  gabapentin (NEURONTIN) 300 MG capsule TAKE 1 CAPSULE BY MOUTH EVERYDAY AT BEDTIME 08/20/21   Silverio Decamp, MD  lidocaine  (XYLOCAINE) 2 % solution Use as directed 10-15 mLs in the mouth or throat every 3 (three) hours as needed (mouth/throat pain). 03/16/21   Emeterio Reeve, DO  loratadine (CLARITIN) 10 MG tablet Take 10 mg by mouth daily. Patient not taking: Reported on 07/27/2021    [provider]  LUTEIN ESTERS PO Take by mouth.    [provider]  meloxicam (MOBIC) 15 MG tablet TAKE 1 TABLET BY MOUTH EVERY DAY IN THE MORNING WITH FOOD 07/19/21   Emeterio Reeve, DO  traMADol (ULTRAM) 50 MG tablet Take 0.5-1 tablets (25-50 mg total) by mouth every 8 (eight) hours as needed for moderate pain. Maximum 6 tabs per day. 07/27/21   Emeterio Reeve, DO  valsartan-hydrochlorothiazide (DIOVAN-HCT) 80-12.5 MG tablet Take 1 tablet by mouth daily. 07/27/21   Emeterio Reeve, DO    Allergies:  Codeine, Kiwi extract, Other, Plum pulp, Tape, Tomato, and Prednisone  Review of Systems: Negative except per HPI.  Physical Exam: Alert and oriented, NAD Head and neck: no masses, normal alignment CV: pulse intact Pulm: no increased work of breathing, respirations even and unlabored Abdomen: non-distended Extremities: extremities warm and well perfused  LABS: Recent Results (from the past 2160 hour(s))  CBC     Status: Abnormal   Collection Time: 06/09/21 12:00 AM  Result Value Ref Range   WBC 9.0 3.8 - 10.8 Thousand/uL   RBC 3.59 (L) 3.80 - 5.10 Million/uL   Hemoglobin 11.6 (L) 11.7 - 15.5 g/dL  HCT 34.5 (L) 35.0 - 45.0 %   MCV 96.1 80.0 - 100.0 fL   MCH 32.3 27.0 - 33.0 pg   MCHC 33.6 32.0 - 36.0 g/dL   RDW 14.5 11.0 - 15.0 %   Platelets 309 140 - 400 Thousand/uL   MPV 9.1 7.5 - 12.5 fL  COMPLETE METABOLIC PANEL WITH GFR     Status: Abnormal   Collection Time: 06/09/21 12:00 AM  Result Value Ref Range   Glucose, Bld 88 65 - 99 mg/dL    Comment: .            Fasting reference interval .    BUN 11 7 - 25 mg/dL   Creat 0.81 0.50 - 0.99 mg/dL    Comment: For patients >28 years of age,  the reference limit for Creatinine is approximately 13% higher for people identified as African-American. .    GFR, Est Non African American 74 > OR = 60 mL/min/1.55m   GFR, Est African American 86 > OR = 60 mL/min/1.741m  BUN/Creatinine Ratio NOT APPLICABLE 6 - 22 (calc)   Sodium 139 135 - 146 mmol/L   Potassium 3.7 3.5 - 5.3 mmol/L   Chloride 99 98 - 110 mmol/L   CO2 32 20 - 32 mmol/L   Calcium 10.7 (H) 8.6 - 10.4 mg/dL   Total Protein 7.4 6.1 - 8.1 g/dL   Albumin 4.5 3.6 - 5.1 g/dL   Globulin 2.9 1.9 - 3.7 g/dL (calc)   AG Ratio 1.6 1.0 - 2.5 (calc)   Total Bilirubin 0.5 0.2 - 1.2 mg/dL   Alkaline phosphatase (APISO) 73 37 - 153 U/L   AST 19 10 - 35 U/L   ALT 13 6 - 29 U/L  Lipid panel     Status: None   Collection Time: 06/09/21 12:00 AM  Result Value Ref Range   Cholesterol 198 <200 mg/dL   HDL 81 > OR = 50 mg/dL   Triglycerides 113 <150 mg/dL   LDL Cholesterol (Calc) 96 mg/dL (calc)    Comment: Reference range: <100 . Desirable range <100 mg/dL for primary prevention;   <70 mg/dL for patients with CHD or diabetic patients  with > or = 2 CHD risk factors. . Marland KitchenDL-C is now calculated using the Martin-Hopkins  calculation, which is a validated novel method providing  better accuracy than the Friedewald equation in the  estimation of LDL-C.  MaCresenciano Genret al. JAAnnamaria Helling200093;[id number]  (http://education.[facility].com/faq/FAQ164)    Total CHOL/HDL Ratio 2.4 <5.0 (calc)   Non-HDL Cholesterol (Calc) 117 <130 mg/dL (calc)    Comment: For patients with diabetes plus 1 major ASCVD risk  factor, treating to a non-HDL-C goal of <100 mg/dL  (LDL-C of <70 mg/dL) is considered a therapeutic  option.   VITAMIN D 25 Hydroxy (Vit-D Deficiency, Fractures)     Status: Abnormal   Collection Time: 06/09/21 12:00 AM  Result Value Ref Range   Vit D, 25-Hydroxy 106 (H) 30 - 100 ng/mL    Comment: Vitamin D Status         25-OH Vitamin D: . Deficiency:                     <20 ng/mL Insufficiency:             20 - 29 ng/mL Optimal:                 > or = 30 ng/mL . For 25-OH Vitamin D testing  on patients on  D2-supplementation and patients for whom quantitation  of D2 and D3 fractions is required, the QuestAssureD(TM) 25-OH VIT D, (D2,D3), LC/MS/MS is recommended: order  code [id number] (patients >77yr). See Note 1 . Note 1 . For additional information, please refer to  http://education.[facility].com/faq/FAQ199  (This link is being provided for informational/ educational purposes only.)   BASIC METABOLIC PANEL WITH GFR     Status: None   Collection Time: 08/10/21 12:00 AM  Result Value Ref Range   Glucose, Bld 94 65 - 99 mg/dL    Comment: .            Fasting reference interval .    BUN 11 7 - 25 mg/dL   Creat 0.95 0.60 - 1.00 mg/dL   eGFR 64 > OR = 60 mL/min/1.739m   Comment: The eGFR is based on the CKD-EPI 2021 equation. To calculate  the new eGFR from a previous Creatinine or Cystatin C result, go to https://www.kidney.org/professionals/ kdoqi/gfr%5Fcalculator    BUN/Creatinine Ratio NOT APPLICABLE 6 - 22 (calc)   Sodium 137 135 - 146 mmol/L   Potassium 4.0 3.5 - 5.3 mmol/L   Chloride 100 98 - 110 mmol/L   CO2 29 20 - 32 mmol/L   Calcium 9.9 8.6 - 10.4 mg/dL     Complete History and Physical exam available in the office notes  JaOrene Desanctis

## 2021-08-23 NOTE — Telephone Encounter (Signed)
Patient return a call back stating she is doing much better on higher dose of Wellbutrin. Requesting for a refill to be sent to pharmacy on file.

## 2021-08-30 ENCOUNTER — Other Ambulatory Visit: Payer: Self-pay | Admitting: Osteopathic Medicine

## 2021-08-30 DIAGNOSIS — M47816 Spondylosis without myelopathy or radiculopathy, lumbar region: Secondary | ICD-10-CM

## 2021-08-30 MED ORDER — TRAMADOL HCL 50 MG PO TABS: 25.0000 mg | ORAL_TABLET | Freq: Three times a day (TID) | ORAL | 0 refills | Status: DC | PRN

## 2021-08-31 DIAGNOSIS — H524 Presbyopia: Secondary | ICD-10-CM | POA: Diagnosis not present

## 2021-08-31 DIAGNOSIS — H353131 Nonexudative age-related macular degeneration, bilateral, early dry stage: Secondary | ICD-10-CM | POA: Diagnosis not present

## 2021-09-01 DIAGNOSIS — M47816 Spondylosis without myelopathy or radiculopathy, lumbar region: Secondary | ICD-10-CM | POA: Diagnosis not present

## 2021-09-01 HISTORY — PX: RADIOFREQUENCY ABLATION: SHX2290

## 2021-09-07 ENCOUNTER — Ambulatory Visit (INDEPENDENT_AMBULATORY_CARE_PROVIDER_SITE_OTHER): Payer: Medicare HMO | Admitting: Sports Medicine

## 2021-09-07 ENCOUNTER — Other Ambulatory Visit: Payer: Self-pay

## 2021-09-07 DIAGNOSIS — M47816 Spondylosis without myelopathy or radiculopathy, lumbar region: Secondary | ICD-10-CM | POA: Diagnosis not present

## 2021-09-07 DIAGNOSIS — M19042 Primary osteoarthritis, left hand: Secondary | ICD-10-CM

## 2021-09-07 MED ORDER — ACETAMINOPHEN ER 650 MG PO TBCR: 650.0000 mg | EXTENDED_RELEASE_TABLET | Freq: Three times a day (TID) | ORAL | 3 refills | Status: DC | PRN

## 2021-09-07 NOTE — Assessment & Plan Note (Signed)
Left fifth DIP injection at the last visit did not result in significant improvements, she is scheduled for DIP fusion with Dr. Greta Doom. Follow-up with me as needed for this.

## 2021-09-07 NOTE — Progress Notes (Signed)
    Procedures performed today:    None.  Independent interpretation of notes and tests performed by another provider:   None.  Brief History, Exam, Impression, and Recommendations:    Facet arthritis of lumbar region Pinkey returns, she is a pleasant 70 year old female with multifactorial low back pain, multiple pain generators, to recap, we did a lumbar epidural that provided no relief, she did have central stenosis at L4-L5. She also has multilevel lumbar facet arthritis, we tried bilateral L4 S1 facet joint injections, she had 2 days of complete relief. She was seen by Dr. Brien Few and several days ago she had a lumbar facet radiofrequency ablation, she still has significant pain and I did explain to her that it could take 2 weeks for the postprocedural pain to resolve, she desires to come off of tramadol which I think is fine, she will do arthritis from Tylenol in the meantime and continue with gabapentin 1 time daily. It sounds like she did discuss an operative intervention with her neurosurgeon, I would certainly recommend she work with the pain clinic before considering operative intervention.  Osteoarthritis of left little finger Left fifth DIP injection at the last visit did not result in significant improvements, she is scheduled for DIP fusion with Dr. Greta Doom. Follow-up with me as needed for this.    ___________________________________________ Krista [name], M.D., ABFM., CAQSM. Primary Care and [place] Instructor of [place] [employer] [facility] [employer]

## 2021-09-07 NOTE — Assessment & Plan Note (Signed)
Krista [name] returns, she is a pleasant 70 year old female with multifactorial low back pain, multiple pain generators, to recap, we did a lumbar epidural that provided no relief, she did have central stenosis at L4-L5. She also has multilevel lumbar facet arthritis, we tried bilateral L4 S1 facet joint injections, she had 2 days of complete relief. She was seen by Dr. Brien Few and several days ago she had a lumbar facet radiofrequency ablation, she still has significant pain and I did explain to her that it could take 2 weeks for the postprocedural pain to resolve, she desires to come off of tramadol which I think is fine, she will do arthritis from Tylenol in the meantime and continue with gabapentin 1 time daily. It sounds like she did discuss an operative intervention with her neurosurgeon, I would certainly recommend she work with the pain clinic before considering operative intervention.

## 2021-09-09 ENCOUNTER — Encounter (HOSPITAL_BASED_OUTPATIENT_CLINIC_OR_DEPARTMENT_OTHER): Payer: Self-pay | Admitting: Orthopedic Surgery

## 2021-09-09 ENCOUNTER — Other Ambulatory Visit: Payer: Self-pay

## 2021-09-09 NOTE — Progress Notes (Signed)
Spoke w/ via phone for pre-op interview--- pt Lab needs dos----  [facility] and ekg             Lab results------ no COVID test -----patient states asymptomatic no test needed Arrive at ------- 1045 on 09-16-2021 NPO after MN NO Solid Food.  Clear liquids from MN until--- 0945 Med rec completed Medications to take morning of surgery ----- wellbutrin Diabetic medication ----- n/a Patient instructed no nail polish to be worn day of surgery Patient instructed to bring photo id and insurance card day of surgery Patient aware to have Driver (ride ) / caregiver  for 24 hours after surgery -- husband, randal Patient Special Instructions ----- n/a Pre-Op special Istructions ----- n/a Patient verbalized understanding of instructions that were given at this phone interview. Patient denies shortness of breath, chest pain, fever, cough at this phone interview.    Anesthesia Review: HTN;  COPD with no s&s per pt; left side facial nerve paralysis 1974 pt stated told possible bell's palsy or stroke but after testing had left mastoidectomy,  residual left facial asymmetry;  s/p cerebral aneurysm coiling 2010 with no residual;  recent radiofrequency ablation left side lumbar 09-01-2021, residual soreness;   Pt denies cardiac s&s, sob, and no peripheral.  PCP: Dr Delane Ginger. Myles Lipps 07-27-2021 epic) Chest x-ray : CT 08-02-2021 EKG :  no Activity level: denies sob w/ any activity

## 2021-09-10 DIAGNOSIS — R112 Nausea with vomiting, unspecified: Secondary | ICD-10-CM | POA: Diagnosis not present

## 2021-09-10 DIAGNOSIS — K219 Gastro-esophageal reflux disease without esophagitis: Secondary | ICD-10-CM | POA: Diagnosis not present

## 2021-09-10 DIAGNOSIS — R159 Full incontinence of feces: Secondary | ICD-10-CM | POA: Diagnosis not present

## 2021-09-10 DIAGNOSIS — K52832 Lymphocytic colitis: Secondary | ICD-10-CM | POA: Diagnosis not present

## 2021-09-15 NOTE — Progress Notes (Signed)
Left voice mail on phone to come in at 1000, instructed to call [phone] to verify that she got the message

## 2021-09-16 ENCOUNTER — Ambulatory Visit (HOSPITAL_BASED_OUTPATIENT_CLINIC_OR_DEPARTMENT_OTHER): Payer: Medicare HMO | Admitting: Anesthesiology

## 2021-09-16 ENCOUNTER — Encounter (HOSPITAL_BASED_OUTPATIENT_CLINIC_OR_DEPARTMENT_OTHER)
Admission: RE | Disposition: A | Discharge status: Home / Self Care | Payer: Self-pay | Source: Home / Self Care | Attending: Orthopedic Surgery

## 2021-09-16 ENCOUNTER — Encounter (HOSPITAL_BASED_OUTPATIENT_CLINIC_OR_DEPARTMENT_OTHER): Payer: Self-pay | Admitting: Orthopedic Surgery

## 2021-09-16 ENCOUNTER — Other Ambulatory Visit: Payer: Self-pay

## 2021-09-16 ENCOUNTER — Ambulatory Visit (HOSPITAL_BASED_OUTPATIENT_CLINIC_OR_DEPARTMENT_OTHER)
Admission: RE | Admit: 2021-09-16 | Discharge: 2021-09-16 | Disposition: A | Discharge status: Home / Self Care | Payer: Medicare HMO | Attending: Orthopedic Surgery | Admitting: Orthopedic Surgery

## 2021-09-16 DIAGNOSIS — M810 Age-related osteoporosis without current pathological fracture: Secondary | ICD-10-CM | POA: Insufficient documentation

## 2021-09-16 DIAGNOSIS — J449 Chronic obstructive pulmonary disease, unspecified: Secondary | ICD-10-CM | POA: Insufficient documentation

## 2021-09-16 DIAGNOSIS — Z8616 Personal history of COVID-19: Secondary | ICD-10-CM | POA: Insufficient documentation

## 2021-09-16 DIAGNOSIS — Z79899 Other long term (current) drug therapy: Secondary | ICD-10-CM | POA: Insufficient documentation

## 2021-09-16 DIAGNOSIS — M151 Heberden's nodes (with arthropathy): Secondary | ICD-10-CM | POA: Insufficient documentation

## 2021-09-16 DIAGNOSIS — M19042 Primary osteoarthritis, left hand: Secondary | ICD-10-CM

## 2021-09-16 DIAGNOSIS — Z885 Allergy status to narcotic agent status: Secondary | ICD-10-CM | POA: Diagnosis not present

## 2021-09-16 DIAGNOSIS — E782 Mixed hyperlipidemia: Secondary | ICD-10-CM | POA: Diagnosis not present

## 2021-09-16 DIAGNOSIS — E785 Hyperlipidemia, unspecified: Secondary | ICD-10-CM | POA: Insufficient documentation

## 2021-09-16 DIAGNOSIS — Z87891 Personal history of nicotine dependence: Secondary | ICD-10-CM | POA: Insufficient documentation

## 2021-09-16 DIAGNOSIS — I10 Essential (primary) hypertension: Secondary | ICD-10-CM | POA: Diagnosis not present

## 2021-09-16 DIAGNOSIS — Z888 Allergy status to other drugs, medicaments and biological substances status: Secondary | ICD-10-CM | POA: Insufficient documentation

## 2021-09-16 DIAGNOSIS — Z791 Long term (current) use of non-steroidal anti-inflammatories (NSAID): Secondary | ICD-10-CM | POA: Diagnosis not present

## 2021-09-16 HISTORY — DX: Presence of external hearing-aid: Z97.4

## 2021-09-16 HISTORY — DX: Personal history of other malignant neoplasm of skin: Z85.828

## 2021-09-16 HISTORY — DX: Heberden's nodes (with arthropathy): M15.1

## 2021-09-16 HISTORY — DX: Nausea with vomiting, unspecified: R11.2

## 2021-09-16 HISTORY — DX: Unspecified osteoarthritis, unspecified site: M19.90

## 2021-09-16 HISTORY — DX: Unspecified macular degeneration: H35.30

## 2021-09-16 HISTORY — DX: Lymphocytic colitis: K52.832

## 2021-09-16 HISTORY — DX: Spondylosis, unspecified: M47.9

## 2021-09-16 HISTORY — DX: Personal history of other malignant neoplasm of skin: Z98.890

## 2021-09-16 HISTORY — DX: Chronic obstructive pulmonary disease, unspecified: J44.9

## 2021-09-16 HISTORY — PX: DISTAL INTERPHALANGEAL JOINT FUSION: SHX6428

## 2021-09-16 LAB — POCT I-STAT, CHEM 8
BUN: 11 mg/dL (ref 8–23)
Calcium, Ion: 1.23 mmol/L (ref 1.15–1.40)
Chloride: 100 mmol/L (ref 98–111)
Creatinine, Ser: 0.9 mg/dL (ref 0.44–1.00)
Glucose, Bld: 90 mg/dL (ref 70–99)
HCT: 39 % (ref 36.0–46.0)
Hemoglobin: 13.3 g/dL (ref 12.0–15.0)
Potassium: 3.7 mmol/L (ref 3.5–5.1)
Sodium: 139 mmol/L (ref 135–145)
TCO2: 27 mmol/L (ref 22–32)

## 2021-09-16 SURGERY — DISTAL INTERPHALANGEAL JOINT FUSION
Anesthesia: Monitor Anesthesia Care | Site: Finger | Laterality: Left

## 2021-09-16 MED ORDER — LIDOCAINE 2% (20 MG/ML) 5 ML SYRINGE
INTRAMUSCULAR | Status: DC | PRN
  Administered 2021-09-16: 60 mg via INTRAVENOUS
  Administered 2021-09-16: 40 mg via INTRAVENOUS

## 2021-09-16 MED ORDER — HYDROCODONE-ACETAMINOPHEN 5-325 MG PO TABS: 1.0000 | ORAL_TABLET | Freq: Four times a day (QID) | ORAL | 0 refills | Status: AC | PRN

## 2021-09-16 MED ORDER — FENTANYL CITRATE (PF) 100 MCG/2ML IJ SOLN
INTRAMUSCULAR | Status: AC
  Filled 2021-09-16: qty 2

## 2021-09-16 MED ORDER — CEFAZOLIN SODIUM-DEXTROSE 2-4 GM/100ML-% IV SOLN
INTRAVENOUS | Status: AC
  Filled 2021-09-16: qty 100

## 2021-09-16 MED ORDER — LIDOCAINE-EPINEPHRINE (PF) 1 %-1:200000 IJ SOLN
INTRAMUSCULAR | Status: DC | PRN
  Administered 2021-09-16: 10 mL via INTRAMUSCULAR

## 2021-09-16 MED ORDER — PROPOFOL 10 MG/ML IV BOLUS
INTRAVENOUS | Status: DC | PRN
  Administered 2021-09-16: 30 mg via INTRAVENOUS

## 2021-09-16 MED ORDER — FENTANYL CITRATE (PF) 100 MCG/2ML IJ SOLN
INTRAMUSCULAR | Status: DC | PRN
  Administered 2021-09-16: 25 ug via INTRAVENOUS
  Administered 2021-09-16: 75 ug via INTRAVENOUS

## 2021-09-16 MED ORDER — LACTATED RINGERS IV SOLN: INTRAVENOUS | Status: DC

## 2021-09-16 MED ORDER — PROPOFOL 500 MG/50ML IV EMUL
INTRAVENOUS | Status: AC
  Filled 2021-09-16: qty 50

## 2021-09-16 MED ORDER — LIDOCAINE 2% (20 MG/ML) 5 ML SYRINGE
INTRAMUSCULAR | Status: AC
  Filled 2021-09-16: qty 5

## 2021-09-16 MED ORDER — ONDANSETRON HCL 4 MG/2ML IJ SOLN: 4.0000 mg | Freq: Once | INTRAMUSCULAR | Status: DC | PRN

## 2021-09-16 MED ORDER — PROPOFOL 500 MG/50ML IV EMUL
INTRAVENOUS | Status: DC | PRN
  Administered 2021-09-16: 100 ug/kg/min via INTRAVENOUS

## 2021-09-16 MED ORDER — PROPOFOL 10 MG/ML IV BOLUS
INTRAVENOUS | Status: AC
  Filled 2021-09-16: qty 20

## 2021-09-16 MED ORDER — FENTANYL CITRATE (PF) 100 MCG/2ML IJ SOLN: 25.0000 ug | INTRAMUSCULAR | Status: DC | PRN

## 2021-09-16 MED ORDER — PROPOFOL 1000 MG/100ML IV EMUL
INTRAVENOUS | Status: AC
  Filled 2021-09-16: qty 100

## 2021-09-16 MED ORDER — CEFAZOLIN SODIUM-DEXTROSE 2-4 GM/100ML-% IV SOLN
2.0000 g | INTRAVENOUS | Status: AC
  Administered 2021-09-16: 2 g via INTRAVENOUS

## 2021-09-16 SURGICAL SUPPLY — 43 items
ADH SKN CLS APL DERMABOND .7 (GAUZE/BANDAGES/DRESSINGS) ×1
APL SKNCLS STERI-STRIP NONHPOA (GAUZE/BANDAGES/DRESSINGS) ×1
BENZOIN TINCTURE PRP APPL 2/3 (GAUZE/BANDAGES/DRESSINGS) ×2 IMPLANT
BLADE SURG 15 STRL LF DISP TIS (BLADE) ×1 IMPLANT
BLADE SURG 15 STRL SS (BLADE) ×2
BNDG CMPR 9X4 STRL LF SNTH (GAUZE/BANDAGES/DRESSINGS) ×1
BNDG COHESIVE 1X5 TAN STRL LF (GAUZE/BANDAGES/DRESSINGS) ×2 IMPLANT
BNDG ELASTIC 4X5.8 VLCR STR LF (GAUZE/BANDAGES/DRESSINGS) ×2 IMPLANT
BNDG ESMARK 4X9 LF (GAUZE/BANDAGES/DRESSINGS) ×2 IMPLANT
BNDG GAUZE ELAST 4 BULKY (GAUZE/BANDAGES/DRESSINGS) ×2 IMPLANT
CORD BIPOLAR FORCEPS 12FT (ELECTRODE) IMPLANT
COVER BACK TABLE 60X90IN (DRAPES) ×2 IMPLANT
CUFF TOURN SGL QUICK 18X4 (TOURNIQUET CUFF) ×2 IMPLANT
DECANTER SPIKE VIAL GLASS SM (MISCELLANEOUS) IMPLANT
DERMABOND ADVANCED (GAUZE/BANDAGES/DRESSINGS) ×1
DERMABOND ADVANCED .7 DNX12 (GAUZE/BANDAGES/DRESSINGS) ×1 IMPLANT
DRAPE EXTREMITY T 121X128X90 (DISPOSABLE) ×2 IMPLANT
DRAPE OEC MINIVIEW 54X84 (DRAPES) ×2 IMPLANT
DRAPE SHEET LG 3/4 BI-LAMINATE (DRAPES) ×2 IMPLANT
DRAPE SURG 17X23 STRL (DRAPES) ×2 IMPLANT
GAUZE 4X4 16PLY ~~LOC~~+RFID DBL (SPONGE) ×2 IMPLANT
GAUZE SPONGE 4X4 12PLY STRL (GAUZE/BANDAGES/DRESSINGS) ×2 IMPLANT
GAUZE SPONGE 4X4 12PLY STRL LF (GAUZE/BANDAGES/DRESSINGS) ×2 IMPLANT
GAUZE XEROFORM 1X8 LF (GAUZE/BANDAGES/DRESSINGS) ×2 IMPLANT
GOWN STRL REUS W/TWL LRG LVL3 (GOWN DISPOSABLE) ×2 IMPLANT
K-WIRE .045X4 (WIRE) ×2 IMPLANT
K-WIRE DBL ENDED .35 (WIRE) ×2
KWIRE DBL ENDED .35 (WIRE) ×1 IMPLANT
NEEDLE HYPO 25X1 1.5 SAFETY (NEEDLE) ×2 IMPLANT
NS IRRIG 1000ML POUR BTL (IV SOLUTION) ×2 IMPLANT
PACK BASIN DAY SURGERY FS (CUSTOM PROCEDURE TRAY) ×2 IMPLANT
PAD CAST CTTN 4X4 STRL (SOFTGOODS) ×1 IMPLANT
PADDING CAST COTTON 4X4 STRL (SOFTGOODS) ×2
SLEEVE SCD COMPRESS KNEE MED (STOCKING) ×2 IMPLANT
STRIP CLOSURE SKIN 1/2X4 (GAUZE/BANDAGES/DRESSINGS) ×2 IMPLANT
SUCTION FRAZIER HANDLE 10FR (MISCELLANEOUS)
SUCTION TUBE FRAZIER 10FR DISP (MISCELLANEOUS) IMPLANT
SYR 10ML LL (SYRINGE) ×2 IMPLANT
SYR BULB EAR ULCER 3OZ GRN STR (SYRINGE) ×2 IMPLANT
TOWEL OR 17X26 10 PK STRL BLUE (TOWEL DISPOSABLE) ×2 IMPLANT
TRAY DSU PREP LF (CUSTOM PROCEDURE TRAY) ×2 IMPLANT
UNDERPAD 30X36 HEAVY ABSORB (UNDERPADS AND DIAPERS) ×2 IMPLANT
doubleended k wire .035 ×2 IMPLANT

## 2021-09-16 NOTE — Anesthesia Procedure Notes (Signed)
Procedure Name: MAC Date/Time: 09/16/2021 1:44 PM Performed by: Lieutenant Diego, CRNA Pre-anesthesia Checklist: Patient identified, Emergency Drugs available, Suction available, Patient being monitored and Timeout performed Patient Re-evaluated:Patient Re-evaluated prior to induction Oxygen Delivery Method: Simple face mask Preoxygenation: Pre-oxygenation with 100% oxygen Induction Type: IV induction

## 2021-09-16 NOTE — Discharge Instructions (Addendum)
Orthopaedic Hand Surgery Discharge Instructions  WEIGHT BEARING STATUS: Non weight bearing on operative extremity  DRESSINGS: Please keep your dressing/splint/cast clean and dry until your follow-up appointment. You may shower by placing a waterproof covering over your dressing/splint/cast. Contact your surgeon if your splint/cast gets wet. It will need to be changed to prevent skin breakdown.  PAIN CONTROL: First line medications for post operative pain control are Tylenol (acetaminophen) and Motrin (ibuprofen) if you are able to take these medications. If you have been prescribed a medication these can be taken as breakthrough pain medications. Please note that some narcotic pain medication have acetaminophen added and you should never consume more than 4,000mg  of acetaminophen in 24 hour period. Also please note that if you are given Toradol (ketoralac) you should not take similar medications simultaneously such as ibuprofen.   ICE/ELEVATION: Ice and elevate your injured extremity as needed. Avoid direct contact of ice with skin.  HOME MEDICATIONS: No changes have been made to your home medications.  FOLLOW UP: You will be called after surgery with an appointment date and time, however if you have not received a phone call within 3 days please call during regular office hours at [phone] to schedule a post operative appointment.  Please Seek Medical Attention if: Call MD for: pain or pressure in chest, jaw, arm, back, neck  Call MD for: temperature greater than 101 F for more than 24 hours  Call MD for: difficulty breathing Call MD for: Incision redness, bleeding, drainage  Call MD for: palpitations or feeling that the heart is racing  Call MD for: increased swelling in arm, leg, ankle, or abdomen  Call MD for: lightheadedness, dizziness, fainting Go to ED or call 911 if: chest pain does not go away after 3 nitroglycerin doses taken 5 min apart  Go to ED or call 911 for: any  uncontrolled bleeding  Go to ED or call 911 if: unable to reach physician  Discharge Medications: Allergies as of 09/16/2021       Reactions   Codeine Other (See Comments)   Hyperactivity "wired" super hyperactivity    Kiwi Extract Hives   Other Hives, Rash, Other (See Comments)   Nectarines--tongue swells   Plum Pulp Hives   Tape Hives, Rash   Tomato Hives   Prednisone Other (See Comments)   Irritability         Medication List     STOP taking these medications    acetaminophen 650 MG CR tablet Commonly known as: TYLENOL   traMADol 50 MG tablet Commonly known as: ULTRAM       TAKE these medications    buPROPion 300 MG 24 hr tablet Commonly known as: Wellbutrin XL Take 1 tablet (300 mg total) by mouth every morning.   cholecalciferol 25 MCG (1000 UNIT) tablet Commonly known as: VITAMIN D3 Take 1,000 Units by mouth daily.   CITRACAL CALCIUM+D 600-40-500 MG-MG-UNIT Tb24 Generic drug: Calcium-Magnesium-Vitamin D Take 2 tablets by mouth daily.   cyclobenzaprine 10 MG tablet Commonly known as: FLEXERIL TAKE 1/2 TO 1 TABLET BY MOUTH 3 TIMES A DAY AS NEEDED MUSCLE SPASMS What changed:  how much to take how to take this when to take this reasons to take this   diphenhydramine-acetaminophen 25-500 MG Tabs tablet Commonly known as: TYLENOL PM Take 2 tablets by mouth at bedtime.   famotidine 40 MG tablet Commonly known as: PEPCID Take 40 mg by mouth at bedtime.   gabapentin 300 MG capsule Commonly known as: NEURONTIN TAKE  1 CAPSULE BY MOUTH EVERYDAY AT BEDTIME What changed: See the new instructions.   HYDROcodone-acetaminophen 5-325 MG tablet Commonly known as: NORCO/VICODIN Take 1 tablet by mouth every 6 (six) hours as needed for up to 5 days for moderate pain.   ipratropium 0.03 % nasal spray Commonly known as: ATROVENT Place 2 sprays into both nostrils as needed for rhinitis.   lidocaine 2 % solution Commonly known as: XYLOCAINE Use as  directed 10-15 mLs in the mouth or throat every 3 (three) hours as needed (mouth/throat pain).   loratadine 10 MG tablet Commonly known as: CLARITIN Take 10 mg by mouth daily as needed.   LUTEIN ESTERS PO Take 1 tablet by mouth at bedtime.   meloxicam 15 MG tablet Commonly known as: MOBIC TAKE 1 TABLET BY MOUTH EVERY DAY IN THE MORNING WITH FOOD What changed: See the new instructions.   valsartan-hydrochlorothiazide 80-12.5 MG tablet Commonly known as: DIOVAN-HCT Take 1 tablet by mouth daily. What changed: when to take this          Matt Holmes, MD Orthopaedic Hand Surgery   Post Anesthesia Home Care Instructions  Activity: Get plenty of rest for the remainder of the day. A responsible individual must stay with you for 24 hours following the procedure.  For the next 24 hours, DO NOT: -Drive a car -Paediatric nurse -Drink alcoholic beverages -Take any medication unless instructed by your physician -Make any legal decisions or sign important papers.  Meals: Start with liquid foods such as gelatin or soup. Progress to regular foods as tolerated. Avoid greasy, spicy, heavy foods. If nausea and/or vomiting occur, drink only clear liquids until the nausea and/or vomiting subsides. Call your physician if vomiting continues.  Special Instructions/Symptoms: Your throat may feel dry or sore from the anesthesia or the breathing tube placed in your throat during surgery. If this causes discomfort, gargle with warm salt water. The discomfort should disappear within 24 hours.  If you had a scopolamine patch placed behind your ear for the management of post- operative nausea and/or vomiting:  1. The medication in the patch is effective for 72 hours, after which it should be removed.  Wrap patch in a tissue and discard in the trash. Wash hands thoroughly with soap and water. 2. You may remove the patch earlier than 72 hours if you experience unpleasant side effects which may  include dry mouth, dizziness or visual disturbances. 3. Avoid touching the patch. Wash your hands with soap and water after contact with the patch.

## 2021-09-16 NOTE — Anesthesia Preprocedure Evaluation (Signed)
Anesthesia Evaluation  Patient identified by MRN, date of birth, ID band Patient awake    Reviewed: Allergy & Precautions, NPO status , Patient's Chart, lab work & pertinent test results, reviewed documented beta blocker date and time   History of Anesthesia Complications (+) PONV and history of anesthetic complications  Airway Mallampati: II  TM Distance: >3 FB Neck ROM: Full    Dental no notable dental hx. (+) Dental Advisory Given, Teeth Intact, Caps   Pulmonary COPD, former smoker,  Covid 05/2021 mild to moderate Sx, resolved   Pulmonary exam normal breath sounds clear to auscultation       Cardiovascular hypertension, Pt. on medications Normal cardiovascular exam Rhythm:Regular Rate:Normal     Neuro/Psych Hx/o cerebral aneurysm 2010 S/P coiling in IR Hx/o Bell's palsy S/P mastoidectomy with facial paralysis post op negative psych ROS   GI/Hepatic Neg liver ROS, Hx/o lymphocytic colitis   Endo/Other  Hyperlipidemia  Renal/GU negative Renal ROS  negative genitourinary   Musculoskeletal  (+) Arthritis , Osteoarthritis,  Osteoporosis DDD lumbar spine OA left small finger DIP joint   Abdominal   Peds  Hematology negative hematology ROS (+)   Anesthesia Other Findings   Reproductive/Obstetrics                             Anesthesia Physical Anesthesia Plan  ASA: 2  Anesthesia Plan: MAC   Post-op Pain Management:    Induction: Intravenous  PONV Risk Score and Plan: 4 or greater and Treatment may vary due to age or medical condition, Propofol infusion and Ondansetron  Airway Management Planned: Natural Airway, Simple Face Mask and Nasal Cannula  Additional Equipment:   Intra-op Plan:   Post-operative Plan:   Informed Consent: I have reviewed the patients History and Physical, chart, labs and discussed the procedure including the risks, benefits and alternatives for the  proposed anesthesia with the patient or authorized representative who has indicated his/her understanding and acceptance.     Dental advisory given  Plan Discussed with: CRNA and Anesthesiologist  Anesthesia Plan Comments:         Anesthesia Quick Evaluation

## 2021-09-16 NOTE — Transfer of Care (Signed)
Immediate Anesthesia Transfer of Care Note  Patient: Krista [name]  Procedure(s) Performed: left small finger distal interphalangeal joint fusion (Left: Finger)  Patient Location: PACU  Anesthesia Type:MAC  Level of Consciousness: awake  Airway & Oxygen Therapy: Patient Spontanous Breathing and Patient connected to face mask oxygen  Post-op Assessment: Report given to RN and Post -op Vital signs reviewed and stable  Post vital signs: Reviewed and stable  Last Vitals:  Vitals Value Taken Time  BP 135/73 09/16/21 1419  Temp    Pulse 76 09/16/21 1425  Resp 11 09/16/21 1425  SpO2 98 % 09/16/21 1425  Vitals shown include unvalidated device data.  Last Pain:  Vitals:   09/16/21 1023  TempSrc: Oral  PainSc: 5       Patients Stated Pain Goal: 3 (81/77/11 6579)  Complications: No notable events documented.

## 2021-09-16 NOTE — Interval H&P Note (Signed)
History and Physical Interval Note:  09/16/2021 1:12 PM  Krista [name]  has presented today for surgery, with the diagnosis of left small finger DIP joint OA.  The various methods of treatment have been discussed with the patient and family. After consideration of risks, benefits and other options for treatment, the patient has consented to  Procedure(s) with comments: left small finger distal interphalangeal joint fusion (Left) - with MAC anesthesia as a surgical intervention.  The patient's history has been reviewed, patient examined, no change in status, stable for surgery.  I have reviewed the patient's chart and labs.  Questions were answered to the patient's satisfaction.     [name]

## 2021-09-17 ENCOUNTER — Encounter (HOSPITAL_BASED_OUTPATIENT_CLINIC_OR_DEPARTMENT_OTHER): Payer: Self-pay | Admitting: Orthopedic Surgery

## 2021-09-17 NOTE — Anesthesia Postprocedure Evaluation (Signed)
Anesthesia Post Note  Patient: Krista [name]  Procedure(s) Performed: left small finger distal interphalangeal joint fusion (Left: Finger)     Patient location during evaluation: PACU Anesthesia Type: MAC Level of consciousness: awake and alert Pain management: pain level controlled Vital Signs Assessment: post-procedure vital signs reviewed and stable Respiratory status: spontaneous breathing, nonlabored ventilation and respiratory function stable Cardiovascular status: stable and blood pressure returned to baseline Postop Assessment: no apparent nausea or vomiting Anesthetic complications: no   No notable events documented.  Last Vitals:  Vitals:   09/16/21 1430 09/16/21 1522  BP: 138/88 (!) 141/95  Pulse: 83 68  Resp: (!) 21 14  Temp:  37.1 C  SpO2: 97% 99%    Last Pain:  Vitals:   09/16/21 1522  TempSrc:   PainSc: 0-No pain                 Kristan Votta A.

## 2021-09-17 NOTE — Op Note (Signed)
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 female who has the above preoperative diagnosis. The patient has decided to proceed with surgical intervention.  Risks, benefits and alternatives of operative management were discussed including, but not limited to, risks of anesthesia complications, infection, pain, persistent symptoms, stiffness, need for future surgery.  The patient understands, agrees and elects to proceed with surgery.    DESCRIPTION OF PROCEDURE: The patient was met in the pre-operative area and their identity was verified.  The operative location and laterality was also verified and marked.  The patient was brought to the OR and was placed supine on the table.  After repeat patient identification with the operative team anesthesia was provided and the patient was prepped and draped in the usual sterile fashion.  A final timeout was performed verifying the correction patient, procedure, location and laterality.  The left small finger was elevated and exsanguinated and tourniquet inflated to 250 mmHg. A transverse incision was made in the dorsal DIP flexion crease. A combination of rongeur and currettes were used to remove remaining cartilage from the DIP joint. A k wire was placed in antegrade-retrograde fashion with an additional percutaneous oblique k wire while the reduction was maintained. C-arm fluoroscopy four views of the left small finger was performed and intra-operative  interpretation of the radiograph confirmed adequate reduction of the left small finger and placement of the hardware. The incision was then irrigated and closed with 4-0 chromic sutures. The tourniquet was deflated and the finger was pink and warm and well-perfused. A soft finger dressing was applied. All counts were correct x2. The patient was brought to PACU for recovery in stable condition.    Krista Holmes, MD

## 2021-09-22 DIAGNOSIS — M79642 Pain in left hand: Secondary | ICD-10-CM | POA: Diagnosis not present

## 2021-10-06 DIAGNOSIS — M19042 Primary osteoarthritis, left hand: Secondary | ICD-10-CM | POA: Diagnosis not present

## 2021-10-06 DIAGNOSIS — M79642 Pain in left hand: Secondary | ICD-10-CM | POA: Diagnosis not present

## 2021-10-08 ENCOUNTER — Ambulatory Visit (INDEPENDENT_AMBULATORY_CARE_PROVIDER_SITE_OTHER): Payer: Medicare HMO | Admitting: Physician Assistant

## 2021-10-08 ENCOUNTER — Encounter: Payer: Self-pay | Admitting: Physician Assistant

## 2021-10-08 VITALS — BP 134/69 | HR 108 | Temp 98.3°F | Ht 65.0 in | Wt 130.0 lb

## 2021-10-08 DIAGNOSIS — R051 Acute cough: Secondary | ICD-10-CM

## 2021-10-08 DIAGNOSIS — J039 Acute tonsillitis, unspecified: Secondary | ICD-10-CM | POA: Diagnosis not present

## 2021-10-08 DIAGNOSIS — J029 Acute pharyngitis, unspecified: Secondary | ICD-10-CM

## 2021-10-08 LAB — POCT INFLUENZA A/B
Influenza A, POC: NEGATIVE
Influenza B, POC: NEGATIVE

## 2021-10-08 MED ORDER — AMOXICILLIN-POT CLAVULANATE 875-125 MG PO TABS: 1.0000 | ORAL_TABLET | Freq: Two times a day (BID) | ORAL | 0 refills | Status: AC

## 2021-10-08 NOTE — Progress Notes (Signed)
Subjective:    Patient ID: Krista [name], female    DOB: [birth year], 70 y.o.   MRN: [id number]  HPI Pt is a 70 yo female with 2 days of severe sore throat and now her neck is tender to touch too. Exposure to grandson with strep 3 weeks ago. She is struggling to eat and drink. Tylenol and ibuprofen help minimally. Not checking temperature. She is having a cough from the drainage. No sinus pressure. She is having some left ear pain. She is having body aches. Covid fully vaccinated. Negative home covid test.  .. Active Ambulatory Problems    Diagnosis Date Noted   History of Bell's palsy 08/15/2017   History of osteoporosis 08/15/2017   History of colon polyps 08/15/2017   Diarrhea 08/15/2017   History of nonmelanoma skin cancer 08/15/2017   Brain aneurysm 08/15/2017   Bilateral hearing loss 08/15/2017   H/O hysterectomy for benign disease 08/15/2017   Cardiac risk counseling 09/05/2017   Essential hypertension 09/05/2017   Mixed hyperlipidemia 09/05/2017   Impacted cerumen of left ear 03/07/2018   History of left mastoidectomy 03/07/2018   Tympanosclerosis, left ear 03/07/2018   Acute bronchitis with COPD ([place]) 05/10/2018   Osteoarthritis of left little finger 07/31/2018   Inflammatory arthritis 03/13/2020   Facet arthritis of lumbar region 05/06/2020   Bilateral wrist pain 05/06/2020   DDD (degenerative disc disease), cervical 07/06/2020   Osteoporosis 11/05/2020   Resolved Ambulatory Problems    Diagnosis Date Noted   No Resolved Ambulatory Problems   Past Medical History:  Diagnosis Date   COPD (chronic obstructive pulmonary disease) ([place])    DDD (degenerative disc disease), lumbar    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    Facial asymmetry, acquired 1974   H/O: Bell's palsy 1974   History of basal cell carcinoma (BCC) excision    History of cerebral aneurysm 2010   History of Clostridioides difficile infection 2010    History of COVID-19 03/47/4259   History of Helicobacter pylori infection 2010   Hyperlipidemia    Hypertension    Lymphocytic colitis    Macular degeneration of both eyes    Non-intractable vomiting with nausea    OA (osteoarthritis)    PONV (postoperative nausea and vomiting)    Spondylosis, unspecified    Wears hearing aid in both ears      Review of Systems See HPI.     Objective:   Physical Exam Vitals reviewed.  Constitutional:      Appearance: She is well-developed.  HENT:     Head: Normocephalic.     Right Ear: Tympanic membrane and ear canal normal. No drainage, swelling or tenderness. No middle ear effusion. Tympanic membrane is not erythematous.     Left Ear: Tympanic membrane and ear canal normal. No drainage, swelling or tenderness.  No middle ear effusion. Tympanic membrane is not erythematous.     Nose: No congestion or rhinorrhea.     Mouth/Throat:     Mouth: Mucous membranes are moist. Oral lesions present.     Pharynx: Uvula midline. Pharyngeal swelling, oropharyngeal exudate, posterior oropharyngeal erythema and uvula swelling present.  Cardiovascular:     Rate and Rhythm: Normal rate and regular rhythm.     Heart sounds: Normal heart sounds.  Lymphadenopathy:     Cervical: Cervical adenopathy present.  Neurological:     General: No focal deficit present.     Mental Status: She is alert.  Psychiatric:  Mood and Affect: Mood normal.          Assessment & Plan:  Marland KitchenMarland KitchenNanako was seen today for sore throat and cough.  Diagnoses and all orders for this visit:  Tonsillitis -     amoxicillin-clavulanate (AUGMENTIN) 875-125 MG tablet; Take 1 tablet by mouth 2 (two) times daily for 10 days.  Acute cough -     POCT Influenza A/B  Sore throat -     POCT Influenza A/B -     amoxicillin-clavulanate (AUGMENTIN) 875-125 MG tablet; Take 1 tablet by mouth 2 (two) times daily for 10 days.  Flu was negative.  Home covid was negative.  Pt has enlarged  tonsils and exudate on left tonsil.  Treated with augmentin.  Continue pain control with tylenol and ibuprofen as well as salt water gargles.

## 2021-10-08 NOTE — Patient Instructions (Signed)

## 2021-10-11 ENCOUNTER — Encounter: Payer: Self-pay | Admitting: Physician Assistant

## 2021-10-20 NOTE — Telephone Encounter (Signed)
I spent 5 total minutes of online digital evaluation and management services in this patient-initiated request for online care. 

## 2021-11-03 DIAGNOSIS — M79642 Pain in left hand: Secondary | ICD-10-CM | POA: Diagnosis not present

## 2021-11-03 DIAGNOSIS — M19042 Primary osteoarthritis, left hand: Secondary | ICD-10-CM | POA: Diagnosis not present

## 2021-11-09 DIAGNOSIS — R159 Full incontinence of feces: Secondary | ICD-10-CM | POA: Diagnosis not present

## 2021-11-09 DIAGNOSIS — K52832 Lymphocytic colitis: Secondary | ICD-10-CM | POA: Diagnosis not present

## 2021-11-09 DIAGNOSIS — K219 Gastro-esophageal reflux disease without esophagitis: Secondary | ICD-10-CM | POA: Diagnosis not present

## 2021-11-09 DIAGNOSIS — R112 Nausea with vomiting, unspecified: Secondary | ICD-10-CM | POA: Diagnosis not present

## 2021-11-14 ENCOUNTER — Encounter: Payer: Self-pay | Admitting: Physician Assistant

## 2021-11-15 ENCOUNTER — Other Ambulatory Visit: Payer: Self-pay

## 2021-11-15 ENCOUNTER — Ambulatory Visit: Payer: Medicare HMO

## 2021-11-15 ENCOUNTER — Other Ambulatory Visit: Payer: Self-pay | Admitting: Physician Assistant

## 2021-11-15 ENCOUNTER — Ambulatory Visit (INDEPENDENT_AMBULATORY_CARE_PROVIDER_SITE_OTHER): Payer: Medicare HMO | Admitting: Family Medicine

## 2021-11-15 ENCOUNTER — Encounter: Payer: Self-pay | Admitting: Family Medicine

## 2021-11-15 VITALS — BP 133/75 | HR 91 | Ht 65.0 in | Wt 131.0 lb

## 2021-11-15 DIAGNOSIS — K1370 Unspecified lesions of oral mucosa: Secondary | ICD-10-CM | POA: Diagnosis not present

## 2021-11-15 DIAGNOSIS — Z1159 Encounter for screening for other viral diseases: Secondary | ICD-10-CM | POA: Diagnosis not present

## 2021-11-15 MED ORDER — TRIAMCINOLONE ACETONIDE 0.1 % MT PSTE: 1.0000 "application " | PASTE | Freq: Two times a day (BID) | OROMUCOSAL | 11 refills | Status: DC

## 2021-11-15 MED ORDER — LIDOCAINE VISCOUS HCL 2 % MT SOLN: 10.0000 mL | OROMUCOSAL | 1 refills | Status: DC | PRN

## 2021-11-15 NOTE — Patient Instructions (Signed)
HSV swab sent off to lab. We will update you with results.  Let's try the steroid paste on the lesions Continue with salt water rinses and liquid/soft diet Dermatology referral at your request.

## 2021-11-15 NOTE — Progress Notes (Signed)
Acute Office Visit  Subjective:    Patient ID: Krista [name], female    DOB: 05-Dec-1951, 70 y.o.   MRN: [id number]  Chief Complaint  Patient presents with   Mouth Lesions     HPI Patient is in today for oral lesions.   Patient reports GI recently started her on colestipol.  Within 2 doses she felt very weak/lightheaded and noticed some oral lesions and lip swelling which developed overnight.  She called the GI office and they told her to stop taking the medicine and to take Benadryl.  She reports that the generalized weakness and lightheadedness improved however the oral lesions persisted.  She reports the lesions are extremely painful (8/10 burning/stinging sensation).  She is having to primarily eat liquids or very soft foods because everything hurts.  Last night she even tried to sit with an ice pack on her mouth to see if that would help.  States that in the mornings she is waking up and her lips lesions are crusted and she is having to wipe them with a damp paper towel and use Vaseline.  She states this seems different from the previous mouth ulcers she was experiencing when she was seen at our office in May.  She was negative for HSV at the time, but would like [place] to swab these new lesions.  She denies any history of HSV.  She denies any fevers, genital ulcers, body aches, malaise, upper respiratory symptoms.  She has tried salt water rinses, peroxide rinses, baking soda rinses with no relief.  She states brushing her teeth and flossing is very painful.    Past Medical History:  Diagnosis Date   COPD (chronic obstructive pulmonary disease) ([place])    per pt no issues, chest CT in epic 07-29-2021   DDD (degenerative disc disease), cervical    DDD (degenerative disc disease), lumbar    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    Facial asymmetry, acquired 1974   left side paralysis   H/O: Bell's palsy 1974   per pt during pregnancy , left  side paralysis, told possible bell's palsy or stroke, after testing done had left mastoidectomy;  residual left side facialy asymmetry   History of basal cell carcinoma (BCC) excision    History of cerebral aneurysm 2010   s/p coiling  ;  per pt was an incidental finding on imaging   History of Clostridioides difficile infection 2010   treated   History of COVID-19 05/08/2021   positive result in care everywhere; per pt mild to moderate symptoms that resolved   History of Helicobacter pylori infection 2010   treated   Hyperlipidemia    Hypertension    followed by pcp   Lymphocytic colitis    followed by dr c. Shary Key (GI);  dx by biopsy 09-13-2017   Macular degeneration of both eyes    Non-intractable vomiting with nausea    followed by dr Shary Key   OA (osteoarthritis)    Osteoporosis    PONV (postoperative nausea and vomiting)    Spondylosis, unspecified    cervical and lumbar   Wears hearing aid in both ears     Past Surgical History:  Procedure Laterality Date   CARPAL TUNNEL RELEASE Bilateral    early 2000s   CATARACT EXTRACTION W/ INTRAOCULAR LENS IMPLANT Bilateral 2014   CEREBRAL ANEURYSM REPAIR  2010   endocerebral coiling   COLONOSCOPY  09/13/2017   DISTAL INTERPHALANGEAL JOINT FUSION Left 09/16/2021  Procedure: left small finger distal interphalangeal joint fusion;  Surgeon: [name], MD;  [facility];  Service: Orthopedics;  Laterality: Left;  with MAC anesthesia   ESOPHAGOGASTRODUODENOSCOPY  09/22/2020   FOOT SURGERY Bilateral    eary 2000s;  bilateral bunionectomy  and removal bone right 5th toe   MASTOIDECTOMY Left 1974   RADIOFREQUENCY ABLATION  09/01/2021   lumbar , left side   ROTATOR CUFF REPAIR Right 2014   SALPINGOOPHORECTOMY Bilateral 1995   w/ excision endometriosis via laparotomy   TONSILLECTOMY AND ADENOIDECTOMY  1970   TOTAL ABDOMINAL HYSTERECTOMY  1987   TRIGGER FINGER RELEASE Bilateral    early 2000s; one finger  each hand   WRIST GANGLION EXCISION Left    2000s    Family History  Problem Relation Age of Onset   Heart failure Mother    Cancer Father    Cancer Brother     Social History   Socioeconomic History   Marital status: Married    Spouse name: Randal   Number of children: 2   Years of education: 14   Highest education level: 12th grade  Occupational History   Occupation: retired    Comment: Retail buyer  Tobacco Use   Smoking status: Former    Years: 30.00    Types: Cigarettes    Quit date: 2010    Years since quitting: 12.9   Smokeless tobacco: Never  Vaping Use   Vaping Use: Never used  Substance and Sexual Activity   Alcohol use: Not Currently    Comment: Rarely   Drug use: Never   Sexual activity: Not on file  Other Topics Concern   Not on file  Social History Narrative   Walks 3 times a week   Social Determinants of Health   Financial Resource Strain: Not on file  Food Insecurity: Not on file  Transportation Needs: Not on file  Physical Activity: Not on file  Stress: Not on file  Social Connections: Not on file  Intimate Partner Violence: Not on file    Outpatient Medications Prior to Visit  Medication Sig Dispense Refill   buPROPion (WELLBUTRIN XL) 300 MG 24 hr tablet Take 1 tablet (300 mg total) by mouth every morning. (Patient taking differently: Take 300 mg by mouth every morning.) 90 tablet 3   Calcium-Magnesium-Vitamin D (CITRACAL CALCIUM+D) 600-40-500 MG-MG-UNIT TB24 Take 2 tablets by mouth daily.     cholecalciferol (VITAMIN D3) 25 MCG (1000 UNIT) tablet Take 1,000 Units by mouth daily.     cyclobenzaprine (FLEXERIL) 10 MG tablet TAKE 1/2 TO 1 TABLET BY MOUTH 3 TIMES A DAY AS NEEDED MUSCLE SPASMS (Patient taking differently: Take 5-10 mg by mouth 3 (three) times daily as needed. TAKE 1/2 TO 1 TABLET BY MOUTH 3 TIMES A DAY AS NEEDED MUSCLE SPASMS) 270 tablet 3   diphenhydramine-acetaminophen (TYLENOL PM) 25-500 MG TABS tablet Take 2 tablets  by mouth at bedtime.     famotidine (PEPCID) 40 MG tablet Take 40 mg by mouth at bedtime.     gabapentin (NEURONTIN) 300 MG capsule TAKE 1 CAPSULE BY MOUTH EVERYDAY AT BEDTIME (Patient taking differently: Take 300 mg by mouth at bedtime.) 90 capsule 1   ipratropium (ATROVENT) 0.03 % nasal spray Place 2 sprays into both nostrils as needed for rhinitis.     lidocaine (XYLOCAINE) 2 % solution Use as directed 10-15 mLs in the mouth or throat every 3 (three) hours as needed (mouth/throat pain). 100 mL 1  loratadine (CLARITIN) 10 MG tablet Take 10 mg by mouth daily as needed.     LUTEIN ESTERS PO Take 1 tablet by mouth at bedtime.     meloxicam (MOBIC) 15 MG tablet TAKE 1 TABLET BY MOUTH EVERY DAY IN THE MORNING WITH FOOD (Patient taking differently: Take 15 mg by mouth daily. TAKE 1 TABLET BY MOUTH EVERY DAY IN THE MORNING WITH FOOD) 90 tablet 1   valsartan-hydrochlorothiazide (DIOVAN-HCT) 80-12.5 MG tablet Take 1 tablet by mouth daily. (Patient taking differently: Take 1 tablet by mouth at bedtime.) 90 tablet 0   lidocaine (XYLOCAINE) 2 % solution Use as directed 10-15 mLs in the mouth or throat every 3 (three) hours as needed (mouth/throat pain). 100 mL 1   No facility-administered medications prior to visit.    Allergies  Allergen Reactions   Codeine Other (See Comments)    Hyperactivity "wired" super hyperactivity      Colestipol Swelling    Lips swollen/NO resp issues   Kiwi Extract Hives   Other Hives, Rash and Other (See Comments)    Nectarines--tongue swells    Plum Pulp Hives   Tape Hives and Rash   Tomato Hives   Prednisone Other (See Comments)    Irritability      Review of Systems All review of systems negative except what is listed in the HPI     Objective:    Physical Exam Vitals reviewed.  Constitutional:      Appearance: Normal appearance. She is normal weight.  Skin:    Comments: Oral lesions - slightly raised, tender - to gums, lips, tongue - see picture   Neurological:     General: No focal deficit present.     Mental Status: She is alert and oriented to person, place, and time. Mental status is at baseline.  Psychiatric:        Mood and Affect: Mood normal.        Behavior: Behavior normal.        Thought Content: Thought content normal.        Judgment: Judgment normal.          BP 133/75 (BP Location: Left Arm, Patient Position: Sitting, Cuff Size: Normal)   Pulse 91   Ht _0  (1.651 m)   Wt 131 lb (59.4 kg)   SpO2 98%   BMI 21.80 kg/m  Wt Readings from Last 3 Encounters:  11/15/21 131 lb (59.4 kg)  10/08/21 130 lb (59 kg)  09/16/21 130 lb 3.2 oz (59.1 kg)    Health Maintenance Due  Topic Date Due   Zoster Vaccines- Shingrix (2 of 2) 01/26/2021    There are no preventive care reminders to display for this patient.   Lab Results  Component Value Date   TSH 0.71 08/17/2017   Lab Results  Component Value Date   WBC 9.0 06/09/2021   HGB 13.3 09/16/2021   HCT 39.0 09/16/2021   MCV 96.1 06/09/2021   PLT 309 06/09/2021   Lab Results  Component Value Date   NA 139 09/16/2021   K 3.7 09/16/2021   CO2 29 08/10/2021   GLUCOSE 90 09/16/2021   BUN 11 09/16/2021   CREATININE 0.90 09/16/2021   BILITOT 0.5 06/09/2021   AST 19 06/09/2021   ALT 13 06/09/2021   PROT 7.4 06/09/2021   CALCIUM 9.9 08/10/2021   EGFR 64 08/10/2021   Lab Results  Component Value Date   CHOL 198 06/09/2021   Lab Results  Component Value  Date   HDL 81 06/09/2021   Lab Results  Component Value Date   LDLCALC 96 06/09/2021   Lab Results  Component Value Date   TRIG 113 06/09/2021   Lab Results  Component Value Date   CHOLHDL 2.4 06/09/2021   No results found for: HGBA1C     Assessment & Plan:   1. Oral lesion HSV swab sent off to lab. We will update her with results.  Steroid paste to lesions - states she cannot tolerate oral prednisone d/t irritability (no other allergic/anaphylactic responses); she thinks this may  be fine and is willing to try Continue with salt water rinses and liquid/soft diet Dermatology referral at patient request.   - triamcinolone (KENALOG) 0.1 % paste; Use as directed 1 application in the mouth or throat 2 (two) times daily.  Dispense: 5 g; Refill: 11 - Ambulatory referral to Dermatology   Discussed case with Dr. Madilyn Fireman.     Follow-up if symptoms worsen or fail to improve.   Purcell Nails Olevia Bowens, DNP, FNP-C

## 2021-11-17 ENCOUNTER — Other Ambulatory Visit: Payer: Self-pay | Admitting: Osteopathic Medicine

## 2021-11-17 DIAGNOSIS — L309 Dermatitis, unspecified: Secondary | ICD-10-CM | POA: Diagnosis not present

## 2021-11-17 DIAGNOSIS — L121 Cicatricial pemphigoid: Secondary | ICD-10-CM | POA: Diagnosis not present

## 2021-11-17 LAB — SURESWAB HSV, TYPE 1/2 DNA, PCR
HSV 1 DNA: NOT DETECTED
HSV 2 DNA: NOT DETECTED

## 2021-11-17 NOTE — Telephone Encounter (Signed)
[place] m/o requesting med refill for famotidine. Written by historical provider.

## 2021-11-22 DIAGNOSIS — H6122 Impacted cerumen, left ear: Secondary | ICD-10-CM | POA: Diagnosis not present

## 2021-11-22 DIAGNOSIS — K121 Other forms of stomatitis: Secondary | ICD-10-CM | POA: Diagnosis not present

## 2021-11-26 ENCOUNTER — Encounter: Payer: Self-pay | Admitting: Sports Medicine

## 2021-11-26 DIAGNOSIS — M47816 Spondylosis without myelopathy or radiculopathy, lumbar region: Secondary | ICD-10-CM

## 2021-11-26 DIAGNOSIS — M6740 Ganglion, unspecified site: Secondary | ICD-10-CM

## 2021-11-26 MED ORDER — GABAPENTIN 300 MG PO CAPS: 300.0000 mg | ORAL_CAPSULE | Freq: Every day | ORAL | 3 refills | Status: DC

## 2021-11-26 MED ORDER — MELOXICAM 15 MG PO TABS: 15.0000 mg | ORAL_TABLET | Freq: Every day | ORAL | 3 refills | Status: DC

## 2021-12-15 ENCOUNTER — Telehealth: Payer: Self-pay

## 2021-12-15 NOTE — Telephone Encounter (Signed)
Medication: denosumab (PROLIA) injection 60 mg Automatic authorization extension received from [facility] dates: 12/05/2021-12/04/2022  Patient aware via: MyChart Provider aware via this encounter

## 2021-12-17 DIAGNOSIS — M19042 Primary osteoarthritis, left hand: Secondary | ICD-10-CM | POA: Diagnosis not present

## 2021-12-23 ENCOUNTER — Other Ambulatory Visit: Payer: Self-pay | Admitting: Osteopathic Medicine

## 2021-12-24 NOTE — Telephone Encounter (Signed)
Needs appt to establish care with another provider.

## 2021-12-24 NOTE — Telephone Encounter (Signed)
Last sent in August for 90 days. Please advise on refill.

## 2021-12-24 NOTE — Telephone Encounter (Signed)
Patient has scheduled a transfer of care visit with [name] for the next available , 02/09/22, and wanted to make sure she had enough refills to get her by to this appt. AM

## 2021-12-31 ENCOUNTER — Ambulatory Visit (INDEPENDENT_AMBULATORY_CARE_PROVIDER_SITE_OTHER): Payer: Medicare HMO | Admitting: Medical-Surgical

## 2021-12-31 ENCOUNTER — Other Ambulatory Visit: Payer: Self-pay | Admitting: Medical-Surgical

## 2021-12-31 ENCOUNTER — Encounter: Payer: Self-pay | Admitting: Medical-Surgical

## 2021-12-31 ENCOUNTER — Other Ambulatory Visit: Payer: Self-pay

## 2021-12-31 VITALS — BP 137/74 | HR 79 | Resp 20 | Ht 65.0 in | Wt 130.0 lb

## 2021-12-31 DIAGNOSIS — M5030 Other cervical disc degeneration, unspecified cervical region: Secondary | ICD-10-CM | POA: Diagnosis not present

## 2021-12-31 DIAGNOSIS — I10 Essential (primary) hypertension: Secondary | ICD-10-CM | POA: Diagnosis not present

## 2021-12-31 DIAGNOSIS — F439 Reaction to severe stress, unspecified: Secondary | ICD-10-CM

## 2021-12-31 DIAGNOSIS — Z1231 Encounter for screening mammogram for malignant neoplasm of breast: Secondary | ICD-10-CM

## 2021-12-31 DIAGNOSIS — Z7689 Persons encountering health services in other specified circumstances: Secondary | ICD-10-CM

## 2021-12-31 NOTE — Progress Notes (Signed)
°  HPI with pertinent ROS:   CC: Transfer of care  HPI: Pleasant 71 year old female presenting today for transfer of care to a new PCP.  She is also following up on:  Facial paralysis-has had facial paralysis for the last 48 years.  She has been doing some independent research and has found a couple of different providers who she would like referrals to see.  She does not have the names available but both of them are in maxillofacial surgery.  She will send me a MyChart message with their names so that referrals can be placed.  Stress-Notes that she has been increasingly stressed at home.  She is taking Wellbutrin 300 mg daily which is helping some.  She is tolerating the medication well without side effects.  Notes that her husband just had to have another cardiac procedure yesterday which is his third 1.  She does have concerns over his health as well as her own.  Hypertension-has a machine at home but does not use it much.  Does not add excess salt to foods but does admit to adding some when she is cooking.  She gets limited exercise due to recent musculoskeletal concerns but has plans to get back to the gym if her husband's health turns out stable. Denies CP, SOB, palpitations, lower extremity edema, dizziness, headaches, or vision changes.  I reviewed the past medical history, family history, social history, surgical history, and allergies today and no changes were needed.  Please see the problem list section below in epic for further details.   Physical exam:   General: Well Developed, well nourished, and in no acute distress.  Neuro: Alert and oriented x3.  HEENT: Normocephalic, atraumatic.  Skin: Warm and dry. Cardiac: Regular rate and rhythm, no murmurs rubs or gallops, no lower extremity edema.  Respiratory: Clear to auscultation bilaterally. Not using accessory muscles, speaking in full sentences.  Impression and Recommendations:    1. Encounter to establish care Reviewed  available information and discussed care concerns with patient.   2. Essential hypertension BP slightly higher than goal. Recheck 134/74. Continue Valsartan-HCTZ 80-12.5mg  daily as prescribed.  Recommend checking blood pressures at home at least 2-3 times weekly.  If consistently higher than 130/80, we will need to make adjustments.  Discussed moving her dose to 25 mg of hydrochlorothiazide in the combination pill.  She would like to finish out her current supply of blood pressure medicines prior to making changes.  3. DDD (degenerative disc disease), cervical Continue gabapentin 300 mg at bedtime.  Continue meloxicam 15 mg daily as needed.  4. Stress at home Continue Wellbutrin 300 mg daily.  Would possibly benefit from counseling.  Return in about 6 months (around 06/30/2022) for chronic disease follow up. ___________________________________________ Clearnce Sorrel, DNP, APRN, FNP-BC [facility] [place]

## 2022-01-01 ENCOUNTER — Encounter: Payer: Self-pay | Admitting: Medical-Surgical

## 2022-01-01 DIAGNOSIS — Z8669 Personal history of other diseases of the nervous system and sense organs: Secondary | ICD-10-CM

## 2022-01-01 DIAGNOSIS — G510 Bell's palsy: Secondary | ICD-10-CM

## 2022-01-06 ENCOUNTER — Ambulatory Visit (INDEPENDENT_AMBULATORY_CARE_PROVIDER_SITE_OTHER): Payer: Medicare HMO

## 2022-01-06 ENCOUNTER — Other Ambulatory Visit: Payer: Self-pay

## 2022-01-06 DIAGNOSIS — Z1231 Encounter for screening mammogram for malignant neoplasm of breast: Secondary | ICD-10-CM | POA: Diagnosis not present

## 2022-01-18 ENCOUNTER — Encounter: Payer: Self-pay | Admitting: Medical-Surgical

## 2022-01-20 ENCOUNTER — Other Ambulatory Visit: Payer: Self-pay

## 2022-01-20 ENCOUNTER — Emergency Department
Admission: EM | Admit: 2022-01-20 | Discharge: 2022-01-20 | Disposition: A | Discharge status: Home / Self Care | Payer: Medicare HMO | Source: Home / Self Care

## 2022-01-20 DIAGNOSIS — M810 Age-related osteoporosis without current pathological fracture: Secondary | ICD-10-CM | POA: Diagnosis not present

## 2022-01-20 DIAGNOSIS — J0100 Acute maxillary sinusitis, unspecified: Secondary | ICD-10-CM | POA: Diagnosis not present

## 2022-01-20 DIAGNOSIS — J029 Acute pharyngitis, unspecified: Secondary | ICD-10-CM

## 2022-01-20 DIAGNOSIS — J449 Chronic obstructive pulmonary disease, unspecified: Secondary | ICD-10-CM | POA: Diagnosis not present

## 2022-01-20 LAB — POCT RAPID STREP A (OFFICE): Rapid Strep A Screen: NEGATIVE

## 2022-01-20 MED ORDER — AMOXICILLIN-POT CLAVULANATE 875-125 MG PO TABS: 1.0000 | ORAL_TABLET | Freq: Two times a day (BID) | ORAL | 0 refills | Status: DC

## 2022-01-20 NOTE — ED Provider Notes (Signed)
[name]    CSN: [id number] Arrival date & time: 01/20/22  1402      History   Chief Complaint Chief Complaint  Patient presents with   Otalgia   Sore Throat    HPI Krista [name] is a 71 y.o. female.   HPI 71 year old female presents with left ear pain for 5 days and sore throat for 3 days.  Patient denies recent COVID-19 test.  PMH significant for COPD and cerebral aneurysm.  Past Medical History:  Diagnosis Date   COPD (chronic obstructive pulmonary disease) ([place])    per pt no issues, chest CT in epic 07-29-2021   DDD (degenerative disc disease), cervical    DDD (degenerative disc disease), lumbar    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    Facial asymmetry, acquired 1974   left side paralysis   H/O: Bell's palsy 1974   per pt during pregnancy , left side paralysis, told possible bell's palsy or stroke, after testing done had left mastoidectomy;  residual left side facialy asymmetry   History of basal cell carcinoma (BCC) excision    History of cerebral aneurysm 2010   s/p coiling  ;  per pt was an incidental finding on imaging   History of Clostridioides difficile infection 2010   treated   History of COVID-19 05/08/2021   positive result in care everywhere; per pt mild to moderate symptoms that resolved   History of Helicobacter pylori infection 2010   treated   Hyperlipidemia    Hypertension    followed by pcp   Lymphocytic colitis    followed by dr c. Shary Key (GI);  dx by biopsy 09-13-2017   Macular degeneration of both eyes    Non-intractable vomiting with nausea    followed by dr Shary Key   OA (osteoarthritis)    Osteoporosis    PONV (postoperative nausea and vomiting)    Spondylosis, unspecified    cervical and lumbar   Wears hearing aid in both ears     Patient Active Problem List   Diagnosis Date Noted   Osteoporosis 11/05/2020   DDD (degenerative disc disease), cervical 07/06/2020    Facet arthritis of lumbar region 05/06/2020   Bilateral wrist pain 05/06/2020   Inflammatory arthritis 03/13/2020   Osteoarthritis of left little finger 07/31/2018   Acute bronchitis with COPD ([place]) 05/10/2018   Impacted cerumen of left ear 03/07/2018   History of left mastoidectomy 03/07/2018   Tympanosclerosis, left ear 03/07/2018   Cardiac risk counseling 09/05/2017   Essential hypertension 09/05/2017   Mixed hyperlipidemia 09/05/2017   History of Bell's palsy 08/15/2017   History of osteoporosis 08/15/2017   History of colon polyps 08/15/2017   History of nonmelanoma skin cancer 08/15/2017   Brain aneurysm 08/15/2017   Bilateral hearing loss 08/15/2017   H/O hysterectomy for benign disease 08/15/2017    Past Surgical History:  Procedure Laterality Date   CARPAL TUNNEL RELEASE Bilateral    early 2000s   CATARACT EXTRACTION W/ INTRAOCULAR LENS IMPLANT Bilateral 2014   CEREBRAL ANEURYSM REPAIR  2010   endocerebral coiling   COLONOSCOPY  09/13/2017   DISTAL INTERPHALANGEAL JOINT FUSION Left 09/16/2021   Procedure: left small finger distal interphalangeal joint fusion;  Surgeon: [name], MD;  Location: [facility];  Service: Orthopedics;  Laterality: Left;  with MAC anesthesia   ESOPHAGOGASTRODUODENOSCOPY  09/22/2020   FOOT SURGERY Bilateral    eary 2000s;  bilateral bunionectomy  and removal bone  right 5th toe   MASTOIDECTOMY Left 1974   RADIOFREQUENCY ABLATION  09/01/2021   lumbar , left side   ROTATOR CUFF REPAIR Right 2014   SALPINGOOPHORECTOMY Bilateral 1995   w/ excision endometriosis via laparotomy   TONSILLECTOMY AND ADENOIDECTOMY  1970   TOTAL ABDOMINAL HYSTERECTOMY  1987   TRIGGER FINGER RELEASE Bilateral    early 2000s; one finger each hand   WRIST GANGLION EXCISION Left    2000s    OB History   No obstetric history on file.      Home Medications    Prior to Admission medications   Medication Sig Start Date End Date Taking?  Authorizing Provider  amoxicillin-clavulanate (AUGMENTIN) 875-125 MG tablet Take 1 tablet by mouth 2 (two) times daily. 01/20/22  Yes Eliezer Lofts, FNP  buPROPion (WELLBUTRIN XL) 300 MG 24 hr tablet Take 1 tablet (300 mg total) by mouth every morning. Patient taking differently: Take 300 mg by mouth every morning. 08/23/21   Emeterio Reeve, DO  Calcium-Magnesium-Vitamin D (CITRACAL CALCIUM+D) 600-40-500 MG-MG-UNIT TB24 Take 2 tablets by mouth daily.    [provider]  cholecalciferol (VITAMIN D3) 25 MCG (1000 UNIT) tablet Take 1,000 Units by mouth daily.    [provider]  cyclobenzaprine (FLEXERIL) 10 MG tablet TAKE 1/2 TO 1 TABLET BY MOUTH 3 TIMES A DAY AS NEEDED MUSCLE SPASMS Patient taking differently: Take 5-10 mg by mouth 3 (three) times daily as needed. TAKE 1/2 TO 1 TABLET BY MOUTH 3 TIMES A DAY AS NEEDED MUSCLE SPASMS 02/11/21   Silverio Decamp, MD  diphenhydramine-acetaminophen (TYLENOL PM) 25-500 MG TABS tablet Take 2 tablets by mouth at bedtime.    [provider]  famotidine (PEPCID) 40 MG tablet TAKE 1 TABLET EVERY DAY (APPOINTMENT IS NEEDED FOR REFILLS) 11/17/21   Breeback, Jade L, PA-C  gabapentin (NEURONTIN) 300 MG capsule Take 1 capsule (300 mg total) by mouth at bedtime. 11/26/21   Silverio Decamp, MD  ipratropium (ATROVENT) 0.03 % nasal spray Place 2 sprays into both nostrils as needed for rhinitis.    [provider]  lidocaine (XYLOCAINE) 2 % solution Use as directed 10-15 mLs in the mouth or throat every 3 (three) hours as needed (mouth/throat pain). 11/15/21   Breeback, Jade L, PA-C  loratadine (CLARITIN) 10 MG tablet Take 10 mg by mouth daily as needed.    [provider]  LUTEIN ESTERS PO Take 1 tablet by mouth at bedtime.    [provider]  meloxicam (MOBIC) 15 MG tablet Take 1 tablet (15 mg total) by mouth daily. TAKE 1 TABLET BY MOUTH EVERY DAY IN THE MORNING WITH FOOD 11/26/21   Silverio Decamp, MD  triamcinolone (KENALOG) 0.1 % paste Use as directed 1 application in the mouth or throat 2 (two) times daily. 11/15/21   Terrilyn Saver, NP  valsartan-hydrochlorothiazide (DIOVAN-HCT) 80-12.5 MG tablet Take 1 tablet by mouth at bedtime. 12/24/21   [name], NP    Family History Family History  Problem Relation Age of Onset   Heart failure Mother    Cancer Father    Cancer Brother     Social History Social History   Tobacco Use   Smoking status: Former    Years: 30.00    Types: Cigarettes    Quit date: 2010    Years since quitting: 13.1   Smokeless tobacco: Never  Vaping Use   Vaping Use: Never used  Substance Use Topics   Alcohol use: Not  Currently    Comment: Rarely   Drug use: Never     Allergies   Codeine, Colestipol, Kiwi extract, Other, Plum pulp, Tape, Tomato, and Prednisone   Review of Systems Review of Systems  HENT:  Positive for ear pain and sore throat.   All other systems reviewed and are negative.   Physical Exam Triage Vital Signs ED Triage Vitals  Enc Vitals Group     BP 01/20/22 1435 (!) 147/85     Pulse Rate 01/20/22 1435 92     Resp 01/20/22 1435 20     Temp 01/20/22 1435 97.6 F (36.4 C)     Temp Source 01/20/22 1435 Oral     SpO2 01/20/22 1435 99 %     Weight 01/20/22 1432 130 lb (59 kg)     Height 01/20/22 1432 _0  (1.651 m)     Head Circumference --      Peak Flow --      Pain Score 01/20/22 1432 7     Pain Loc --      Pain Edu? --      Excl. in [place]? --    No data found.  Updated Vital Signs BP (!) 147/85 (BP Location: Right Arm)    Pulse 92    Temp 97.6 F (36.4 C) (Oral)    Resp 20    Ht _1  (1.651 m)    Wt 130 lb (59 kg)    SpO2 99%    BMI 21.63 kg/m     Physical Exam Vitals and nursing note reviewed.  Constitutional:      General: She is not in acute distress.    Appearance: Normal appearance. She is well-developed and normal weight. She is not ill-appearing.  HENT:     Head: Normocephalic and  atraumatic.     Right Ear: Tympanic membrane and external ear normal.     Left Ear: Tympanic membrane and external ear normal.     Ears:     Comments: Moderate to significant eustachian tube dysfunction noted bilaterally    Mouth/Throat:     Mouth: Mucous membranes are moist. No oral lesions.     Pharynx: Oropharynx is clear. Uvula midline. Posterior oropharyngeal erythema present. No oropharyngeal exudate or uvula swelling.  Eyes:     Conjunctiva/sclera: Conjunctivae normal.     Pupils: Pupils are equal, round, and reactive to light.  Cardiovascular:     Rate and Rhythm: Normal rate and regular rhythm.     Pulses: Normal pulses.     Heart sounds: Normal heart sounds.  Pulmonary:     Effort: Pulmonary effort is normal.     Breath sounds: Normal breath sounds.  Musculoskeletal:     Cervical back: Normal range of motion and neck supple.  Skin:    General: Skin is warm and dry.  Neurological:     General: No focal deficit present.     Mental Status: She is alert and oriented to person, place, and time.     UC Treatments / Results  Labs (all labs ordered are listed, but only abnormal results are displayed) Labs Reviewed  POCT RAPID STREP A (OFFICE)    EKG   Radiology No results found.  Procedures Procedures (including critical care time)  Medications Ordered in UC Medications - No data to display  Initial Impression / Assessment and Plan / UC Course  I have reviewed the triage vital signs and the nursing notes.  Pertinent labs & imaging results that  were available during my care of the patient were reviewed by me and considered in my medical decision making (see chart for details).     MDM: 1.  Subacute maxillary sinusitis-Rx'd Augmentin; 2.  Pharyngitis-Rx'd Augmentin. Advised patient to take medication as directed with food to completion.  Encouraged patient to increase daily water intake while taking these medications.  Patient discharged home, hemodynamically  stable. Final Clinical Impressions(s) / UC Diagnoses   Final diagnoses:  Pharyngitis, unspecified etiology  Subacute maxillary sinusitis     Discharge Instructions      Advised patient to take medication as directed with food to completion.  Encouraged patient to increase daily water intake while taking these medications.     ED Prescriptions     Medication Sig Dispense Auth. Provider   amoxicillin-clavulanate (AUGMENTIN) 875-125 MG tablet Take 1 tablet by mouth 2 (two) times daily. 14 tablet Eliezer Lofts, FNP      PDMP not reviewed this encounter.   Eliezer Lofts, [place] 01/20/22 1511

## 2022-01-20 NOTE — Discharge Instructions (Addendum)
Advised patient to take medication as directed with food to completion.  Encouraged patient to increase daily water intake while taking these medications.

## 2022-01-20 NOTE — Progress Notes (Signed)
Labs ordered for Prolia injection.

## 2022-01-20 NOTE — ED Triage Notes (Signed)
Pt presents to Urgent Care with c/o L ear pain x approx 5 days and sore throat x 3 days. Has not done COVID test.

## 2022-01-21 ENCOUNTER — Other Ambulatory Visit: Payer: Self-pay | Admitting: Medical-Surgical

## 2022-01-21 DIAGNOSIS — R944 Abnormal results of kidney function studies: Secondary | ICD-10-CM

## 2022-01-21 LAB — COMPLETE METABOLIC PANEL WITH GFR
AG Ratio: 1.7 (calc) (ref 1.0–2.5)
ALT: 18 U/L (ref 6–29)
AST: 20 U/L (ref 10–35)
Albumin: 4.3 g/dL (ref 3.6–5.1)
Alkaline phosphatase (APISO): 58 U/L (ref 37–153)
BUN/Creatinine Ratio: 13 (calc) (ref 6–22)
BUN: 14 mg/dL (ref 7–25)
CO2: 32 mmol/L (ref 20–32)
Calcium: 10.2 mg/dL (ref 8.6–10.4)
Chloride: 104 mmol/L (ref 98–110)
Creat: 1.08 mg/dL — ABNORMAL HIGH (ref 0.60–1.00)
Globulin: 2.6 g/dL (calc) (ref 1.9–3.7)
Glucose, Bld: 82 mg/dL (ref 65–99)
Potassium: 3.6 mmol/L (ref 3.5–5.3)
Sodium: 144 mmol/L (ref 135–146)
Total Bilirubin: 0.3 mg/dL (ref 0.2–1.2)
Total Protein: 6.9 g/dL (ref 6.1–8.1)
eGFR: 55 mL/min/{1.73_m2} — ABNORMAL LOW (ref >=60)

## 2022-01-26 DIAGNOSIS — L309 Dermatitis, unspecified: Secondary | ICD-10-CM | POA: Diagnosis not present

## 2022-01-26 DIAGNOSIS — K120 Recurrent oral aphthae: Secondary | ICD-10-CM | POA: Diagnosis not present

## 2022-01-28 ENCOUNTER — Other Ambulatory Visit: Payer: Self-pay

## 2022-01-28 ENCOUNTER — Ambulatory Visit (INDEPENDENT_AMBULATORY_CARE_PROVIDER_SITE_OTHER): Payer: Medicare HMO | Admitting: Medical-Surgical

## 2022-01-28 VITALS — BP 167/70 | HR 78

## 2022-01-28 DIAGNOSIS — M810 Age-related osteoporosis without current pathological fracture: Secondary | ICD-10-CM | POA: Diagnosis not present

## 2022-01-28 DIAGNOSIS — I10 Essential (primary) hypertension: Secondary | ICD-10-CM

## 2022-01-28 DIAGNOSIS — K120 Recurrent oral aphthae: Secondary | ICD-10-CM | POA: Diagnosis not present

## 2022-01-28 MED ORDER — DENOSUMAB 60 MG/ML ~~LOC~~ SOSY
60.0000 mg | PREFILLED_SYRINGE | Freq: Once | SUBCUTANEOUS | Status: AC
  Administered 2022-01-28: 60 mg via SUBCUTANEOUS

## 2022-01-28 MED ORDER — VALSARTAN-HYDROCHLOROTHIAZIDE 160-12.5 MG PO TABS: 1.0000 | ORAL_TABLET | Freq: Every day | ORAL | 3 refills | Status: DC

## 2022-01-28 NOTE — Progress Notes (Signed)
Blood pressure uncontrolled on arrival and recheck with systolics in the [facility].  Changing to valsartan-HCTZ 160-12.5 mg daily.  Prescription sent to pharmacy.  Monitor blood pressure at home with goal of 130/80 or less.  Return in 2 weeks for nurse visit for blood pressure check.  ___________________________________________ Clearnce Sorrel, DNP, APRN, FNP-BC [facility] [place]

## 2022-01-28 NOTE — Progress Notes (Signed)
Established Patient Office Visit  Subjective:  Patient ID: Krista [name], female    DOB: 01-05-1951  Age: 71 y.o. MRN: [id number]  CC:  Chief Complaint  Patient presents with   Osteoporosis   Hypertension    HPI Krista [name] presents for Prolia injection. Denies any problems with medication.  Hypertension - Blood pressure is elevated today. He states she is taking the valsartan-HCTZ 80-12.5 mg daily. Denies chest pain, shortness of breath or dizziness.   Past Medical History:  Diagnosis Date   COPD (chronic obstructive pulmonary disease) ([place])    per pt no issues, chest CT in epic 07-29-2021   DDD (degenerative disc disease), cervical    DDD (degenerative disc disease), lumbar    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    Facial asymmetry, acquired 1974   left side paralysis   H/O: Bell's palsy 1974   per pt during pregnancy , left side paralysis, told possible bell's palsy or stroke, after testing done had left mastoidectomy;  residual left side facialy asymmetry   History of basal cell carcinoma (BCC) excision    History of cerebral aneurysm 2010   s/p coiling  ;  per pt was an incidental finding on imaging   History of Clostridioides difficile infection 2010   treated   History of COVID-19 05/08/2021   positive result in care everywhere; per pt mild to moderate symptoms that resolved   History of Helicobacter pylori infection 2010   treated   Hyperlipidemia    Hypertension    followed by pcp   Lymphocytic colitis    followed by dr c. Shary Key (GI);  dx by biopsy 09-13-2017   Macular degeneration of both eyes    Non-intractable vomiting with nausea    followed by dr Shary Key   OA (osteoarthritis)    Osteoporosis    PONV (postoperative nausea and vomiting)    Spondylosis, unspecified    cervical and lumbar   Wears hearing aid in both ears     Past Surgical History:  Procedure Laterality Date   CARPAL TUNNEL RELEASE  Bilateral    early 2000s   CATARACT EXTRACTION W/ INTRAOCULAR LENS IMPLANT Bilateral 2014   CEREBRAL ANEURYSM REPAIR  2010   endocerebral coiling   COLONOSCOPY  09/13/2017   DISTAL INTERPHALANGEAL JOINT FUSION Left 09/16/2021   Procedure: left small finger distal interphalangeal joint fusion;  Surgeon: [name], MD;  Location: [facility];  Service: Orthopedics;  Laterality: Left;  with MAC anesthesia   ESOPHAGOGASTRODUODENOSCOPY  09/22/2020   FOOT SURGERY Bilateral    eary 2000s;  bilateral bunionectomy  and removal bone right 5th toe   MASTOIDECTOMY Left 1974   RADIOFREQUENCY ABLATION  09/01/2021   lumbar , left side   ROTATOR CUFF REPAIR Right 2014   SALPINGOOPHORECTOMY Bilateral 1995   w/ excision endometriosis via laparotomy   TONSILLECTOMY AND ADENOIDECTOMY  1970   TOTAL ABDOMINAL HYSTERECTOMY  1987   TRIGGER FINGER RELEASE Bilateral    early 2000s; one finger each hand   WRIST GANGLION EXCISION Left    2000s    Family History  Problem Relation Age of Onset   Heart failure Mother    Cancer Father    Cancer Brother     Social History   Socioeconomic History   Marital status: Married    Spouse name: Randal   Number of children: 2   Years of education: 14   Highest education level: 12th grade  Occupational History   Occupation: retired    Comment: Retail buyer  Tobacco Use   Smoking status: Former    Years: 30.00    Types: Cigarettes    Quit date: 2010    Years since quitting: 13.1   Smokeless tobacco: Never  Vaping Use   Vaping Use: Never used  Substance and Sexual Activity   Alcohol use: Not Currently    Comment: Rarely   Drug use: Never   Sexual activity: Not on file  Other Topics Concern   Not on file  Social History Narrative   Walks 3 times a week   Social Determinants of Health   Financial Resource Strain: Not on file  Food Insecurity: Not on file  Transportation Needs: Not on file  Physical Activity: Not on file   Stress: Not on file  Social Connections: Not on file  Intimate Partner Violence: Not on file    Outpatient Medications Prior to Visit  Medication Sig Dispense Refill   amoxicillin-clavulanate (AUGMENTIN) 875-125 MG tablet Take 1 tablet by mouth 2 (two) times daily. 14 tablet 0   buPROPion (WELLBUTRIN XL) 300 MG 24 hr tablet Take 1 tablet (300 mg total) by mouth every morning. (Patient taking differently: Take 300 mg by mouth every morning.) 90 tablet 3   Calcium-Magnesium-Vitamin D (CITRACAL CALCIUM+D) 600-40-500 MG-MG-UNIT TB24 Take 2 tablets by mouth daily.     cholecalciferol (VITAMIN D3) 25 MCG (1000 UNIT) tablet Take 1,000 Units by mouth daily.     cyclobenzaprine (FLEXERIL) 10 MG tablet TAKE 1/2 TO 1 TABLET BY MOUTH 3 TIMES A DAY AS NEEDED MUSCLE SPASMS (Patient taking differently: Take 5-10 mg by mouth 3 (three) times daily as needed. TAKE 1/2 TO 1 TABLET BY MOUTH 3 TIMES A DAY AS NEEDED MUSCLE SPASMS) 270 tablet 3   diphenhydramine-acetaminophen (TYLENOL PM) 25-500 MG TABS tablet Take 2 tablets by mouth at bedtime.     famotidine (PEPCID) 40 MG tablet TAKE 1 TABLET EVERY DAY (APPOINTMENT IS NEEDED FOR REFILLS) 90 tablet 3   gabapentin (NEURONTIN) 300 MG capsule Take 1 capsule (300 mg total) by mouth at bedtime. 90 capsule 3   ipratropium (ATROVENT) 0.03 % nasal spray Place 2 sprays into both nostrils as needed for rhinitis.     lidocaine (XYLOCAINE) 2 % solution Use as directed 10-15 mLs in the mouth or throat every 3 (three) hours as needed (mouth/throat pain). 100 mL 1   loratadine (CLARITIN) 10 MG tablet Take 10 mg by mouth daily as needed.     LUTEIN ESTERS PO Take 1 tablet by mouth at bedtime.     meloxicam (MOBIC) 15 MG tablet Take 1 tablet (15 mg total) by mouth daily. TAKE 1 TABLET BY MOUTH EVERY DAY IN THE MORNING WITH FOOD 90 tablet 3   triamcinolone (KENALOG) 0.1 % paste Use as directed 1 application in the mouth or throat 2 (two) times daily. 5 g 11    valsartan-hydrochlorothiazide (DIOVAN-HCT) 80-12.5 MG tablet Take 1 tablet by mouth at bedtime. 90 tablet 0   No facility-administered medications prior to visit.    Allergies  Allergen Reactions   Codeine Other (See Comments)    Hyperactivity "wired" super hyperactivity      Colestipol Swelling    Lips swollen/NO resp issues   Kiwi Extract Hives   Other Hives, Rash and Other (See Comments)    Nectarines--tongue swells    Plum Pulp Hives   Tape Hives and Rash   Tomato Hives   Prednisone  Other (See Comments)    Irritability      ROS Review of Systems    Objective:    Physical Exam  BP (!) 167/70    Pulse 78    SpO2 100%  Wt Readings from Last 3 Encounters:  01/20/22 130 lb (59 kg)  12/31/21 130 lb (59 kg)  11/15/21 131 lb (59.4 kg)     There are no preventive care reminders to display for this patient.  There are no preventive care reminders to display for this patient.  Lab Results  Component Value Date   TSH 0.71 08/17/2017   Lab Results  Component Value Date   WBC 9.0 06/09/2021   HGB 13.3 09/16/2021   HCT 39.0 09/16/2021   MCV 96.1 06/09/2021   PLT 309 06/09/2021   Lab Results  Component Value Date   NA 144 01/20/2022   K 3.6 01/20/2022   CO2 32 01/20/2022   GLUCOSE 82 01/20/2022   BUN 14 01/20/2022   CREATININE 1.08 (H) 01/20/2022   BILITOT 0.3 01/20/2022   AST 20 01/20/2022   ALT 18 01/20/2022   PROT 6.9 01/20/2022   CALCIUM 10.2 01/20/2022   EGFR 55 (L) 01/20/2022   Lab Results  Component Value Date   CHOL 198 06/09/2021   Lab Results  Component Value Date   HDL 81 06/09/2021   Lab Results  Component Value Date   LDLCALC 96 06/09/2021   Lab Results  Component Value Date   TRIG 113 06/09/2021   Lab Results  Component Value Date   CHOLHDL 2.4 06/09/2021   No results found for: HGBA1C    Assessment & Plan:  Prolia injection - Patient tolerated injection well without complications. Patient advised to schedule next  injection 6 months from today.     Hypertension - Per Joy, she will increase the valsartan part of the valsartan-HCTZ. This will be sent to [facility]. Patient advised of the increase in medication and to follow up in 2 weeks for a blood pressure nurse visit.   Problem List Items Addressed This Visit     Essential hypertension   Osteoporosis - Primary    Meds ordered this encounter  Medications   denosumab (PROLIA) injection 60 mg    Order Specific Question:   Patient is enrolled in [facility] program for this medication and I have provided a copy of the Prolia Medication Guide and Patient Brochure.    Answer:   No    Order Specific Question:   I have reviewed with the patient the information in the Prolia Medication Guide and Patient Counseling Chart including the serious risks of Prolia and symptoms of each risk.    Answer:   Yes    Order Specific Question:   I have advised the patient to seek medical attention if they have signs or symptoms of any of the serious risks.    Answer:   Yes    Follow-up: Return in about 2 weeks (around 02/11/2022) for nurse visit blood pressure check. Durene Romans, Monico Blitz, [place]

## 2022-01-31 ENCOUNTER — Ambulatory Visit (INDEPENDENT_AMBULATORY_CARE_PROVIDER_SITE_OTHER): Payer: Medicare HMO | Admitting: Medical-Surgical

## 2022-01-31 DIAGNOSIS — Z0000 Encounter for general adult medical examination without abnormal findings: Secondary | ICD-10-CM

## 2022-01-31 NOTE — Progress Notes (Signed)
MEDICARE ANNUAL WELLNESS VISIT  01/31/2022  Telephone Visit Disclaimer This Medicare AWV was conducted by telephone due to national recommendations for restrictions regarding the COVID-19 Pandemic (e.g. social distancing).  I verified, using two identifiers, that I am speaking with Krista [name] or their authorized healthcare agent. I discussed the limitations, risks, security, and privacy concerns of performing an evaluation and management service by telephone and the potential availability of an in-person appointment in the future. The patient expressed understanding and agreed to proceed.  Location of Patient: Home Location of Provider (nurse):  In the office.  Subjective:    Krista [name] is a 71 y.o. female patient of [name], NP who had a Medicare Annual Wellness Visit today via telephone. Bonnie is Retired and lives with their spouse. she has 2 children. she reports that she is socially active and does interact with friends/family regularly. she is moderately physically active and enjoys swimming, reading, playing with her grandson and walking..  Patient Care Team: [name], NP as PCP - General (Nurse Practitioner) Darius Bump, [facility] as Pharmacist (Pharmacist)  Advanced Directives 01/31/2022 09/16/2021 09/11/2020 07/09/2020 09/03/2019 08/21/2018 07/06/2018  Does Patient Have a Medical Advance Directive? _0  Yes Yes  Type of Advance Directive Living will;Healthcare Power of [place];Living will;Out of facility DNR (pink MOST or yellow form) [place];Living will [place];Living will [place];Living will;Out of facility DNR (pink MOST or yellow form) [place];Living will  Does patient want to make changes to medical advance directive? No - Patient declined No - Patient declined Yes (MAU/Ambulatory/Procedural Areas - Information given) - No - Patient declined No  - Patient declined No - Patient declined  Copy of [place] in Chart? No - copy requested - No - copy requested No - copy requested No - copy requested No - copy requested No - copy requested    Hospital Utilization Over the Past 12 Months: # of hospitalizations or ER visits: 0 # of surgeries: 2  Review of Systems    Patient reports that her overall health is worse compared to last year.  History obtained from chart review and the patient  Patient Reported Readings (BP, Pulse, CBG, Weight, etc) none  Pain Assessment Pain : No/denies pain Pain Score: 0-No pain     Current Medications & Allergies (verified) Allergies as of 01/31/2022       Reactions   Codeine Other (See Comments)   Hyperactivity "wired" super hyperactivity    Colestipol Swelling   Lips swollen/NO resp issues   Kiwi Extract Hives   Other Hives, Rash, Other (See Comments)   Nectarines--tongue swells   Plum Pulp Hives   Tape Hives, Rash   Tomato Hives   Prednisone Other (See Comments)   Irritability         Medication List        Accurate as of January 31, 2022  9:22 AM. If you have any questions, ask your nurse or doctor.          amoxicillin-clavulanate 875-125 MG tablet Commonly known as: AUGMENTIN Take 1 tablet by mouth 2 (two) times daily.   buPROPion 300 MG 24 hr tablet Commonly known as: Wellbutrin XL Take 1 tablet (300 mg total) by mouth every morning.   cholecalciferol 25 MCG (1000 UNIT) tablet Commonly known as: VITAMIN D3 Take 1,000 Units by mouth daily.   [place] CALCIUM+D [id number] MG-MG-UNIT  Tb24 Generic drug: Calcium-Magnesium-Vitamin D Take 2 tablets by mouth daily.   colchicine 0.6 MG tablet Take 0.6 mg by mouth 2 (two) times daily.   cyclobenzaprine 10 MG tablet Commonly known as: FLEXERIL TAKE 1/2 TO 1 TABLET BY MOUTH 3 TIMES A DAY AS NEEDED MUSCLE SPASMS What changed:  how much to take how to take this when to take this reasons to take  this   diphenhydramine-acetaminophen 25-500 MG Tabs tablet Commonly known as: TYLENOL PM Take 2 tablets by mouth at bedtime.   famotidine 40 MG tablet Commonly known as: PEPCID TAKE 1 TABLET EVERY DAY (APPOINTMENT IS NEEDED FOR REFILLS)   gabapentin 300 MG capsule Commonly known as: NEURONTIN Take 1 capsule (300 mg total) by mouth at bedtime.   ipratropium 0.03 % nasal spray Commonly known as: ATROVENT Place 2 sprays into both nostrils as needed for rhinitis.   lidocaine 2 % solution Commonly known as: XYLOCAINE Use as directed 10-15 mLs in the mouth or throat every 3 (three) hours as needed (mouth/throat pain).   loratadine 10 MG tablet Commonly known as: CLARITIN Take 10 mg by mouth daily as needed.   LUTEIN ESTERS PO Take 1 tablet by mouth at bedtime.   meloxicam 15 MG tablet Commonly known as: MOBIC Take 1 tablet (15 mg total) by mouth daily. TAKE 1 TABLET BY MOUTH EVERY DAY IN THE MORNING WITH FOOD   triamcinolone 0.1 % paste Commonly known as: KENALOG Use as directed 1 application in the mouth or throat 2 (two) times daily.   valACYclovir 1000 MG tablet Commonly known as: VALTREX Take by mouth.   valsartan-hydrochlorothiazide 160-12.5 MG tablet Commonly known as: DIOVAN-HCT Take 1 tablet by mouth daily.        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    Depression    2021   Facial asymmetry, acquired 1974   left side paralysis   GERD (gastroesophageal reflux disease) 2021   H/O: Bell's palsy 1974   per pt during pregnancy , left side paralysis, told possible bell's palsy or stroke, after testing done had left mastoidectomy;   residual left side facialy asymmetry   History of basal cell carcinoma (BCC) excision    History of cerebral aneurysm 2010   s/p coiling  ;  per pt was an incidental finding on imaging   History of Clostridioides difficile infection 2010   treated   History of COVID-19 05/08/2021   positive result in care everywhere; per pt mild to moderate symptoms that resolved   History of Helicobacter pylori infection 2010   treated   Hyperlipidemia    Hypertension    followed by pcp   Lymphocytic colitis    followed by dr c. Shary Key (GI);  dx by biopsy 09-13-2017   Macular degeneration of both eyes    Non-intractable vomiting with nausea    followed by dr Shary Key   OA (osteoarthritis)    Osteoporosis    PONV (postoperative nausea and vomiting)    Spondylosis, unspecified    cervical and lumbar   Wears hearing aid in both ears    Past Surgical History:  Procedure Laterality Date  CARPAL TUNNEL RELEASE Bilateral    early 2000s   CATARACT EXTRACTION W/ INTRAOCULAR LENS IMPLANT Bilateral 2014   CEREBRAL ANEURYSM REPAIR  2010   endocerebral coiling   COLONOSCOPY  09/13/2017   COSMETIC SURGERY     [facility] Surgery on nose   DISTAL INTERPHALANGEAL JOINT FUSION Left 09/16/2021   Procedure: left small finger distal interphalangeal joint fusion;  Surgeon: [name], MD;  Location: [facility];  Service: Orthopedics;  Laterality: Left;  with MAC anesthesia   ESOPHAGOGASTRODUODENOSCOPY  09/22/2020   EYE SURGERY  2020   Blepharoplasty   FOOT SURGERY Bilateral    eary 2000s;  bilateral bunionectomy  and removal bone right 5th toe   FRACTURE SURGERY  1968   Shattered right ankle   MASTOIDECTOMY Left 1974   RADIOFREQUENCY ABLATION  09/01/2021   lumbar , left side   ROTATOR CUFF REPAIR Right 2014   SALPINGOOPHORECTOMY Bilateral 1995   w/ excision endometriosis via laparotomy   SPINE SURGERY  2021/2022   DDD, osteoarthritis   TONSILLECTOMY AND ADENOIDECTOMY  1970   TOTAL  ABDOMINAL HYSTERECTOMY  1987   TRIGGER FINGER RELEASE Bilateral    early 2000s; one finger each hand   TUBAL LIGATION  1982   WRIST GANGLION EXCISION Left    2000s   Family History  Problem Relation Age of Onset   Heart failure Mother    Arthritis Mother    Hearing loss Mother    Heart disease Mother    Hypertension Mother    Stroke Mother    Varicose Veins Mother    Cancer Father    Alcohol abuse Father    Early death Father    Hearing loss Father    Cancer Brother    Alcohol abuse Brother    ADD / ADHD Son    Alcohol abuse Son    Alcohol abuse Sister    Arthritis Sister    Alcohol abuse Brother    Alcohol abuse Daughter    Arthritis Sister    Cancer Sister    Heart disease Sister    Arthritis Sister    Arthritis Brother    Hearing loss Brother    Social History   Socioeconomic History   Marital status: Married    Spouse name: Randal   Number of children: 2   Years of education: 14   Highest education level: Associate degree: academic program  Occupational History   Occupation: retired    Comment: Retail buyer  Tobacco Use   Smoking status: Former    Packs/day: 0.75    Years: 42.00    Pack years: 31.50    Types: Cigarettes    Quit date: 10/05/2009    Years since quitting: 12.3   Smokeless tobacco: Never  Vaping Use   Vaping Use: Never used  Substance and Sexual Activity   Alcohol use: Not Currently    Alcohol/week: 1.0 standard drink    Types: 1 Glasses of wine per week    Comment: Rarely   Drug use: Never   Sexual activity: Not Currently    Birth control/protection: Abstinence, None  Other Topics Concern   Not on file  Social History Narrative   Lives with her husband. She enjoys swimming, reading, playing with her grandson and walking.   Social Determinants of Health   Financial Resource Strain: Low Risk    Difficulty of Paying Living Expenses: Not hard at all  Food Insecurity: No Food Insecurity   Worried About Running Out  of Food  in the Last Year: Never true   [place] in the Last Year: Never true  Transportation Needs: No Transportation Needs   Lack of Transportation (Medical): No   Lack of Transportation (Non-Medical): No  Physical Activity: Sufficiently Active   Days of Exercise per Week: 7 days   Minutes of Exercise per Session: 30 min  Stress: No Stress Concern Present   Feeling of Stress : Only a little  Social Connections: Engineer, building services of Communication with Friends and Family: More than three times a week   Frequency of Social Gatherings with Friends and Family: Once a week   Attends Religious Services: More than 4 times per year   Active Member of [employer] or Organizations: Yes   Attends Archivist Meetings: More than 4 times per year   Marital Status: Married    Activities of Daily Living In your present state of health, do you have any difficulty performing the following activities: 01/27/2022 09/16/2021  Hearing? N Y  Vision? N N  Difficulty concentrating or making decisions? Y N  Walking or climbing stairs? Y Y  Dressing or bathing? N N  Doing errands, shopping? Y -  Conservation officer, nature and eating ? N -  Using the Toilet? N -  In the past six months, have you accidently leaked urine? N -  Do you have problems with loss of bowel control? N -  Managing your Medications? N -  Managing your Finances? N -  Housekeeping or managing your Housekeeping? Y -  Some recent data might be hidden    Patient Education/ Literacy How often do you need to have someone help you when you read instructions, pamphlets, or other written materials from your doctor or pharmacy?: 1 - Never What is the last grade level you completed in school?: Associates degree  Exercise Current Exercise Habits: Home exercise routine, Type of exercise: walking, Time (Minutes): 30, Frequency (Times/Week): 7, Weekly Exercise (Minutes/Week): 210, Intensity: Moderate, Exercise limited by: orthopedic  condition(s)  Diet Patient reports consuming 3 meals a day and 0 snack(s) a day Patient reports that her primary diet is: Regular Patient reports that she does have regular access to food.   Depression Screen PHQ 2/9 Scores 01/31/2022 12/31/2021 07/27/2021 06/09/2021 09/11/2020 09/03/2019 07/25/2019  PHQ - 2 Score _0 0 0 1  PHQ- 9 Score 12 - 10 - - 2 9     Fall Risk Fall Risk  01/31/2022 01/27/2022 12/31/2021 07/27/2021 06/09/2021  Falls in the past year? _1 Number falls in past yr: 0 0 0 0 0  Injury with Fall? 0 0 0 0 0  Risk for fall due to : No Fall Risks - History of fall(s) Impaired balance/gait History of fall(s)  Follow up Falls evaluation completed - Falls evaluation completed Falls evaluation completed Falls evaluation completed     Objective:  Kateryna Grantham seemed alert and oriented and she participated appropriately during our telephone visit.  Blood Pressure Weight BMI  BP Readings from Last 3 Encounters:  01/28/22 (!) 167/70  01/20/22 (!) 147/85  12/31/21 137/74   Wt Readings from Last 3 Encounters:  01/20/22 130 lb (59 kg)  12/31/21 130 lb (59 kg)  11/15/21 131 lb (59.4 kg)   BMI Readings from Last 1 Encounters:  01/20/22 21.63 kg/m    *Unable to obtain current vital signs, weight, and BMI due to telephone visit type  Hearing/Vision  Oddie did not seem to have difficulty with hearing/understanding during the telephone conversation Reports that she has had a formal eye exam by an eye care professional within the past year Reports that she has not had a formal hearing evaluation within the past year *Unable to fully assess hearing and vision during telephone visit type  Cognitive Function: 6CIT Screen 01/31/2022 09/11/2020 09/03/2019 08/21/2018  What Year? 0 points 0 points 0 points 0 points  What month? 0 points 0 points 0 points 0 points  What time? 0 points 0 points 0 points 0 points  Count back from 20 0 points 0 points 0 points 0 points  Months in  reverse 0 points 0 points 0 points 0 points  Repeat phrase 0 points 0 points 2 points 0 points  Total Score 0 0 2 0   (Normal:0-7, Significant for Dysfunction: >8)  Normal Cognitive Function Screening: Yes   Immunization & Health Maintenance Record Immunization History  Administered Date(s) Administered   Fluad Quad(high Dose 65+) 08/23/2019   Influenza, High Dose Seasonal PF 08/15/2017, 08/24/2018, 09/04/2018   Influenza-Unspecified 08/23/2019, 08/01/2020, 09/08/2021   PFIZER(Purple Top)SARS-COV-2 Vaccination 01/16/2020, 02/11/2020, 08/01/2020, 12/01/2020   Pfizer Covid-19 Vaccine Bivalent Booster 23yr & up 09/08/2021   Pneumococcal Conjugate-13 09/11/2020   Pneumococcal Polysaccharide-23 09/03/2019   Tdap 06/20/2014   Zoster Recombinat (Shingrix) 12/01/2020, 02/02/2021    Health Maintenance  Topic Date Due   DEXA SCAN  10/21/2022   MAMMOGRAM  01/07/2024   TETANUS/TDAP  06/20/2024   COLONOSCOPY (Pts 45-413yrInsurance coverage will need to be confirmed)  09/14/2027   Pneumonia Vaccine 71Years old  Completed   INFLUENZA VACCINE  Completed   COVID-19 Vaccine  Completed   Hepatitis C Screening  Completed   Zoster Vaccines- Shingrix  Completed   HPV VACCINES  Aged Out       Assessment  This is a routine wellness examination for [employer] Health Maintenance: Due or Overdue There are no preventive care reminders to display for this patient.  NaMariane Duvaloes not need a referral for Community Assistance: Care Management:   no Social Work:    no Prescription Assistance:  no Nutrition/Diabetes Education:  no   Plan:  Personalized Goals  Goals Addressed               This Visit's Progress     Patient Stated (pt-stated)        Patient states she would like to be healthy enough to get off of some of her medications.       Personalized Health Maintenance & Screening Recommendations  Bone densitometry screening - due in November, 2023  Lung Cancer  Screening Recommended: no (Low Dose CT Chest recommended if Age 681-80ears, 30 pack-year currently smoking OR have quit w/in past 15 years) Hepatitis C Screening recommended: no HIV Screening recommended: no  Advanced Directives: Written information was not prepared per patient's request.  Referrals & Orders No orders of the defined types were placed in this encounter.   Follow-up Plan Follow-up with JeSamuel BoucheNP as planned Medicare wellness visit in one year. Patient will access AVS on my chart.   I have personally reviewed and noted the following in the patients chart:   Medical and social history Use of alcohol, tobacco or illicit drugs  Current medications and supplements Functional ability and status Nutritional status Physical activity Advanced directives List of other physicians Hospitalizations, surgeries, and ER visits in previous 12 months Vitals Screenings to include  cognitive, depression, and falls Referrals and appointments  In addition, I have reviewed and discussed with Krista [name] certain preventive protocols, quality metrics, and best practice recommendations. A written personalized care plan for preventive services as well as general preventive health recommendations is available and can be mailed to the patient at her request.      Tinnie Gens, RN  01/31/2022

## 2022-01-31 NOTE — Patient Instructions (Addendum)
[place] Maintenance Summary and Written Plan of Care  Krista [name] ,  Thank you for allowing me to perform your Medicare Annual Wellness Visit and for your ongoing commitment to your health.   Health Maintenance & Immunization History Health Maintenance  Topic Date Due   DEXA SCAN  10/21/2022   MAMMOGRAM  01/07/2024   TETANUS/TDAP  06/20/2024   COLONOSCOPY (Pts 45-50yrs Insurance coverage will need to be confirmed)  09/14/2027   Pneumonia Vaccine 26+ Years old  Completed   INFLUENZA VACCINE  Completed   COVID-19 Vaccine  Completed   Hepatitis C Screening  Completed   Zoster Vaccines- Shingrix  Completed   HPV VACCINES  Aged Out   Immunization History  Administered Date(s) Administered   Fluad Quad(high Dose 65+) 08/23/2019   Influenza, High Dose Seasonal PF 08/15/2017, 08/24/2018, 09/04/2018   Influenza-Unspecified 08/23/2019, 08/01/2020, 09/08/2021   PFIZER(Purple Top)SARS-COV-2 Vaccination 01/16/2020, 02/11/2020, 08/01/2020, 12/01/2020   Pfizer Covid-19 Vaccine Bivalent Booster 68yrs & up 09/08/2021   Pneumococcal Conjugate-13 09/11/2020   Pneumococcal Polysaccharide-23 09/03/2019   Tdap 06/20/2014   Zoster Recombinat (Shingrix) 12/01/2020, 02/02/2021    These are the patient goals that we discussed:  Goals Addressed              This Visit's Progress     Patient Stated (pt-stated)        Patient states she would like to be healthy enough to get off of some of her medications.        This is a list of Health Maintenance Items that are overdue or due now: Bone densitometry screening - due in November, 2023  Orders/Referrals Placed Today: No orders of the defined types were placed in this encounter.  (Contact our referral department at [phone] if you have not spoken with someone about your referral appointment within the next 5 days)    Follow-up Plan Follow-up with [name], NP as planned Medicare  wellness visit in one year. Patient will access AVS on my chart.      Health Maintenance, Female Adopting a healthy lifestyle and getting preventive care are important in promoting health and wellness. Ask your health care provider about: The right schedule for you to have regular tests and exams. Things you can do on your own to prevent diseases and keep yourself healthy. What should I know about diet, weight, and exercise? Eat a healthy diet  Eat a diet that includes plenty of vegetables, fruits, low-fat dairy products, and lean protein. Do not eat a lot of foods that are high in solid fats, added sugars, or sodium. Maintain a healthy weight Body mass index (BMI) is used to identify weight problems. It estimates body fat based on height and weight. Your health care provider can help determine your BMI and help you achieve or maintain a healthy weight. Get regular exercise Get regular exercise. This is one of the most important things you can do for your health. Most adults should: Exercise for at least 150 minutes each week. The exercise should increase your heart rate and make you sweat (moderate-intensity exercise). Do strengthening exercises at least twice a week. This is in addition to the moderate-intensity exercise. Spend less time sitting. Even light physical activity can be beneficial. Watch cholesterol and blood lipids Have your blood tested for lipids and cholesterol at 71 years of age, then have this test every 5 years. Have your cholesterol levels checked more often if: Your lipid or cholesterol levels are high.  You are older than 71 years of age. You are at high risk for heart disease. What should I know about cancer screening? Depending on your health history and family history, you may need to have cancer screening at various ages. This may include screening for: Breast cancer. Cervical cancer. Colorectal cancer. Skin cancer. Lung cancer. What should I know about  heart disease, diabetes, and high blood pressure? Blood pressure and heart disease High blood pressure causes heart disease and increases the risk of stroke. This is more likely to develop in people who have high blood pressure readings or are overweight. Have your blood pressure checked: Every 3-5 years if you are 65-31 years of age. Every year if you are 76 years old or older. Diabetes Have regular diabetes screenings. This checks your fasting blood sugar level. Have the screening done: Once every three years after age 44 if you are at a normal weight and have a low risk for diabetes. More often and at a younger age if you are overweight or have a high risk for diabetes. What should I know about preventing infection? Hepatitis B If you have a higher risk for hepatitis B, you should be screened for this virus. Talk with your health care provider to find out if you are at risk for hepatitis B infection. Hepatitis C Testing is recommended for: Everyone born from [birth year] through 1965. Anyone with known risk factors for hepatitis C. Sexually transmitted infections (STIs) Get screened for STIs, including gonorrhea and chlamydia, if: You are sexually active and are younger than 71 years of age. You are older than 71 years of age and your health care provider tells you that you are at risk for this type of infection. Your sexual activity has changed since you were last screened, and you are at increased risk for chlamydia or gonorrhea. Ask your health care provider if you are at risk. Ask your health care provider about whether you are at high risk for HIV. Your health care provider may recommend a prescription medicine to help prevent HIV infection. If you choose to take medicine to prevent HIV, you should first get tested for HIV. You should then be tested every 3 months for as long as you are taking the medicine. Pregnancy If you are about to stop having your period (premenopausal) and you may  become pregnant, seek counseling before you get pregnant. Take 400 to 800 micrograms (mcg) of folic acid every day if you become pregnant. Ask for birth control (contraception) if you want to prevent pregnancy. Osteoporosis and menopause Osteoporosis is a disease in which the bones lose minerals and strength with aging. This can result in bone fractures. If you are 73 years old or older, or if you are at risk for osteoporosis and fractures, ask your health care provider if you should: Be screened for bone loss. Take a calcium or vitamin D supplement to lower your risk of fractures. Be given hormone replacement therapy (HRT) to treat symptoms of menopause. Follow these instructions at home: Alcohol use Do not drink alcohol if: Your health care provider tells you not to drink. You are pregnant, may be pregnant, or are planning to become pregnant. If you drink alcohol: Limit how much you have to: 0-1 drink a day. Know how much alcohol is in your drink. In the [place] one drink equals one 12 oz bottle of beer (355 mL), one 5 oz glass of wine (148 mL), or one 1 oz glass of hard liquor (44  mL). Lifestyle Do not use any products that contain nicotine or tobacco. These products include cigarettes, chewing tobacco, and vaping devices, such as e-cigarettes. If you need help quitting, ask your health care provider. Do not use street drugs. Do not share needles. Ask your health care provider for help if you need support or information about quitting drugs. General instructions Schedule regular health, dental, and eye exams. Stay current with your vaccines. Tell your health care provider if: You often feel depressed. You have ever been abused or do not feel safe at home. Summary Adopting a healthy lifestyle and getting preventive care are important in promoting health and wellness. Follow your health care provider's instructions about healthy diet, exercising, and getting tested or screened for  diseases. Follow your health care provider's instructions on monitoring your cholesterol and blood pressure. This information is not intended to replace advice given to you by your health care provider. Make sure you discuss any questions you have with your health care provider. Document Revised: 04/12/2021 Document Reviewed: 04/12/2021 Elsevier Patient Education  [place].

## 2022-02-09 ENCOUNTER — Ambulatory Visit: Payer: Medicare HMO | Admitting: Medical-Surgical

## 2022-02-11 ENCOUNTER — Other Ambulatory Visit: Payer: Self-pay

## 2022-02-11 ENCOUNTER — Ambulatory Visit (INDEPENDENT_AMBULATORY_CARE_PROVIDER_SITE_OTHER): Payer: Medicare HMO | Admitting: Medical-Surgical

## 2022-02-11 VITALS — BP 128/76 | HR 82 | Temp 98.3°F

## 2022-02-11 DIAGNOSIS — I10 Essential (primary) hypertension: Secondary | ICD-10-CM

## 2022-02-11 NOTE — Progress Notes (Signed)
HPI - Patient is here for her blood pressure check. She has been monitoring her blood pressure at home 2-3 times a day. Her readings have been as high as 150/90s but more consistenly in the 130s/90s. The high blood pressures usually occurs at night. She states she has had some dizzy spells when her BP is elevated, but denies any other cardiovascular symptoms.  ? ?Assessment and Plan: Rechecked her blood pressure manually and it was 128/76. I spoke with Dr. Zigmund Daniel about changing the time when she takes her blood pressure medication from the morning to the evening time. Reminded the patient the importance of staying hydrated while on this medication. She is going to keep a log for the next 2 weeks and follow up with another nurse visit if needed.  ?

## 2022-02-11 NOTE — Progress Notes (Signed)
Agree with documentation as above.  ? ?___________________________________________ ?Maresa Morash L. Dea Bitting, DNP, APRN, FNP-BC ?Primary Care and Sports Medicine ?[facility] ? ?

## 2022-02-16 DIAGNOSIS — G5133 Clonic hemifacial spasm, bilateral: Secondary | ICD-10-CM

## 2022-02-16 DIAGNOSIS — H02401 Unspecified ptosis of right eyelid: Secondary | ICD-10-CM

## 2022-02-16 DIAGNOSIS — G510 Bell's palsy: Secondary | ICD-10-CM | POA: Insufficient documentation

## 2022-02-16 DIAGNOSIS — R471 Dysarthria and anarthria: Secondary | ICD-10-CM | POA: Diagnosis not present

## 2022-02-16 HISTORY — DX: Unspecified ptosis of right eyelid: H02.401

## 2022-02-16 HISTORY — DX: Bell's palsy: G51.0

## 2022-02-16 HISTORY — DX: Clonic hemifacial spasm, bilateral: G51.33

## 2022-02-23 ENCOUNTER — Encounter: Payer: Self-pay | Admitting: Sports Medicine

## 2022-02-28 ENCOUNTER — Encounter: Payer: Self-pay | Admitting: Medical-Surgical

## 2022-02-28 MED ORDER — BUPROPION HCL ER (XL) 150 MG PO TB24: 150.0000 mg | ORAL_TABLET | ORAL | 1 refills | Status: DC

## 2022-03-02 ENCOUNTER — Other Ambulatory Visit: Payer: Self-pay | Admitting: Medical-Surgical

## 2022-03-02 DIAGNOSIS — R112 Nausea with vomiting, unspecified: Secondary | ICD-10-CM | POA: Diagnosis not present

## 2022-03-02 DIAGNOSIS — K52832 Lymphocytic colitis: Secondary | ICD-10-CM | POA: Diagnosis not present

## 2022-03-02 DIAGNOSIS — R634 Abnormal weight loss: Secondary | ICD-10-CM | POA: Diagnosis not present

## 2022-03-02 DIAGNOSIS — R1013 Epigastric pain: Secondary | ICD-10-CM | POA: Diagnosis not present

## 2022-03-02 DIAGNOSIS — K219 Gastro-esophageal reflux disease without esophagitis: Secondary | ICD-10-CM | POA: Diagnosis not present

## 2022-03-02 DIAGNOSIS — R159 Full incontinence of feces: Secondary | ICD-10-CM | POA: Diagnosis not present

## 2022-03-02 DIAGNOSIS — R1011 Right upper quadrant pain: Secondary | ICD-10-CM | POA: Diagnosis not present

## 2022-03-03 ENCOUNTER — Other Ambulatory Visit: Payer: Self-pay | Admitting: Osteopathic Medicine

## 2022-03-08 DIAGNOSIS — R634 Abnormal weight loss: Secondary | ICD-10-CM | POA: Diagnosis not present

## 2022-03-08 DIAGNOSIS — I700 Atherosclerosis of aorta: Secondary | ICD-10-CM | POA: Diagnosis not present

## 2022-03-08 DIAGNOSIS — K5900 Constipation, unspecified: Secondary | ICD-10-CM | POA: Diagnosis not present

## 2022-03-08 DIAGNOSIS — R109 Unspecified abdominal pain: Secondary | ICD-10-CM | POA: Diagnosis not present

## 2022-03-08 DIAGNOSIS — R1013 Epigastric pain: Secondary | ICD-10-CM | POA: Diagnosis not present

## 2022-03-08 DIAGNOSIS — R1011 Right upper quadrant pain: Secondary | ICD-10-CM | POA: Diagnosis not present

## 2022-03-08 DIAGNOSIS — Z90710 Acquired absence of both cervix and uterus: Secondary | ICD-10-CM | POA: Diagnosis not present

## 2022-03-08 DIAGNOSIS — K3189 Other diseases of stomach and duodenum: Secondary | ICD-10-CM | POA: Diagnosis not present

## 2022-03-08 DIAGNOSIS — R112 Nausea with vomiting, unspecified: Secondary | ICD-10-CM | POA: Diagnosis not present

## 2022-03-10 DIAGNOSIS — G510 Bell's palsy: Secondary | ICD-10-CM | POA: Diagnosis not present

## 2022-03-10 DIAGNOSIS — R471 Dysarthria and anarthria: Secondary | ICD-10-CM | POA: Diagnosis not present

## 2022-03-28 DIAGNOSIS — G510 Bell's palsy: Secondary | ICD-10-CM | POA: Diagnosis not present

## 2022-03-28 DIAGNOSIS — G245 Blepharospasm: Secondary | ICD-10-CM | POA: Diagnosis not present

## 2022-03-28 DIAGNOSIS — G5133 Clonic hemifacial spasm, bilateral: Secondary | ICD-10-CM | POA: Diagnosis not present

## 2022-03-28 DIAGNOSIS — H02401 Unspecified ptosis of right eyelid: Secondary | ICD-10-CM | POA: Diagnosis not present

## 2022-03-28 DIAGNOSIS — M6289 Other specified disorders of muscle: Secondary | ICD-10-CM | POA: Diagnosis not present

## 2022-03-28 DIAGNOSIS — Z87891 Personal history of nicotine dependence: Secondary | ICD-10-CM | POA: Diagnosis not present

## 2022-03-28 DIAGNOSIS — Z885 Allergy status to narcotic agent status: Secondary | ICD-10-CM | POA: Diagnosis not present

## 2022-03-28 HISTORY — PX: FACIAL RECONSTRUCTION SURGERY: SHX631

## 2022-03-29 DIAGNOSIS — G510 Bell's palsy: Secondary | ICD-10-CM | POA: Diagnosis not present

## 2022-03-29 DIAGNOSIS — Z885 Allergy status to narcotic agent status: Secondary | ICD-10-CM | POA: Diagnosis not present

## 2022-03-29 DIAGNOSIS — G245 Blepharospasm: Secondary | ICD-10-CM | POA: Diagnosis not present

## 2022-03-29 DIAGNOSIS — Z87891 Personal history of nicotine dependence: Secondary | ICD-10-CM | POA: Diagnosis not present

## 2022-03-29 DIAGNOSIS — G5133 Clonic hemifacial spasm, bilateral: Secondary | ICD-10-CM | POA: Diagnosis not present

## 2022-03-29 DIAGNOSIS — M6289 Other specified disorders of muscle: Secondary | ICD-10-CM | POA: Diagnosis not present

## 2022-05-11 DIAGNOSIS — R471 Dysarthria and anarthria: Secondary | ICD-10-CM | POA: Diagnosis not present

## 2022-05-11 DIAGNOSIS — G5133 Clonic hemifacial spasm, bilateral: Secondary | ICD-10-CM | POA: Diagnosis not present

## 2022-05-11 DIAGNOSIS — G510 Bell's palsy: Secondary | ICD-10-CM | POA: Diagnosis not present

## 2022-06-02 DIAGNOSIS — R1013 Epigastric pain: Secondary | ICD-10-CM | POA: Diagnosis not present

## 2022-06-02 DIAGNOSIS — K52832 Lymphocytic colitis: Secondary | ICD-10-CM | POA: Diagnosis not present

## 2022-06-02 DIAGNOSIS — K219 Gastro-esophageal reflux disease without esophagitis: Secondary | ICD-10-CM | POA: Diagnosis not present

## 2022-06-02 DIAGNOSIS — R112 Nausea with vomiting, unspecified: Secondary | ICD-10-CM | POA: Diagnosis not present

## 2022-06-02 DIAGNOSIS — R109 Unspecified abdominal pain: Secondary | ICD-10-CM | POA: Diagnosis not present

## 2022-06-02 DIAGNOSIS — R159 Full incontinence of feces: Secondary | ICD-10-CM | POA: Diagnosis not present

## 2022-06-02 DIAGNOSIS — R634 Abnormal weight loss: Secondary | ICD-10-CM | POA: Diagnosis not present

## 2022-07-04 DIAGNOSIS — M5116 Intervertebral disc disorders with radiculopathy, lumbar region: Secondary | ICD-10-CM | POA: Diagnosis not present

## 2022-07-04 DIAGNOSIS — M1612 Unilateral primary osteoarthritis, left hip: Secondary | ICD-10-CM | POA: Diagnosis not present

## 2022-07-12 ENCOUNTER — Ambulatory Visit: Payer: Medicare HMO | Admitting: Orthopaedic Surgery

## 2022-07-12 ENCOUNTER — Ambulatory Visit (INDEPENDENT_AMBULATORY_CARE_PROVIDER_SITE_OTHER): Payer: Medicare HMO

## 2022-07-12 DIAGNOSIS — M25552 Pain in left hip: Secondary | ICD-10-CM | POA: Diagnosis not present

## 2022-07-12 NOTE — Progress Notes (Signed)
Office Visit Note   Patient: Krista [name]           Date of Birth: 09-24-1951           MRN: [id number] Visit Date: 07/12/2022              Requested by: [name], NP [address] [place],  [place] [address] PCP: [name], NP   Assessment & Plan: Visit Diagnoses:  1. Pain in left hip     Plan: Impression is left hip osteoarthritis exacerbation.  Treatment options were reviewed and we will refer to Dr. Rolena Infante for ultrasound-guided left hip injection.  Patient will follow-up if symptoms return.  Follow-Up Instructions: No follow-ups on file.   Orders:  Orders Placed This Encounter  Procedures   XR HIP UNILAT W OR W/O PELVIS 1V LEFT   No orders of the defined types were placed in this encounter.     Procedures: No procedures performed   Clinical Data: No additional findings.   Subjective: Chief Complaint  Patient presents with   Left Hip - Pain    HPI Krista [name] is a very pleasant 71 year old female here with her husband.  Referral from Dr. Kathyrn Sheriff for right hip and groin pain for about 5 months.  She has used Voltaren gel and oils and Advil on her own.  Advil provides temporary relief.  Hip pain interferes with daily activities and quality of life.  Review of Systems  Constitutional: Negative.   HENT: Negative.    Eyes: Negative.   Respiratory: Negative.    Cardiovascular: Negative.   Endocrine: Negative.   Musculoskeletal: Negative.   Neurological: Negative.   Hematological: Negative.   Psychiatric/Behavioral: Negative.    All other systems reviewed and are negative.    Objective: Vital Signs: There were no vitals taken for this visit.  Physical Exam Vitals and nursing note reviewed.  Constitutional:      Appearance: She is well-developed.  HENT:     Head: Atraumatic.     Nose: Nose normal.  Eyes:     Extraocular Movements: Extraocular movements intact.  Cardiovascular:     Pulses: Normal pulses.  Pulmonary:     Effort:  Pulmonary effort is normal.  Abdominal:     Palpations: Abdomen is soft.  Musculoskeletal:     Cervical back: Neck supple.  Skin:    General: Skin is warm.     Capillary Refill: Capillary refill takes less than 2 seconds.  Neurological:     Mental Status: She is alert. Mental status is at baseline.  Psychiatric:        Behavior: Behavior normal.        Thought Content: Thought content normal.        Judgment: Judgment normal.     Ortho Exam Examination of left hip shows pain with external rotation of logroll.  Pain with Stinchfield sign and FADIR.  Lateral hip is nontender.  No sciatic tension signs. Specialty Comments:  No specialty comments available.  Imaging: XR HIP UNILAT W OR W/O PELVIS 1V LEFT  Result Date: 07/12/2022 Mild osteoarthritis of the left hip with preserved joint space.    PMFS History: Patient Active Problem List   Diagnosis Date Noted   Osteoporosis 11/05/2020   DDD (degenerative disc disease), cervical 07/06/2020   Facet arthritis of lumbar region 05/06/2020   Bilateral wrist pain 05/06/2020   Inflammatory arthritis 03/13/2020   Osteoarthritis of left little finger 07/31/2018   Acute bronchitis with  COPD ([place]) 05/10/2018   Impacted cerumen of left ear 03/07/2018   History of left mastoidectomy 03/07/2018   Tympanosclerosis, left ear 03/07/2018   Cardiac risk counseling 09/05/2017   Essential hypertension 09/05/2017   Mixed hyperlipidemia 09/05/2017   History of Bell's palsy 08/15/2017   History of osteoporosis 08/15/2017   History of colon polyps 08/15/2017   History of nonmelanoma skin cancer 08/15/2017   Brain aneurysm 08/15/2017   Bilateral hearing loss 08/15/2017   H/O hysterectomy for benign disease 08/15/2017   Past Medical History:  Diagnosis Date   Allergy 1974   Anxiety 2021   COPD (chronic obstructive pulmonary disease) ([place])    per pt no issues, chest CT in epic 07-29-2021   DDD (degenerative disc disease), cervical    DDD  (degenerative disc disease), lumbar    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    Depression    2021   Facial asymmetry, acquired 1974   left side paralysis   GERD (gastroesophageal reflux disease) 2021   H/O: Bell's palsy 1974   per pt during pregnancy , left side paralysis, told possible bell's palsy or stroke, after testing done had left mastoidectomy;  residual left side facialy asymmetry   History of basal cell carcinoma (BCC) excision    History of cerebral aneurysm 2010   s/p coiling  ;  per pt was an incidental finding on imaging   History of Clostridioides difficile infection 2010   treated   History of COVID-19 05/08/2021   positive result in care everywhere; per pt mild to moderate symptoms that resolved   History of Helicobacter pylori infection 2010   treated   Hyperlipidemia    Hypertension    followed by pcp   Lymphocytic colitis    followed by dr c. Shary Key (GI);  dx by biopsy 09-13-2017   Macular degeneration of both eyes    Non-intractable vomiting with nausea    followed by dr Shary Key   OA (osteoarthritis)    Osteoporosis    PONV (postoperative nausea and vomiting)    Spondylosis, unspecified    cervical and lumbar   Wears hearing aid in both ears     Family History  Problem Relation Age of Onset   Heart failure Mother    Arthritis Mother    Hearing loss Mother    Heart disease Mother    Hypertension Mother    Stroke Mother    Varicose Veins Mother    Cancer Father    Alcohol abuse Father    Early death Father    Hearing loss Father    Cancer Brother    Alcohol abuse Brother    ADD / ADHD Son    Alcohol abuse Son    Alcohol abuse Sister    Arthritis Sister    Alcohol abuse Brother    Alcohol abuse Daughter    Arthritis Sister    Cancer Sister    Heart disease Sister    Arthritis Sister    Arthritis Brother    Hearing loss Brother     Past Surgical History:  Procedure Laterality Date   CARPAL  TUNNEL RELEASE Bilateral    early 2000s   CATARACT EXTRACTION W/ INTRAOCULAR LENS IMPLANT Bilateral 2014   CEREBRAL ANEURYSM REPAIR  2010   endocerebral coiling   COLONOSCOPY  09/13/2017   COSMETIC SURGERY     [facility] Surgery on nose   DISTAL INTERPHALANGEAL JOINT FUSION Left 09/16/2021   Procedure:  left small finger distal interphalangeal joint fusion;  Surgeon: [name], MD;  Location[facility];  Service: Orthopedics;  Laterality: Left;  with MAC anesthesia   ESOPHAGOGASTRODUODENOSCOPY  09/22/2020   EYE SURGERY  2020   Blepharoplasty   FOOT SURGERY Bilateral    eary 2000s;  bilateral bunionectomy  and removal bone right 5th toe   FRACTURE SURGERY  1968   Shattered right ankle   MASTOIDECTOMY Left 1974   RADIOFREQUENCY ABLATION  09/01/2021   lumbar , left side   ROTATOR CUFF REPAIR Right 2014   SALPINGOOPHORECTOMY Bilateral 1995   w/ excision endometriosis via laparotomy   SPINE SURGERY  2021/2022   DDD, osteoarthritis   TONSILLECTOMY AND ADENOIDECTOMY  1970   TOTAL ABDOMINAL HYSTERECTOMY  1987   TRIGGER FINGER RELEASE Bilateral    early 2000s; one finger each hand   TUBAL LIGATION  1982   WRIST GANGLION EXCISION Left    2000s   Social History   Occupational History   Occupation: retired    Comment: Retail buyer  Tobacco Use   Smoking status: Former    Packs/day: 0.75    Years: 42.00    Total pack years: 31.50    Types: Cigarettes    Quit date: 10/05/2009    Years since quitting: 12.7   Smokeless tobacco: Never  Vaping Use   Vaping Use: Never used  Substance and Sexual Activity   Alcohol use: Not Currently    Alcohol/week: 1.0 standard drink of alcohol    Types: 1 Glasses of wine per week    Comment: Rarely   Drug use: Never   Sexual activity: Not Currently    Birth control/protection: Abstinence, None

## 2022-07-14 ENCOUNTER — Other Ambulatory Visit: Payer: Self-pay | Admitting: Neurosurgery

## 2022-07-14 DIAGNOSIS — M5116 Intervertebral disc disorders with radiculopathy, lumbar region: Secondary | ICD-10-CM

## 2022-07-16 ENCOUNTER — Ambulatory Visit (INDEPENDENT_AMBULATORY_CARE_PROVIDER_SITE_OTHER): Payer: Medicare HMO

## 2022-07-16 DIAGNOSIS — M48061 Spinal stenosis, lumbar region without neurogenic claudication: Secondary | ICD-10-CM | POA: Diagnosis not present

## 2022-07-16 DIAGNOSIS — M5116 Intervertebral disc disorders with radiculopathy, lumbar region: Secondary | ICD-10-CM

## 2022-07-16 DIAGNOSIS — M5126 Other intervertebral disc displacement, lumbar region: Secondary | ICD-10-CM | POA: Diagnosis not present

## 2022-07-16 DIAGNOSIS — M545 Low back pain, unspecified: Secondary | ICD-10-CM | POA: Diagnosis not present

## 2022-07-22 ENCOUNTER — Ambulatory Visit: Payer: Medicare HMO | Admitting: Sports Medicine

## 2022-07-22 ENCOUNTER — Encounter: Payer: Self-pay | Admitting: Sports Medicine

## 2022-07-22 ENCOUNTER — Ambulatory Visit: Payer: Self-pay

## 2022-07-22 DIAGNOSIS — M1612 Unilateral primary osteoarthritis, left hip: Secondary | ICD-10-CM | POA: Diagnosis not present

## 2022-07-22 DIAGNOSIS — M25552 Pain in left hip: Secondary | ICD-10-CM

## 2022-07-22 NOTE — Progress Notes (Signed)
   Procedure Note  Patient: Krista [name]             Date of Birth: 11-23-1951           MRN: [id number]             Visit Date: 07/22/2022  Subjective: [name] is a pleasant 71 year old female who presents today for left hip injection sent by Dr. Erlinda Hong.  She is excited about the possible relief from the injection.  She has tolerated corticosteroid injections in the past without adverse effects.  Procedures: Visit Diagnoses:  1. Pain in left hip   2. Unilateral primary osteoarthritis, left hip    Procedure: Intra-articular hip injection, left After discussion on risks/benefits/indications and informed verbal consent was obtained, a timeout was performed. Patient was lying supine on exam table. The hip was cleaned with betadine and alcohol swabs. Then utilizing ultrasound guidance, the patient's femoral head and neck junction was identified and subsequently the hip joint was injected with 4:2 lidocaine: depomedrol via an in-plane approach with ultrasound visualization of the injectate administered into the hip joint. Patient tolerated procedure well without immediate complications.  Plan: - routine post-injection care as discussed with patient and seen above  - follow-up with Dr. Erlinda Hong as needed  Elba Barman, [place]   This note was dictated using Dragon naturally speaking software and may contain errors in syntax, spelling, or content which have not been identified prior to signing this note.

## 2022-07-27 ENCOUNTER — Ambulatory Visit: Payer: Medicare HMO

## 2022-08-02 ENCOUNTER — Ambulatory Visit: Payer: Medicare HMO

## 2022-08-02 ENCOUNTER — Ambulatory Visit (INDEPENDENT_AMBULATORY_CARE_PROVIDER_SITE_OTHER): Payer: Medicare HMO

## 2022-08-02 DIAGNOSIS — Z122 Encounter for screening for malignant neoplasm of respiratory organs: Secondary | ICD-10-CM

## 2022-08-02 DIAGNOSIS — Z87891 Personal history of nicotine dependence: Secondary | ICD-10-CM | POA: Diagnosis not present

## 2022-08-03 DIAGNOSIS — M5116 Intervertebral disc disorders with radiculopathy, lumbar region: Secondary | ICD-10-CM | POA: Diagnosis not present

## 2022-08-04 ENCOUNTER — Other Ambulatory Visit: Payer: Self-pay | Admitting: Acute Care

## 2022-08-04 DIAGNOSIS — Z122 Encounter for screening for malignant neoplasm of respiratory organs: Secondary | ICD-10-CM

## 2022-08-04 DIAGNOSIS — Z87891 Personal history of nicotine dependence: Secondary | ICD-10-CM

## 2022-08-11 DIAGNOSIS — G510 Bell's palsy: Secondary | ICD-10-CM | POA: Diagnosis not present

## 2022-08-11 DIAGNOSIS — G5133 Clonic hemifacial spasm, bilateral: Secondary | ICD-10-CM | POA: Diagnosis not present

## 2022-08-22 ENCOUNTER — Ambulatory Visit: Payer: Medicare HMO | Attending: Neurosurgery | Admitting: Physical Therapy

## 2022-08-22 DIAGNOSIS — M5442 Lumbago with sciatica, left side: Secondary | ICD-10-CM | POA: Diagnosis not present

## 2022-08-22 DIAGNOSIS — M545 Low back pain, unspecified: Secondary | ICD-10-CM | POA: Diagnosis not present

## 2022-08-22 DIAGNOSIS — M6281 Muscle weakness (generalized): Secondary | ICD-10-CM | POA: Insufficient documentation

## 2022-08-22 DIAGNOSIS — G8929 Other chronic pain: Secondary | ICD-10-CM | POA: Diagnosis not present

## 2022-08-22 DIAGNOSIS — R29898 Other symptoms and signs involving the musculoskeletal system: Secondary | ICD-10-CM | POA: Diagnosis not present

## 2022-08-22 NOTE — Therapy (Signed)
OUTPATIENT PHYSICAL THERAPY THORACOLUMBAR EVALUATION   Patient Name: Krista [name] MRN: [id number] DOB:03-13-1951, 71 y.o., female Today's Date: 08/22/2022   PT End of Session - 08/22/22 1108     Visit Number 1    Number of Visits 16    Date for PT Re-Evaluation 10/17/22    Authorization Type [employer]    Authorization - Visit Number 0    Authorization - Number of Visits 16    Progress Note Due on Visit 10    PT Start Time 1105    PT Stop Time 1145    PT Time Calculation (min) 40 min    Activity Tolerance Patient tolerated treatment well    Behavior During Therapy WFL for tasks assessed/performed             Past Medical History:  Diagnosis Date   Allergy 1974   Anxiety 2021   COPD (chronic obstructive pulmonary disease) ([place])    per pt no issues, chest CT in epic 07-29-2021   DDD (degenerative disc disease), cervical    DDD (degenerative disc disease), lumbar    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    Depression    2021   Facial asymmetry, acquired 1974   left side paralysis   GERD (gastroesophageal reflux disease) 2021   H/O: Bell's palsy 1974   per pt during pregnancy , left side paralysis, told possible bell's palsy or stroke, after testing done had left mastoidectomy;  residual left side facialy asymmetry   History of basal cell carcinoma (BCC) excision    History of cerebral aneurysm 2010   s/p coiling  ;  per pt was an incidental finding on imaging   History of Clostridioides difficile infection 2010   treated   History of COVID-19 05/08/2021   positive result in care everywhere; per pt mild to moderate symptoms that resolved   History of Helicobacter pylori infection 2010   treated   Hyperlipidemia    Hypertension    followed by pcp   Lymphocytic colitis    followed by dr c. Shary Key (GI);  dx by biopsy 09-13-2017   Macular degeneration of both eyes    Non-intractable vomiting with nausea     followed by dr Shary Key   OA (osteoarthritis)    Osteoporosis    PONV (postoperative nausea and vomiting)    Spondylosis, unspecified    cervical and lumbar   Wears hearing aid in both ears    Past Surgical History:  Procedure Laterality Date   CARPAL TUNNEL RELEASE Bilateral    early 2000s   CATARACT EXTRACTION W/ INTRAOCULAR LENS IMPLANT Bilateral 2014   CEREBRAL ANEURYSM REPAIR  2010   endocerebral coiling   COLONOSCOPY  09/13/2017   COSMETIC SURGERY     [facility] on nose   DISTAL INTERPHALANGEAL JOINT FUSION Left 09/16/2021   Procedure: left small finger distal interphalangeal joint fusion;  Surgeon: [name], MD;  Location: [facility];  Service: Orthopedics;  Laterality: Left;  with MAC anesthesia   ESOPHAGOGASTRODUODENOSCOPY  09/22/2020   EYE SURGERY  2020   Blepharoplasty   FOOT SURGERY Bilateral    eary 2000s;  bilateral bunionectomy  and removal bone right 5th toe   FRACTURE SURGERY  1968   Shattered right ankle   MASTOIDECTOMY Left 1974   RADIOFREQUENCY ABLATION  09/01/2021   lumbar , left side   ROTATOR CUFF REPAIR Right 2014   SALPINGOOPHORECTOMY Bilateral 1995   w/  excision endometriosis via laparotomy   SPINE SURGERY  2021/2022   DDD, osteoarthritis   TONSILLECTOMY AND ADENOIDECTOMY  1970   TOTAL ABDOMINAL HYSTERECTOMY  1987   TRIGGER FINGER RELEASE Bilateral    early 2000s; one finger each hand   TUBAL LIGATION  1982   WRIST GANGLION EXCISION Left    2000s   Patient Active Problem List   Diagnosis Date Noted   Osteoporosis 11/05/2020   DDD (degenerative disc disease), cervical 07/06/2020   Facet arthritis of lumbar region 05/06/2020   Bilateral wrist pain 05/06/2020   Inflammatory arthritis 03/13/2020   Osteoarthritis of left little finger 07/31/2018   Acute bronchitis with COPD ([place]) 05/10/2018   Impacted cerumen of left ear 03/07/2018   History of left mastoidectomy 03/07/2018   Tympanosclerosis, left ear 03/07/2018    Cardiac risk counseling 09/05/2017   Essential hypertension 09/05/2017   Mixed hyperlipidemia 09/05/2017   History of Bell's palsy 08/15/2017   History of osteoporosis 08/15/2017   History of colon polyps 08/15/2017   History of nonmelanoma skin cancer 08/15/2017   Brain aneurysm 08/15/2017   Bilateral hearing loss 08/15/2017   H/O hysterectomy for benign disease 08/15/2017    PCP: [name]  REFERRING PROVIDER: [name]   REFERRING DIAG: M51.16 (ICD-10-CM) - Intervertebral disc disorders with radiculopathy, lumbar region  Rationale for Evaluation and Treatment Rehabilitation  THERAPY DIAG:  Chronic bilateral low back pain with left-sided sciatica  Other symptoms and signs involving the musculoskeletal system  Muscle weakness (generalized)  Acute bilateral low back pain without sciatica  ONSET DATE: 2-3 years  SUBJECTIVE:                                                                                                                                                                                           SUBJECTIVE STATEMENT: Pt reports continued back pain. Has had all the shots, ablations, and blocks and the back pain has continued. Neurosurgeon is recommending back surgery but pt does not want it. Pt states she wants to try strengthening and PT first before considering surgery. Started to have back pain on the right side but not as bad (~3 weeks ago). Left side back pain has been ongoing for 2-3 years. Has tried aquatics. Has not been able to go to the [facility]  PERTINENT HISTORY:  Has had PT in the past; history of multiple lumbar injections, ablations and blocks  PAIN:  Are you having pain? Yes: NPRS scale: 10 at worst; currently 7/10 Pain location: Left low back and comes across front of thigh into leg Pain description: Throbbing pain, as it works to front of the  hip it gets more painful Aggravating factors: Walking, house work Relieving factors:  Heat/ice   PRECAUTIONS: None  WEIGHT BEARING RESTRICTIONS No  FALLS:  Has patient fallen in last 6 months? No  LIVING ENVIRONMENT: Lives with: lives with their spouse Lives in: House/apartment Stairs: Yes: External: 2 steps; on right going up Has following equipment at home: None  OCCUPATION: Retired  PLOF: Independent  PATIENT GOALS Decrease pain   OBJECTIVE:   DIAGNOSTIC FINDINGS:  See pt chart  PATIENT SURVEYS:  FOTO 26; predicted 19  SCREENING FOR RED FLAGS: Bowel or bladder incontinence: No Spinal tumors: No Cauda equina syndrome: No Compression fracture: No Abdominal aneurysm: No  COGNITION:  Overall cognitive status: Within functional limits for tasks assessed     SENSATION: Some NT in front of thigh  MUSCLE LENGTH: Hamstrings: Right ~80 deg; Left ~70 deg  POSTURE:  R trunk lean, L iliac crest higher than R, R weight shift   PALPATION: Hypomobile with L ASIS PA mobs, TTP bilat glutes L>R, L QL, bilat piriformis. L PSIS PA mobs causes R hip spasm  LUMBAR ROM:   Active  A/PROM  eval  Flexion 75% Feels pull  Extension 50% Increases pain  Right lateral flexion To knee feels pain  Left lateral flexion 3 inches above knee feels pull  Right rotation WFL  Left rotation Feels pull on back and L groin but WFL   (Blank rows = not tested)  LOWER EXTREMITY MMT  MMT Right eval Left eval  Hip flexion 3 3  Hip extension 3 3  Hip abduction 3+ 3+  Hip adduction    Hip internal rotation    Hip external rotation    Knee flexion 4 3+  Knee extension 4 4  Ankle dorsiflexion    Ankle plantarflexion    Ankle inversion    Ankle eversion     (Blank rows = not tested)    LUMBAR SPECIAL TESTS:  Straight leg raise test: L LE feels it in groin, Slump test: Positive, Single leg stance test: Positive, and FABER test: Positive on L; anteriorly tilted L inominate  FUNCTIONAL TESTS:  SLS: 5 sec on R, 3 sec on L ([place] noted bilat)  GAIT: Distance  walked: 150' Assistive device utilized: None Level of assistance: Complete Independence Comments: R lateral trunk lean, wider BOS    TODAY'S TREATMENT  08/22/22: See HEP   PATIENT EDUCATION:  Education details: Discussed exam findings, POC, and initial HEP Person educated: Patient Education method: Explanation, Demonstration, and Handouts Education comprehension: verbalized understanding, returned demonstration, and needs further education   HOME EXERCISE PROGRAM: Access Code: [email or web address] URL: [email or web address] Date: 08/22/2022 Prepared by: Estill Bamberg April Thurnell Garbe  Exercises - Supine Butterfly Groin Stretch  - 1 x daily - 7 x weekly - 2 sets - 30 sec hold - Supine Piriformis Stretch with Foot on Ground  - 1 x daily - 7 x weekly - 2 sets - 30 sec hold - Seated Hip Flexor Stretch  - 1 x daily - 7 x weekly - 2 sets - 30 sec hold - Right Standing Lateral Shift Correction at Wall - Repetitions  - 1 x daily - 7 x weekly - 3 sets - 10 sec hold  ASSESSMENT:  CLINICAL IMPRESSION: Patient is a 71 y.o. F who was seen today for physical therapy evaluation and treatment for chronic LBP L>R. Pt with radiating symptoms on L to the front of hip into thigh and down her leg. Assessment significant for L>R  LE weakness, glute tenderness/spasm, pelvic obliquity (L higher than R in standing with R trunk lean), and likely L anterior inominate rotation leading to L femoral nerve impingement leading to radiating symptoms affecting gait and mobility at home. Pt would benefit from PT to address these issues to reduce her risk of need of surgery.    OBJECTIVE IMPAIRMENTS Abnormal gait, decreased activity tolerance, decreased mobility, difficulty walking, decreased ROM, decreased strength, hypomobility, increased fascial restrictions, increased muscle spasms, impaired flexibility, improper body mechanics, postural dysfunction, and pain.   ACTIVITY LIMITATIONS lifting, bending, standing,  sleeping, stairs, transfers, bed mobility, and locomotion level  PARTICIPATION LIMITATIONS: cleaning, laundry, shopping, and community activity  PERSONAL FACTORS Past/current experiences and Time since onset of injury/illness/exacerbation are also affecting patient's functional outcome.   REHAB POTENTIAL: Good  CLINICAL DECISION MAKING: Evolving/moderate complexity  EVALUATION COMPLEXITY: Moderate   GOALS: Goals reviewed with patient? Yes  SHORT TERM GOALS: Target date: 09/19/2022  Pt will be ind with initial HEP Baseline: Goal status: INITIAL  2.  Pt will demo improved L pelvic mobility during spring testing to feel equal with R Baseline:  Goal status: INITIAL  3.  Pt will be able to demo at least 5 sec on L SLS to demo improving LE stability Baseline:  Goal status: INITIAL  4.  Pt will be able to tolerate performing 5x STS without UEs with equal weight shift with pain </=5/10 to demo improving L LE functional strength Baseline: 7/10 pain at baseline Goal status: INITIAL    LONG TERM GOALS: Target date: 10/17/2022  Pt will be ind with maintaining community and home wellness exercise program Baseline:  Goal status: INITIAL  2.  Pt will be able to demo SLS for at least 10 sec to demo improved bilat LE stability in standing Baseline:  Goal status: INITIAL  3.  Pt will demo increased at least 4/5 bilat LE strength Baseline:  Goal status: INITIAL  4.  Pt will demo L=R lateral lumbar flexion AROM for improved trunk posture and symmetry for standing balance Baseline:  Goal status: INITIAL  5.  Pt will have increased FOTO score to 41 Baseline: 26 Goal status: INITIAL     PLAN: PT FREQUENCY: 2x/week  PT DURATION: 8 weeks  PLANNED INTERVENTIONS: Therapeutic exercises, Therapeutic activity, Neuromuscular re-education, Balance training, Gait training, Patient/Family education, Self Care, Joint mobilization, Aquatic Therapy, Dry Needling, Electrical stimulation,  Spinal mobilization, Cryotherapy, Moist heat, Taping, Ionotophoresis 57m/ml Dexamethasone, Manual therapy, and Re-evaluation.  PLAN FOR NEXT SESSION: Assess response to HEP. Manual therapy as indicated. Correct sacrum/pelvis alignment. Initiate strengthening.    Jaylea Plourde April MGordy Levan PT, DPT 08/22/2022, 12:18 PM

## 2022-08-23 DIAGNOSIS — Z20822 Contact with and (suspected) exposure to covid-19: Secondary | ICD-10-CM | POA: Diagnosis not present

## 2022-08-23 DIAGNOSIS — U071 COVID-19: Secondary | ICD-10-CM | POA: Insufficient documentation

## 2022-08-23 DIAGNOSIS — R059 Cough, unspecified: Secondary | ICD-10-CM | POA: Diagnosis not present

## 2022-08-23 DIAGNOSIS — Z6821 Body mass index (BMI) 21.0-21.9, adult: Secondary | ICD-10-CM | POA: Diagnosis not present

## 2022-08-25 ENCOUNTER — Ambulatory Visit: Payer: Medicare HMO | Admitting: Physical Therapy

## 2022-08-30 ENCOUNTER — Encounter: Payer: Medicare HMO | Admitting: Physical Therapy

## 2022-09-01 ENCOUNTER — Encounter: Payer: Self-pay | Admitting: Physical Therapy

## 2022-09-01 ENCOUNTER — Ambulatory Visit: Payer: Medicare HMO | Admitting: Physical Therapy

## 2022-09-01 DIAGNOSIS — G8929 Other chronic pain: Secondary | ICD-10-CM | POA: Diagnosis not present

## 2022-09-01 DIAGNOSIS — M545 Low back pain, unspecified: Secondary | ICD-10-CM | POA: Diagnosis not present

## 2022-09-01 DIAGNOSIS — M6281 Muscle weakness (generalized): Secondary | ICD-10-CM

## 2022-09-01 DIAGNOSIS — R29898 Other symptoms and signs involving the musculoskeletal system: Secondary | ICD-10-CM

## 2022-09-01 DIAGNOSIS — M5442 Lumbago with sciatica, left side: Secondary | ICD-10-CM | POA: Diagnosis not present

## 2022-09-01 NOTE — Therapy (Signed)
OUTPATIENT PHYSICAL THERAPY TREATMENT   Patient Name: Krista [name] MRN: [id number] DOB:1951-05-21, 71 y.o., female Today's Date: 09/01/2022   PT End of Session - 09/01/22 1200     Visit Number 2    Number of Visits 16    Date for PT Re-Evaluation 10/17/22    Authorization Type [employer]    Authorization - Visit Number 1    Authorization - Number of Visits 16    Progress Note Due on Visit 10    PT Start Time 8144    PT Stop Time 8185    PT Time Calculation (min) 40 min    Activity Tolerance Patient tolerated treatment well    Behavior During Therapy WFL for tasks assessed/performed              Past Medical History:  Diagnosis Date   Allergy 1974   Anxiety 2021   COPD (chronic obstructive pulmonary disease) ([place])    per pt no issues, chest CT in epic 07-29-2021   DDD (degenerative disc disease), cervical    DDD (degenerative disc disease), lumbar    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    Depression    2021   Facial asymmetry, acquired 1974   left side paralysis   GERD (gastroesophageal reflux disease) 2021   H/O: Bell's palsy 1974   per pt during pregnancy , left side paralysis, told possible bell's palsy or stroke, after testing done had left mastoidectomy;  residual left side facialy asymmetry   History of basal cell carcinoma (BCC) excision    History of cerebral aneurysm 2010   s/p coiling  ;  per pt was an incidental finding on imaging   History of Clostridioides difficile infection 2010   treated   History of COVID-19 05/08/2021   positive result in care everywhere; per pt mild to moderate symptoms that resolved   History of Helicobacter pylori infection 2010   treated   Hyperlipidemia    Hypertension    followed by pcp   Lymphocytic colitis    followed by dr c. Shary Key (GI);  dx by biopsy 09-13-2017   Macular degeneration of both eyes    Non-intractable vomiting with nausea    followed by dr  Shary Key   OA (osteoarthritis)    Osteoporosis    PONV (postoperative nausea and vomiting)    Spondylosis, unspecified    cervical and lumbar   Wears hearing aid in both ears    Past Surgical History:  Procedure Laterality Date   CARPAL TUNNEL RELEASE Bilateral    early 2000s   CATARACT EXTRACTION W/ INTRAOCULAR LENS IMPLANT Bilateral 2014   CEREBRAL ANEURYSM REPAIR  2010   endocerebral coiling   COLONOSCOPY  09/13/2017   COSMETIC SURGERY     [facility] on nose   DISTAL INTERPHALANGEAL JOINT FUSION Left 09/16/2021   Procedure: left small finger distal interphalangeal joint fusion;  Surgeon: [name], MD;  Location: [place];  Service: Orthopedics;  Laterality: Left;  with MAC anesthesia   ESOPHAGOGASTRODUODENOSCOPY  09/22/2020   EYE SURGERY  2020   Blepharoplasty   FOOT SURGERY Bilateral    eary 2000s;  bilateral bunionectomy  and removal bone right 5th toe   FRACTURE SURGERY  1968   Shattered right ankle   MASTOIDECTOMY Left 1974   RADIOFREQUENCY ABLATION  09/01/2021   lumbar , left side   ROTATOR CUFF REPAIR Right 2014   SALPINGOOPHORECTOMY Bilateral 1995   w/  excision endometriosis via laparotomy   SPINE SURGERY  2021/2022   DDD, osteoarthritis   TONSILLECTOMY AND ADENOIDECTOMY  1970   TOTAL ABDOMINAL HYSTERECTOMY  1987   TRIGGER FINGER RELEASE Bilateral    early 2000s; one finger each hand   TUBAL LIGATION  1982   WRIST GANGLION EXCISION Left    2000s   Patient Active Problem List   Diagnosis Date Noted   Osteoporosis 11/05/2020   DDD (degenerative disc disease), cervical 07/06/2020   Facet arthritis of lumbar region 05/06/2020   Bilateral wrist pain 05/06/2020   Inflammatory arthritis 03/13/2020   Osteoarthritis of left little finger 07/31/2018   Acute bronchitis with COPD ([place]) 05/10/2018   Impacted cerumen of left ear 03/07/2018   History of left mastoidectomy 03/07/2018   Tympanosclerosis, left ear 03/07/2018   Cardiac risk  counseling 09/05/2017   Essential hypertension 09/05/2017   Mixed hyperlipidemia 09/05/2017   History of Bell's palsy 08/15/2017   History of osteoporosis 08/15/2017   History of colon polyps 08/15/2017   History of nonmelanoma skin cancer 08/15/2017   Brain aneurysm 08/15/2017   Bilateral hearing loss 08/15/2017   H/O hysterectomy for benign disease 08/15/2017    PCP: [name]  REFERRING PROVIDER: [name]   REFERRING DIAG: M51.16 (ICD-10-CM) - Intervertebral disc disorders with radiculopathy, lumbar region  Rationale for Evaluation and Treatment Rehabilitation  THERAPY DIAG:  Chronic bilateral low back pain with left-sided sciatica  Other symptoms and signs involving the musculoskeletal system  Muscle weakness (generalized)  Acute bilateral low back pain without sciatica  ONSET DATE: 2-3 years  SUBJECTIVE:                                                                                                                                                                                           SUBJECTIVE STATEMENT: 09/01/22: pt states she had COVID but is testing negative now. Has been having a lot of LOB and fatigue but this has been improving.   From eval: Pt reports continued back pain. Has had all the shots, ablations, and blocks and the back pain has continued. Neurosurgeon is recommending back surgery but pt does not want it. Pt states she wants to try strengthening and PT first before considering surgery. Started to have back pain on the right side but not as bad (~3 weeks ago). Left side back pain has been ongoing for 2-3 years. Has tried aquatics. Has not been able to go to the [facility]  PERTINENT HISTORY:  Has had PT in the past; history of multiple lumbar injections, ablations and blocks  PAIN:  Are you having pain? Yes: NPRS scale:  10 at worst; currently 7/10 Pain location: Left low back and comes across front of thigh into leg Pain description: Throbbing  pain, as it works to front of the hip it gets more painful Aggravating factors: Walking, house work Relieving factors: Heat/ice   PRECAUTIONS: None  WEIGHT BEARING RESTRICTIONS No  FALLS:  Has patient fallen in last 6 months? No  LIVING ENVIRONMENT: Lives with: lives with their spouse Lives in: House/apartment Stairs: Yes: External: 2 steps; on right going up Has following equipment at home: None  OCCUPATION: Retired  PLOF: Independent  PATIENT GOALS Decrease pain   OBJECTIVE:    PATIENT SURVEYS:  FOTO 26; predicted 41  MUSCLE LENGTH: Hamstrings: Right ~80 deg; Left ~70 deg (from eval)  POSTURE:  R trunk lean, L iliac crest higher than R, R weight shift   (from eval)  PALPATION: Hypomobile with L ASIS PA mobs, TTP bilat glutes L>R, L QL, bilat piriformis. L PSIS PA mobs causes R hip spasm (from eval)  LUMBAR ROM:   Active  A/PROM  eval  Flexion 75% Feels pull  Extension 50% Increases pain  Right lateral flexion To knee feels pain  Left lateral flexion 3 inches above knee feels pull  Right rotation WFL  Left rotation Feels pull on back and L groin but WFL   (Blank rows = not tested)  LOWER EXTREMITY MMT  MMT Right eval Left eval  Hip flexion 3 3  Hip extension 3 3  Hip abduction 3+ 3+  Hip adduction    Hip internal rotation    Hip external rotation    Knee flexion 4 3+  Knee extension 4 4  Ankle dorsiflexion    Ankle plantarflexion    Ankle inversion    Ankle eversion     (Blank rows = not tested)    LUMBAR SPECIAL TESTS:  Straight leg raise test: L LE feels it in groin, Slump test: Positive, Single leg stance test: Positive, and FABER test: Positive on L; anteriorly tilted L inominate  FUNCTIONAL TESTS:  SLS: 5 sec on R, 3 sec on L ([place] noted bilat) (from eval)     TODAY'S TREATMENT  09/01/22: Seated Piriformis stretch x30 sec Hamstring stretch 2x 30 sec  Supine Hip flexor stretch 2x30 sec Hip flexion contract/relax to  encourage L posterior inominate rotation 5x5 sec LTR x30 sec L&R Butterfly stretch x30 sec Clamshell green TB 2x10 Bridge with ball squeeze 2x10 Pelvic tilts x10  Standing Lateral hip shifts x10 R & L Mini wall squat 2x10  Manual therapy:  Sidelying L posterior pelvic rotations  08/22/22: See HEP   PATIENT EDUCATION:  Education details: Discussed exam findings, POC, and initial HEP Person educated: Patient Education method: Explanation, Demonstration, and Handouts Education comprehension: verbalized understanding, returned demonstration, and needs further education   HOME EXERCISE PROGRAM: Access Code: [email or web address] URL: [email or web address] Date: 08/22/2022 Prepared by: Estill Bamberg April Thurnell Garbe  Exercises - Supine Butterfly Groin Stretch  - 1 x daily - 7 x weekly - 2 sets - 30 sec hold - Supine Piriformis Stretch with Foot on Ground  - 1 x daily - 7 x weekly - 2 sets - 30 sec hold - Seated Hip Flexor Stretch  - 1 x daily - 7 x weekly - 2 sets - 30 sec hold - Right Standing Lateral Shift Correction at Wall - Repetitions  - 1 x daily - 7 x weekly - 3 sets - 10 sec hold  ASSESSMENT:  CLINICAL IMPRESSION: 09/01/22: Session focused  on reviewing HEP and initiating core/pelvic strengthening and mobility. Pt tolerated well. Worked on decreasing L anterior inominate rotation.   From eval: Patient is a 71 y.o. F who was seen today for physical therapy evaluation and treatment for chronic LBP L>R. Pt with radiating symptoms on L to the front of hip into thigh and down her leg. Assessment significant for L>R LE weakness, glute tenderness/spasm, pelvic obliquity (L higher than R in standing with R trunk lean), and likely L anterior inominate rotation leading to L femoral nerve impingement leading to radiating symptoms affecting gait and mobility at home. Pt would benefit from PT to address these issues to reduce her risk of need of surgery.    OBJECTIVE IMPAIRMENTS Abnormal  gait, decreased activity tolerance, decreased mobility, difficulty walking, decreased ROM, decreased strength, hypomobility, increased fascial restrictions, increased muscle spasms, impaired flexibility, improper body mechanics, postural dysfunction, and pain.   ACTIVITY LIMITATIONS lifting, bending, standing, sleeping, stairs, transfers, bed mobility, and locomotion level  PARTICIPATION LIMITATIONS: cleaning, laundry, shopping, and community activity  PERSONAL FACTORS Past/current experiences and Time since onset of injury/illness/exacerbation are also affecting patient's functional outcome.   REHAB POTENTIAL: Good  CLINICAL DECISION MAKING: Evolving/moderate complexity  EVALUATION COMPLEXITY: Moderate   GOALS: Goals reviewed with patient? Yes  SHORT TERM GOALS: Target date: 09/19/2022  Pt will be ind with initial HEP Baseline: Goal status: INITIAL  2.  Pt will demo improved L pelvic mobility during spring testing to feel equal with R Baseline:  Goal status: INITIAL  3.  Pt will be able to demo at least 5 sec on L SLS to demo improving LE stability Baseline:  Goal status: INITIAL  4.  Pt will be able to tolerate performing 5x STS without UEs with equal weight shift with pain </=5/10 to demo improving L LE functional strength Baseline: 7/10 pain at baseline Goal status: INITIAL    LONG TERM GOALS: Target date: 10/17/2022  Pt will be ind with maintaining community and home wellness exercise program Baseline:  Goal status: INITIAL  2.  Pt will be able to demo SLS for at least 10 sec to demo improved bilat LE stability in standing Baseline:  Goal status: INITIAL  3.  Pt will demo increased at least 4/5 bilat LE strength Baseline:  Goal status: INITIAL  4.  Pt will demo L=R lateral lumbar flexion AROM for improved trunk posture and symmetry for standing balance Baseline:  Goal status: INITIAL  5.  Pt will have increased FOTO score to 41 Baseline: 26 Goal status:  INITIAL     PLAN: PT FREQUENCY: 2x/week  PT DURATION: 8 weeks  PLANNED INTERVENTIONS: Therapeutic exercises, Therapeutic activity, Neuromuscular re-education, Balance training, Gait training, Patient/Family education, Self Care, Joint mobilization, Aquatic Therapy, Dry Needling, Electrical stimulation, Spinal mobilization, Cryotherapy, Moist heat, Taping, Ionotophoresis 41m/ml Dexamethasone, Manual therapy, and Re-evaluation.  PLAN FOR NEXT SESSION: Assess response to HEP. Manual therapy as indicated. Correct sacrum/pelvis alignment. Initiate strengthening.    Nakoa Ganus April Ma L Daizee Firmin, PT, DPT 09/01/2022, 12:01 PM

## 2022-09-05 DIAGNOSIS — R471 Dysarthria and anarthria: Secondary | ICD-10-CM | POA: Diagnosis not present

## 2022-09-05 DIAGNOSIS — G510 Bell's palsy: Secondary | ICD-10-CM | POA: Diagnosis not present

## 2022-09-06 ENCOUNTER — Encounter: Payer: Self-pay | Admitting: Physical Therapy

## 2022-09-06 ENCOUNTER — Telehealth: Payer: Self-pay

## 2022-09-06 ENCOUNTER — Ambulatory Visit: Payer: Medicare HMO | Attending: Neurosurgery | Admitting: Physical Therapy

## 2022-09-06 DIAGNOSIS — G8929 Other chronic pain: Secondary | ICD-10-CM | POA: Insufficient documentation

## 2022-09-06 DIAGNOSIS — M545 Low back pain, unspecified: Secondary | ICD-10-CM | POA: Insufficient documentation

## 2022-09-06 DIAGNOSIS — M5442 Lumbago with sciatica, left side: Secondary | ICD-10-CM | POA: Diagnosis not present

## 2022-09-06 DIAGNOSIS — R29898 Other symptoms and signs involving the musculoskeletal system: Secondary | ICD-10-CM | POA: Insufficient documentation

## 2022-09-06 DIAGNOSIS — M6281 Muscle weakness (generalized): Secondary | ICD-10-CM | POA: Diagnosis not present

## 2022-09-06 NOTE — Telephone Encounter (Signed)
Fax rec'd from [facility] approving prolia for the next calendar year. (12/05/22-12/05/23)  Auth# [id number]

## 2022-09-06 NOTE — Therapy (Signed)
OUTPATIENT PHYSICAL THERAPY TREATMENT   Patient Name: Krista [name] MRN: [id number] DOB:1951-04-02, 71 y.o., female Today's Date: 09/06/2022   PT End of Session - 09/06/22 1058     Visit Number 3    Number of Visits 16    Date for PT Re-Evaluation 10/17/22    Authorization Type [employer]    Authorization - Visit Number 2    Authorization - Number of Visits 16    Progress Note Due on Visit 10    PT Start Time 1100    PT Stop Time 5809    PT Time Calculation (min) 45 min    Activity Tolerance Patient tolerated treatment well    Behavior During Therapy WFL for tasks assessed/performed              Past Medical History:  Diagnosis Date   Allergy 1974   Anxiety 2021   COPD (chronic obstructive pulmonary disease) ([place])    per pt no issues, chest CT in epic 07-29-2021   DDD (degenerative disc disease), cervical    DDD (degenerative disc disease), lumbar    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    Depression    2021   Facial asymmetry, acquired 1974   left side paralysis   GERD (gastroesophageal reflux disease) 2021   H/O: Bell's palsy 1974   per pt during pregnancy , left side paralysis, told possible bell's palsy or stroke, after testing done had left mastoidectomy;  residual left side facialy asymmetry   History of basal cell carcinoma (BCC) excision    History of cerebral aneurysm 2010   s/p coiling  ;  per pt was an incidental finding on imaging   History of Clostridioides difficile infection 2010   treated   History of COVID-19 05/08/2021   positive result in care everywhere; per pt mild to moderate symptoms that resolved   History of Helicobacter pylori infection 2010   treated   Hyperlipidemia    Hypertension    followed by pcp   Lymphocytic colitis    followed by dr c. Shary Key (GI);  dx by biopsy 09-13-2017   Macular degeneration of both eyes    Non-intractable vomiting with nausea    followed by dr  Shary Key   OA (osteoarthritis)    Osteoporosis    PONV (postoperative nausea and vomiting)    Spondylosis, unspecified    cervical and lumbar   Wears hearing aid in both ears    Past Surgical History:  Procedure Laterality Date   CARPAL TUNNEL RELEASE Bilateral    early 2000s   CATARACT EXTRACTION W/ INTRAOCULAR LENS IMPLANT Bilateral 2014   CEREBRAL ANEURYSM REPAIR  2010   endocerebral coiling   COLONOSCOPY  09/13/2017   COSMETIC SURGERY     [facility] Surgery on nose   DISTAL INTERPHALANGEAL JOINT FUSION Left 09/16/2021   Procedure: left small finger distal interphalangeal joint fusion;  Surgeon: [name], MD;  Location: [place];  Service: Orthopedics;  Laterality: Left;  with MAC anesthesia   ESOPHAGOGASTRODUODENOSCOPY  09/22/2020   EYE SURGERY  2020   Blepharoplasty   FOOT SURGERY Bilateral    eary 2000s;  bilateral bunionectomy  and removal bone right 5th toe   FRACTURE SURGERY  1968   Shattered right ankle   MASTOIDECTOMY Left 1974   RADIOFREQUENCY ABLATION  09/01/2021   lumbar , left side   ROTATOR CUFF REPAIR Right 2014   SALPINGOOPHORECTOMY Bilateral 1995   w/  excision endometriosis via laparotomy   SPINE SURGERY  2021/2022   DDD, osteoarthritis   TONSILLECTOMY AND ADENOIDECTOMY  1970   TOTAL ABDOMINAL HYSTERECTOMY  1987   TRIGGER FINGER RELEASE Bilateral    early 2000s; one finger each hand   TUBAL LIGATION  1982   WRIST GANGLION EXCISION Left    2000s   Patient Active Problem List   Diagnosis Date Noted   Osteoporosis 11/05/2020   DDD (degenerative disc disease), cervical 07/06/2020   Facet arthritis of lumbar region 05/06/2020   Bilateral wrist pain 05/06/2020   Inflammatory arthritis 03/13/2020   Osteoarthritis of left little finger 07/31/2018   Acute bronchitis with COPD ([place]) 05/10/2018   Impacted cerumen of left ear 03/07/2018   History of left mastoidectomy 03/07/2018   Tympanosclerosis, left ear 03/07/2018   Cardiac risk  counseling 09/05/2017   Essential hypertension 09/05/2017   Mixed hyperlipidemia 09/05/2017   History of Bell's palsy 08/15/2017   History of osteoporosis 08/15/2017   History of colon polyps 08/15/2017   History of nonmelanoma skin cancer 08/15/2017   Brain aneurysm 08/15/2017   Bilateral hearing loss 08/15/2017   H/O hysterectomy for benign disease 08/15/2017    PCP: [name]  REFERRING PROVIDER: [name]   REFERRING DIAG: M51.16 (ICD-10-CM) - Intervertebral disc disorders with radiculopathy, lumbar region  Rationale for Evaluation and Treatment Rehabilitation  THERAPY DIAG:  Chronic bilateral low back pain with left-sided sciatica  Other symptoms and signs involving the musculoskeletal system  Muscle weakness (generalized)  Acute bilateral low back pain without sciatica  ONSET DATE: 2-3 years  SUBJECTIVE:                                                                                                                                                                                           SUBJECTIVE STATEMENT: 09/06/22: Pt reports she went to [facility] for her Bell's Palsy -- thinks the long car ride exacerbated her back. Has also been walking 20 min. Exercises have been going okay.   From eval: Pt reports continued back pain. Has had all the shots, ablations, and blocks and the back pain has continued. Neurosurgeon is recommending back surgery but pt does not want it. Pt states she wants to try strengthening and PT first before considering surgery. Started to have back pain on the right side but not as bad (~3 weeks ago). Left side back pain has been ongoing for 2-3 years. Has tried aquatics. Has not been able to go to the [facility]  PERTINENT HISTORY:  Has had PT in the past; history of multiple lumbar injections, ablations and blocks  PAIN:  Are  you having pain? Yes: NPRS scale: currently 7/10 Pain location: Left low back and comes across front of thigh into leg Pain  description: Throbbing pain, as it works to front of the hip it gets more painful Aggravating factors: Walking, house work Relieving factors: Heat/ice   PRECAUTIONS: None  WEIGHT BEARING RESTRICTIONS No  FALLS:  Has patient fallen in last 6 months? No  LIVING ENVIRONMENT: Lives with: lives with their spouse Lives in: House/apartment Stairs: Yes: External: 2 steps; on right going up Has following equipment at home: None  OCCUPATION: Retired  PLOF: Independent  PATIENT GOALS Decrease pain   OBJECTIVE:    PATIENT SURVEYS:  FOTO 26; predicted 41  MUSCLE LENGTH: Hamstrings: Right ~80 deg; Left ~70 deg (from eval)  POSTURE:  R trunk lean, L iliac crest higher than R, R weight shift   (from eval)  PALPATION: Hypomobile with L ASIS PA mobs, TTP bilat glutes L>R, L QL, bilat piriformis. L PSIS PA mobs causes R hip spasm (from eval)  LUMBAR ROM:   Active  A/PROM  eval  Flexion 75% Feels pull  Extension 50% Increases pain  Right lateral flexion To knee feels pain  Left lateral flexion 3 inches above knee feels pull  Right rotation WFL  Left rotation Feels pull on back and L groin but WFL   (Blank rows = not tested)  LOWER EXTREMITY MMT  MMT Right eval Left eval  Hip flexion 3 3  Hip extension 3 3  Hip abduction 3+ 3+  Hip adduction    Hip internal rotation    Hip external rotation    Knee flexion 4 3+  Knee extension 4 4  Ankle dorsiflexion    Ankle plantarflexion    Ankle inversion    Ankle eversion     (Blank rows = not tested)    LUMBAR SPECIAL TESTS:  Straight leg raise test: L LE feels it in groin, Slump test: Positive, Single leg stance test: Positive, and FABER test: Positive on L; anteriorly tilted L inominate  FUNCTIONAL TESTS:  SLS: 5 sec on R, 3 sec on L (trendlenburg noted bilat) (from eval)     TODAY'S TREATMENT  09/06/22: THEREX Seated Piriformis stretch x30 sec Hamstring stretch 2x 30 sec  Prone Hip ext 2x10 Hip IR  2x10  Sidelying Oblique/TSA stretch with leg off the table x2 min  Standing Lateral hip shift 2x10 R&L Wall squat 2x10  NMED Amb focusing on hip and trunk control reducing Trendelenburg, lateral hip shift Sitting posture with pool noodle along spine to decrease R trunk lean  MANUAL THERAPY STM & TPR posterior TSA, QL, glutes on L   MODALITIES TENS x15 min to pt tolerance Moist heat x15 min   09/01/22: Seated Piriformis stretch x30 sec Hamstring stretch 2x 30 sec  Supine Hip flexor stretch 2x30 sec Hip flexion contract/relax to encourage L posterior inominate rotation 5x5 sec LTR x30 sec L&R Butterfly stretch x30 sec Clamshell green TB 2x10 Bridge with ball squeeze 2x10 Pelvic tilts x10  Standing Lateral hip shifts x10 R & L Mini wall squat 2x10  Manual therapy:  Sidelying L posterior pelvic rotations  08/22/22: See HEP   PATIENT EDUCATION:  Education details: Discussed exam findings, POC, and initial HEP Person educated: Patient Education method: Explanation, Demonstration, and Handouts Education comprehension: verbalized understanding, returned demonstration, and needs further education   HOME EXERCISE PROGRAM: Access Code: [email or web address] URL: [email or web address] Date: 08/22/2022 Prepared by: Estill Bamberg April Thurnell Garbe  Exercises - Supine Butterfly  Groin Stretch  - 1 x daily - 7 x weekly - 2 sets - 30 sec hold - Supine Piriformis Stretch with Foot on Ground  - 1 x daily - 7 x weekly - 2 sets - 30 sec hold - Seated Hip Flexor Stretch  - 1 x daily - 7 x weekly - 2 sets - 30 sec hold - Right Standing Lateral Shift Correction at Wall - Repetitions  - 1 x daily - 7 x weekly - 3 sets - 10 sec hold  ASSESSMENT:  CLINICAL IMPRESSION: 09/06/22: Pt with improving pelvic symmetry and rotation. Session focused on decreasing pt's back pain exacerbation and emphasizing symmetrical gait/standing for improved postural alignment. Pt continues to progress well  with her exercises.    From eval: Patient is a 71 y.o. F who was seen today for physical therapy evaluation and treatment for chronic LBP L>R. Pt with radiating symptoms on L to the front of hip into thigh and down her leg. Assessment significant for L>R LE weakness, glute tenderness/spasm, pelvic obliquity (L higher than R in standing with R trunk lean), and likely L anterior inominate rotation leading to L femoral nerve impingement leading to radiating symptoms affecting gait and mobility at home. Pt would benefit from PT to address these issues to reduce her risk of need of surgery.    OBJECTIVE IMPAIRMENTS Abnormal gait, decreased activity tolerance, decreased mobility, difficulty walking, decreased ROM, decreased strength, hypomobility, increased fascial restrictions, increased muscle spasms, impaired flexibility, improper body mechanics, postural dysfunction, and pain.   ACTIVITY LIMITATIONS lifting, bending, standing, sleeping, stairs, transfers, bed mobility, and locomotion level  PARTICIPATION LIMITATIONS: cleaning, laundry, shopping, and community activity  PERSONAL FACTORS Past/current experiences and Time since onset of injury/illness/exacerbation are also affecting patient's functional outcome.   REHAB POTENTIAL: Good  CLINICAL DECISION MAKING: Evolving/moderate complexity  EVALUATION COMPLEXITY: Moderate   GOALS: Goals reviewed with patient? Yes  SHORT TERM GOALS: Target date: 09/19/2022  Pt will be ind with initial HEP Baseline: Goal status: INITIAL  2.  Pt will demo improved L pelvic mobility during spring testing to feel equal with R Baseline:  Goal status: INITIAL  3.  Pt will be able to demo at least 5 sec on L SLS to demo improving LE stability Baseline:  Goal status: INITIAL  4.  Pt will be able to tolerate performing 5x STS without UEs with equal weight shift with pain </=5/10 to demo improving L LE functional strength Baseline: 7/10 pain at baseline Goal  status: INITIAL    LONG TERM GOALS: Target date: 10/17/2022  Pt will be ind with maintaining community and home wellness exercise program Baseline:  Goal status: INITIAL  2.  Pt will be able to demo SLS for at least 10 sec to demo improved bilat LE stability in standing Baseline:  Goal status: INITIAL  3.  Pt will demo increased at least 4/5 bilat LE strength Baseline:  Goal status: INITIAL  4.  Pt will demo L=R lateral lumbar flexion AROM for improved trunk posture and symmetry for standing balance Baseline:  Goal status: INITIAL  5.  Pt will have increased FOTO score to 41 Baseline: 26 Goal status: INITIAL     PLAN: PT FREQUENCY: 2x/week  PT DURATION: 8 weeks  PLANNED INTERVENTIONS: Therapeutic exercises, Therapeutic activity, Neuromuscular re-education, Balance training, Gait training, Patient/Family education, Self Care, Joint mobilization, Aquatic Therapy, Dry Needling, Electrical stimulation, Spinal mobilization, Cryotherapy, Moist heat, Taping, Ionotophoresis 16m/ml Dexamethasone, Manual therapy, and Re-evaluation.  PLAN FOR NEXT  SESSION: Assess response to HEP. Manual therapy as indicated. Correct sacrum/pelvis alignment. Initiate more balance/stability   [facility] April Gordy Levan, PT, DPT 09/06/2022, 10:58 AM

## 2022-09-08 ENCOUNTER — Encounter: Payer: Self-pay | Admitting: Physical Therapy

## 2022-09-08 ENCOUNTER — Ambulatory Visit: Payer: Medicare HMO | Admitting: Physical Therapy

## 2022-09-08 DIAGNOSIS — M545 Low back pain, unspecified: Secondary | ICD-10-CM | POA: Diagnosis not present

## 2022-09-08 DIAGNOSIS — G8929 Other chronic pain: Secondary | ICD-10-CM

## 2022-09-08 DIAGNOSIS — R29898 Other symptoms and signs involving the musculoskeletal system: Secondary | ICD-10-CM

## 2022-09-08 DIAGNOSIS — M5442 Lumbago with sciatica, left side: Secondary | ICD-10-CM | POA: Diagnosis not present

## 2022-09-08 DIAGNOSIS — M6281 Muscle weakness (generalized): Secondary | ICD-10-CM | POA: Diagnosis not present

## 2022-09-08 NOTE — Therapy (Signed)
OUTPATIENT PHYSICAL THERAPY TREATMENT   Patient Name: Brandyn Thien MRN: [id number] DOB:1950/12/26, 71 y.o., female Today's Date: 09/08/2022   PT End of Session - 09/08/22 1153     Visit Number 4    Number of Visits 16    Date for PT Re-Evaluation 10/17/22    Authorization Type [employer]    Authorization - Visit Number 3    Authorization - Number of Visits 16    Progress Note Due on Visit 10    PT Start Time 1150    PT Stop Time 1230    PT Time Calculation (min) 40 min    Activity Tolerance Patient tolerated treatment well    Behavior During Therapy WFL for tasks assessed/performed              Past Medical History:  Diagnosis Date   Allergy 1974   Anxiety 2021   COPD (chronic obstructive pulmonary disease) ( [place])    per pt no issues, chest CT in epic 07-29-2021   DDD (degenerative disc disease), cervical    DDD (degenerative disc disease), lumbar    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    Depression    2021   Facial asymmetry, acquired 1974   left side paralysis   GERD (gastroesophageal reflux disease) 2021   H/O: Bell's palsy 1974   per pt during pregnancy , left side paralysis, told possible bell's palsy or stroke, after testing done had left mastoidectomy;  residual left side facialy asymmetry   History of basal cell carcinoma (BCC) excision    History of cerebral aneurysm 2010   s/p coiling  ;  per pt was an incidental finding on imaging   History of Clostridioides difficile infection 2010   treated   History of COVID-19 05/08/2021   positive result in care everywhere; per pt mild to moderate symptoms that resolved   History of Helicobacter pylori infection 2010   treated   Hyperlipidemia    Hypertension    followed by pcp   Lymphocytic colitis    followed by dr c. Shary Key (GI);  dx by biopsy 09-13-2017   Macular degeneration of both eyes    Non-intractable vomiting with nausea    followed by dr  Shary Key   OA (osteoarthritis)    Osteoporosis    PONV (postoperative nausea and vomiting)    Spondylosis, unspecified    cervical and lumbar   Wears hearing aid in both ears    Past Surgical History:  Procedure Laterality Date   CARPAL TUNNEL RELEASE Bilateral    early 2000s   CATARACT EXTRACTION W/ INTRAOCULAR LENS IMPLANT Bilateral 2014   CEREBRAL ANEURYSM REPAIR  2010   endocerebral coiling   COLONOSCOPY  09/13/2017   COSMETIC SURGERY     [facility]rs Surgery on nose   DISTAL INTERPHALANGEAL JOINT FUSION Left 09/16/2021   Procedure: left small finger distal interphalangeal joint fusion;  Surgeon: [name], MD;  Location: [place];  Service: Orthopedics;  Laterality: Left;  with MAC anesthesia   ESOPHAGOGASTRODUODENOSCOPY  09/22/2020   EYE SURGERY  2020   Blepharoplasty   FOOT SURGERY Bilateral    eary 2000s;  bilateral bunionectomy  and removal bone right 5th toe   FRACTURE SURGERY  1968   Shattered right ankle   MASTOIDECTOMY Left 1974   RADIOFREQUENCY ABLATION  09/01/2021   lumbar , left side   ROTATOR CUFF REPAIR Right 2014   SALPINGOOPHORECTOMY Bilateral 1995   w/  excision endometriosis via laparotomy   SPINE SURGERY  2021/2022   DDD, osteoarthritis   TONSILLECTOMY AND ADENOIDECTOMY  1970   TOTAL ABDOMINAL HYSTERECTOMY  1987   TRIGGER FINGER RELEASE Bilateral    early 2000s; one finger each hand   TUBAL LIGATION  1982   WRIST GANGLION EXCISION Left    2000s   Patient Active Problem List   Diagnosis Date Noted   Osteoporosis 11/05/2020   DDD (degenerative disc disease), cervical 07/06/2020   Facet arthritis of lumbar region 05/06/2020   Bilateral wrist pain 05/06/2020   Inflammatory arthritis 03/13/2020   Osteoarthritis of left little finger 07/31/2018   Acute bronchitis with COPD ([place]) 05/10/2018   Impacted cerumen of left ear 03/07/2018   History of left mastoidectomy 03/07/2018   Tympanosclerosis, left ear 03/07/2018   Cardiac risk  counseling 09/05/2017   Essential hypertension 09/05/2017   Mixed hyperlipidemia 09/05/2017   History of Bell's palsy 08/15/2017   History of osteoporosis 08/15/2017   History of colon polyps 08/15/2017   History of nonmelanoma skin cancer 08/15/2017   Brain aneurysm 08/15/2017   Bilateral hearing loss 08/15/2017   H/O hysterectomy for benign disease 08/15/2017    PCP: [name]  REFERRING PROVIDER: [name]   REFERRING DIAG: M51.16 (ICD-10-CM) - Intervertebral disc disorders with radiculopathy, lumbar region  Rationale for Evaluation and Treatment Rehabilitation  THERAPY DIAG:  Chronic bilateral low back pain with left-sided sciatica  Other symptoms and signs involving the musculoskeletal system  Muscle weakness (generalized)  Acute bilateral low back pain without sciatica  ONSET DATE: 2-3 years  SUBJECTIVE:                                                                                                                                                                                           SUBJECTIVE STATEMENT: 09/08/22: Pt reports she walked a lot yesterday. States she feels a lot in her back today. She has not been able to do her exercises.   From eval: Pt reports continued back pain. Has had all the shots, ablations, and blocks and the back pain has continued. Neurosurgeon is recommending back surgery but pt does not want it. Pt states she wants to try strengthening and PT first before considering surgery. Started to have back pain on the right side but not as bad (~3 weeks ago). Left side back pain has been ongoing for 2-3 years. Has tried aquatics. Has not been able to go to the [facility]  PERTINENT HISTORY:  Has had PT in the past; history of multiple lumbar injections, ablations and blocks  PAIN:  Are you having pain? Yes: NPRS  scale: currently 7/10 Pain location: Left low back and comes across front of thigh into leg Pain description: Throbbing pain, as it  works to front of the hip it gets more painful Aggravating factors: Walking, house work Relieving factors: Heat/ice   PRECAUTIONS: None  WEIGHT BEARING RESTRICTIONS No  FALLS:  Has patient fallen in last 6 months? No  LIVING ENVIRONMENT: Lives with: lives with their spouse Lives in: House/apartment Stairs: Yes: External: 2 steps; on right going up Has following equipment at home: None  OCCUPATION: Retired  PLOF: Independent  PATIENT GOALS Decrease pain   OBJECTIVE:    PATIENT SURVEYS:  FOTO 26; predicted 41  MUSCLE LENGTH: Hamstrings: Right ~80 deg; Left ~70 deg (from eval)  POSTURE:  R trunk lean, L iliac crest higher than R, R weight shift   (from eval)  PALPATION: Hypomobile with L ASIS PA mobs, TTP bilat glutes L>R, L QL, bilat piriformis. L PSIS PA mobs causes R hip spasm (from eval)  LUMBAR ROM:   Active  A/PROM  eval  Flexion 75% Feels pull  Extension 50% Increases pain  Right lateral flexion To knee feels pain  Left lateral flexion 3 inches above knee feels pull  Right rotation WFL  Left rotation Feels pull on back and L groin but WFL   (Blank rows = not tested)  LOWER EXTREMITY MMT  MMT Right eval Left eval  Hip flexion 3 3  Hip extension 3 3  Hip abduction 3+ 3+  Hip adduction    Hip internal rotation    Hip external rotation    Knee flexion 4 3+  Knee extension 4 4  Ankle dorsiflexion    Ankle plantarflexion    Ankle inversion    Ankle eversion     (Blank rows = not tested)    LUMBAR SPECIAL TESTS:  Straight leg raise test: L LE feels it in groin, Slump test: Positive, Single leg stance test: Positive, and FABER test: Positive on L; anteriorly tilted L inominate  FUNCTIONAL TESTS:  SLS: 5 sec on R, 3 sec on L (trendlenburg noted bilat) (from eval)     TODAY'S TREATMENT  09/08/22:  THEREX Seated Figure 4 stretch 2x30 sec bilat Hamstring stretch 2x30 sec bilat Hip flexor stretch 2x30 sec bilat  Standing Lateral hip  shift 10x3 sec R&L  Quadruped Child's pose 2x30 sec; x30 sec with lateral flexion each side  Supine MET to decrease L posterior inominate  Bridge with ball squeeze x10 Bridge with abd iso x10 Hip abd green TB 2x10  Sitting Hip hinge x10 Sit<>stand x 10 focus on equal weight shift  09/06/22: THEREX Seated Piriformis stretch x30 sec Hamstring stretch 2x 30 sec  Prone Hip ext 2x10 Hip IR 2x10  Sidelying Oblique/TSA stretch with leg off the table x2 min  Standing Lateral hip shift 2x10 R&L Wall squat 2x10  NMED Amb focusing on hip and trunk control reducing Trendelenburg, lateral hip shift Sitting posture with pool noodle along spine to decrease R trunk lean  MANUAL THERAPY STM & TPR posterior TSA, QL, glutes on L   MODALITIES TENS x15 min to pt tolerance Moist heat x15 min   09/01/22: Seated Piriformis stretch x30 sec Hamstring stretch 2x 30 sec  Supine Hip flexor stretch 2x30 sec Hip flexion contract/relax to encourage L posterior inominate rotation 5x5 sec LTR x30 sec L&R Butterfly stretch x30 sec Clamshell green TB 2x10 Bridge with ball squeeze 2x10 Pelvic tilts x10  Standing Lateral hip shifts x10 R &  L Mini wall squat 2x10  Manual therapy:  Sidelying L posterior pelvic rotations  08/22/22: See HEP   PATIENT EDUCATION:  Education details: Discussed exam findings, POC, and initial HEP Person educated: Patient Education method: Explanation, Demonstration, and Handouts Education comprehension: verbalized understanding, returned demonstration, and needs further education   HOME EXERCISE PROGRAM: Access Code: [email or web address] URL: [email or web address] Date: 08/22/2022 Prepared by: Estill Bamberg April Thurnell Garbe  Exercises - Supine Butterfly Groin Stretch  - 1 x daily - 7 x weekly - 2 sets - 30 sec hold - Supine Piriformis Stretch with Foot on Ground  - 1 x daily - 7 x weekly - 2 sets - 30 sec hold - Seated Hip Flexor Stretch  - 1 x daily  - 7 x weekly - 2 sets - 30 sec hold - Right Standing Lateral Shift Correction at Wall - Repetitions  - 1 x daily - 7 x weekly - 3 sets - 10 sec hold  ASSESSMENT:  CLINICAL IMPRESSION: 09/06/22: Pt with improving pelvic symmetry and rotation. Session focused on decreasing pt's back pain exacerbation and emphasizing symmetrical gait/standing for improved postural alignment. Pt continues to progress well with her exercises.    From eval: Patient is a 71 y.o. F who was seen today for physical therapy evaluation and treatment for chronic LBP L>R. Pt with radiating symptoms on L to the front of hip into thigh and down her leg. Assessment significant for L>R LE weakness, glute tenderness/spasm, pelvic obliquity (L higher than R in standing with R trunk lean), and likely L anterior inominate rotation leading to L femoral nerve impingement leading to radiating symptoms affecting gait and mobility at home. Pt would benefit from PT to address these issues to reduce her risk of need of surgery.    OBJECTIVE IMPAIRMENTS Abnormal gait, decreased activity tolerance, decreased mobility, difficulty walking, decreased ROM, decreased strength, hypomobility, increased fascial restrictions, increased muscle spasms, impaired flexibility, improper body mechanics, postural dysfunction, and pain.   ACTIVITY LIMITATIONS lifting, bending, standing, sleeping, stairs, transfers, bed mobility, and locomotion level  PARTICIPATION LIMITATIONS: cleaning, laundry, shopping, and community activity  PERSONAL FACTORS Past/current experiences and Time since onset of injury/illness/exacerbation are also affecting patient's functional outcome.   REHAB POTENTIAL: Good  CLINICAL DECISION MAKING: Evolving/moderate complexity  EVALUATION COMPLEXITY: Moderate   GOALS: Goals reviewed with patient? Yes  SHORT TERM GOALS: Target date: 09/19/2022  Pt will be ind with initial HEP Baseline: Goal status: INITIAL  2.  Pt will demo  improved L pelvic mobility during spring testing to feel equal with R Baseline:  Goal status: INITIAL  3.  Pt will be able to demo at least 5 sec on L SLS to demo improving LE stability Baseline:  Goal status: INITIAL  4.  Pt will be able to tolerate performing 5x STS without UEs with equal weight shift with pain </=5/10 to demo improving L LE functional strength Baseline: 7/10 pain at baseline Goal status: INITIAL    LONG TERM GOALS: Target date: 10/17/2022  Pt will be ind with maintaining community and home wellness exercise program Baseline:  Goal status: INITIAL  2.  Pt will be able to demo SLS for at least 10 sec to demo improved bilat LE stability in standing Baseline:  Goal status: INITIAL  3.  Pt will demo increased at least 4/5 bilat LE strength Baseline:  Goal status: INITIAL  4.  Pt will demo L=R lateral lumbar flexion AROM for improved trunk posture and symmetry for standing balance Baseline:  Goal status: INITIAL  5.  Pt will have increased FOTO score to 41 Baseline: 26 Goal status: INITIAL     PLAN: PT FREQUENCY: 2x/week  PT DURATION: 8 weeks  PLANNED INTERVENTIONS: Therapeutic exercises, Therapeutic activity, Neuromuscular re-education, Balance training, Gait training, Patient/Family education, Self Care, Joint mobilization, Aquatic Therapy, Dry Needling, Electrical stimulation, Spinal mobilization, Cryotherapy, Moist heat, Taping, Ionotophoresis 5m/ml Dexamethasone, Manual therapy, and Re-evaluation.  PLAN FOR NEXT SESSION: Assess response to HEP. Manual therapy as indicated. Correct sacrum/pelvis alignment. Initiate more balance/stability   Evone Arseneau April MGordy Levan PT, DPT 09/08/2022, 11:54 AM

## 2022-09-09 DIAGNOSIS — Z23 Encounter for immunization: Secondary | ICD-10-CM | POA: Diagnosis not present

## 2022-09-13 ENCOUNTER — Encounter: Payer: Self-pay | Admitting: Physical Therapy

## 2022-09-13 ENCOUNTER — Ambulatory Visit: Payer: Medicare HMO | Admitting: Physical Therapy

## 2022-09-13 DIAGNOSIS — G8929 Other chronic pain: Secondary | ICD-10-CM | POA: Diagnosis not present

## 2022-09-13 DIAGNOSIS — R29898 Other symptoms and signs involving the musculoskeletal system: Secondary | ICD-10-CM | POA: Diagnosis not present

## 2022-09-13 DIAGNOSIS — M545 Low back pain, unspecified: Secondary | ICD-10-CM | POA: Diagnosis not present

## 2022-09-13 DIAGNOSIS — M5442 Lumbago with sciatica, left side: Secondary | ICD-10-CM | POA: Diagnosis not present

## 2022-09-13 DIAGNOSIS — M6281 Muscle weakness (generalized): Secondary | ICD-10-CM

## 2022-09-13 NOTE — Therapy (Signed)
OUTPATIENT PHYSICAL THERAPY TREATMENT   Patient Name: Krista [name] MRN: [id number] DOB:21-Apr-1951, 71 y.o., female Today's Date: 09/13/2022   PT End of Session - 09/13/22 1100     Visit Number 5    Number of Visits 16    Date for PT Re-Evaluation 10/17/22    Authorization Type [employer]    Authorization - Visit Number 4    Authorization - Number of Visits 16    Progress Note Due on Visit 10    PT Start Time 1101    PT Stop Time 1145    PT Time Calculation (min) 44 min    Activity Tolerance Patient tolerated treatment well    Behavior During Therapy WFL for tasks assessed/performed              Past Medical History:  Diagnosis Date   Allergy 1974   Anxiety 2021   COPD (chronic obstructive pulmonary disease) ([place])    per pt no issues, chest CT in epic 07-29-2021   DDD (degenerative disc disease), cervical    DDD (degenerative disc disease), lumbar    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    Depression    2021   Facial asymmetry, acquired 1974   left side paralysis   GERD (gastroesophageal reflux disease) 2021   H/O: Bell's palsy 1974   per pt during pregnancy , left side paralysis, told possible bell's palsy or stroke, after testing done had left mastoidectomy;  residual left side facialy asymmetry   History of basal cell carcinoma (BCC) excision    History of cerebral aneurysm 2010   s/p coiling  ;  per pt was an incidental finding on imaging   History of Clostridioides difficile infection 2010   treated   History of COVID-19 05/08/2021   positive result in care everywhere; per pt mild to moderate symptoms that resolved   History of Helicobacter pylori infection 2010   treated   Hyperlipidemia    Hypertension    followed by pcp   Lymphocytic colitis    followed by dr c. Shary Key (GI);  dx by biopsy 09-13-2017   Macular degeneration of both eyes    Non-intractable vomiting with nausea    followed by dr  Shary Key   OA (osteoarthritis)    Osteoporosis    PONV (postoperative nausea and vomiting)    Spondylosis, unspecified    cervical and lumbar   Wears hearing aid in both ears    Past Surgical History:  Procedure Laterality Date   CARPAL TUNNEL RELEASE Bilateral    early 2000s   CATARACT EXTRACTION W/ INTRAOCULAR LENS IMPLANT Bilateral 2014   CEREBRAL ANEURYSM REPAIR  2010   endocerebral coiling   COLONOSCOPY  09/13/2017   COSMETIC SURGERY     [facility] Surgery on nose   DISTAL INTERPHALANGEAL JOINT FUSION Left 09/16/2021   Procedure: left small finger distal interphalangeal joint fusion;  Surgeon: [name], MD;  Location: [facility];  Service: Orthopedics;  Laterality: Left;  with MAC anesthesia   ESOPHAGOGASTRODUODENOSCOPY  09/22/2020   EYE SURGERY  2020   Blepharoplasty   FOOT SURGERY Bilateral    eary 2000s;  bilateral bunionectomy  and removal bone right 5th toe   FRACTURE SURGERY  1968   Shattered right ankle   MASTOIDECTOMY Left 1974   RADIOFREQUENCY ABLATION  09/01/2021   lumbar , left side   ROTATOR CUFF REPAIR Right 2014   SALPINGOOPHORECTOMY Bilateral 1995   w/  excision endometriosis via laparotomy   SPINE SURGERY  2021/2022   DDD, osteoarthritis   TONSILLECTOMY AND ADENOIDECTOMY  1970   TOTAL ABDOMINAL HYSTERECTOMY  1987   TRIGGER FINGER RELEASE Bilateral    early 2000s; one finger each hand   TUBAL LIGATION  1982   WRIST GANGLION EXCISION Left    2000s   Patient Active Problem List   Diagnosis Date Noted   Osteoporosis 11/05/2020   DDD (degenerative disc disease), cervical 07/06/2020   Facet arthritis of lumbar region 05/06/2020   Bilateral wrist pain 05/06/2020   Inflammatory arthritis 03/13/2020   Osteoarthritis of left little finger 07/31/2018   Acute bronchitis with COPD ([place]) 05/10/2018   Impacted cerumen of left ear 03/07/2018   History of left mastoidectomy 03/07/2018   Tympanosclerosis, left ear 03/07/2018   Cardiac risk  counseling 09/05/2017   Essential hypertension 09/05/2017   Mixed hyperlipidemia 09/05/2017   History of Bell's palsy 08/15/2017   History of osteoporosis 08/15/2017   History of colon polyps 08/15/2017   History of nonmelanoma skin cancer 08/15/2017   Brain aneurysm 08/15/2017   Bilateral hearing loss 08/15/2017   H/O hysterectomy for benign disease 08/15/2017    PCP: [name]  REFERRING PROVIDER: [name]   REFERRING DIAG: M51.16 (ICD-10-CM) - Intervertebral disc disorders with radiculopathy, lumbar region  Rationale for Evaluation and Treatment Rehabilitation  THERAPY DIAG:  Chronic bilateral low back pain with left-sided sciatica  Other symptoms and signs involving the musculoskeletal system  Muscle weakness (generalized)  Acute bilateral low back pain without sciatica  ONSET DATE: 2-3 years  SUBJECTIVE:                                                                                                                                                                                           SUBJECTIVE STATEMENT: 09/13/22: Pt states she is starting back at the [employer]. Has continued to do exercises.   From eval: Pt reports continued back pain. Has had all the shots, ablations, and blocks and the back pain has continued. Neurosurgeon is recommending back surgery but pt does not want it. Pt states she wants to try strengthening and PT first before considering surgery. Started to have back pain on the right side but not as bad (~3 weeks ago). Left side back pain has been ongoing for 2-3 years. Has tried aquatics. Has not been able to go to the [facility]  PERTINENT HISTORY:  Has had PT in the past; history of multiple lumbar injections, ablations and blocks  PAIN:  Are you having pain? Yes: NPRS scale: currently 5/10 Pain location: Left low back and comes  across front of thigh into leg Pain description: Throbbing pain, as it works to front of the hip it gets more  painful Aggravating factors: Walking, house work Relieving factors: Heat/ice   PRECAUTIONS: None  WEIGHT BEARING RESTRICTIONS No  FALLS:  Has patient fallen in last 6 months? No  LIVING ENVIRONMENT: Lives with: lives with their spouse Lives in: House/apartment Stairs: Yes: External: 2 steps; on right going up Has following equipment at home: None  OCCUPATION: Retired  PLOF: Independent  PATIENT GOALS Decrease pain   OBJECTIVE:    PATIENT SURVEYS:  FOTO 26; predicted 41  MUSCLE LENGTH: Hamstrings: Right ~80 deg; Left ~70 deg (from eval)  POSTURE:  R trunk lean, L iliac crest higher than R, R weight shift   (from eval)  PALPATION: Hypomobile with L ASIS PA mobs, TTP bilat glutes L>R, L QL, bilat piriformis. L PSIS PA mobs causes R hip spasm (from eval)  LUMBAR ROM:   Active  A/PROM  eval  Flexion 75% Feels pull  Extension 50% Increases pain  Right lateral flexion To knee feels pain  Left lateral flexion 3 inches above knee feels pull  Right rotation WFL  Left rotation Feels pull on back and L groin but WFL   (Blank rows = not tested)  LOWER EXTREMITY MMT  MMT Right eval Left eval  Hip flexion 3 3  Hip extension 3 3  Hip abduction 3+ 3+  Hip adduction    Hip internal rotation    Hip external rotation    Knee flexion 4 3+  Knee extension 4 4  Ankle dorsiflexion    Ankle plantarflexion    Ankle inversion    Ankle eversion     (Blank rows = not tested)    LUMBAR SPECIAL TESTS:  Straight leg raise test: L LE feels it in groin, Slump test: Positive, Single leg stance test: Positive, and FABER test: Positive on L; anteriorly tilted L inominate  FUNCTIONAL TESTS:  SLS: 5 sec on R, 3 sec on L (trend[place] noted bilat) (from eval)     TODAY'S TREATMENT  09/13/22:  THEREX Seated Figure 4 stretch 2x30 sec bilat Hamstring stretch 2x30 sec bilat Hip flexor stretch 2x30 sec bilat Sit<>stand 2x5 with 5#  Standing Lateral hip shift 10x3 sec L  only Wall squat 2x10  Supine SLR 2x10 on L&R Bridge with ball squeeze 2x10 Clamshell green TB 2x10  Quadruped Child's pose x30 sec; x30 sec with lateral flexion each side Hip ext 2x10   09/08/22:  THEREX Seated Figure 4 stretch 2x30 sec bilat Hamstring stretch 2x30 sec bilat Hip flexor stretch 2x30 sec bilat  Standing Lateral hip shift 10x3 sec R&L Lumbopelvic side glides x10  Quadruped Child's pose 2x30 sec; x30 sec with lateral flexion each side  Supine MET to decrease L posterior inominate  Bridge with ball squeeze x10 Bridge with abd iso x10 Hip abd green TB 2x10  Sitting Hip hinge x10 Sit<>stand x 10 focus on equal weight shift  09/06/22: THEREX Seated Piriformis stretch x30 sec Hamstring stretch 2x 30 sec  Prone Hip ext 2x10 Hip IR 2x10  Sidelying Oblique/TSA stretch with leg off the table x2 min  Standing Lateral hip shift 2x10 R&L Wall squat 2x10  NMED Amb focusing on hip and trunk control reducing Trendelenburg, lateral hip shift Sitting posture with pool noodle along spine to decrease R trunk lean  MANUAL THERAPY STM & TPR posterior TSA, QL, glutes on L   MODALITIES TENS x15 min to  pt tolerance Moist heat x15 min      PATIENT EDUCATION:  Education details: Discussed exam findings, POC, and initial HEP Person educated: Patient Education method: Explanation, Demonstration, and Handouts Education comprehension: verbalized understanding, returned demonstration, and needs further education   HOME EXERCISE PROGRAM: Access Code: [email or web address] URL: [email or web address] Date: 08/22/2022 Prepared by: Estill Bamberg April Thurnell Garbe  Exercises - Supine Butterfly Groin Stretch  - 1 x daily - 7 x weekly - 2 sets - 30 sec hold - Supine Piriformis Stretch with Foot on Ground  - 1 x daily - 7 x weekly - 2 sets - 30 sec hold - Seated Hip Flexor Stretch  - 1 x daily - 7 x weekly - 2 sets - 30 sec hold - Right Standing Lateral Shift  Correction at Wall - Repetitions  - 1 x daily - 7 x weekly - 3 sets - 10 sec hold  ASSESSMENT:  CLINICAL IMPRESSION: 10/10: Pt with improving pelvic alignment. Less pain noted today. Continued to work on pelvis, core, and hip strengthening. Able to progress pt to using weight.   From eval: Patient is a 71 y.o. F who was seen today for physical therapy evaluation and treatment for chronic LBP L>R. Pt with radiating symptoms on L to the front of hip into thigh and down her leg. Assessment significant for L>R LE weakness, glute tenderness/spasm, pelvic obliquity (L higher than R in standing with R trunk lean), and likely L anterior inominate rotation leading to L femoral nerve impingement leading to radiating symptoms affecting gait and mobility at home. Pt would benefit from PT to address these issues to reduce her risk of need of surgery.    OBJECTIVE IMPAIRMENTS Abnormal gait, decreased activity tolerance, decreased mobility, difficulty walking, decreased ROM, decreased strength, hypomobility, increased fascial restrictions, increased muscle spasms, impaired flexibility, improper body mechanics, postural dysfunction, and pain.   ACTIVITY LIMITATIONS lifting, bending, standing, sleeping, stairs, transfers, bed mobility, and locomotion level  PARTICIPATION LIMITATIONS: cleaning, laundry, shopping, and community activity  PERSONAL FACTORS Past/current experiences and Time since onset of injury/illness/exacerbation are also affecting patient's functional outcome.   REHAB POTENTIAL: Good  CLINICAL DECISION MAKING: Evolving/moderate complexity  EVALUATION COMPLEXITY: Moderate   GOALS: Goals reviewed with patient? Yes  SHORT TERM GOALS: Target date: 09/19/2022  Pt will be ind with initial HEP Baseline: Goal status: INITIAL  2.  Pt will demo improved L pelvic mobility during spring testing to feel equal with R Baseline:  Goal status: INITIAL  3.  Pt will be able to demo at least 5 sec  on L SLS to demo improving LE stability Baseline:  Goal status: INITIAL  4.  Pt will be able to tolerate performing 5x STS without UEs with equal weight shift with pain </=5/10 to demo improving L LE functional strength Baseline: 7/10 pain at baseline Goal status: INITIAL    LONG TERM GOALS: Target date: 10/17/2022  Pt will be ind with maintaining community and home wellness exercise program Baseline:  Goal status: INITIAL  2.  Pt will be able to demo SLS for at least 10 sec to demo improved bilat LE stability in standing Baseline:  Goal status: INITIAL  3.  Pt will demo increased at least 4/5 bilat LE strength Baseline:  Goal status: INITIAL  4.  Pt will demo L=R lateral lumbar flexion AROM for improved trunk posture and symmetry for standing balance Baseline:  Goal status: INITIAL  5.  Pt will have increased FOTO score to 41 Baseline: 26  Goal status: INITIAL     PLAN: PT FREQUENCY: 2x/week  PT DURATION: 8 weeks  PLANNED INTERVENTIONS: Therapeutic exercises, Therapeutic activity, Neuromuscular re-education, Balance training, Gait training, Patient/Family education, Self Care, Joint mobilization, Aquatic Therapy, Dry Needling, Electrical stimulation, Spinal mobilization, Cryotherapy, Moist heat, Taping, Ionotophoresis 61m/ml Dexamethasone, Manual therapy, and Re-evaluation.  PLAN FOR NEXT SESSION: Assess response to HEP. Manual therapy as indicated. Correct sacrum/pelvis alignment. Initiate more balance/stability   Dink Creps April Ma L Brithney Bensen, PT, DPT 09/13/2022, 11:01 AM

## 2022-09-15 ENCOUNTER — Ambulatory Visit: Payer: Medicare HMO | Admitting: Physical Therapy

## 2022-09-15 ENCOUNTER — Encounter: Payer: Self-pay | Admitting: Physical Therapy

## 2022-09-15 DIAGNOSIS — M6281 Muscle weakness (generalized): Secondary | ICD-10-CM

## 2022-09-15 DIAGNOSIS — G8929 Other chronic pain: Secondary | ICD-10-CM

## 2022-09-15 DIAGNOSIS — M5442 Lumbago with sciatica, left side: Secondary | ICD-10-CM | POA: Diagnosis not present

## 2022-09-15 DIAGNOSIS — M545 Low back pain, unspecified: Secondary | ICD-10-CM | POA: Diagnosis not present

## 2022-09-15 DIAGNOSIS — R29898 Other symptoms and signs involving the musculoskeletal system: Secondary | ICD-10-CM

## 2022-09-15 NOTE — Therapy (Signed)
OUTPATIENT PHYSICAL THERAPY TREATMENT   Patient Name: Krista [name] MRN: [id number] DOB:May 21, 1951, 71 y.o., female Today's Date: 09/15/2022   PT End of Session - 09/15/22 1153     Visit Number 6    Number of Visits 16    Date for PT Re-Evaluation 10/17/22    Authorization Type [employer]    Authorization - Visit Number 5    Authorization - Number of Visits 16    Progress Note Due on Visit 10    PT Start Time 0034    PT Stop Time 1230    PT Time Calculation (min) 45 min    Activity Tolerance Patient tolerated treatment well    Behavior During Therapy WFL for tasks assessed/performed              Past Medical History:  Diagnosis Date   Allergy 1974   Anxiety 2021   COPD (chronic obstructive pulmonary disease) ([place])    per pt no issues, chest CT in epic 07-29-2021   DDD (degenerative disc disease), cervical    DDD (degenerative disc disease), lumbar    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    Depression    2021   Facial asymmetry, acquired 1974   left side paralysis   GERD (gastroesophageal reflux disease) 2021   H/O: Bell's palsy 1974   per pt during pregnancy , left side paralysis, told possible bell's palsy or stroke, after testing done had left mastoidectomy;  residual left side facialy asymmetry   History of basal cell carcinoma (BCC) excision    History of cerebral aneurysm 2010   s/p coiling  ;  per pt was an incidental finding on imaging   History of Clostridioides difficile infection 2010   treated   History of COVID-19 05/08/2021   positive result in care everywhere; per pt mild to moderate symptoms that resolved   History of Helicobacter pylori infection 2010   treated   Hyperlipidemia    Hypertension    followed by pcp   Lymphocytic colitis    followed by dr c. Shary Key (GI);  dx by biopsy 09-13-2017   Macular degeneration of both eyes    Non-intractable vomiting with nausea    followed by dr  Shary Key   OA (osteoarthritis)    Osteoporosis    PONV (postoperative nausea and vomiting)    Spondylosis, unspecified    cervical and lumbar   Wears hearing aid in both ears    Past Surgical History:  Procedure Laterality Date   CARPAL TUNNEL RELEASE Bilateral    early 2000s   CATARACT EXTRACTION W/ INTRAOCULAR LENS IMPLANT Bilateral 2014   CEREBRAL ANEURYSM REPAIR  2010   endocerebral coiling   COLONOSCOPY  09/13/2017   COSMETIC SURGERY     [facility] Surgery on nose   DISTAL INTERPHALANGEAL JOINT FUSION Left 09/16/2021   Procedure: left small finger distal interphalangeal joint fusion;  Surgeon: [name], MD;  Location: [place];  Service: Orthopedics;  Laterality: Left;  with MAC anesthesia   ESOPHAGOGASTRODUODENOSCOPY  09/22/2020   EYE SURGERY  2020   Blepharoplasty   FOOT SURGERY Bilateral    eary 2000s;  bilateral bunionectomy  and removal bone right 5th toe   FRACTURE SURGERY  1968   Shattered right ankle   MASTOIDECTOMY Left 1974   RADIOFREQUENCY ABLATION  09/01/2021   lumbar , left side   ROTATOR CUFF REPAIR Right 2014   SALPINGOOPHORECTOMY Bilateral 1995   w/  excision endometriosis via laparotomy   SPINE SURGERY  2021/2022   DDD, osteoarthritis   TONSILLECTOMY AND ADENOIDECTOMY  1970   TOTAL ABDOMINAL HYSTERECTOMY  1987   TRIGGER FINGER RELEASE Bilateral    early 2000s; one finger each hand   TUBAL LIGATION  1982   WRIST GANGLION EXCISION Left    2000s   Patient Active Problem List   Diagnosis Date Noted   Osteoporosis 11/05/2020   DDD (degenerative disc disease), cervical 07/06/2020   Facet arthritis of lumbar region 05/06/2020   Bilateral wrist pain 05/06/2020   Inflammatory arthritis 03/13/2020   Osteoarthritis of left little finger 07/31/2018   Acute bronchitis with COPD ([place]) 05/10/2018   Impacted cerumen of left ear 03/07/2018   History of left mastoidectomy 03/07/2018   Tympanosclerosis, left ear 03/07/2018   Cardiac risk  counseling 09/05/2017   Essential hypertension 09/05/2017   Mixed hyperlipidemia 09/05/2017   History of Bell's palsy 08/15/2017   History of osteoporosis 08/15/2017   History of colon polyps 08/15/2017   History of nonmelanoma skin cancer 08/15/2017   Brain aneurysm 08/15/2017   Bilateral hearing loss 08/15/2017   H/O hysterectomy for benign disease 08/15/2017    PCP: [name]  REFERRING PROVIDER: [name]   REFERRING DIAG: M51.16 (ICD-10-CM) - Intervertebral disc disorders with radiculopathy, lumbar region  Rationale for Evaluation and Treatment Rehabilitation  THERAPY DIAG:  Chronic bilateral low back pain with left-sided sciatica  Other symptoms and signs involving the musculoskeletal system  Muscle weakness (generalized)  Acute bilateral low back pain without sciatica  ONSET DATE: 2-3 years  SUBJECTIVE:                                                                                                                                                                                           SUBJECTIVE STATEMENT: 09/15/22: Pt reports she had a luncheon yesterday with friends. Nothing new to report.   From eval: Pt reports continued back pain. Has had all the shots, ablations, and blocks and the back pain has continued. Neurosurgeon is recommending back surgery but pt does not want it. Pt states she wants to try strengthening and PT first before considering surgery. Started to have back pain on the right side but not as bad (~3 weeks ago). Left side back pain has been ongoing for 2-3 years. Has tried aquatics. Has not been able to go to the [facility]  PERTINENT HISTORY:  Has had PT in the past; history of multiple lumbar injections, ablations and blocks  PAIN:  Are you having pain? Yes: NPRS scale: currently 5/10 Pain location: Left low back and comes across front  of thigh into leg Pain description: Throbbing pain, as it works to front of the hip it gets more  painful Aggravating factors: Walking, house work Relieving factors: Heat/ice   PRECAUTIONS: None  WEIGHT BEARING RESTRICTIONS No  FALLS:  Has patient fallen in last 6 months? No  LIVING ENVIRONMENT: Lives with: lives with their spouse Lives in: House/apartment Stairs: Yes: External: 2 steps; on right going up Has following equipment at home: None  OCCUPATION: Retired  PLOF: Independent  PATIENT GOALS Decrease pain   OBJECTIVE:    PATIENT SURVEYS:  FOTO 26; predicted 41  MUSCLE LENGTH: Hamstrings: Right ~80 deg; Left ~70 deg (from eval)  POSTURE:  R trunk lean, L iliac crest higher than R, R weight shift   (from eval)  PALPATION: Hypomobile with L ASIS PA mobs, TTP bilat glutes L>R, L QL, bilat piriformis. L PSIS PA mobs causes R hip spasm (from eval)  LUMBAR ROM:   Active  A/PROM  eval  Flexion 75% Feels pull  Extension 50% Increases pain  Right lateral flexion To knee feels pain  Left lateral flexion 3 inches above knee feels pull  Right rotation WFL  Left rotation Feels pull on back and L groin but WFL   (Blank rows = not tested)  LOWER EXTREMITY MMT  MMT Right eval Left eval  Hip flexion 3 3  Hip extension 3 3  Hip abduction 3+ 3+  Hip adduction    Hip internal rotation    Hip external rotation    Knee flexion 4 3+  Knee extension 4 4  Ankle dorsiflexion    Ankle plantarflexion    Ankle inversion    Ankle eversion     (Blank rows = not tested)    LUMBAR SPECIAL TESTS:  Straight leg raise test: L LE feels it in groin, Slump test: Positive, Single leg stance test: Positive, and FABER test: Positive on L; anteriorly tilted L inominate  FUNCTIONAL TESTS:  SLS: 5 sec on R, 3 sec on L ([place] noted bilat) (from eval)     TODAY'S TREATMENT  09/15/22: THEREX Treadmill 1.1 mph x 5 min   Standing Counter plank at plinth 2x20 sec Counter side plank at plinth 2x20 sec Fire hydrants UE support on counter 2x10 [employer] kick with UE  support on counter 2x10 [place] press green TB 2x10x3 sec Shoulder ext reactive iso green TB x10  Sitting Sit<>stand x10 hands on knees with hip hinge     09/13/22:  THEREX Seated Figure 4 stretch 2x30 sec bilat Hamstring stretch 2x30 sec bilat Hip flexor stretch 2x30 sec bilat Sit<>stand 2x5 with 5#  Standing Lateral hip shift 10x3 sec L only Wall squat 2x10  Supine SLR 2x10 on L&R Bridge with ball squeeze 2x10 Clamshell green TB 2x10  Quadruped Child's pose x30 sec; x30 sec with lateral flexion each side Hip ext 2x10   09/08/22:  THEREX Seated Figure 4 stretch 2x30 sec bilat Hamstring stretch 2x30 sec bilat Hip flexor stretch 2x30 sec bilat  Standing Lateral hip shift 10x3 sec R&L Lumbopelvic side glides x10  Quadruped Child's pose 2x30 sec; x30 sec with lateral flexion each side  Supine MET to decrease L posterior inominate  Bridge with ball squeeze x10 Bridge with abd iso x10 Hip abd green TB 2x10  Sitting Hip hinge x10 Sit<>stand x 10 focus on equal weight shift  09/06/22: THEREX Seated Piriformis stretch x30 sec Hamstring stretch 2x 30 sec  Prone Hip ext 2x10 Hip IR 2x10  Sidelying  Oblique/TSA stretch with leg off the table x2 min  Standing Lateral hip shift 2x10 R&L Wall squat 2x10  NMED Amb focusing on hip and trunk control reducing Trendelenburg, lateral hip shift Sitting posture with pool noodle along spine to decrease R trunk lean  MANUAL THERAPY STM & TPR posterior TSA, QL, glutes on L   MODALITIES TENS x15 min to pt tolerance Moist heat x15 min      PATIENT EDUCATION:  Education details: Discussed exam findings, POC, and initial HEP Person educated: Patient Education method: Explanation, Demonstration, and Handouts Education comprehension: verbalized understanding, returned demonstration, and needs further education   HOME EXERCISE PROGRAM: Access Code: [email or web address] URL: [email or web address] Date:  08/22/2022 Prepared by: Estill Bamberg April Thurnell Garbe  Exercises - Supine Butterfly Groin Stretch  - 1 x daily - 7 x weekly - 2 sets - 30 sec hold - Supine Piriformis Stretch with Foot on Ground  - 1 x daily - 7 x weekly - 2 sets - 30 sec hold - Seated Hip Flexor Stretch  - 1 x daily - 7 x weekly - 2 sets - 30 sec hold - Right Standing Lateral Shift Correction at Wall - Repetitions  - 1 x daily - 7 x weekly - 3 sets - 10 sec hold  ASSESSMENT:  CLINICAL IMPRESSION: 10/12: Treatment session focused primarily on core and pelvic stability. Worked on initiating more exercises in standing to improve trunk stability and posture.   From eval: Patient is a 71 y.o. F who was seen today for physical therapy evaluation and treatment for chronic LBP L>R. Pt with radiating symptoms on L to the front of hip into thigh and down her leg. Assessment significant for L>R LE weakness, glute tenderness/spasm, pelvic obliquity (L higher than R in standing with R trunk lean), and likely L anterior inominate rotation leading to L femoral nerve impingement leading to radiating symptoms affecting gait and mobility at home. Pt would benefit from PT to address these issues to reduce her risk of need of surgery.    OBJECTIVE IMPAIRMENTS Abnormal gait, decreased activity tolerance, decreased mobility, difficulty walking, decreased ROM, decreased strength, hypomobility, increased fascial restrictions, increased muscle spasms, impaired flexibility, improper body mechanics, postural dysfunction, and pain.   ACTIVITY LIMITATIONS lifting, bending, standing, sleeping, stairs, transfers, bed mobility, and locomotion level  PARTICIPATION LIMITATIONS: cleaning, laundry, shopping, and community activity  PERSONAL FACTORS Past/current experiences and Time since onset of injury/illness/exacerbation are also affecting patient's functional outcome.   REHAB POTENTIAL: Good  CLINICAL DECISION MAKING: Evolving/moderate  complexity  EVALUATION COMPLEXITY: Moderate   GOALS: Goals reviewed with patient? Yes  SHORT TERM GOALS: Target date: 09/19/2022  Pt will be ind with initial HEP Baseline: Goal status: INITIAL  2.  Pt will demo improved L pelvic mobility during spring testing to feel equal with R Baseline:  Goal status: INITIAL  3.  Pt will be able to demo at least 5 sec on L SLS to demo improving LE stability Baseline:  Goal status: INITIAL  4.  Pt will be able to tolerate performing 5x STS without UEs with equal weight shift with pain </=5/10 to demo improving L LE functional strength Baseline: 7/10 pain at baseline Goal status: INITIAL    LONG TERM GOALS: Target date: 10/17/2022  Pt will be ind with maintaining community and home wellness exercise program Baseline:  Goal status: INITIAL  2.  Pt will be able to demo SLS for at least 10 sec to demo improved bilat LE stability in standing  Baseline:  Goal status: INITIAL  3.  Pt will demo increased at least 4/5 bilat LE strength Baseline:  Goal status: INITIAL  4.  Pt will demo L=R lateral lumbar flexion AROM for improved trunk posture and symmetry for standing balance Baseline:  Goal status: INITIAL  5.  Pt will have increased FOTO score to 41 Baseline: 26 Goal status: INITIAL     PLAN: PT FREQUENCY: 2x/week  PT DURATION: 8 weeks  PLANNED INTERVENTIONS: Therapeutic exercises, Therapeutic activity, Neuromuscular re-education, Balance training, Gait training, Patient/Family education, Self Care, Joint mobilization, Aquatic Therapy, Dry Needling, Electrical stimulation, Spinal mobilization, Cryotherapy, Moist heat, Taping, Ionotophoresis 56m/ml Dexamethasone, Manual therapy, and Re-evaluation.  PLAN FOR NEXT SESSION: Re-check STGs! Assess response to HEP. Manual therapy as indicated. Correct sacrum/pelvis alignment. Continue balance/stability   [facility] AvanzadaApril Ma L Seaira Byus, PT, DPT 09/15/2022, 11:54 AM

## 2022-09-20 ENCOUNTER — Encounter: Payer: Self-pay | Admitting: Physical Therapy

## 2022-09-20 ENCOUNTER — Ambulatory Visit: Payer: Medicare HMO | Admitting: Physical Therapy

## 2022-09-20 DIAGNOSIS — M5442 Lumbago with sciatica, left side: Secondary | ICD-10-CM | POA: Diagnosis not present

## 2022-09-20 DIAGNOSIS — M545 Low back pain, unspecified: Secondary | ICD-10-CM | POA: Diagnosis not present

## 2022-09-20 DIAGNOSIS — M6281 Muscle weakness (generalized): Secondary | ICD-10-CM | POA: Diagnosis not present

## 2022-09-20 DIAGNOSIS — R29898 Other symptoms and signs involving the musculoskeletal system: Secondary | ICD-10-CM | POA: Diagnosis not present

## 2022-09-20 DIAGNOSIS — G8929 Other chronic pain: Secondary | ICD-10-CM | POA: Diagnosis not present

## 2022-09-20 NOTE — Therapy (Signed)
OUTPATIENT PHYSICAL THERAPY TREATMENT   Patient Name: Krista [name] MRN: [id number] DOB:01-28-1951, 71 y.o., female Today's Date: 09/20/2022   PT End of Session - 09/20/22 1058     Visit Number 7    Number of Visits 16    Date for PT Re-Evaluation 10/17/22    Authorization Type [employer]    Authorization - Visit Number 6    Authorization - Number of Visits 16    Progress Note Due on Visit 10    PT Start Time 1100    PT Stop Time 1145    PT Time Calculation (min) 45 min    Activity Tolerance Patient tolerated treatment well    Behavior During Therapy WFL for tasks assessed/performed              Past Medical History:  Diagnosis Date   Allergy 1974   Anxiety 2021   COPD (chronic obstructive pulmonary disease) ([place])    per pt no issues, chest CT in epic 07-29-2021   DDD (degenerative disc disease), cervical    DDD (degenerative disc disease), lumbar    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    Depression    2021   Facial asymmetry, acquired 1974   left side paralysis   GERD (gastroesophageal reflux disease) 2021   H/O: Bell's palsy 1974   per pt during pregnancy , left side paralysis, told possible bell's palsy or stroke, after testing done had left mastoidectomy;  residual left side facialy asymmetry   History of basal cell carcinoma (BCC) excision    History of cerebral aneurysm 2010   s/p coiling  ;  per pt was an incidental finding on imaging   History of Clostridioides difficile infection 2010   treated   History of COVID-19 05/08/2021   positive result in care everywhere; per pt mild to moderate symptoms that resolved   History of Helicobacter pylori infection 2010   treated   Hyperlipidemia    Hypertension    followed by pcp   Lymphocytic colitis    followed by dr c. Shary Key (GI);  dx by biopsy 09-13-2017   Macular degeneration of both eyes    Non-intractable vomiting with nausea    followed by dr  Shary Key   OA (osteoarthritis)    Osteoporosis    PONV (postoperative nausea and vomiting)    Spondylosis, unspecified    cervical and lumbar   Wears hearing aid in both ears    Past Surgical History:  Procedure Laterality Date   CARPAL TUNNEL RELEASE Bilateral    early 2000s   CATARACT EXTRACTION W/ INTRAOCULAR LENS IMPLANT Bilateral 2014   CEREBRAL ANEURYSM REPAIR  2010   endocerebral coiling   COLONOSCOPY  09/13/2017   COSMETIC SURGERY     [facility] on nose   DISTAL INTERPHALANGEAL JOINT FUSION Left 09/16/2021   Procedure: left small finger distal interphalangeal joint fusion;  Surgeon: [name], MD;  Location: [place] [facility];  Service: Orthopedics;  Laterality: Left;  with MAC anesthesia   ESOPHAGOGASTRODUODENOSCOPY  09/22/2020   EYE SURGERY  2020   Blepharoplasty   FOOT SURGERY Bilateral    eary 2000s;  bilateral bunionectomy  and removal bone right 5th toe   FRACTURE SURGERY  1968   Shattered right ankle   MASTOIDECTOMY Left 1974   RADIOFREQUENCY ABLATION  09/01/2021   lumbar , left side   ROTATOR CUFF REPAIR Right 2014   SALPINGOOPHORECTOMY Bilateral 1995   w/  excision endometriosis via laparotomy   SPINE SURGERY  2021/2022   DDD, osteoarthritis   TONSILLECTOMY AND ADENOIDECTOMY  1970   TOTAL ABDOMINAL HYSTERECTOMY  1987   TRIGGER FINGER RELEASE Bilateral    early 2000s; one finger each hand   TUBAL LIGATION  1982   WRIST GANGLION EXCISION Left    2000s   Patient Active Problem List   Diagnosis Date Noted   Osteoporosis 11/05/2020   DDD (degenerative disc disease), cervical 07/06/2020   Facet arthritis of lumbar region 05/06/2020   Bilateral wrist pain 05/06/2020   Inflammatory arthritis 03/13/2020   Osteoarthritis of left little finger 07/31/2018   Acute bronchitis with COPD ([place]) 05/10/2018   Impacted cerumen of left ear 03/07/2018   History of left mastoidectomy 03/07/2018   Tympanosclerosis, left ear 03/07/2018   Cardiac risk  counseling 09/05/2017   Essential hypertension 09/05/2017   Mixed hyperlipidemia 09/05/2017   History of Bell's palsy 08/15/2017   History of osteoporosis 08/15/2017   History of colon polyps 08/15/2017   History of nonmelanoma skin cancer 08/15/2017   Brain aneurysm 08/15/2017   Bilateral hearing loss 08/15/2017   H/O hysterectomy for benign disease 08/15/2017    PCP: [name]  REFERRING PROVIDER: [name]   REFERRING DIAG: M51.16 (ICD-10-CM) - Intervertebral disc disorders with radiculopathy, lumbar region  Rationale for Evaluation and Treatment Rehabilitation  THERAPY DIAG:  Chronic bilateral low back pain with left-sided sciatica  Other symptoms and signs involving the musculoskeletal system  Muscle weakness (generalized)  Acute bilateral low back pain without sciatica  ONSET DATE: 2-3 years  SUBJECTIVE:                                                                                                                                                                                           SUBJECTIVE STATEMENT: Pt reports back is about the same. Pt states she got RSV shot on Friday and had a reaction. It was swollen on Sunday. Has decreased now.   PERTINENT HISTORY:  Has had PT in the past; history of multiple lumbar injections, ablations and blocks  PAIN:  Are you having pain? Yes: NPRS scale: currently 5/10 Pain location: Left low back and comes across front of thigh into leg Pain description: Throbbing pain, as it works to front of the hip it gets more painful Aggravating factors: Walking, house work Relieving factors: Heat/ice   PRECAUTIONS: None  WEIGHT BEARING RESTRICTIONS No  FALLS:  Has patient fallen in last 6 months? No  LIVING ENVIRONMENT: Lives with: lives with their spouse Lives in: House/apartment Stairs: Yes: External: 2 steps; on right going up Has following  equipment at home: None  OCCUPATION: Retired  PLOF:  Independent  PATIENT GOALS Decrease pain   OBJECTIVE:    PATIENT SURVEYS:  FOTO 26; predicted 41  MUSCLE LENGTH: Hamstrings: Right ~80 deg; Left ~70 deg (from eval)  POSTURE:  R trunk lean, L iliac crest higher than R, R weight shift   (from eval)  PALPATION: Hypomobile with L ASIS PA mobs, TTP bilat glutes L>R, L QL, bilat piriformis. L PSIS PA mobs causes R hip spasm (from eval)  LUMBAR ROM:   Active  A/PROM  eval  Flexion 75% Feels pull  Extension 50% Increases pain  Right lateral flexion To knee feels pain  Left lateral flexion 3 inches above knee feels pull  Right rotation WFL  Left rotation Feels pull on back and L groin but WFL   (Blank rows = not tested)  LOWER EXTREMITY MMT  MMT Right eval Left eval  Hip flexion 3 3  Hip extension 3 3  Hip abduction 3+ 3+  Hip adduction    Hip internal rotation    Hip external rotation    Knee flexion 4 3+  Knee extension 4 4  Ankle dorsiflexion    Ankle plantarflexion    Ankle inversion    Ankle eversion     (Blank rows = not tested)    LUMBAR SPECIAL TESTS:  Straight leg raise test: L LE feels it in groin, Slump test: Positive, Single leg stance test: Positive, and FABER test: Positive on L; anteriorly tilted L inominate  FUNCTIONAL TESTS:  SLS: 5 sec on R, 3 sec on L ([place] noted bilat) (from eval)  16 sec on L, 8 sec on R (10/17)  5x STS 17 sec (10/17)     TODAY'S TREATMENT  09/20/22: THEREX Treadmill 1.1 mph x 5 min   Sitting Hip flexor stretch x30 sec R&L Sit<>stand 2x5; 5# 2x5  Supine Piriformis stretch x2 min on L, x30 sec on R  Prone Hip ext 2x10 R&L  Standing Side stepping red TB 3x10' Backwards monster walk red TB 2x10' SLS 2x20 sec  MANUAL THERAPY STM & TPR L glute med, max, min   09/15/22: THEREX Treadmill 1.1 mph x 5 min   Standing Counter plank at plinth 2x20 sec Counter side plank at plinth 2x20 sec Fire hydrants UE support on counter 2x10 [employer] kick with  UE support on counter 2x10 Palloff press green TB 2x10x3 sec Shoulder ext reactive iso green TB x10  Sitting Sit<>stand x10 hands on knees with hip hinge     09/13/22:  THEREX Seated Figure 4 stretch 2x30 sec bilat Hamstring stretch 2x30 sec bilat Hip flexor stretch 2x30 sec bilat Sit<>stand 2x5 with 5#  Standing Lateral hip shift 10x3 sec L only Wall squat 2x10  Supine SLR 2x10 on L&R Bridge with ball squeeze 2x10 Clamshell green TB 2x10  Quadruped Child's pose x30 sec; x30 sec with lateral flexion each side Hip ext 2x10      PATIENT EDUCATION:  Education details: Discussed exam findings, POC, and initial HEP Person educated: Patient Education method: Explanation, Demonstration, and Handouts Education comprehension: verbalized understanding, returned demonstration, and needs further education   HOME EXERCISE PROGRAM: Access Code: [email or web address] URL: [email or web address] Date: 08/22/2022 Prepared by: Estill Bamberg April Thurnell Garbe  Exercises - Supine Butterfly Groin Stretch  - 1 x daily - 7 x weekly - 2 sets - 30 sec hold - Supine Piriformis Stretch with Foot on Ground  - 1 x daily - 7 x weekly -  2 sets - 30 sec hold - Seated Hip Flexor Stretch  - 1 x daily - 7 x weekly - 2 sets - 30 sec hold - Right Standing Lateral Shift Correction at Wall - Repetitions  - 1 x daily - 7 x weekly - 3 sets - 10 sec hold  ASSESSMENT:  CLINICAL IMPRESSION: Session focused on re-checking pt's STGs. Pt has been slowly meeting STGs and making good progress towards LTGs. Improving pelvic symmetry. Continued to work on increasing hip strength. Tolerating 5# with STS with less pain. Provided manual work to address continued L glute tightness.    OBJECTIVE IMPAIRMENTS Abnormal gait, decreased activity tolerance, decreased mobility, difficulty walking, decreased ROM, decreased strength, hypomobility, increased fascial restrictions, increased muscle spasms, impaired flexibility,  improper body mechanics, postural dysfunction, and pain.   ACTIVITY LIMITATIONS lifting, bending, standing, sleeping, stairs, transfers, bed mobility, and locomotion level  PARTICIPATION LIMITATIONS: cleaning, laundry, shopping, and community activity  PERSONAL FACTORS Past/current experiences and Time since onset of injury/illness/exacerbation are also affecting patient's functional outcome.   REHAB POTENTIAL: Good  CLINICAL DECISION MAKING: Evolving/moderate complexity  EVALUATION COMPLEXITY: Moderate   GOALS: Goals reviewed with patient? Yes  SHORT TERM GOALS: Target date: 09/19/2022  Pt will be ind with initial HEP Baseline: Goal status: IN PROGRESS  2.  Pt will demo improved L pelvic mobility during spring testing to feel equal with R Baseline:  Goal status: MET  3.  Pt will be able to demo at least 5 sec on L SLS to demo improving LE stability Baseline: 16 sec on L Goal status: MET  4.  Pt will be able to tolerate performing 5x STS without UEs with equal weight shift with pain </=5/10 to demo improving L LE functional strength Baseline: 0/10; 17 sec Goal status: MET    LONG TERM GOALS: Target date: 10/17/2022  Pt will be ind with maintaining community and home wellness exercise program Baseline:  Goal status: INITIAL  2.  Pt will be able to demo SLS for at least 10 sec to demo improved bilat LE stability in standing Baseline:  Goal status: INITIAL  3.  Pt will demo increased at least 4/5 bilat LE strength Baseline:  Goal status: INITIAL  4.  Pt will demo L=R lateral lumbar flexion AROM for improved trunk posture and symmetry for standing balance Baseline:  Goal status: INITIAL  5.  Pt will have increased FOTO score to 41 Baseline: 26 Goal status: INITIAL     PLAN: PT FREQUENCY: 2x/week  PT DURATION: 8 weeks  PLANNED INTERVENTIONS: Therapeutic exercises, Therapeutic activity, Neuromuscular re-education, Balance training, Gait training,  Patient/Family education, Self Care, Joint mobilization, Aquatic Therapy, Dry Needling, Electrical stimulation, Spinal mobilization, Cryotherapy, Moist heat, Taping, Ionotophoresis 93m/ml Dexamethasone, Manual therapy, and Re-evaluation.  PLAN FOR NEXT SESSION: Assess response to HEP. Manual therapy as indicated. Correct sacrum/pelvis alignment. Continue balance/stability   [facility]pril Ma L Taryn Shellhammer, PT, DPT 09/20/2022, 10:58 AM

## 2022-09-22 ENCOUNTER — Ambulatory Visit: Payer: Medicare HMO | Admitting: Physical Therapy

## 2022-09-22 ENCOUNTER — Encounter: Payer: Self-pay | Admitting: Physical Therapy

## 2022-09-22 DIAGNOSIS — R29898 Other symptoms and signs involving the musculoskeletal system: Secondary | ICD-10-CM | POA: Diagnosis not present

## 2022-09-22 DIAGNOSIS — G8929 Other chronic pain: Secondary | ICD-10-CM

## 2022-09-22 DIAGNOSIS — M6281 Muscle weakness (generalized): Secondary | ICD-10-CM | POA: Diagnosis not present

## 2022-09-22 DIAGNOSIS — M5442 Lumbago with sciatica, left side: Secondary | ICD-10-CM | POA: Diagnosis not present

## 2022-09-22 DIAGNOSIS — M545 Low back pain, unspecified: Secondary | ICD-10-CM | POA: Diagnosis not present

## 2022-09-22 NOTE — Therapy (Signed)
OUTPATIENT PHYSICAL THERAPY TREATMENT   Patient Name: Grethel Zenk MRN: [id number] DOB:May 29, 1951, 71 y.o., female Today's Date: 09/22/2022   PT End of Session - 09/22/22 1014     Visit Number 8    Number of Visits 16    Date for PT Re-Evaluation 10/17/22    Authorization Type [employer]    Authorization - Visit Number 7    Authorization - Number of Visits 16    Progress Note Due on Visit 10    PT Start Time 1014    PT Stop Time 1055    PT Time Calculation (min) 41 min    Activity Tolerance Patient tolerated treatment well    Behavior During Therapy WFL for tasks assessed/performed              Past Medical History:  Diagnosis Date   Allergy 1974   Anxiety 2021   COPD (chronic obstructive pulmonary disease) ([place])    per pt no issues, chest CT in epic 07-29-2021   DDD (degenerative disc disease), cervical    DDD (degenerative disc disease), lumbar    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    Depression    2021   Facial asymmetry, acquired 1974   left side paralysis   GERD (gastroesophageal reflux disease) 2021   H/O: Bell's palsy 1974   per pt during pregnancy , left side paralysis, told possible bell's palsy or stroke, after testing done had left mastoidectomy;  residual left side facialy asymmetry   History of basal cell carcinoma (BCC) excision    History of cerebral aneurysm 2010   s/p coiling  ;  per pt was an incidental finding on imaging   History of Clostridioides difficile infection 2010   treated   History of COVID-19 05/08/2021   positive result in care everywhere; per pt mild to moderate symptoms that resolved   History of Helicobacter pylori infection 2010   treated   Hyperlipidemia    Hypertension    followed by pcp   Lymphocytic colitis    followed by dr c. Shary Key (GI);  dx by biopsy 09-13-2017   Macular degeneration of both eyes    Non-intractable vomiting with nausea    followed by dr  Shary Key   OA (osteoarthritis)    Osteoporosis    PONV (postoperative nausea and vomiting)    Spondylosis, unspecified    cervical and lumbar   Wears hearing aid in both ears    Past Surgical History:  Procedure Laterality Date   CARPAL TUNNEL RELEASE Bilateral    early 2000s   CATARACT EXTRACTION W/ INTRAOCULAR LENS IMPLANT Bilateral 2014   CEREBRAL ANEURYSM REPAIR  2010   endocerebral coiling   COLONOSCOPY  09/13/2017   COSMETIC SURGERY     [facility] on nose   DISTAL INTERPHALANGEAL JOINT FUSION Left 09/16/2021   Procedure: left small finger distal interphalangeal joint fusion;  Surgeon: [name], MD;  Location: [place];  Service: Orthopedics;  Laterality: Left;  with MAC anesthesia   ESOPHAGOGASTRODUODENOSCOPY  09/22/2020   EYE SURGERY  2020   Blepharoplasty   FOOT SURGERY Bilateral    eary 2000s;  bilateral bunionectomy  and removal bone right 5th toe   FRACTURE SURGERY  1968   Shattered right ankle   MASTOIDECTOMY Left 1974   RADIOFREQUENCY ABLATION  09/01/2021   lumbar , left side   ROTATOR CUFF REPAIR Right 2014   SALPINGOOPHORECTOMY Bilateral 1995   w/  excision endometriosis via laparotomy   SPINE SURGERY  2021/2022   DDD, osteoarthritis   TONSILLECTOMY AND ADENOIDECTOMY  1970   TOTAL ABDOMINAL HYSTERECTOMY  1987   TRIGGER FINGER RELEASE Bilateral    early 2000s; one finger each hand   TUBAL LIGATION  1982   WRIST GANGLION EXCISION Left    2000s   Patient Active Problem List   Diagnosis Date Noted   Osteoporosis 11/05/2020   DDD (degenerative disc disease), cervical 07/06/2020   Facet arthritis of lumbar region 05/06/2020   Bilateral wrist pain 05/06/2020   Inflammatory arthritis 03/13/2020   Osteoarthritis of left little finger 07/31/2018   Acute bronchitis with COPD ([place]) 05/10/2018   Impacted cerumen of left ear 03/07/2018   History of left mastoidectomy 03/07/2018   Tympanosclerosis, left ear 03/07/2018   Cardiac risk  counseling 09/05/2017   Essential hypertension 09/05/2017   Mixed hyperlipidemia 09/05/2017   History of Bell's palsy 08/15/2017   History of osteoporosis 08/15/2017   History of colon polyps 08/15/2017   History of nonmelanoma skin cancer 08/15/2017   Brain aneurysm 08/15/2017   Bilateral hearing loss 08/15/2017   H/O hysterectomy for benign disease 08/15/2017    PCP: [name]  REFERRING PROVIDER: [name]   REFERRING DIAG: M51.16 (ICD-10-CM) - Intervertebral disc disorders with radiculopathy, lumbar region  Rationale for Evaluation and Treatment Rehabilitation  THERAPY DIAG:  Chronic bilateral low back pain with left-sided sciatica  Other symptoms and signs involving the musculoskeletal system  Muscle weakness (generalized)  Acute bilateral low back pain without sciatica  ONSET DATE: 2-3 years  SUBJECTIVE:                                                                                                                                                                                           SUBJECTIVE STATEMENT: Pt reports some increased soreness from cleaning out the closet. Pt states it did feel better to have some of the manual work after last session.   PERTINENT HISTORY:  Has had PT in the past; history of multiple lumbar injections, ablations and blocks  PAIN:  Are you having pain? Yes: NPRS scale: currently 6/10 Pain location: Left low back and comes across front of thigh into leg Pain description: Throbbing pain, as it works to front of the hip it gets more painful Aggravating factors: Walking, house work Relieving factors: Heat/ice   PRECAUTIONS: None  WEIGHT BEARING RESTRICTIONS No  FALLS:  Has patient fallen in last 6 months? No  LIVING ENVIRONMENT: Lives with: lives with their spouse Lives in: House/apartment Stairs: Yes: External: 2 steps; on right going up Has following equipment  at home: None  OCCUPATION: Retired  PLOF:  Independent  PATIENT GOALS Decrease pain   OBJECTIVE:    PATIENT SURVEYS:  FOTO 26; predicted 41  MUSCLE LENGTH: Hamstrings: Right ~80 deg; Left ~70 deg (from eval)  POSTURE:  R trunk lean, L iliac crest higher than R, R weight shift   (from eval)  PALPATION: Hypomobile with L ASIS PA mobs, TTP bilat glutes L>R, L QL, bilat piriformis. L PSIS PA mobs causes R hip spasm (from eval)  LUMBAR ROM:   Active  A/PROM  eval  Flexion 75% Feels pull  Extension 50% Increases pain  Right lateral flexion To knee feels pain  Left lateral flexion 3 inches above knee feels pull  Right rotation WFL  Left rotation Feels pull on back and L groin but WFL   (Blank rows = not tested)  LOWER EXTREMITY MMT  MMT Right eval Left eval  Hip flexion 3 3  Hip extension 3 3  Hip abduction 3+ 3+  Hip adduction    Hip internal rotation    Hip external rotation    Knee flexion 4 3+  Knee extension 4 4  Ankle dorsiflexion    Ankle plantarflexion    Ankle inversion    Ankle eversion     (Blank rows = not tested)    LUMBAR SPECIAL TESTS:  Straight leg raise test: L LE feels it in groin, Slump test: Positive, Single leg stance test: Positive, and FABER test: Positive on L; anteriorly tilted L inominate  FUNCTIONAL TESTS:  SLS: 5 sec on R, 3 sec on L ([place] noted bilat) (from eval)  16 sec on L, 8 sec on R (10/17)  5x STS 17 sec (10/17)     TODAY'S TREATMENT  09/22/22: THEREX Treadmill 1.1 mph x 5 min Sit<>stand 2x10 with 8# Palloff press red TB 2x10  Sidelying Clamshell red TB 2x10  Supine Piriformis stretch 2x30 sec Bridge SL 2x10  Quadruped Hip ext red TB 2x10  MANUAL THERAPY STM & TPR L glute med, max, min   09/20/22: THEREX Treadmill 1.1 mph x 5 min   Sitting Hip flexor stretch x30 sec R&L Sit<>stand 2x5; 5# 2x5  Supine Piriformis stretch x2 min on L, x30 sec on R  Prone Hip ext 2x10 R&L  Standing Side stepping red TB 3x10' Backwards monster  walk red TB 2x10' SLS 2x20 sec  MANUAL THERAPY STM & TPR L glute med, max, min   09/15/22: THEREX Treadmill 1.1 mph x 5 min   Standing Counter plank at plinth 2x20 sec Counter side plank at plinth 2x20 sec Fire hydrants UE support on counter 2x10 [employer] kick with UE support on counter 2x10 Palloff press green TB 2x10x3 sec Shoulder ext reactive iso green TB x10  Sitting Sit<>stand x10 hands on knees with hip hinge     09/13/22:  THEREX Seated Figure 4 stretch 2x30 sec bilat Hamstring stretch 2x30 sec bilat Hip flexor stretch 2x30 sec bilat Sit<>stand 2x5 with 5#  Standing Lateral hip shift 10x3 sec L only Wall squat 2x10  Supine SLR 2x10 on L&R Bridge with ball squeeze 2x10 Clamshell green TB 2x10  Quadruped Child's pose x30 sec; x30 sec with lateral flexion each side Hip ext 2x10      PATIENT EDUCATION:  Education details: Discussed exam findings, POC, and initial HEP Person educated: Patient Education method: Explanation, Demonstration, and Handouts Education comprehension: verbalized understanding, returned demonstration, and needs further education   HOME EXERCISE PROGRAM: Access Code: [email or web address]  URL: [email or web address] Date: 08/22/2022 Prepared by: Estill Bamberg April Thurnell Garbe  Exercises - Supine Butterfly Groin Stretch  - 1 x daily - 7 x weekly - 2 sets - 30 sec hold - Supine Piriformis Stretch with Foot on Ground  - 1 x daily - 7 x weekly - 2 sets - 30 sec hold - Seated Hip Flexor Stretch  - 1 x daily - 7 x weekly - 2 sets - 30 sec hold - Right Standing Lateral Shift Correction at Wall - Repetitions  - 1 x daily - 7 x weekly - 3 sets - 10 sec hold  ASSESSMENT:  CLINICAL IMPRESSION: Pt continues to progress well with her strengthening exercises. Able to progress to 8# for sit to stand and tolerated single leg bridges. Continued to work on hip and core strengthening.    OBJECTIVE IMPAIRMENTS Abnormal gait, decreased activity  tolerance, decreased mobility, difficulty walking, decreased ROM, decreased strength, hypomobility, increased fascial restrictions, increased muscle spasms, impaired flexibility, improper body mechanics, postural dysfunction, and pain.   ACTIVITY LIMITATIONS lifting, bending, standing, sleeping, stairs, transfers, bed mobility, and locomotion level  PARTICIPATION LIMITATIONS: cleaning, laundry, shopping, and community activity  PERSONAL FACTORS Past/current experiences and Time since onset of injury/illness/exacerbation are also affecting patient's functional outcome.   REHAB POTENTIAL: Good  CLINICAL DECISION MAKING: Evolving/moderate complexity  EVALUATION COMPLEXITY: Moderate   GOALS: Goals reviewed with patient? Yes  SHORT TERM GOALS: Target date: 09/19/2022  Pt will be ind with initial HEP Baseline: Goal status: IN PROGRESS  2.  Pt will demo improved L pelvic mobility during spring testing to feel equal with R Baseline:  Goal status: MET  3.  Pt will be able to demo at least 5 sec on L SLS to demo improving LE stability Baseline: 16 sec on L Goal status: MET  4.  Pt will be able to tolerate performing 5x STS without UEs with equal weight shift with pain </=5/10 to demo improving L LE functional strength Baseline: 0/10; 17 sec Goal status: MET    LONG TERM GOALS: Target date: 10/17/2022  Pt will be ind with maintaining community and home wellness exercise program Baseline:  Goal status: INITIAL  2.  Pt will be able to demo SLS for at least 10 sec to demo improved bilat LE stability in standing Baseline:  Goal status: INITIAL  3.  Pt will demo increased at least 4/5 bilat LE strength Baseline:  Goal status: INITIAL  4.  Pt will demo L=R lateral lumbar flexion AROM for improved trunk posture and symmetry for standing balance Baseline:  Goal status: INITIAL  5.  Pt will have increased FOTO score to 41 Baseline: 26 Goal status: INITIAL     PLAN: PT  FREQUENCY: 2x/week  PT DURATION: 8 weeks  PLANNED INTERVENTIONS: Therapeutic exercises, Therapeutic activity, Neuromuscular re-education, Balance training, Gait training, Patient/Family education, Self Care, Joint mobilization, Aquatic Therapy, Dry Needling, Electrical stimulation, Spinal mobilization, Cryotherapy, Moist heat, Taping, Ionotophoresis 18m/ml Dexamethasone, Manual therapy, and Re-evaluation.  PLAN FOR NEXT SESSION: Assess response to HEP. Manual therapy as indicated. Correct sacrum/pelvis alignment. Continue balance/stability   [facility] Ma L Chitara Clonch, PT, DPT 09/22/2022, 10:14 AM

## 2022-09-27 ENCOUNTER — Encounter: Payer: Self-pay | Admitting: Physical Therapy

## 2022-09-27 ENCOUNTER — Ambulatory Visit: Payer: Medicare HMO | Admitting: Physical Therapy

## 2022-09-27 DIAGNOSIS — M545 Low back pain, unspecified: Secondary | ICD-10-CM

## 2022-09-27 DIAGNOSIS — M6281 Muscle weakness (generalized): Secondary | ICD-10-CM | POA: Diagnosis not present

## 2022-09-27 DIAGNOSIS — G8929 Other chronic pain: Secondary | ICD-10-CM

## 2022-09-27 DIAGNOSIS — R29898 Other symptoms and signs involving the musculoskeletal system: Secondary | ICD-10-CM

## 2022-09-27 DIAGNOSIS — M5442 Lumbago with sciatica, left side: Secondary | ICD-10-CM | POA: Diagnosis not present

## 2022-09-27 NOTE — Therapy (Signed)
OUTPATIENT PHYSICAL THERAPY TREATMENT   Patient Name: Krista [name] MRN: [id number] DOB:1950-12-16, 71 y.o., female Today's Date: 09/27/2022   PT End of Session - 09/27/22 1014     Visit Number 9    Number of Visits 16    Date for PT Re-Evaluation 10/17/22    Authorization Type [employer]    Authorization - Visit Number 8    Authorization - Number of Visits 16    Progress Note Due on Visit 10    PT Start Time 7169    PT Stop Time 1055    PT Time Calculation (min) 40 min    Activity Tolerance Patient tolerated treatment well    Behavior During Therapy WFL for tasks assessed/performed              Past Medical History:  Diagnosis Date   Allergy 1974   Anxiety 2021   COPD (chronic obstructive pulmonary disease) ([place])    per pt no issues, chest CT in epic 07-29-2021   DDD (degenerative disc disease), cervical    DDD (degenerative disc disease), lumbar    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    Depression    2021   Facial asymmetry, acquired 1974   left side paralysis   GERD (gastroesophageal reflux disease) 2021   H/O: Bell's palsy 1974   per pt during pregnancy , left side paralysis, told possible bell's palsy or stroke, after testing done had left mastoidectomy;  residual left side facialy asymmetry   History of basal cell carcinoma (BCC) excision    History of cerebral aneurysm 2010   s/p coiling  ;  per pt was an incidental finding on imaging   History of Clostridioides difficile infection 2010   treated   History of COVID-19 05/08/2021   positive result in care everywhere; per pt mild to moderate symptoms that resolved   History of Helicobacter pylori infection 2010   treated   Hyperlipidemia    Hypertension    followed by pcp   Lymphocytic colitis    followed by dr c. Shary Key (GI);  dx by biopsy 09-13-2017   Macular degeneration of both eyes    Non-intractable vomiting with nausea    followed by dr  Shary Key   OA (osteoarthritis)    Osteoporosis    PONV (postoperative nausea and vomiting)    Spondylosis, unspecified    cervical and lumbar   Wears hearing aid in both ears    Past Surgical History:  Procedure Laterality Date   CARPAL TUNNEL RELEASE Bilateral    early 2000s   CATARACT EXTRACTION W/ INTRAOCULAR LENS IMPLANT Bilateral 2014   CEREBRAL ANEURYSM REPAIR  2010   endocerebral coiling   COLONOSCOPY  09/13/2017   COSMETIC SURGERY     [facility] Surgery on nose   DISTAL INTERPHALANGEAL JOINT FUSION Left 09/16/2021   Procedure: left small finger distal interphalangeal joint fusion;  Surgeon: [name], MD;  Location: [facility];  Service: Orthopedics;  Laterality: Left;  with MAC anesthesia   ESOPHAGOGASTRODUODENOSCOPY  09/22/2020   EYE SURGERY  2020   Blepharoplasty   FOOT SURGERY Bilateral    eary 2000s;  bilateral bunionectomy  and removal bone right 5th toe   FRACTURE SURGERY  1968   Shattered right ankle   MASTOIDECTOMY Left 1974   RADIOFREQUENCY ABLATION  09/01/2021   lumbar , left side   ROTATOR CUFF REPAIR Right 2014   SALPINGOOPHORECTOMY Bilateral 1995   w/  excision endometriosis via laparotomy   SPINE SURGERY  2021/2022   DDD, osteoarthritis   TONSILLECTOMY AND ADENOIDECTOMY  1970   TOTAL ABDOMINAL HYSTERECTOMY  1987   TRIGGER FINGER RELEASE Bilateral    early 2000s; one finger each hand   TUBAL LIGATION  1982   WRIST GANGLION EXCISION Left    2000s   Patient Active Problem List   Diagnosis Date Noted   Osteoporosis 11/05/2020   DDD (degenerative disc disease), cervical 07/06/2020   Facet arthritis of lumbar region 05/06/2020   Bilateral wrist pain 05/06/2020   Inflammatory arthritis 03/13/2020   Osteoarthritis of left little finger 07/31/2018   Acute bronchitis with COPD ([place]) 05/10/2018   Impacted cerumen of left ear 03/07/2018   History of left mastoidectomy 03/07/2018   Tympanosclerosis, left ear 03/07/2018   Cardiac risk  counseling 09/05/2017   Essential hypertension 09/05/2017   Mixed hyperlipidemia 09/05/2017   History of Bell's palsy 08/15/2017   History of osteoporosis 08/15/2017   History of colon polyps 08/15/2017   History of nonmelanoma skin cancer 08/15/2017   Brain aneurysm 08/15/2017   Bilateral hearing loss 08/15/2017   H/O hysterectomy for benign disease 08/15/2017    PCP: [name]  REFERRING PROVIDER: [name]   REFERRING DIAG: M51.16 (ICD-10-CM) - Intervertebral disc disorders with radiculopathy, lumbar region  Rationale for Evaluation and Treatment Rehabilitation  THERAPY DIAG:  Chronic bilateral low back pain with left-sided sciatica  Other symptoms and signs involving the musculoskeletal system  Muscle weakness (generalized)  Acute bilateral low back pain without sciatica  ONSET DATE: 2-3 years  SUBJECTIVE:                                                                                                                                                                                           SUBJECTIVE STATEMENT: Pt reports she is doing alright. [facility] Pt reports feeling it less in the front of the thigh -- more across the low back bilat (L>R). Notes that the low back starts to happen after ~2000 steps and feels that her foot starts to turn inwards.  PERTINENT HISTORY:  Has had PT in the past; history of multiple lumbar injections, ablations and blocks  PAIN:  Are you having pain? Yes: NPRS scale: currently 6/10 Pain location: Left low back and comes across front of thigh into leg Pain description: Throbbing pain, as it works to front of the hip it gets more painful Aggravating factors: Walking, house work Relieving factors: Heat/ice   PRECAUTIONS: None  WEIGHT BEARING RESTRICTIONS No  FALLS:  Has patient fallen in last 6  months? No  LIVING ENVIRONMENT: Lives with: lives with their spouse Lives in: House/apartment Stairs:  Yes: External: 2 steps; on right going up Has following equipment at home: None  OCCUPATION: Retired  PLOF: Independent  PATIENT GOALS Decrease pain   OBJECTIVE:    PATIENT SURVEYS:  FOTO 26; predicted 41  MUSCLE LENGTH: Hamstrings: Right ~80 deg; Left ~70 deg (from eval)  POSTURE:  R trunk lean, L iliac crest higher than R, R weight shift   (from eval)  PALPATION: Hypomobile with L ASIS PA mobs, TTP bilat glutes L>R, L QL, bilat piriformis. L PSIS PA mobs causes R hip spasm (from eval)  LUMBAR ROM:   Active  A/PROM  eval  Flexion 75% Feels pull  Extension 50% Increases pain  Right lateral flexion To knee feels pain  Left lateral flexion 3 inches above knee feels pull  Right rotation WFL  Left rotation Feels pull on back and L groin but WFL   (Blank rows = not tested)  LOWER EXTREMITY MMT  MMT Right eval Left eval  Hip flexion 3 3  Hip extension 3 3  Hip abduction 3+ 3+  Hip adduction    Hip internal rotation    Hip external rotation    Knee flexion 4 3+  Knee extension 4 4  Ankle dorsiflexion    Ankle plantarflexion    Ankle inversion    Ankle eversion     (Blank rows = not tested)    LUMBAR SPECIAL TESTS:  Straight leg raise test: L LE feels it in groin, Slump test: Positive, Single leg stance test: Positive, and FABER test: Positive on L; anteriorly tilted L inominate  FUNCTIONAL TESTS:  SLS: 5 sec on R, 3 sec on L ([place] noted bilat) (from eval)  16 sec on L, 8 sec on R (10/17)  5x STS 17 sec (10/17)     TODAY'S TREATMENT  09/27/22: THEREX Treadmill 1.2 mph x 4 min  Sitting Hamstring stretch 2x30 sec Piriformis stretch 2x30 sec  Quadruped Hip ext x15  Sidelying Clamshell red TB x10  Supine Self MET to improve L ant inominate 5x5 sec  MANUAL THERAPY PA mobs PSIS into ant rotation on L STM & TPR bilat glutes and lumbar paraspinals LAD L hip Ant inominate rotation on L   09/22/22: THEREX Treadmill 1.1 mph x 5  min Sit<>stand 2x10 with 8# Palloff press red TB 2x10  Sidelying Clamshell red TB 2x10  Supine Piriformis stretch 2x30 sec Bridge SL 2x10  Quadruped Hip ext red TB 2x10  MANUAL THERAPY STM & TPR L glute med, max, min   09/20/22: THEREX Treadmill 1.1 mph x 5 min   Sitting Hip flexor stretch x30 sec R&L Sit<>stand 2x5; 5# 2x5  Supine Piriformis stretch x2 min on L, x30 sec on R  Prone Hip ext 2x10 R&L  Standing Side stepping red TB 3x10' Backwards monster walk red TB 2x10' SLS 2x20 sec  MANUAL THERAPY STM & TPR L glute med, max, min   PATIENT EDUCATION:  Education details: Discussed exam findings, POC, and initial HEP Person educated: Patient Education method: Explanation, Demonstration, and Handouts Education comprehension: verbalized understanding, returned demonstration, and needs further education   HOME EXERCISE PROGRAM: Access Code: [email or web address] URL: [email or web address] Date: 09/27/2022 Prepared by: Estill Bamberg April Thurnell Garbe  Exercises - Supine Butterfly Groin Stretch  - 1 x daily - 7 x weekly - 2 sets - 30 sec hold - Supine Piriformis Stretch with Foot on Ground  - 1  x daily - 7 x weekly - 2 sets - 30 sec hold - Seated Hip Flexor Stretch  - 1 x daily - 7 x weekly - 2 sets - 30 sec hold - Right Standing Lateral Shift Correction at Wall - Repetitions  - 1 x daily - 7 x weekly - 3 sets - 10 sec hold - Sit to Stand  - 1 x daily - 7 x weekly - 2 sets - 10 reps - Standing Anti-Rotation Press with Anchored Resistance  - 1 x daily - 7 x weekly - 2 sets - 10 reps - 3 sec hold - Side Stepping with Resistance at Ankles and Counter Support  - 1 x daily - 7 x weekly - 2 sets - 10 reps - Single Leg Bridge  - 1 x daily - 7 x weekly - 2 sets - 10 reps - Clamshell with Resistance  - 1 x daily - 7 x weekly - 1-2 sets - 10 reps - Beginner Front Arm Support  - 1 x daily - 7 x weekly - 2 sets - 10 reps  ASSESSMENT:  CLINICAL IMPRESSION: Session focused  on L hip/pelvic stability and strength. Noted increased L hip weight shift and lumbar rotation with quadruped exercise. Went over proper form with clamshells.    OBJECTIVE IMPAIRMENTS Abnormal gait, decreased activity tolerance, decreased mobility, difficulty walking, decreased ROM, decreased strength, hypomobility, increased fascial restrictions, increased muscle spasms, impaired flexibility, improper body mechanics, postural dysfunction, and pain.   ACTIVITY LIMITATIONS lifting, bending, standing, sleeping, stairs, transfers, bed mobility, and locomotion level  PARTICIPATION LIMITATIONS: cleaning, laundry, shopping, and community activity  PERSONAL FACTORS Past/current experiences and Time since onset of injury/illness/exacerbation are also affecting patient's functional outcome.   REHAB POTENTIAL: Good  CLINICAL DECISION MAKING: Evolving/moderate complexity  EVALUATION COMPLEXITY: Moderate   GOALS: Goals reviewed with patient? Yes  SHORT TERM GOALS: Target date: 09/19/2022  Pt will be ind with initial HEP Baseline: Goal status: IN PROGRESS  2.  Pt will demo improved L pelvic mobility during spring testing to feel equal with R Baseline:  Goal status: MET  3.  Pt will be able to demo at least 5 sec on L SLS to demo improving LE stability Baseline: 16 sec on L Goal status: MET  4.  Pt will be able to tolerate performing 5x STS without UEs with equal weight shift with pain </=5/10 to demo improving L LE functional strength Baseline: 0/10; 17 sec Goal status: MET    LONG TERM GOALS: Target date: 10/17/2022  Pt will be ind with maintaining community and home wellness exercise program Baseline:  Goal status: INITIAL  2.  Pt will be able to demo SLS for at least 10 sec to demo improved bilat LE stability in standing Baseline:  Goal status: INITIAL  3.  Pt will demo increased at least 4/5 bilat LE strength Baseline:  Goal status: INITIAL  4.  Pt will demo L=R lateral  lumbar flexion AROM for improved trunk posture and symmetry for standing balance Baseline:  Goal status: INITIAL  5.  Pt will have increased FOTO score to 41 Baseline: 26 Goal status: INITIAL     PLAN: PT FREQUENCY: 2x/week  PT DURATION: 8 weeks  PLANNED INTERVENTIONS: Therapeutic exercises, Therapeutic activity, Neuromuscular re-education, Balance training, Gait training, Patient/Family education, Self Care, Joint mobilization, Aquatic Therapy, Dry Needling, Electrical stimulation, Spinal mobilization, Cryotherapy, Moist heat, Taping, Ionotophoresis 50m/ml Dexamethasone, Manual therapy, and Re-evaluation.  PLAN FOR NEXT SESSION: Assess response to HEP.  Manual therapy as indicated. Correct sacrum/pelvis alignment. Continue balance/stability   [facility] April Ma L Rosabelle Jupin, PT, DPT 09/27/2022, 10:14 AM

## 2022-09-29 ENCOUNTER — Encounter: Payer: Self-pay | Admitting: Physical Therapy

## 2022-09-29 ENCOUNTER — Ambulatory Visit: Payer: Medicare HMO | Admitting: Physical Therapy

## 2022-09-29 DIAGNOSIS — H02432 Paralytic ptosis of left eyelid: Secondary | ICD-10-CM | POA: Diagnosis not present

## 2022-09-29 DIAGNOSIS — M5442 Lumbago with sciatica, left side: Secondary | ICD-10-CM | POA: Diagnosis not present

## 2022-09-29 DIAGNOSIS — H04122 Dry eye syndrome of left lacrimal gland: Secondary | ICD-10-CM | POA: Diagnosis not present

## 2022-09-29 DIAGNOSIS — H524 Presbyopia: Secondary | ICD-10-CM | POA: Diagnosis not present

## 2022-09-29 DIAGNOSIS — H353131 Nonexudative age-related macular degeneration, bilateral, early dry stage: Secondary | ICD-10-CM | POA: Diagnosis not present

## 2022-09-29 DIAGNOSIS — M6281 Muscle weakness (generalized): Secondary | ICD-10-CM

## 2022-09-29 DIAGNOSIS — Z961 Presence of intraocular lens: Secondary | ICD-10-CM | POA: Diagnosis not present

## 2022-09-29 DIAGNOSIS — G8929 Other chronic pain: Secondary | ICD-10-CM | POA: Diagnosis not present

## 2022-09-29 DIAGNOSIS — M545 Low back pain, unspecified: Secondary | ICD-10-CM

## 2022-09-29 DIAGNOSIS — R29898 Other symptoms and signs involving the musculoskeletal system: Secondary | ICD-10-CM | POA: Diagnosis not present

## 2022-09-29 NOTE — Therapy (Signed)
OUTPATIENT PHYSICAL THERAPY TREATMENT   Patient Name: Tanvi Gatling MRN: [id number] DOB:[birth year], 71 y.o., female Today's Date: 09/29/2022   PT End of Session - 09/29/22 1447     Visit Number 10    Number of Visits 16    Date for PT Re-Evaluation 10/17/22    Authorization Type [employer]    Authorization - Visit Number 9    Authorization - Number of Visits 16    Progress Note Due on Visit 10    PT Start Time 7681    PT Stop Time 1572    PT Time Calculation (min) 45 min    Activity Tolerance Patient tolerated treatment well    Behavior During Therapy WFL for tasks assessed/performed               Past Medical History:  Diagnosis Date   Allergy 1974   Anxiety 2021   COPD (chronic obstructive pulmonary disease) ([place])    per pt no issues, chest CT in epic 07-29-2021   DDD (degenerative disc disease), cervical    DDD (degenerative disc disease), lumbar    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    Depression    2021   Facial asymmetry, acquired 1974   left side paralysis   GERD (gastroesophageal reflux disease) 2021   H/O: Bell's palsy 1974   per pt during pregnancy , left side paralysis, told possible bell's palsy or stroke, after testing done had left mastoidectomy;  residual left side facialy asymmetry   History of basal cell carcinoma (BCC) excision    History of cerebral aneurysm 2010   s/p coiling  ;  per pt was an incidental finding on imaging   History of Clostridioides difficile infection 2010   treated   History of COVID-19 05/08/2021   positive result in care everywhere; per pt mild to moderate symptoms that resolved   History of Helicobacter pylori infection 2010   treated   Hyperlipidemia    Hypertension    followed by pcp   Lymphocytic colitis    followed by dr c. Shary Key (GI);  dx by biopsy 09-13-2017   Macular degeneration of both eyes    Non-intractable vomiting with nausea    followed by dr  Shary Key   OA (osteoarthritis)    Osteoporosis    PONV (postoperative nausea and vomiting)    Spondylosis, unspecified    cervical and lumbar   Wears hearing aid in both ears    Past Surgical History:  Procedure Laterality Date   CARPAL TUNNEL RELEASE Bilateral    early 2000s   CATARACT EXTRACTION W/ INTRAOCULAR LENS IMPLANT Bilateral 2014   CEREBRAL ANEURYSM REPAIR  2010   endocerebral coiling   COLONOSCOPY  09/13/2017   COSMETIC SURGERY     [facility] Surgery on nose   DISTAL INTERPHALANGEAL JOINT FUSION Left 09/16/2021   Procedure: left small finger distal interphalangeal joint fusion;  Surgeon: [name], MD;  Location: [facility];  Service: Orthopedics;  Laterality: Left;  with MAC anesthesia   ESOPHAGOGASTRODUODENOSCOPY  09/22/2020   EYE SURGERY  2020   Blepharoplasty   FOOT SURGERY Bilateral    eary 2000s;  bilateral bunionectomy  and removal bone right 5th toe   FRACTURE SURGERY  1968   Shattered right ankle   MASTOIDECTOMY Left 1974   RADIOFREQUENCY ABLATION  09/01/2021   lumbar , left side   ROTATOR CUFF REPAIR Right 2014   SALPINGOOPHORECTOMY Bilateral 1995  w/ excision endometriosis via laparotomy   SPINE SURGERY  2021/2022   DDD, osteoarthritis   TONSILLECTOMY AND ADENOIDECTOMY  1970   TOTAL ABDOMINAL HYSTERECTOMY  1987   TRIGGER FINGER RELEASE Bilateral    early 2000s; one finger each hand   TUBAL LIGATION  1982   WRIST GANGLION EXCISION Left    2000s   Patient Active Problem List   Diagnosis Date Noted   Osteoporosis 11/05/2020   DDD (degenerative disc disease), cervical 07/06/2020   Facet arthritis of lumbar region 05/06/2020   Bilateral wrist pain 05/06/2020   Inflammatory arthritis 03/13/2020   Osteoarthritis of left little finger 07/31/2018   Acute bronchitis with COPD ([place]) 05/10/2018   Impacted cerumen of left ear 03/07/2018   History of left mastoidectomy 03/07/2018   Tympanosclerosis, left ear 03/07/2018   Cardiac risk  counseling 09/05/2017   Essential hypertension 09/05/2017   Mixed hyperlipidemia 09/05/2017   History of Bell's palsy 08/15/2017   History of osteoporosis 08/15/2017   History of colon polyps 08/15/2017   History of nonmelanoma skin cancer 08/15/2017   Brain aneurysm 08/15/2017   Bilateral hearing loss 08/15/2017   H/O hysterectomy for benign disease 08/15/2017    PCP: [name]  REFERRING PROVIDER: [name]   REFERRING DIAG: M51.16 (ICD-10-CM) - Intervertebral disc disorders with radiculopathy, lumbar region  Rationale for Evaluation and Treatment Rehabilitation  THERAPY DIAG:  Chronic bilateral low back pain with left-sided sciatica  Other symptoms and signs involving the musculoskeletal system  Muscle weakness (generalized)  Acute bilateral low back pain without sciatica  ONSET DATE: 2-3 years  SUBJECTIVE:                                                                                                                                                                                           SUBJECTIVE STATEMENT: Pt reports nothing new to report. She'll be going to the [employer]. Has been doing her exercises twice a day. Reports difficulty with sidelying clamshell.    PERTINENT HISTORY:  Has had PT in the past; history of multiple lumbar injections, ablations and blocks  PAIN:  Are you having pain? Yes: NPRS scale: currently 6/10 Pain location: Left low back and comes across front of thigh into leg Pain description: Throbbing pain, as it works to front of the hip it gets more painful Aggravating factors: Walking, house work Relieving factors: Heat/ice   PRECAUTIONS: None  WEIGHT BEARING RESTRICTIONS No  FALLS:  Has patient fallen in last 6 months? No  LIVING ENVIRONMENT: Lives with: lives with their spouse Lives in: House/apartment Stairs: Yes: External: 2 steps; on right going up Has  following equipment at home: None  OCCUPATION:  Retired  PLOF: Independent  PATIENT GOALS Decrease pain   OBJECTIVE:    PATIENT SURVEYS:  FOTO 26; predicted 41  MUSCLE LENGTH: Hamstrings: Right ~80 deg; Left ~70 deg (from eval)  POSTURE:  R trunk lean, L iliac crest higher than R, R weight shift   (from eval)  PALPATION: Hypomobile with L ASIS PA mobs, TTP bilat glutes L>R, L QL, bilat piriformis. L PSIS PA mobs causes R hip spasm (from eval)  LUMBAR ROM:   Active  A/PROM  eval  Flexion 75% Feels pull  Extension 50% Increases pain  Right lateral flexion To knee feels pain  Left lateral flexion 3 inches above knee feels pull  Right rotation WFL  Left rotation Feels pull on back and L groin but WFL   (Blank rows = not tested)  LOWER EXTREMITY MMT  MMT Right eval Left eval  Hip flexion 3 3  Hip extension 3 3  Hip abduction 3+ 3+  Hip adduction    Hip internal rotation    Hip external rotation    Knee flexion 4 3+  Knee extension 4 4  Ankle dorsiflexion    Ankle plantarflexion    Ankle inversion    Ankle eversion     (Blank rows = not tested)    LUMBAR SPECIAL TESTS:  Straight leg raise test: L LE feels it in groin, Slump test: Positive, Single leg stance test: Positive, and FABER test: Positive on L; anteriorly tilted L inominate  FUNCTIONAL TESTS:  SLS: 5 sec on R, 3 sec on L ([place] noted bilat) (from eval)  16 sec on L, 8 sec on R (10/17)  5x STS 17 sec (10/17)     TODAY'S TREATMENT  09/29/22 THEREX Treadmill 1.3 mph x 5 min  Sidelying Clamshell yellow TB 2x10  Sitting Hip IR yellow TB x10 Hip ER yellow TB x10 Hamstring stretch x30 sec Figure 4 stretch x30 sec Lateral flexion stretch x 30 sec  Supine Single leg bridge x10  Standing Side step red TB 3x10' Palloff press doubled red TB 2x10 Shoulder ext red TB 2x10 Reactive row iso red TB x10  MANUAL THERAPY STM & TPR bilat glutes L pelvic posterior rotation   09/27/22: THEREX Treadmill 1.2 mph x 4  min  Sitting Hamstring stretch 2x30 sec Piriformis stretch 2x30 sec  Quadruped Hip ext x15  Sidelying Clamshell red TB x10  Supine Self MET to improve L ant inominate 5x5 sec  MANUAL THERAPY PA mobs PSIS into ant rotation on L STM & TPR bilat glutes and lumbar paraspinals LAD L hip Ant inominate rotation on L   09/22/22: THEREX Treadmill 1.1 mph x 5 min Sit<>stand 2x10 with 8# Palloff press red TB 2x10  Sidelying Clamshell red TB 2x10  Supine Piriformis stretch 2x30 sec Bridge SL 2x10  Quadruped Hip ext red TB 2x10  MANUAL THERAPY STM & TPR L glute med, max, min  PATIENT EDUCATION:  Education details: Discussed exam findings, POC, and initial HEP Person educated: Patient Education method: Consulting civil engineer, Demonstration, and Handouts Education comprehension: verbalized understanding, returned demonstration, and needs further education   HOME EXERCISE PROGRAM: Access Code: [email or web address] URL: [email or web address] Date: 09/27/2022 Prepared by: Estill Bamberg April Thurnell Garbe  Exercises - Supine Butterfly Groin Stretch  - 1 x daily - 7 x weekly - 2 sets - 30 sec hold - Supine Piriformis Stretch with Foot on Ground  - 1 x daily - 7 x weekly - 2  sets - 30 sec hold - Seated Hip Flexor Stretch  - 1 x daily - 7 x weekly - 2 sets - 30 sec hold - Right Standing Lateral Shift Correction at Wall - Repetitions  - 1 x daily - 7 x weekly - 3 sets - 10 sec hold - Sit to Stand  - 1 x daily - 7 x weekly - 2 sets - 10 reps - Standing Anti-Rotation Press with Anchored Resistance  - 1 x daily - 7 x weekly - 2 sets - 10 reps - 3 sec hold - Side Stepping with Resistance at Ankles and Counter Support  - 1 x daily - 7 x weekly - 2 sets - 10 reps - Single Leg Bridge  - 1 x daily - 7 x weekly - 2 sets - 10 reps - Clamshell with Resistance  - 1 x daily - 7 x weekly - 1-2 sets - 10 reps - Beginner Front Arm Support  - 1 x daily - 7 x weekly - 2 sets - 10  reps  ASSESSMENT:  CLINICAL IMPRESSION: Decreased resistance on clamshell -- red TB pulling too much in pelvis. Continued hip strengthening and core/trunk stability.   OBJECTIVE IMPAIRMENTS Abnormal gait, decreased activity tolerance, decreased mobility, difficulty walking, decreased ROM, decreased strength, hypomobility, increased fascial restrictions, increased muscle spasms, impaired flexibility, improper body mechanics, postural dysfunction, and pain.   ACTIVITY LIMITATIONS lifting, bending, standing, sleeping, stairs, transfers, bed mobility, and locomotion level  PARTICIPATION LIMITATIONS: cleaning, laundry, shopping, and community activity  PERSONAL FACTORS Past/current experiences and Time since onset of injury/illness/exacerbation are also affecting patient's functional outcome.   REHAB POTENTIAL: Good  CLINICAL DECISION MAKING: Evolving/moderate complexity  EVALUATION COMPLEXITY: Moderate   GOALS: Goals reviewed with patient? Yes  SHORT TERM GOALS: Target date: 09/19/2022  Pt will be ind with initial HEP Baseline: Goal status: IN PROGRESS  2.  Pt will demo improved L pelvic mobility during spring testing to feel equal with R Baseline:  Goal status: MET  3.  Pt will be able to demo at least 5 sec on L SLS to demo improving LE stability Baseline: 16 sec on L Goal status: MET  4.  Pt will be able to tolerate performing 5x STS without UEs with equal weight shift with pain </=5/10 to demo improving L LE functional strength Baseline: 0/10; 17 sec Goal status: MET    LONG TERM GOALS: Target date: 10/17/2022  Pt will be ind with maintaining community and home wellness exercise program Baseline:  Goal status: INITIAL  2.  Pt will be able to demo SLS for at least 10 sec to demo improved bilat LE stability in standing Baseline:  Goal status: INITIAL  3.  Pt will demo increased at least 4/5 bilat LE strength Baseline:  Goal status: INITIAL  4.  Pt will demo  L=R lateral lumbar flexion AROM for improved trunk posture and symmetry for standing balance Baseline:  Goal status: INITIAL  5.  Pt will have increased FOTO score to 41 Baseline: 26 Goal status: INITIAL     PLAN: PT FREQUENCY: 2x/week  PT DURATION: 8 weeks  PLANNED INTERVENTIONS: Therapeutic exercises, Therapeutic activity, Neuromuscular re-education, Balance training, Gait training, Patient/Family education, Self Care, Joint mobilization, Aquatic Therapy, Dry Needling, Electrical stimulation, Spinal mobilization, Cryotherapy, Moist heat, Taping, Ionotophoresis 54m/ml Dexamethasone, Manual therapy, and Re-evaluation.  PLAN FOR NEXT SESSION: Assess response to HEP. Manual therapy as indicated. Correct sacrum/pelvis alignment. Continue balance/stability   Johara Lodwick April Ma LSherley Bounds PT,  DPT 09/29/2022, 2:47 PM

## 2022-10-04 ENCOUNTER — Encounter: Payer: Self-pay | Admitting: Physical Therapy

## 2022-10-04 ENCOUNTER — Ambulatory Visit: Payer: Medicare HMO | Admitting: Physical Therapy

## 2022-10-04 DIAGNOSIS — M6281 Muscle weakness (generalized): Secondary | ICD-10-CM | POA: Diagnosis not present

## 2022-10-04 DIAGNOSIS — G8929 Other chronic pain: Secondary | ICD-10-CM

## 2022-10-04 DIAGNOSIS — R29898 Other symptoms and signs involving the musculoskeletal system: Secondary | ICD-10-CM

## 2022-10-04 DIAGNOSIS — M545 Low back pain, unspecified: Secondary | ICD-10-CM | POA: Diagnosis not present

## 2022-10-04 DIAGNOSIS — M5442 Lumbago with sciatica, left side: Secondary | ICD-10-CM | POA: Diagnosis not present

## 2022-10-04 NOTE — Therapy (Signed)
OUTPATIENT PHYSICAL THERAPY TREATMENT   Patient Name: Krista [name] MRN: [id number] DOB:1951/01/21, 71 y.o., female Today's Date: 10/04/2022   PT End of Session - 10/04/22 1059     Visit Number 11    Number of Visits 16    Date for PT Re-Evaluation 10/17/22    Authorization Type [employer]    Authorization - Visit Number 10    Authorization - Number of Visits 16    Progress Note Due on Visit 10    PT Start Time 1059    PT Stop Time 1140    PT Time Calculation (min) 41 min    Activity Tolerance Patient tolerated treatment well    Behavior During Therapy WFL for tasks assessed/performed                Past Medical History:  Diagnosis Date   Allergy 1974   Anxiety 2021   COPD (chronic obstructive pulmonary disease) (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    Depression    2021   Facial asymmetry, acquired 1974   left side paralysis   GERD (gastroesophageal reflux disease) 2021   H/O: Bell's palsy 1974   per pt during pregnancy , left side paralysis, told possible bell's palsy or stroke, after testing done had left mastoidectomy;  residual left side facialy asymmetry   History of basal cell carcinoma (BCC) excision    History of cerebral aneurysm 2010   s/p coiling  ;  per pt was an incidental finding on imaging   History of Clostridioides difficile infection 2010   treated   History of COVID-19 05/08/2021   positive result in care everywhere; per pt mild to moderate symptoms that resolved   History of Helicobacter pylori infection 2010   treated   Hyperlipidemia    Hypertension    followed by pcp   Lymphocytic colitis    followed by dr c. Shary Key (GI);  dx by biopsy 09-13-2017   Macular degeneration of both eyes    Non-intractable vomiting with nausea    followed by  dr Shary Key   OA (osteoarthritis)    Osteoporosis    PONV (postoperative nausea and vomiting)    Spondylosis, unspecified    cervical and lumbar   Wears hearing aid in both ears    Past Surgical History:  Procedure Laterality Date   CARPAL TUNNEL RELEASE Bilateral    early 2000s   CATARACT EXTRACTION W/ INTRAOCULAR LENS IMPLANT Bilateral 2014   CEREBRAL ANEURYSM REPAIR  2010   endocerebral coiling   COLONOSCOPY  09/13/2017   COSMETIC SURGERY     [facility] Surgery on nose   DISTAL INTERPHALANGEAL JOINT FUSION Left 09/16/2021   Procedure: left small finger distal interphalangeal joint fusion;  Surgeon: [name], MD;  Location: [facility];  Service: Orthopedics;  Laterality: Left;  with MAC anesthesia   ESOPHAGOGASTRODUODENOSCOPY  09/22/2020   EYE SURGERY  2020   Blepharoplasty   FOOT SURGERY Bilateral    eary 2000s;  bilateral bunionectomy  and removal bone right 5th toe   FRACTURE SURGERY  1968   Shattered right ankle   MASTOIDECTOMY Left 1974   RADIOFREQUENCY ABLATION  09/01/2021   lumbar , left side   ROTATOR CUFF REPAIR Right 2014   SALPINGOOPHORECTOMY Bilateral 1995  w/ excision endometriosis via laparotomy   SPINE SURGERY  2021/2022   DDD, osteoarthritis   TONSILLECTOMY AND ADENOIDECTOMY  1970   TOTAL ABDOMINAL HYSTERECTOMY  1987   TRIGGER FINGER RELEASE Bilateral    early 2000s; one finger each hand   TUBAL LIGATION  1982   WRIST GANGLION EXCISION Left    2000s   Patient Active Problem List   Diagnosis Date Noted   Osteoporosis 11/05/2020   DDD (degenerative disc disease), cervical 07/06/2020   Facet arthritis of lumbar region 05/06/2020   Bilateral wrist pain 05/06/2020   Inflammatory arthritis 03/13/2020   Osteoarthritis of left little finger 07/31/2018   Acute bronchitis with COPD ([place]) 05/10/2018   Impacted cerumen of left ear 03/07/2018   History of left mastoidectomy 03/07/2018   Tympanosclerosis, left ear 03/07/2018   Cardiac risk  counseling 09/05/2017   Essential hypertension 09/05/2017   Mixed hyperlipidemia 09/05/2017   History of Bell's palsy 08/15/2017   History of osteoporosis 08/15/2017   History of colon polyps 08/15/2017   History of nonmelanoma skin cancer 08/15/2017   Brain aneurysm 08/15/2017   Bilateral hearing loss 08/15/2017   H/O hysterectomy for benign disease 08/15/2017    PCP: [name]  REFERRING PROVIDER: [name]   REFERRING DIAG: M51.16 (ICD-10-CM) - Intervertebral disc disorders with radiculopathy, lumbar region  Rationale for Evaluation and Treatment Rehabilitation  THERAPY DIAG:  Chronic bilateral low back pain with left-sided sciatica  Other symptoms and signs involving the musculoskeletal system  Muscle weakness (generalized)  Acute bilateral low back pain without sciatica  ONSET DATE: 2-3 years  SUBJECTIVE:                                                                                                                                                                                           SUBJECTIVE STATEMENT: Pt states she's been dizzy and short of breath the last few days.   PERTINENT HISTORY:  Has had PT in the past; history of multiple lumbar injections, ablations and blocks  PAIN:  Are you having pain? Yes: NPRS scale: currently 6/10 Pain location: Left low back and comes across front of thigh into leg Pain description: Throbbing pain, as it works to front of the hip it gets more painful Aggravating factors: Walking, house work Relieving factors: Heat/ice   PRECAUTIONS: None  WEIGHT BEARING RESTRICTIONS No  FALLS:  Has patient fallen in last 6 months? No  LIVING ENVIRONMENT: Lives with: lives with their spouse Lives in: House/apartment Stairs: Yes: External: 2 steps; on right going up Has following equipment at home: None  OCCUPATION: Retired  PLOF: [place]  Decrease pain   OBJECTIVE:   VITALS 10/31: BP  128/74; HR 88; spO2 98%  PATIENT SURVEYS:  FOTO 26; predicted 41  MUSCLE LENGTH: Hamstrings: Right ~80 deg; Left ~70 deg (from eval)  POSTURE:  R trunk lean, L iliac crest higher than R, R weight shift   (from eval)  PALPATION: Hypomobile with L ASIS PA mobs, TTP bilat glutes L>R, L QL, bilat piriformis. L PSIS PA mobs causes R hip spasm (from eval)  LUMBAR ROM:   Active  A/PROM  eval  Flexion 75% Feels pull  Extension 50% Increases pain  Right lateral flexion To knee feels pain  Left lateral flexion 3 inches above knee feels pull  Right rotation WFL  Left rotation Feels pull on back and L groin but WFL   (Blank rows = not tested)  LOWER EXTREMITY MMT  MMT Right eval Left eval  Hip flexion 3 3  Hip extension 3 3  Hip abduction 3+ 3+  Hip adduction    Hip internal rotation    Hip external rotation    Knee flexion 4 3+  Knee extension 4 4  Ankle dorsiflexion    Ankle plantarflexion    Ankle inversion    Ankle eversion     (Blank rows = not tested)    LUMBAR SPECIAL TESTS:  Straight leg raise test: L LE feels it in groin, Slump test: Positive, Single leg stance test: Positive, and FABER test: Positive on L; anteriorly tilted L inominate  FUNCTIONAL TESTS:  SLS:  5 sec on R, 3 sec on L ([place] noted bilat) (from eval)  8 sec on R, 16 sec on L, (10/17)  5x STS 17 sec (10/17)     TODAY'S TREATMENT  10/04/22 THEREX Treadmill 1.3 mph x 5 min  Sitting Hamstring stretch x30 sec Figure 4 stretch x 30 sec Hip IR yellow TB 2x10 Hip ER yellow TB 2x10  Quadruped Bird dog 2 x10 Fire hydrant 2 x10  Standing QL stretch at door 2x30 sec Pball press 10x5 sec Captain morgan 10 x5 sec   09/29/22 THEREX Treadmill 1.3 mph x 5 min  Sidelying Clamshell yellow TB 2x10  Sitting Hip IR yellow TB x10 Hip ER yellow TB x10 Hamstring stretch x30 sec Figure 4 stretch x30 sec Lateral flexion stretch x 30 sec  Supine Single leg bridge  x10  Standing Side step red TB 3x10' Palloff press doubled red TB 2x10 Shoulder ext red TB 2x10 Reactive row iso red TB x10  MANUAL THERAPY STM & TPR bilat glutes L pelvic posterior rotation   09/27/22: THEREX Treadmill 1.2 mph x 4 min  Sitting Hamstring stretch 2x30 sec Piriformis stretch 2x30 sec  Quadruped Hip ext x15  Sidelying Clamshell red TB x10  Supine Self MET to improve L ant inominate 5x5 sec  MANUAL THERAPY PA mobs PSIS into ant rotation on L STM & TPR bilat glutes and lumbar paraspinals LAD L hip Ant inominate rotation on L    PATIENT EDUCATION:  Education details: Discussed exam findings, POC, and initial HEP Person educated: Patient Education method: Explanation, Demonstration, and Handouts Education comprehension: verbalized understanding, returned demonstration, and needs further education   HOME EXERCISE PROGRAM: Access Code: [email or web address] URL: [email or web address] Date: 09/27/2022 Prepared by: Estill Bamberg April Thurnell Garbe  Exercises - Supine Butterfly Groin Stretch  - 1 x daily - 7 x weekly - 2 sets - 30 sec hold - Supine Piriformis Stretch with Foot on Ground  - 1 x daily - 7  x weekly - 2 sets - 30 sec hold - Seated Hip Flexor Stretch  - 1 x daily - 7 x weekly - 2 sets - 30 sec hold - Right Standing Lateral Shift Correction at Wall - Repetitions  - 1 x daily - 7 x weekly - 3 sets - 10 sec hold - Sit to Stand  - 1 x daily - 7 x weekly - 2 sets - 10 reps - Standing Anti-Rotation Press with Anchored Resistance  - 1 x daily - 7 x weekly - 2 sets - 10 reps - 3 sec hold - Side Stepping with Resistance at Ankles and Counter Support  - 1 x daily - 7 x weekly - 2 sets - 10 reps - Single Leg Bridge  - 1 x daily - 7 x weekly - 2 sets - 10 reps - Clamshell with Resistance  - 1 x daily - 7 x weekly - 1-2 sets - 10 reps - Beginner Front Arm Support  - 1 x daily - 7 x weekly - 2 sets - 10 reps  ASSESSMENT:  CLINICAL IMPRESSION: Able to  progress pt's strengthening. Continued pain and tenderness in pt's glute med. Not able to tolerate red TB for clamshell yet.   OBJECTIVE IMPAIRMENTS Abnormal gait, decreased activity tolerance, decreased mobility, difficulty walking, decreased ROM, decreased strength, hypomobility, increased fascial restrictions, increased muscle spasms, impaired flexibility, improper body mechanics, postural dysfunction, and pain.   ACTIVITY LIMITATIONS lifting, bending, standing, sleeping, stairs, transfers, bed mobility, and locomotion level  PARTICIPATION LIMITATIONS: cleaning, laundry, shopping, and community activity  PERSONAL FACTORS Past/current experiences and Time since onset of injury/illness/exacerbation are also affecting patient's functional outcome.   REHAB POTENTIAL: Good  CLINICAL DECISION MAKING: Evolving/moderate complexity  EVALUATION COMPLEXITY: Moderate   GOALS: Goals reviewed with patient? Yes  SHORT TERM GOALS: Target date: 09/19/2022  Pt will be ind with initial HEP Baseline: Goal status: IN PROGRESS  2.  Pt will demo improved L pelvic mobility during spring testing to feel equal with R Baseline:  Goal status: MET  3.  Pt will be able to demo at least 5 sec on L SLS to demo improving LE stability Baseline: 16 sec on L Goal status: MET  4.  Pt will be able to tolerate performing 5x STS without UEs with equal weight shift with pain </=5/10 to demo improving L LE functional strength Baseline: 0/10; 17 sec Goal status: MET    LONG TERM GOALS: Target date: 10/17/2022  Pt will be ind with maintaining community and home wellness exercise program Baseline:  Goal status: IN PROGRESS  2.  Pt will be able to demo SLS for at least 10 sec to demo improved bilat LE stability in standing Baseline:  Goal status: IN PROGRESS  3.  Pt will demo increased at least 4/5 bilat LE strength Baseline:  Goal status: IN PROGRESS  4.  Pt will demo L=R lateral lumbar flexion AROM for  improved trunk posture and symmetry for standing balance Baseline:  Goal status: IN PROGRESS  5.  Pt will have increased FOTO score to 41 Baseline: 26 Goal status: IN PROGRESS     PLAN: PT FREQUENCY: 2x/week  PT DURATION: 8 weeks  PLANNED INTERVENTIONS: Therapeutic exercises, Therapeutic activity, Neuromuscular re-education, Balance training, Gait training, Patient/Family education, Self Care, Joint mobilization, Aquatic Therapy, Dry Needling, Electrical stimulation, Spinal mobilization, Cryotherapy, Moist heat, Taping, Ionotophoresis 53m/ml Dexamethasone, Manual therapy, and Re-evaluation.  PLAN FOR NEXT SESSION: Assess response to HEP. Manual therapy as indicated.  Correct sacrum/pelvis alignment. Continue balance/stability   Harlon Kutner April Ma L Arion Shankles, PT, DPT 10/04/2022, 11:00 AM

## 2022-10-06 ENCOUNTER — Ambulatory Visit: Payer: Medicare HMO | Attending: Neurosurgery | Admitting: Physical Therapy

## 2022-10-06 ENCOUNTER — Encounter: Payer: Self-pay | Admitting: Physical Therapy

## 2022-10-06 DIAGNOSIS — M6281 Muscle weakness (generalized): Secondary | ICD-10-CM | POA: Diagnosis not present

## 2022-10-06 DIAGNOSIS — R29898 Other symptoms and signs involving the musculoskeletal system: Secondary | ICD-10-CM | POA: Insufficient documentation

## 2022-10-06 DIAGNOSIS — M545 Low back pain, unspecified: Secondary | ICD-10-CM | POA: Diagnosis not present

## 2022-10-06 DIAGNOSIS — M5442 Lumbago with sciatica, left side: Secondary | ICD-10-CM | POA: Insufficient documentation

## 2022-10-06 DIAGNOSIS — G8929 Other chronic pain: Secondary | ICD-10-CM | POA: Insufficient documentation

## 2022-10-06 NOTE — Therapy (Signed)
OUTPATIENT PHYSICAL THERAPY TREATMENT   Patient Name: Krista [name] MRN: [id number] DOB:[birth year], 71 y.o., female Today's Date: 10/06/2022   PT End of Session - 10/06/22 1101     Visit Number 12    Number of Visits 16    Date for PT Re-Evaluation 10/17/22    Authorization Type [employer]    Authorization - Visit Number 11    Authorization - Number of Visits 16    Progress Note Due on Visit 10    PT Start Time 1101    PT Stop Time 1145    PT Time Calculation (min) 44 min    Activity Tolerance Patient tolerated treatment well    Behavior During Therapy WFL for tasks assessed/performed                Past Medical History:  Diagnosis Date   Allergy 1974   Anxiety 2021   COPD (chronic obstructive pulmonary disease) ([place])    per pt no issues, chest CT in epic 07-29-2021   DDD (degenerative disc disease), cervical    DDD (degenerative disc disease), lumbar    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    Depression    2021   Facial asymmetry, acquired 1974   left side paralysis   GERD (gastroesophageal reflux disease) 2021   H/O: Bell's palsy 1974   per pt during pregnancy , left side paralysis, told possible bell's palsy or stroke, after testing done had left mastoidectomy;  residual left side facialy asymmetry   History of basal cell carcinoma (BCC) excision    History of cerebral aneurysm 2010   s/p coiling  ;  per pt was an incidental finding on imaging   History of Clostridioides difficile infection 2010   treated   History of COVID-19 05/08/2021   positive result in care everywhere; per pt mild to moderate symptoms that resolved   History of Helicobacter pylori infection 2010   treated   Hyperlipidemia    Hypertension    followed by pcp   Lymphocytic colitis    followed by dr c. Shary Key (GI);  dx by biopsy 09-13-2017   Macular degeneration of both eyes    Non-intractable vomiting with nausea    followed by  dr Shary Key   OA (osteoarthritis)    Osteoporosis    PONV (postoperative nausea and vomiting)    Spondylosis, unspecified    cervical and lumbar   Wears hearing aid in both ears    Past Surgical History:  Procedure Laterality Date   CARPAL TUNNEL RELEASE Bilateral    early 2000s   CATARACT EXTRACTION W/ INTRAOCULAR LENS IMPLANT Bilateral 2014   CEREBRAL ANEURYSM REPAIR  2010   endocerebral coiling   COLONOSCOPY  09/13/2017   COSMETIC SURGERY     [facility] Surgery on nose   DISTAL INTERPHALANGEAL JOINT FUSION Left 09/16/2021   Procedure: left small finger distal interphalangeal joint fusion;  Surgeon: [name], MD;  Location: [facility];  Service: Orthopedics;  Laterality: Left;  with MAC anesthesia   ESOPHAGOGASTRODUODENOSCOPY  09/22/2020   EYE SURGERY  2020   Blepharoplasty   FOOT SURGERY Bilateral    eary 2000s;  bilateral bunionectomy  and removal bone right 5th toe   FRACTURE SURGERY  1968   Shattered right ankle   MASTOIDECTOMY Left 1974   RADIOFREQUENCY ABLATION  09/01/2021   lumbar , left side   ROTATOR CUFF REPAIR Right 2014   SALPINGOOPHORECTOMY Bilateral 1995  w/ excision endometriosis via laparotomy   SPINE SURGERY  2021/2022   DDD, osteoarthritis   TONSILLECTOMY AND ADENOIDECTOMY  1970   TOTAL ABDOMINAL HYSTERECTOMY  1987   TRIGGER FINGER RELEASE Bilateral    early 2000s; one finger each hand   TUBAL LIGATION  1982   WRIST GANGLION EXCISION Left    2000s   Patient Active Problem List   Diagnosis Date Noted   Osteoporosis 11/05/2020   DDD (degenerative disc disease), cervical 07/06/2020   Facet arthritis of lumbar region 05/06/2020   Bilateral wrist pain 05/06/2020   Inflammatory arthritis 03/13/2020   Osteoarthritis of left little finger 07/31/2018   Acute bronchitis with COPD ([place]) 05/10/2018   Impacted cerumen of left ear 03/07/2018   History of left mastoidectomy 03/07/2018   Tympanosclerosis, left ear 03/07/2018   Cardiac risk  counseling 09/05/2017   Essential hypertension 09/05/2017   Mixed hyperlipidemia 09/05/2017   History of Bell's palsy 08/15/2017   History of osteoporosis 08/15/2017   History of colon polyps 08/15/2017   History of nonmelanoma skin cancer 08/15/2017   Brain aneurysm 08/15/2017   Bilateral hearing loss 08/15/2017   H/O hysterectomy for benign disease 08/15/2017    PCP: [name]  REFERRING PROVIDER: [name]   REFERRING DIAG: M51.16 (ICD-10-CM) - Intervertebral disc disorders with radiculopathy, lumbar region  Rationale for Evaluation and Treatment Rehabilitation  THERAPY DIAG:  Chronic bilateral low back pain with left-sided sciatica  Other symptoms and signs involving the musculoskeletal system  Muscle weakness (generalized)  Acute bilateral low back pain without sciatica  ONSET DATE: 2-3 years  SUBJECTIVE:                                                                                                                                                                                           SUBJECTIVE STATEMENT: Pt reports increased soreness today. Pt states she walked a lot after last PT treatment session trick or treating with her grandchildren.   PERTINENT HISTORY:  Has had PT in the past; history of multiple lumbar injections, ablations and blocks  PAIN:  Are you having pain? Yes: NPRS scale: currently 6/10 Pain location: Left low back Pain description: Throbbing pain, as it works to front of the hip it gets more painful Aggravating factors: Walking, house work Relieving factors: Heat/ice   PRECAUTIONS: None  WEIGHT BEARING RESTRICTIONS No  FALLS:  Has patient fallen in last 6 months? No  LIVING ENVIRONMENT: Lives with: lives with their spouse Lives in: House/apartment Stairs: Yes: External: 2 steps; on right going up Has following equipment at home: None  OCCUPATION: Retired  PLOF: Independent  PATIENT  GOALS Decrease  pain   OBJECTIVE:   VITALS 10/31: BP 128/74; HR 88; spO2 98%  PATIENT SURVEYS:  FOTO 26; predicted 41  MUSCLE LENGTH: Hamstrings: Right ~80 deg; Left ~70 deg (from eval)  POSTURE:  R trunk lean, L iliac crest higher than R, R weight shift   (from eval)  PALPATION: Hypomobile with L ASIS PA mobs, TTP bilat glutes L>R, L QL, bilat piriformis. L PSIS PA mobs causes R hip spasm (from eval)  LUMBAR ROM:   Active  A/PROM  eval  Flexion 75% Feels pull  Extension 50% Increases pain  Right lateral flexion To knee feels pain  Left lateral flexion 3 inches above knee feels pull  Right rotation WFL  Left rotation Feels pull on back and L groin but WFL   (Blank rows = not tested)  LOWER EXTREMITY MMT  MMT Right eval Left eval  Hip flexion 3 3  Hip extension 3 3  Hip abduction 3+ 3+  Hip adduction    Hip internal rotation    Hip external rotation    Knee flexion 4 3+  Knee extension 4 4  Ankle dorsiflexion    Ankle plantarflexion    Ankle inversion    Ankle eversion     (Blank rows = not tested)    LUMBAR SPECIAL TESTS:  Straight leg raise test: L LE feels it in groin, Slump test: Positive, Single leg stance test: Positive, and FABER test: Positive on L; anteriorly tilted L inominate  FUNCTIONAL TESTS:  SLS:  5 sec on R, 3 sec on L ([place] noted bilat) (from eval)  8 sec on R, 16 sec on L, (10/17)  5x STS 17 sec (10/17)     TODAY'S TREATMENT  10/06/22 THEREX Treadmill 1.4 mph x 34mn  Standing Lateral hip shift 2x30 sec  Sitting Figure 4 stretch 2x30 sec Piriformis 4 stretch 2x30 sec On pball pelvic tilts x10 On pball hip flexion x10 On pball LAQ x10  Supine Butterfly stretch 2x30 sec Pelvic tilts x10  Sidelying Clamshell with hip IR x10 on R  MANUAL THERAPY Moist heat with STM and TPR L glute med, QL, and lumbar paraspinals   10/04/22 THEREX Treadmill 1.3 mph x 5 min  Sitting Hamstring stretch x30 sec Figure 4 stretch x 30  sec Hip IR yellow TB 2x10 Hip ER yellow TB 2x10  Quadruped Bird dog 2 x10 Fire hydrant 2 x10  Standing QL stretch at door 2x30 sec Pball press 10x5 sec Captain morgan 10 x5 sec   09/29/22 THEREX Treadmill 1.3 mph x 5 min  Sidelying Clamshell yellow TB 2x10  Sitting Hip IR yellow TB x10 Hip ER yellow TB x10 Hamstring stretch x30 sec Figure 4 stretch x30 sec Lateral flexion stretch x 30 sec  Supine Single leg bridge x10  Standing Side step red TB 3x10' Palloff press doubled red TB 2x10 Shoulder ext red TB 2x10 Reactive row iso red TB x10  MANUAL THERAPY STM & TPR bilat glutes L pelvic posterior rotation      PATIENT EDUCATION:  Education details: Discussed exam findings, POC, and initial HEP Person educated: Patient Education method: Explanation, Demonstration, and Handouts Education comprehension: verbalized understanding, returned demonstration, and needs further education   HOME EXERCISE PROGRAM: Access Code: [email or web address] URL: [email or web address] Date: 09/27/2022 Prepared by: GEstill BambergApril MThurnell Garbe Exercises - Supine Butterfly Groin Stretch  - 1 x daily - 7 x weekly - 2 sets - 30 sec hold - Supine Piriformis Stretch with  Foot on Ground  - 1 x daily - 7 x weekly - 2 sets - 30 sec hold - Seated Hip Flexor Stretch  - 1 x daily - 7 x weekly - 2 sets - 30 sec hold - Right Standing Lateral Shift Correction at Wall - Repetitions  - 1 x daily - 7 x weekly - 3 sets - 10 sec hold - Sit to Stand  - 1 x daily - 7 x weekly - 2 sets - 10 reps - Standing Anti-Rotation Press with Anchored Resistance  - 1 x daily - 7 x weekly - 2 sets - 10 reps - 3 sec hold - Side Stepping with Resistance at Ankles and Counter Support  - 1 x daily - 7 x weekly - 2 sets - 10 reps - Single Leg Bridge  - 1 x daily - 7 x weekly - 2 sets - 10 reps - Clamshell with Resistance  - 1 x daily - 7 x weekly - 1-2 sets - 10 reps - Beginner Front Arm Support  - 1 x daily - 7 x  weekly - 2 sets - 10 reps  ASSESSMENT:  CLINICAL IMPRESSION: Treatment focused on gentle pelvic mobility, core strengthening and hip strengthening. Increased soreness in L glute/lower lumbar addressed with manual therapy.   OBJECTIVE IMPAIRMENTS Abnormal gait, decreased activity tolerance, decreased mobility, difficulty walking, decreased ROM, decreased strength, hypomobility, increased fascial restrictions, increased muscle spasms, impaired flexibility, improper body mechanics, postural dysfunction, and pain.   ACTIVITY LIMITATIONS lifting, bending, standing, sleeping, stairs, transfers, bed mobility, and locomotion level  PARTICIPATION LIMITATIONS: cleaning, laundry, shopping, and community activity  PERSONAL FACTORS Past/current experiences and Time since onset of injury/illness/exacerbation are also affecting patient's functional outcome.   REHAB POTENTIAL: Good  CLINICAL DECISION MAKING: Evolving/moderate complexity  EVALUATION COMPLEXITY: Moderate   GOALS: Goals reviewed with patient? Yes  SHORT TERM GOALS: Target date: 09/19/2022  Pt will be ind with initial HEP Baseline: Goal status: MET  2.  Pt will demo improved L pelvic mobility during spring testing to feel equal with R Baseline:  Goal status: MET  3.  Pt will be able to demo at least 5 sec on L SLS to demo improving LE stability Baseline: 16 sec on L Goal status: MET  4.  Pt will be able to tolerate performing 5x STS without UEs with equal weight shift with pain </=5/10 to demo improving L LE functional strength Baseline: 0/10; 17 sec Goal status: MET    LONG TERM GOALS: Target date: 10/17/2022  Pt will be ind with maintaining community and home wellness exercise program Baseline:  Goal status: IN PROGRESS  2.  Pt will be able to demo SLS for at least 10 sec to demo improved bilat LE stability in standing Baseline:  Goal status: IN PROGRESS  3.  Pt will demo increased at least 4/5 bilat LE  strength Baseline:  Goal status: IN PROGRESS  4.  Pt will demo L=R lateral lumbar flexion AROM for improved trunk posture and symmetry for standing balance Baseline:  Goal status: IN PROGRESS  5.  Pt will have increased FOTO score to 41 Baseline: 26 Goal status: IN PROGRESS     PLAN: PT FREQUENCY: 2x/week  PT DURATION: 8 weeks  PLANNED INTERVENTIONS: Therapeutic exercises, Therapeutic activity, Neuromuscular re-education, Balance training, Gait training, Patient/Family education, Self Care, Joint mobilization, Aquatic Therapy, Dry Needling, Electrical stimulation, Spinal mobilization, Cryotherapy, Moist heat, Taping, Ionotophoresis 37m/ml Dexamethasone, Manual therapy, and Re-evaluation.  PLAN FOR NEXT SESSION:  Assess response to HEP. Manual therapy as indicated. Correct sacrum/pelvis alignment. Continue balance/stability   [facility] April Ma L Yi Haugan, PT, DPT 10/06/2022, 11:31 AM

## 2022-10-10 NOTE — Therapy (Signed)
OUTPATIENT PHYSICAL THERAPY TREATMENT   Patient Name: Krista [name] MRN: [id number] DOB:1951-02-05, 71 y.o., female Today's Date: 10/11/2022   PT End of Session - 10/11/22 1103     Visit Number 13    Number of Visits 16    Date for PT Re-Evaluation 10/17/22    Authorization Type [employer]    Authorization - Visit Number 3    Authorization - Number of Visits 16    Progress Note Due on Visit 20    PT Start Time 1102    PT Stop Time 1143    PT Time Calculation (min) 41 min    Activity Tolerance Patient tolerated treatment well    Behavior During Therapy WFL for tasks assessed/performed                Past Medical History:  Diagnosis Date   Allergy 1974   Anxiety 2021   COPD (chronic obstructive pulmonary disease) ([place])    per pt no issues, chest CT in epic 07-29-2021   DDD (degenerative disc disease), cervical    DDD (degenerative disc disease), lumbar    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    Depression    2021   Facial asymmetry, acquired 1974   left side paralysis   GERD (gastroesophageal reflux disease) 2021   H/O: Bell's palsy 1974   per pt during pregnancy , left side paralysis, told possible bell's palsy or stroke, after testing done had left mastoidectomy;  residual left side facialy asymmetry   History of basal cell carcinoma (BCC) excision    History of cerebral aneurysm 2010   s/p coiling  ;  per pt was an incidental finding on imaging   History of Clostridioides difficile infection 2010   treated   History of COVID-19 05/08/2021   positive result in care everywhere; per pt mild to moderate symptoms that resolved   History of Helicobacter pylori infection 2010   treated   Hyperlipidemia    Hypertension    followed by pcp   Lymphocytic colitis    followed by dr c. Shary Key (GI);  dx by biopsy 09-13-2017   Macular degeneration of both eyes    Non-intractable vomiting with nausea    followed by  dr Shary Key   OA (osteoarthritis)    Osteoporosis    PONV (postoperative nausea and vomiting)    Spondylosis, unspecified    cervical and lumbar   Wears hearing aid in both ears    Past Surgical History:  Procedure Laterality Date   CARPAL TUNNEL RELEASE Bilateral    early 2000s   CATARACT EXTRACTION W/ INTRAOCULAR LENS IMPLANT Bilateral 2014   CEREBRAL ANEURYSM REPAIR  2010   endocerebral coiling   COLONOSCOPY  09/13/2017   COSMETIC SURGERY     [facility] Surgery on nose   DISTAL INTERPHALANGEAL JOINT FUSION Left 09/16/2021   Procedure: left small finger distal interphalangeal joint fusion;  Surgeon: [name], MD;  Location: [facility];  Service: Orthopedics;  Laterality: Left;  with MAC anesthesia   ESOPHAGOGASTRODUODENOSCOPY  09/22/2020   EYE SURGERY  2020   Blepharoplasty   FOOT SURGERY Bilateral    eary 2000s;  bilateral bunionectomy  and removal bone right 5th toe   FRACTURE SURGERY  1968   Shattered right ankle   MASTOIDECTOMY Left 1974   RADIOFREQUENCY ABLATION  09/01/2021   lumbar , left side   ROTATOR CUFF REPAIR Right 2014   SALPINGOOPHORECTOMY Bilateral 1995  w/ excision endometriosis via laparotomy   SPINE SURGERY  2021/2022   DDD, osteoarthritis   TONSILLECTOMY AND ADENOIDECTOMY  1970   TOTAL ABDOMINAL HYSTERECTOMY  1987   TRIGGER FINGER RELEASE Bilateral    early 2000s; one finger each hand   TUBAL LIGATION  1982   WRIST GANGLION EXCISION Left    2000s   Patient Active Problem List   Diagnosis Date Noted   Osteoporosis 11/05/2020   DDD (degenerative disc disease), cervical 07/06/2020   Facet arthritis of lumbar region 05/06/2020   Bilateral wrist pain 05/06/2020   Inflammatory arthritis 03/13/2020   Osteoarthritis of left little finger 07/31/2018   Acute bronchitis with COPD ([place]) 05/10/2018   Impacted cerumen of left ear 03/07/2018   History of left mastoidectomy 03/07/2018   Tympanosclerosis, left ear 03/07/2018   Cardiac risk  counseling 09/05/2017   Essential hypertension 09/05/2017   Mixed hyperlipidemia 09/05/2017   History of Bell's palsy 08/15/2017   History of osteoporosis 08/15/2017   History of colon polyps 08/15/2017   History of nonmelanoma skin cancer 08/15/2017   Brain aneurysm 08/15/2017   Bilateral hearing loss 08/15/2017   H/O hysterectomy for benign disease 08/15/2017    PCP: [name]  REFERRING PROVIDER: [name]   REFERRING DIAG: M51.16 (ICD-10-CM) - Intervertebral disc disorders with radiculopathy, lumbar region  Rationale for Evaluation and Treatment Rehabilitation  THERAPY DIAG:  Chronic bilateral low back pain with left-sided sciatica  Other symptoms and signs involving the musculoskeletal system  Muscle weakness (generalized)  Acute bilateral low back pain without sciatica  ONSET DATE: 2-3 years  SUBJECTIVE:                                                                                                                                                                                           SUBJECTIVE STATEMENT: Back still hurts but it is better. No pain into front of thigh.  PERTINENT HISTORY:  Has had PT in the past; history of multiple lumbar injections, ablations and blocks  PAIN:  Are you having pain? Yes: NPRS scale: currently 5/10 Pain location: Left low back Pain description: Throbbing pain, as it works to front of the hip it gets more painful Aggravating factors: Walking, house work Relieving factors: Heat/ice   PRECAUTIONS: None  WEIGHT BEARING RESTRICTIONS No  FALLS:  Has patient fallen in last 6 months? No  LIVING ENVIRONMENT: Lives with: lives with their spouse Lives in: House/apartment Stairs: Yes: External: 2 steps; on right going up Has following equipment at home: None  OCCUPATION: Retired  PLOF: Independent  PATIENT GOALS Decrease pain   OBJECTIVE:   VITALS 10/31:  BP 128/74; HR 88; spO2 98%  PATIENT SURVEYS:  FOTO  26; predicted 41  MUSCLE LENGTH: Hamstrings: Right ~80 deg; Left ~70 deg (from eval)  POSTURE:  R trunk lean, L iliac crest higher than R, R weight shift   (from eval)  PALPATION: Hypomobile with L ASIS PA mobs, TTP bilat glutes L>R, L QL, bilat piriformis. L PSIS PA mobs causes R hip spasm (from eval)  LUMBAR ROM:   Active  A/PROM  eval Active 10/11/22  Flexion 75% Feels pull   Extension 50% Increases pain   Right lateral flexion To knee feels pain   Left lateral flexion 3 inches above knee feels pull 1.5 inch above knee  Right rotation WFL   Left rotation Feels pull on back and L groin but WFL    (Blank rows = not tested)  LOWER EXTREMITY MMT  MMT Right eval Left eval Right  10/11/22 Left 10/11/22  Hip flexion _0 4+  Hip extension 3 3 4+ 4+  Hip abduction 3+ 3+ 5 5  Hip adduction      Hip internal rotation      Hip external rotation      Knee flexion 4 3+ 5 4+  Knee extension _1 Ankle dorsiflexion      Ankle plantarflexion      Ankle inversion      Ankle eversion       (Blank rows = not tested)    LUMBAR SPECIAL TESTS:  Straight leg raise test: L LE feels it in groin, Slump test: Positive, Single leg stance test: Positive, and FABER test: Positive on L; anteriorly tilted L inominate  FUNCTIONAL TESTS:  SLS:  5 sec on R, 3 sec on L ([place] noted bilat) (from eval)  8 sec on R, 16 sec on L, (10/17)  5 sec on R (22 sec on second rep), 30 sec on L on 10/11/22  5x STS 17 sec (10/17)     TODAY'S TREATMENT  10/11/22 THEREX Treadmill 1.5 mph x 43mn  Standing Lateral hip shift 2x30 sec SLS multiple reps R side Standing hip flexon RTB x 10 ea  Sitting Figure 4 stretch 2x30 sec bil Piriformis 4 stretch 2x30 sec bil On pball pelvic tilts x10 On pball hip flexion x10 On pball LAQ x10  Supine Butterfly stretch 2x30 sec SL bridge x 10 bil  Sidelying Clamshell with hip IR x10 on bil  10/06/22 THEREX Treadmill 1.4 mph x  553m  Standing Lateral hip shift 2x30 sec  Sitting Figure 4 stretch 2x30 sec Piriformis 4 stretch 2x30 sec On pball pelvic tilts x10 On pball hip flexion x10 On pball LAQ x10  Supine Butterfly stretch 2x30 sec Pelvic tilts x10  Sidelying Clamshell with hip IR x10 on R  MANUAL THERAPY Moist heat with STM and TPR L glute med, QL, and lumbar paraspinals   10/04/22 THEREX Treadmill 1.3 mph x 5 min  Sitting Hamstring stretch x30 sec Figure 4 stretch x 30 sec Hip IR yellow TB 2x10 Hip ER yellow TB 2x10  Quadruped Bird dog 2 x10 Fire hydrant 2 x10  Standing QL stretch at door 2x30 sec Pball press 10x5 sec Captain morgan 10 x5 sec   09/29/22 THEREX Treadmill 1.3 mph x 5 min  Sidelying Clamshell yellow TB 2x10  Sitting Hip IR yellow TB x10 Hip ER yellow TB x10 Hamstring stretch x30 sec Figure 4 stretch x30 sec Lateral flexion stretch x 30 sec  Supine Single  leg bridge x10  Standing Side step red TB 3x10' Palloff press doubled red TB 2x10 Shoulder ext red TB 2x10 Reactive row iso red TB x10  MANUAL THERAPY STM & TPR bilat glutes L pelvic posterior rotation      PATIENT EDUCATION:  Education details: Discussed exam findings, POC, and initial HEP Person educated: Patient Education method: Explanation, Demonstration, and Handouts Education comprehension: verbalized understanding, returned demonstration, and needs further education   HOME EXERCISE PROGRAM: Access Code: [email or web address] URL: [email or web address] Date: 09/27/2022 Prepared by: Estill Bamberg April Thurnell Garbe  Exercises - Supine Butterfly Groin Stretch  - 1 x daily - 7 x weekly - 2 sets - 30 sec hold - Supine Piriformis Stretch with Foot on Ground  - 1 x daily - 7 x weekly - 2 sets - 30 sec hold - Seated Hip Flexor Stretch  - 1 x daily - 7 x weekly - 2 sets - 30 sec hold - Right Standing Lateral Shift Correction at Wall - Repetitions  - 1 x daily - 7 x weekly - 3 sets - 10 sec  hold - Sit to Stand  - 1 x daily - 7 x weekly - 2 sets - 10 reps - Standing Anti-Rotation Press with Anchored Resistance  - 1 x daily - 7 x weekly - 2 sets - 10 reps - 3 sec hold - Side Stepping with Resistance at Ankles and Counter Support  - 1 x daily - 7 x weekly - 2 sets - 10 reps - Single Leg Bridge  - 1 x daily - 7 x weekly - 2 sets - 10 reps - Clamshell with Resistance  - 1 x daily - 7 x weekly - 1-2 sets - 10 reps - Beginner Front Arm Support  - 1 x daily - 7 x weekly - 2 sets - 10 reps  ASSESSMENT:  CLINICAL IMPRESSION: Hunter is improving in both strength and ROM. Still demonstrates some hip weakness as well as core weakness. She continues to demonstrate potential for improvement and would benefit from continued skilled therapy to address impairments.    OBJECTIVE IMPAIRMENTS Abnormal gait, decreased activity tolerance, decreased mobility, difficulty walking, decreased ROM, decreased strength, hypomobility, increased fascial restrictions, increased muscle spasms, impaired flexibility, improper body mechanics, postural dysfunction, and pain.   ACTIVITY LIMITATIONS lifting, bending, standing, sleeping, stairs, transfers, bed mobility, and locomotion level  PARTICIPATION LIMITATIONS: cleaning, laundry, shopping, and community activity  PERSONAL FACTORS Past/current experiences and Time since onset of injury/illness/exacerbation are also affecting patient's functional outcome.   REHAB POTENTIAL: Good  CLINICAL DECISION MAKING: Evolving/moderate complexity  EVALUATION COMPLEXITY: Moderate   GOALS: Goals reviewed with patient? Yes  SHORT TERM GOALS: Target date: 09/19/2022  Pt will be ind with initial HEP Baseline: Goal status: MET  2.  Pt will demo improved L pelvic mobility during spring testing to feel equal with R Baseline:  Goal status: MET  3.  Pt will be able to demo at least 5 sec on L SLS to demo improving LE stability Baseline: 16 sec on L Goal status:  MET  4.  Pt will be able to tolerate performing 5x STS without UEs with equal weight shift with pain </=5/10 to demo improving L LE functional strength Baseline: 0/10; 17 sec Goal status: MET    LONG TERM GOALS: Target date: 10/17/2022  Pt will be ind with maintaining community and home wellness exercise program Baseline:  Goal status: IN PROGRESS  2.  Pt will be able to demo SLS for  at least 10 sec to demo improved bilat LE stability in standing Baseline:  Goal status: IN PROGRESS  3.  Pt will demo increased at least 4/5 bilat LE strength Baseline:  Goal status: IN PROGRESS  4.  Pt will demo L=R lateral lumbar flexion AROM for improved trunk posture and symmetry for standing balance Baseline:  Goal status: IN PROGRESS  5.  Pt will have increased FOTO score to 41 Baseline: 26 Goal status: IN PROGRESS     PLAN: PT FREQUENCY: 2x/week  PT DURATION: 8 weeks  PLANNED INTERVENTIONS: Therapeutic exercises, Therapeutic activity, Neuromuscular re-education, Balance training, Gait training, Patient/Family education, Self Care, Joint mobilization, Aquatic Therapy, Dry Needling, Electrical stimulation, Spinal mobilization, Cryotherapy, Moist heat, Taping, Ionotophoresis 45m/ml Dexamethasone, Manual therapy, and Re-evaluation.  PLAN FOR NEXT SESSION: Assess response to HEP. Manual therapy as indicated. Correct sacrum/pelvis alignment. Continue balance/stability   Kermit Arnette, PT 10/11/2022, 11:44 AM

## 2022-10-11 ENCOUNTER — Encounter: Payer: Self-pay | Admitting: Physical Therapy

## 2022-10-11 ENCOUNTER — Ambulatory Visit: Payer: Medicare HMO | Admitting: Physical Therapy

## 2022-10-11 DIAGNOSIS — R29898 Other symptoms and signs involving the musculoskeletal system: Secondary | ICD-10-CM

## 2022-10-11 DIAGNOSIS — M5442 Lumbago with sciatica, left side: Secondary | ICD-10-CM | POA: Diagnosis not present

## 2022-10-11 DIAGNOSIS — G8929 Other chronic pain: Secondary | ICD-10-CM | POA: Diagnosis not present

## 2022-10-11 DIAGNOSIS — M6281 Muscle weakness (generalized): Secondary | ICD-10-CM

## 2022-10-11 DIAGNOSIS — M545 Low back pain, unspecified: Secondary | ICD-10-CM | POA: Diagnosis not present

## 2022-10-11 NOTE — Therapy (Incomplete)
OUTPATIENT PHYSICAL THERAPY TREATMENT   Patient Name: Krista [name] MRN: [id number] DOB:1951-02-05, 71 y.o., female Today's Date: 10/11/2022   PT End of Session - 10/11/22 1103     Visit Number 13    Number of Visits 16    Date for PT Re-Evaluation 10/17/22    Authorization Type [employer]    Authorization - Visit Number 3    Authorization - Number of Visits 16    Progress Note Due on Visit 20    PT Start Time 1102    PT Stop Time 1143    PT Time Calculation (min) 41 min    Activity Tolerance Patient tolerated treatment well    Behavior During Therapy WFL for tasks assessed/performed                Past Medical History:  Diagnosis Date   Allergy 1974   Anxiety 2021   COPD (chronic obstructive pulmonary disease) ([place])    per pt no issues, chest CT in epic 07-29-2021   DDD (degenerative disc disease), cervical    DDD (degenerative disc disease), lumbar    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    Depression    2021   Facial asymmetry, acquired 1974   left side paralysis   GERD (gastroesophageal reflux disease) 2021   H/O: Bell's palsy 1974   per pt during pregnancy , left side paralysis, told possible bell's palsy or stroke, after testing done had left mastoidectomy;  residual left side facialy asymmetry   History of basal cell carcinoma (BCC) excision    History of cerebral aneurysm 2010   s/p coiling  ;  per pt was an incidental finding on imaging   History of Clostridioides difficile infection 2010   treated   History of COVID-19 05/08/2021   positive result in care everywhere; per pt mild to moderate symptoms that resolved   History of Helicobacter pylori infection 2010   treated   Hyperlipidemia    Hypertension    followed by pcp   Lymphocytic colitis    followed by dr c. Shary Key (GI);  dx by biopsy 09-13-2017   Macular degeneration of both eyes    Non-intractable vomiting with nausea    followed by  dr Shary Key   OA (osteoarthritis)    Osteoporosis    PONV (postoperative nausea and vomiting)    Spondylosis, unspecified    cervical and lumbar   Wears hearing aid in both ears    Past Surgical History:  Procedure Laterality Date   CARPAL TUNNEL RELEASE Bilateral    early 2000s   CATARACT EXTRACTION W/ INTRAOCULAR LENS IMPLANT Bilateral 2014   CEREBRAL ANEURYSM REPAIR  2010   endocerebral coiling   COLONOSCOPY  09/13/2017   COSMETIC SURGERY     [facility] Surgery on nose   DISTAL INTERPHALANGEAL JOINT FUSION Left 09/16/2021   Procedure: left small finger distal interphalangeal joint fusion;  Surgeon: [name], MD;  Location: [facility];  Service: Orthopedics;  Laterality: Left;  with MAC anesthesia   ESOPHAGOGASTRODUODENOSCOPY  09/22/2020   EYE SURGERY  2020   Blepharoplasty   FOOT SURGERY Bilateral    eary 2000s;  bilateral bunionectomy  and removal bone right 5th toe   FRACTURE SURGERY  1968   Shattered right ankle   MASTOIDECTOMY Left 1974   RADIOFREQUENCY ABLATION  09/01/2021   lumbar , left side   ROTATOR CUFF REPAIR Right 2014   SALPINGOOPHORECTOMY Bilateral 1995  w/ excision endometriosis via laparotomy   SPINE SURGERY  2021/2022   DDD, osteoarthritis   TONSILLECTOMY AND ADENOIDECTOMY  1970   TOTAL ABDOMINAL HYSTERECTOMY  1987   TRIGGER FINGER RELEASE Bilateral    early 2000s; one finger each hand   TUBAL LIGATION  1982   WRIST GANGLION EXCISION Left    2000s   Patient Active Problem List   Diagnosis Date Noted   Osteoporosis 11/05/2020   DDD (degenerative disc disease), cervical 07/06/2020   Facet arthritis of lumbar region 05/06/2020   Bilateral wrist pain 05/06/2020   Inflammatory arthritis 03/13/2020   Osteoarthritis of left little finger 07/31/2018   Acute bronchitis with COPD ([place]) 05/10/2018   Impacted cerumen of left ear 03/07/2018   History of left mastoidectomy 03/07/2018   Tympanosclerosis, left ear 03/07/2018   Cardiac risk  counseling 09/05/2017   Essential hypertension 09/05/2017   Mixed hyperlipidemia 09/05/2017   History of Bell's palsy 08/15/2017   History of osteoporosis 08/15/2017   History of colon polyps 08/15/2017   History of nonmelanoma skin cancer 08/15/2017   Brain aneurysm 08/15/2017   Bilateral hearing loss 08/15/2017   H/O hysterectomy for benign disease 08/15/2017    PCP: [name]  REFERRING PROVIDER: [name]   REFERRING DIAG: M51.16 (ICD-10-CM) - Intervertebral disc disorders with radiculopathy, lumbar region  Rationale for Evaluation and Treatment Rehabilitation  THERAPY DIAG:  Chronic bilateral low back pain with left-sided sciatica  Other symptoms and signs involving the musculoskeletal system  Muscle weakness (generalized)  Acute bilateral low back pain without sciatica  ONSET DATE: 2-3 years  SUBJECTIVE:                                                                                                                                                                                           SUBJECTIVE STATEMENT: ***  PERTINENT HISTORY:  Has had PT in the past; history of multiple lumbar injections, ablations and blocks  PAIN:  Are you having pain? Yes: NPRS scale: currently 5/10 Pain location: Left low back Pain description: Throbbing pain, as it works to front of the hip it gets more painful Aggravating factors: Walking, house work Relieving factors: Heat/ice   PRECAUTIONS: None  WEIGHT BEARING RESTRICTIONS No  FALLS:  Has patient fallen in last 6 months? No  LIVING ENVIRONMENT: Lives with: lives with their spouse Lives in: House/apartment Stairs: Yes: External: 2 steps; on right going up Has following equipment at home: None  OCCUPATION: Retired  PLOF: Independent  PATIENT GOALS Decrease pain   OBJECTIVE:   VITALS 10/31: BP 128/74; HR 88; spO2 98%  PATIENT SURVEYS:  FOTO 26;  predicted 41  MUSCLE LENGTH: Hamstrings: Right ~80  deg; Left ~70 deg (from eval)  POSTURE:  R trunk lean, L iliac crest higher than R, R weight shift   (from eval)  PALPATION: Hypomobile with L ASIS PA mobs, TTP bilat glutes L>R, L QL, bilat piriformis. L PSIS PA mobs causes R hip spasm (from eval)  LUMBAR ROM:   Active  A/PROM  eval Active 10/11/22  Flexion 75% Feels pull   Extension 50% Increases pain   Right lateral flexion To knee feels pain   Left lateral flexion 3 inches above knee feels pull 1.5 inch above knee  Right rotation WFL   Left rotation Feels pull on back and L groin but WFL    (Blank rows = not tested)  LOWER EXTREMITY MMT  MMT Right eval Left eval Right  10/11/22 Left 10/11/22  Hip flexion _0 4+  Hip extension 3 3 4+ 4+  Hip abduction 3+ 3+ 5 5  Hip adduction      Hip internal rotation      Hip external rotation      Knee flexion 4 3+ 5 4+  Knee extension _1 Ankle dorsiflexion      Ankle plantarflexion      Ankle inversion      Ankle eversion       (Blank rows = not tested)    LUMBAR SPECIAL TESTS:  Straight leg raise test: L LE feels it in groin, Slump test: Positive, Single leg stance test: Positive, and FABER test: Positive on L; anteriorly tilted L inominate  FUNCTIONAL TESTS:  SLS:  5 sec on R, 3 sec on L ([place] noted bilat) (from eval)  8 sec on R, 16 sec on L, (10/17)  5 sec on R (22 sec on second rep), 30 sec on L on 10/11/22  5x STS 17 sec (10/17)     TODAY'S TREATMENT  10/13/22 THEREX *** Treadmill 1.5 mph x 52mn  Standing Lateral hip shift 2x30 sec SLS multiple reps R side Standing hip flexon RTB x 10 ea  Sitting Figure 4 stretch 2x30 sec bil Piriformis 4 stretch 2x30 sec bil On pball pelvic tilts x10 On pball hip flexion x10 On pball LAQ x10  Supine Butterfly stretch 2x30 sec SL bridge x 10 bil  Sidelying Clamshell with hip IR x10 on bil  10/11/22 THEREX Treadmill 1.5 mph x 583m  Standing Lateral hip shift 2x30 sec SLS multiple reps R  side Standing hip flexon RTB x 10 ea  Sitting Figure 4 stretch 2x30 sec bil Piriformis 4 stretch 2x30 sec bil On pball pelvic tilts x10 On pball hip flexion x10 On pball LAQ x10  Supine Butterfly stretch 2x30 sec SL bridge x 10 bil  Sidelying Clamshell with hip IR x10 on bil  10/06/22 THEREX Treadmill 1.4 mph x 54m57m Standing Lateral hip shift 2x30 sec  Sitting Figure 4 stretch 2x30 sec Piriformis 4 stretch 2x30 sec On pball pelvic tilts x10 On pball hip flexion x10 On pball LAQ x10  Supine Butterfly stretch 2x30 sec Pelvic tilts x10  Sidelying Clamshell with hip IR x10 on R  MANUAL THERAPY Moist heat with STM and TPR L glute med, QL, and lumbar paraspinals    PATIENT EDUCATION:  Education details: Discussed exam findings, POC, and initial HEP Person educated: Patient Education method: Explanation, Demonstration, and Handouts Education comprehension: verbalized understanding, returned demonstration, and needs further education   HOME EXERCISE PROGRAM: Access Code:  [email or web address] URL: [email or web address] Date: 09/27/2022 Prepared by: Estill Bamberg April Thurnell Garbe  Exercises - Supine Butterfly Groin Stretch  - 1 x daily - 7 x weekly - 2 sets - 30 sec hold - Supine Piriformis Stretch with Foot on Ground  - 1 x daily - 7 x weekly - 2 sets - 30 sec hold - Seated Hip Flexor Stretch  - 1 x daily - 7 x weekly - 2 sets - 30 sec hold - Right Standing Lateral Shift Correction at Wall - Repetitions  - 1 x daily - 7 x weekly - 3 sets - 10 sec hold - Sit to Stand  - 1 x daily - 7 x weekly - 2 sets - 10 reps - Standing Anti-Rotation Press with Anchored Resistance  - 1 x daily - 7 x weekly - 2 sets - 10 reps - 3 sec hold - Side Stepping with Resistance at Ankles and Counter Support  - 1 x daily - 7 x weekly - 2 sets - 10 reps - Single Leg Bridge  - 1 x daily - 7 x weekly - 2 sets - 10 reps - Clamshell with Resistance  - 1 x daily - 7 x weekly - 1-2 sets - 10  reps - Beginner Front Arm Support  - 1 x daily - 7 x weekly - 2 sets - 10 reps  ASSESSMENT:  CLINICAL IMPRESSION: ***   OBJECTIVE IMPAIRMENTS Abnormal gait, decreased activity tolerance, decreased mobility, difficulty walking, decreased ROM, decreased strength, hypomobility, increased fascial restrictions, increased muscle spasms, impaired flexibility, improper body mechanics, postural dysfunction, and pain.   ACTIVITY LIMITATIONS lifting, bending, standing, sleeping, stairs, transfers, bed mobility, and locomotion level  PARTICIPATION LIMITATIONS: cleaning, laundry, shopping, and community activity  PERSONAL FACTORS Past/current experiences and Time since onset of injury/illness/exacerbation are also affecting patient's functional outcome.   REHAB POTENTIAL: Good  CLINICAL DECISION MAKING: Evolving/moderate complexity  EVALUATION COMPLEXITY: Moderate   GOALS: Goals reviewed with patient? Yes  SHORT TERM GOALS: Target date: 09/19/2022  Pt will be ind with initial HEP Baseline: Goal status: MET  2.  Pt will demo improved L pelvic mobility during spring testing to feel equal with R Baseline:  Goal status: MET  3.  Pt will be able to demo at least 5 sec on L SLS to demo improving LE stability Baseline: 16 sec on L Goal status: MET  4.  Pt will be able to tolerate performing 5x STS without UEs with equal weight shift with pain </=5/10 to demo improving L LE functional strength Baseline: 0/10; 17 sec Goal status: MET    LONG TERM GOALS: Target date: 10/17/2022  Pt will be ind with maintaining community and home wellness exercise program Baseline:  Goal status: IN PROGRESS  2.  Pt will be able to demo SLS for at least 10 sec to demo improved bilat LE stability in standing Baseline:  Goal status: IN PROGRESS  3.  Pt will demo increased at least 4/5 bilat LE strength Baseline:  Goal status: IN PROGRESS  4.  Pt will demo L=R lateral lumbar flexion AROM for improved  trunk posture and symmetry for standing balance Baseline:  Goal status: IN PROGRESS  5.  Pt will have increased FOTO score to 41 Baseline: 26 Goal status: IN PROGRESS     PLAN: PT FREQUENCY: 2x/week  PT DURATION: 8 weeks  PLANNED INTERVENTIONS: Therapeutic exercises, Therapeutic activity, Neuromuscular re-education, Balance training, Gait training, Patient/Family education, Self Care, Joint mobilization, Aquatic Therapy, Dry Needling, Electrical  stimulation, Spinal mobilization, Cryotherapy, Moist heat, Taping, Ionotophoresis 69m/ml Dexamethasone, Manual therapy, and Re-evaluation.  PLAN FOR NEXT SESSION: Assess response to HEP. Manual therapy as indicated. Correct sacrum/pelvis alignment. Continue balance/stability   Carrol Hougland, PT 10/11/2022, 11:44 AM

## 2022-10-13 ENCOUNTER — Ambulatory Visit: Payer: Medicare HMO | Admitting: Physical Therapy

## 2022-10-18 ENCOUNTER — Ambulatory Visit: Payer: Medicare HMO | Admitting: Physical Therapy

## 2022-10-18 ENCOUNTER — Encounter: Payer: Self-pay | Admitting: Medical-Surgical

## 2022-10-18 ENCOUNTER — Encounter: Payer: Self-pay | Admitting: Physical Therapy

## 2022-10-18 DIAGNOSIS — G8929 Other chronic pain: Secondary | ICD-10-CM

## 2022-10-18 DIAGNOSIS — M545 Low back pain, unspecified: Secondary | ICD-10-CM | POA: Diagnosis not present

## 2022-10-18 DIAGNOSIS — R29898 Other symptoms and signs involving the musculoskeletal system: Secondary | ICD-10-CM

## 2022-10-18 DIAGNOSIS — M6281 Muscle weakness (generalized): Secondary | ICD-10-CM

## 2022-10-18 DIAGNOSIS — M5442 Lumbago with sciatica, left side: Secondary | ICD-10-CM | POA: Diagnosis not present

## 2022-10-18 NOTE — Therapy (Signed)
OUTPATIENT PHYSICAL THERAPY TREATMENT   Patient Name: Krista [name] MRN: [id number] DOB:04-29-1951, 71 y.o., female Today's Date: 10/18/2022   PT End of Session - 10/18/22 1105     Visit Number 14    Number of Visits 16    Date for PT Re-Evaluation 10/17/22    Authorization Type [employer]    Authorization - Visit Number 109    Authorization - Number of Visits 16    Progress Note Due on Visit 20    PT Start Time 1101    PT Stop Time 1145    PT Time Calculation (min) 44 min    Activity Tolerance Patient tolerated treatment well    Behavior During Therapy WFL for tasks assessed/performed                Past Medical History:  Diagnosis Date   Allergy 1974   Anxiety 2021   COPD (chronic obstructive pulmonary disease) ([place])    per pt no issues, chest CT in epic 07-29-2021   DDD (degenerative disc disease), cervical    DDD (degenerative disc disease), lumbar    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    Depression    2021   Facial asymmetry, acquired 1974   left side paralysis   GERD (gastroesophageal reflux disease) 2021   H/O: Bell's palsy 1974   per pt during pregnancy , left side paralysis, told possible bell's palsy or stroke, after testing done had left mastoidectomy;  residual left side facialy asymmetry   History of basal cell carcinoma (BCC) excision    History of cerebral aneurysm 2010   s/p coiling  ;  per pt was an incidental finding on imaging   History of Clostridioides difficile infection 2010   treated   History of COVID-19 05/08/2021   positive result in care everywhere; per pt mild to moderate symptoms that resolved   History of Helicobacter pylori infection 2010   treated   Hyperlipidemia    Hypertension    followed by pcp   Lymphocytic colitis    followed by dr c. Shary Key (GI);  dx by biopsy 09-13-2017   Macular degeneration of both eyes    Non-intractable vomiting with nausea    followed by  dr Shary Key   OA (osteoarthritis)    Osteoporosis    PONV (postoperative nausea and vomiting)    Spondylosis, unspecified    cervical and lumbar   Wears hearing aid in both ears    Past Surgical History:  Procedure Laterality Date   CARPAL TUNNEL RELEASE Bilateral    early 2000s   CATARACT EXTRACTION W/ INTRAOCULAR LENS IMPLANT Bilateral 2014   CEREBRAL ANEURYSM REPAIR  2010   endocerebral coiling   COLONOSCOPY  09/13/2017   COSMETIC SURGERY     [facility] Surgery on nose   DISTAL INTERPHALANGEAL JOINT FUSION Left 09/16/2021   Procedure: left small finger distal interphalangeal joint fusion;  Surgeon: [name], MD;  Location: [facility];  Service: Orthopedics;  Laterality: Left;  with MAC anesthesia   ESOPHAGOGASTRODUODENOSCOPY  09/22/2020   EYE SURGERY  2020   Blepharoplasty   FOOT SURGERY Bilateral    eary 2000s;  bilateral bunionectomy  and removal bone right 5th toe   FRACTURE SURGERY  1968   Shattered right ankle   MASTOIDECTOMY Left 1974   RADIOFREQUENCY ABLATION  09/01/2021   lumbar , left side   ROTATOR CUFF REPAIR Right 2014   SALPINGOOPHORECTOMY Bilateral 1995  w/ excision endometriosis via laparotomy   SPINE SURGERY  2021/2022   DDD, osteoarthritis   TONSILLECTOMY AND ADENOIDECTOMY  1970   TOTAL ABDOMINAL HYSTERECTOMY  1987   TRIGGER FINGER RELEASE Bilateral    early 2000s; one finger each hand   TUBAL LIGATION  1982   WRIST GANGLION EXCISION Left    2000s   Patient Active Problem List   Diagnosis Date Noted   Osteoporosis 11/05/2020   DDD (degenerative disc disease), cervical 07/06/2020   Facet arthritis of lumbar region 05/06/2020   Bilateral wrist pain 05/06/2020   Inflammatory arthritis 03/13/2020   Osteoarthritis of left little finger 07/31/2018   Acute bronchitis with COPD ([place]) 05/10/2018   Impacted cerumen of left ear 03/07/2018   History of left mastoidectomy 03/07/2018   Tympanosclerosis, left ear 03/07/2018   Cardiac risk  counseling 09/05/2017   Essential hypertension 09/05/2017   Mixed hyperlipidemia 09/05/2017   History of Bell's palsy 08/15/2017   History of osteoporosis 08/15/2017   History of colon polyps 08/15/2017   History of nonmelanoma skin cancer 08/15/2017   Brain aneurysm 08/15/2017   Bilateral hearing loss 08/15/2017   H/O hysterectomy for benign disease 08/15/2017    PCP: [name]  REFERRING PROVIDER: [name]   REFERRING DIAG: M51.16 (ICD-10-CM) - Intervertebral disc disorders with radiculopathy, lumbar region  Rationale for Evaluation and Treatment Rehabilitation  THERAPY DIAG:  Chronic bilateral low back pain with left-sided sciatica  Other symptoms and signs involving the musculoskeletal system  Muscle weakness (generalized)  Acute bilateral low back pain without sciatica  ONSET DATE: 2-3 years  SUBJECTIVE:                                                                                                                                                                                           SUBJECTIVE STATEMENT: "It's the same."  PERTINENT HISTORY:  Has had PT in the past; history of multiple lumbar injections, ablations and blocks  PAIN:  Are you having pain? Yes: NPRS scale: currently 7/10 Pain location: Left low back Pain description: Throbbing pain, as it works to front of the hip it gets more painful Aggravating factors: Walking, house work Relieving factors: Heat/ice   PRECAUTIONS: None  WEIGHT BEARING RESTRICTIONS No  FALLS:  Has patient fallen in last 6 months? No  LIVING ENVIRONMENT: Lives with: lives with their spouse Lives in: House/apartment Stairs: Yes: External: 2 steps; on right going up Has following equipment at home: None  OCCUPATION: Retired  PLOF: Independent  PATIENT GOALS Decrease pain   OBJECTIVE:   PATIENT SURVEYS:  FOTO 26; predicted 41  MUSCLE LENGTH: Hamstrings: Right ~  80 deg; Left ~70 deg (from  eval)  POSTURE:  R trunk lean, L iliac crest higher than R, R weight shift   (from eval)  PALPATION: Hypomobile with L ASIS PA mobs, TTP bilat glutes L>R, L QL, bilat piriformis. L PSIS PA mobs causes R hip spasm (from eval)  LUMBAR ROM:   Active  A/PROM  eval Active 10/11/22  Flexion 75% Feels pull   Extension 50% Increases pain   Right lateral flexion To knee feels pain   Left lateral flexion 3 inches above knee feels pull 1.5 inch above knee  Right rotation WFL   Left rotation Feels pull on back and L groin but WFL    (Blank rows = not tested)  LOWER EXTREMITY MMT  MMT Right eval Left eval Right  10/11/22 Left 10/11/22  Hip flexion _0 4+  Hip extension 3 3 4+ 4+  Hip abduction 3+ 3+ 5 5  Hip adduction      Hip internal rotation      Hip external rotation      Knee flexion 4 3+ 5 4+  Knee extension _1 Ankle dorsiflexion      Ankle plantarflexion      Ankle inversion      Ankle eversion       (Blank rows = not tested)    LUMBAR SPECIAL TESTS:  Straight leg raise test: L LE feels it in groin, Slump test: Positive, Single leg stance test: Positive, and FABER test: Positive on L; anteriorly tilted L inominate  FUNCTIONAL TESTS:  SLS:  5 sec on R, 3 sec on L ([place] noted bilat) (from eval)  8 sec on R, 16 sec on L, (10/17)  5 sec on R (22 sec on second rep), 30 sec on L on 10/11/22  5x STS 17 sec (10/17)     TODAY'S TREATMENT  10/18/22 THEREX Treadmill 1.5 mph x 5 min  Sitting Hamstring stretch x30 sec bilat Figure 4 stretch 2x30 sec bil Piriformis 4 stretch 2x30 sec bil  Quadruped Child's pose x30 sec with lateral flexion x30 sec  Prone "I", "Y" 2x10 Row 2x10  Supine Knee bent to ext with PPT 2x8 (increase adduction on L) Butterfly stretch 2x30 sec  MANUAL THERAPY STM & TPR L glute, piriformis, lumbar paraspinals Grade II to III thoracolumbar ext mobs Skilled assessment and palpation for [facility]    10/11/22 THEREX Treadmill  1.5 mph x 31mn  Standing Lateral hip shift 2x30 sec SLS multiple reps R side Standing hip flexon RTB x 10 ea  Sitting Figure 4 stretch 2x30 sec bil Piriformis 4 stretch 2x30 sec bil On pball pelvic tilts x10 On pball hip flexion x10 On pball LAQ x10  Supine Butterfly stretch 2x30 sec SL bridge x 10 bil  Sidelying Clamshell with hip IR x10 on bil  10/06/22 THEREX Treadmill 1.4 mph x 565m  Standing Lateral hip shift 2x30 sec  Sitting Figure 4 stretch 2x30 sec Piriformis 4 stretch 2x30 sec On pball pelvic tilts x10 On pball hip flexion x10 On pball LAQ x10  Supine Butterfly stretch 2x30 sec Pelvic tilts x10  Sidelying Clamshell with hip IR x10 on R  MANUAL THERAPY Moist heat with STM and TPR L glute med, QL, and lumbar paraspinals   10/04/22 THEREX Treadmill 1.3 mph x 5 min  Sitting Hamstring stretch x30 sec Figure 4 stretch x 30 sec Hip IR yellow TB 2x10 Hip ER yellow TB 2x10  QuLanny Cramp  dog 2 x10 Fire hydrant 2 x10  Standing QL stretch at door 2x30 sec Pball press 10x5 sec Captain morgan 10 x5 sec   09/29/22 THEREX Treadmill 1.3 mph x 5 min  Sidelying Clamshell yellow TB 2x10  Sitting Hip IR yellow TB x10 Hip ER yellow TB x10 Hamstring stretch x30 sec Figure 4 stretch x30 sec Lateral flexion stretch x 30 sec  Supine Single leg bridge x10  Standing Side step red TB 3x10' Palloff press doubled red TB 2x10 Shoulder ext red TB 2x10 Reactive row iso red TB x10  MANUAL THERAPY STM & TPR bilat glutes L pelvic posterior rotation      PATIENT EDUCATION:  Education details: Discussed exam findings, POC, and initial HEP Person educated: Patient Education method: Explanation, Demonstration, and Handouts Education comprehension: verbalized understanding, returned demonstration, and needs further education   HOME EXERCISE PROGRAM: Access Code: [email or web address] URL: [email or web address] Date: 09/27/2022 Prepared by:  Estill Bamberg April Thurnell Garbe  Exercises - Supine Butterfly Groin Stretch  - 1 x daily - 7 x weekly - 2 sets - 30 sec hold - Supine Piriformis Stretch with Foot on Ground  - 1 x daily - 7 x weekly - 2 sets - 30 sec hold - Seated Hip Flexor Stretch  - 1 x daily - 7 x weekly - 2 sets - 30 sec hold - Right Standing Lateral Shift Correction at Wall - Repetitions  - 1 x daily - 7 x weekly - 3 sets - 10 sec hold - Sit to Stand  - 1 x daily - 7 x weekly - 2 sets - 10 reps - Standing Anti-Rotation Press with Anchored Resistance  - 1 x daily - 7 x weekly - 2 sets - 10 reps - 3 sec hold - Side Stepping with Resistance at Ankles and Counter Support  - 1 x daily - 7 x weekly - 2 sets - 10 reps - Single Leg Bridge  - 1 x daily - 7 x weekly - 2 sets - 10 reps - Clamshell with Resistance  - 1 x daily - 7 x weekly - 1-2 sets - 10 reps - Beginner Front Arm Support  - 1 x daily - 7 x weekly - 2 sets - 10 reps  ASSESSMENT:  CLINICAL IMPRESSION: Pt has met 2 out of 5 LTGs. At this point, she is demonstrating increasing hip and core strength; however, her L low back and glute pain remains. Performed trial of TPDN this session in hopes of improving her muscle imbalances. Initiated midback exercises for improved superior stability to her pelvis. Pt would benefit from continued PT to work on her remaining goals and increasing mobility.   OBJECTIVE IMPAIRMENTS Abnormal gait, decreased activity tolerance, decreased mobility, difficulty walking, decreased ROM, decreased strength, hypomobility, increased fascial restrictions, increased muscle spasms, impaired flexibility, improper body mechanics, postural dysfunction, and pain.   ACTIVITY LIMITATIONS lifting, bending, standing, sleeping, stairs, transfers, bed mobility, and locomotion level  PARTICIPATION LIMITATIONS: cleaning, laundry, shopping, and community activity  PERSONAL FACTORS Past/current experiences and Time since onset of injury/illness/exacerbation are also  affecting patient's functional outcome.   REHAB POTENTIAL: Good  CLINICAL DECISION MAKING: Evolving/moderate complexity  EVALUATION COMPLEXITY: Moderate   GOALS: Goals reviewed with patient? Yes  SHORT TERM GOALS: Target date: 09/19/2022  Pt will be ind with initial HEP Baseline: Goal status: MET  2.  Pt will demo improved L pelvic mobility during spring testing to feel equal with R Baseline:  Goal status: MET  3.  Pt will be able to demo at least 5 sec on L SLS to demo improving LE stability Baseline: 16 sec on L Goal status: MET  4.  Pt will be able to tolerate performing 5x STS without UEs with equal weight shift with pain </=5/10 to demo improving L LE functional strength Baseline: 0/10; 17 sec Goal status: MET    LONG TERM GOALS: Target date: 10/17/2022   Pt will be ind with maintaining community and home wellness exercise program Baseline:  Goal status: IN PROGRESS  2.  Pt will be able to demo SLS for at least 10 sec to demo improved bilat LE stability in standing Baseline:  Goal status: MET  3.  Pt will demo increased at least 4/5 bilat LE strength Baseline:  Goal status: MET  4.  Pt will demo L=R lateral lumbar flexion AROM for improved trunk posture and symmetry for standing balance Baseline: L low back remains more limited than R Goal status: IN PROGRESS  5.  Pt will have increased FOTO score to 41 Baseline: 26 Goal status: IN PROGRESS  REVISED LONG TERM GOALS TARGET DATE: 11/15/2022   Pt will be ind with maintaining community and home wellness exercise program Baseline:  Goal status: IN PROGRESS  2.  Pt will demo L=R lateral lumbar flexion AROM for improved trunk posture and symmetry for standing balance Baseline: L low back remains more limited than R Goal status: IN PROGRESS  3.  Pt will have increased FOTO score to 41 Baseline: 26 Goal status: IN PROGRESS  4. Pt will report improved pain by >/= 50%  Goal status: NEW  PLAN: PT  FREQUENCY: 2x/week  PT DURATION: 8 weeks  PLANNED INTERVENTIONS: Therapeutic exercises, Therapeutic activity, Neuromuscular re-education, Balance training, Gait training, Patient/Family education, Self Care, Joint mobilization, Aquatic Therapy, Dry Needling, Electrical stimulation, Spinal mobilization, Cryotherapy, Moist heat, Taping, Ionotophoresis 31m/ml Dexamethasone, Manual therapy, and Re-evaluation.  PLAN FOR NEXT SESSION: Assess response to HEP. Manual therapy as indicated. Correct sacrum/pelvis alignment. Continue balance/stability   GOphthalmology Surgery Center Of Orlando [facility] Dba [facility] Ophthalmology Surgery CenterApril MGordy Levan PT, DPT 10/18/2022, 12:56 PM

## 2022-10-20 ENCOUNTER — Ambulatory Visit: Payer: Medicare HMO | Admitting: Physical Therapy

## 2022-10-20 ENCOUNTER — Encounter: Payer: Self-pay | Admitting: Physical Therapy

## 2022-10-20 DIAGNOSIS — R29898 Other symptoms and signs involving the musculoskeletal system: Secondary | ICD-10-CM | POA: Diagnosis not present

## 2022-10-20 DIAGNOSIS — G8929 Other chronic pain: Secondary | ICD-10-CM | POA: Diagnosis not present

## 2022-10-20 DIAGNOSIS — M5442 Lumbago with sciatica, left side: Secondary | ICD-10-CM | POA: Diagnosis not present

## 2022-10-20 DIAGNOSIS — M6281 Muscle weakness (generalized): Secondary | ICD-10-CM

## 2022-10-20 DIAGNOSIS — M545 Low back pain, unspecified: Secondary | ICD-10-CM

## 2022-10-20 NOTE — Therapy (Signed)
OUTPATIENT PHYSICAL THERAPY TREATMENT   Patient Name: Krista [name] MRN: [id number] DOB:01/24/1951, 71 y.o., female Today's Date: 10/20/2022   PT End of Session - 10/20/22 1013     Visit Number 15    Number of Visits 16    Date for PT Re-Evaluation 10/17/22    Authorization Type [employer]    Authorization - Visit Number 15    Authorization - Number of Visits 16    Progress Note Due on Visit 20    PT Start Time 1015    PT Stop Time 1100    PT Time Calculation (min) 45 min    Activity Tolerance Patient tolerated treatment well    Behavior During Therapy WFL for tasks assessed/performed                Past Medical History:  Diagnosis Date   Allergy 1974   Anxiety 2021   COPD (chronic obstructive pulmonary disease) (HCC)    per pt no issues, chest CT in epic 07-29-2021   DDD (degenerative disc disease), cervical    DDD (degenerative disc disease), lumbar    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    Depression    2021   Facial asymmetry, acquired 1974   left side paralysis   GERD (gastroesophageal reflux disease) 2021   H/O: Bell's palsy 1974   per pt during pregnancy , left side paralysis, told possible bell's palsy or stroke, after testing done had left mastoidectomy;  residual left side facialy asymmetry   History of basal cell carcinoma (BCC) excision    History of cerebral aneurysm 2010   s/p coiling  ;  per pt was an incidental finding on imaging   History of Clostridioides difficile infection 2010   treated   History of COVID-19 05/08/2021   positive result in care everywhere; per pt mild to moderate symptoms that resolved   History of Helicobacter pylori infection 2010   treated   Hyperlipidemia    Hypertension    followed by pcp   Lymphocytic colitis    followed by dr c. katopes (GI);  dx by biopsy 09-13-2017   Macular degeneration of both eyes    Non-intractable vomiting with nausea    followed by  dr katopes   OA (osteoarthritis)    Osteoporosis    PONV (postoperative nausea and vomiting)    Spondylosis, unspecified    cervical and lumbar   Wears hearing aid in both ears    Past Surgical History:  Procedure Laterality Date   CARPAL TUNNEL RELEASE Bilateral    early 2000s   CATARACT EXTRACTION W/ INTRAOCULAR LENS IMPLANT Bilateral 2014   CEREBRAL ANEURYSM REPAIR  2010   endocerebral coiling   COLONOSCOPY  09/13/2017   COSMETIC SURGERY     [facility] Surgery on nose   DISTAL INTERPHALANGEAL JOINT FUSION Left 09/16/2021   Procedure: left small finger distal interphalangeal joint fusion;  Surgeon: Spears, James, MD;  Location: [facility];  Service: Orthopedics;  Laterality: Left;  with MAC anesthesia   ESOPHAGOGASTRODUODENOSCOPY  09/22/2020   EYE SURGERY  2020   Blepharoplasty   FOOT SURGERY Bilateral    eary 2000s;  bilateral bunionectomy  and removal bone right 5th toe   FRACTURE SURGERY  1968   Shattered right ankle   MASTOIDECTOMY Left 1974   RADIOFREQUENCY ABLATION  09/01/2021   lumbar , left side   ROTATOR CUFF REPAIR Right 2014   SALPINGOOPHORECTOMY Bilateral 1995     w/ excision endometriosis via laparotomy   SPINE SURGERY  2021/2022   DDD, osteoarthritis   TONSILLECTOMY AND ADENOIDECTOMY  1970   TOTAL ABDOMINAL HYSTERECTOMY  1987   TRIGGER FINGER RELEASE Bilateral    early 2000s; one finger each hand   TUBAL LIGATION  1982   WRIST GANGLION EXCISION Left    2000s   Patient Active Problem List   Diagnosis Date Noted   Osteoporosis 11/05/2020   DDD (degenerative disc disease), cervical 07/06/2020   Facet arthritis of lumbar region 05/06/2020   Bilateral wrist pain 05/06/2020   Inflammatory arthritis 03/13/2020   Osteoarthritis of left little finger 07/31/2018   Acute bronchitis with COPD ([place]) 05/10/2018   Impacted cerumen of left ear 03/07/2018   History of left mastoidectomy 03/07/2018   Tympanosclerosis, left ear 03/07/2018   Cardiac risk  counseling 09/05/2017   Essential hypertension 09/05/2017   Mixed hyperlipidemia 09/05/2017   History of Bell's palsy 08/15/2017   History of osteoporosis 08/15/2017   History of colon polyps 08/15/2017   History of nonmelanoma skin cancer 08/15/2017   Brain aneurysm 08/15/2017   Bilateral hearing loss 08/15/2017   H/O hysterectomy for benign disease 08/15/2017    PCP: [name]  REFERRING PROVIDER: [name]   REFERRING DIAG: M51.16 (ICD-10-CM) - Intervertebral disc disorders with radiculopathy, lumbar region  Rationale for Evaluation and Treatment Rehabilitation  THERAPY DIAG:  Chronic bilateral low back pain with left-sided sciatica  Other symptoms and signs involving the musculoskeletal system  Muscle weakness (generalized)  Acute bilateral low back pain without sciatica  ONSET DATE: 2-3 years  SUBJECTIVE:                                                                                                                                                                                           SUBJECTIVE STATEMENT: Pt states needling did help some of her pain. Pt reports she felt well enough to go outside and rake.   PERTINENT HISTORY:  Has had PT in the past; history of multiple lumbar injections, ablations and blocks  PAIN:  Are you having pain? Yes: NPRS scale: 5/10 Pain location: Left low back Pain description: Throbbing pain, as it works to front of the hip it gets more painful Aggravating factors: Walking, house work Relieving factors: Heat/ice   PRECAUTIONS: None  WEIGHT BEARING RESTRICTIONS No  FALLS:  Has patient fallen in last 6 months? No  LIVING ENVIRONMENT: Lives with: lives with their spouse Lives in: House/apartment Stairs: Yes: External: 2 steps; on right going up Has following equipment at home: None  OCCUPATION: Retired  PLOF: Independent  PATIENT GOALS Decrease pain  OBJECTIVE:   PATIENT SURVEYS:  FOTO 26; predicted  41   POSTURE:  R trunk lean, L iliac crest higher than R, R weight shift   (from eval)  PALPATION: Hypomobile with L ASIS PA mobs, TTP bilat glutes L>R, L QL, bilat piriformis. L PSIS PA mobs causes R hip spasm (from eval)  LUMBAR ROM:   Active  A/PROM  eval Active 10/11/22  Flexion 75% Feels pull   Extension 50% Increases pain   Right lateral flexion To knee feels pain   Left lateral flexion 3 inches above knee feels pull 1.5 inch above knee  Right rotation WFL   Left rotation Feels pull on back and L groin but WFL    (Blank rows = not tested)  LOWER EXTREMITY MMT  MMT Right eval Left eval Right  10/11/22 Left 10/11/22  Hip flexion _0 4+  Hip extension 3 3 4+ 4+  Hip abduction 3+ 3+ 5 5  Hip adduction      Hip internal rotation      Hip external rotation      Knee flexion 4 3+ 5 4+  Knee extension _1 Ankle dorsiflexion      Ankle plantarflexion      Ankle inversion      Ankle eversion       (Blank rows = not tested)    LUMBAR SPECIAL TESTS:  Straight leg raise test: L LE feels it in groin, Slump test: Positive, Single leg stance test: Positive, and FABER test: Positive on L; anteriorly tilted L inominate  FUNCTIONAL TESTS:  SLS:  5 sec on R, 3 sec on L ([place] noted bilat) (from eval)  8 sec on R, 16 sec on L, (10/17)  5 sec on R (22 sec on second rep), 30 sec on L on 10/11/22  5x STS 17 sec (10/17)  12 sec (11/16)    TODAY'S TREATMENT  10/20/22 THEREX Treadmill 2.0 mph x 5 min  Sitting Figure 4 stretch 2x30 sec bilat Piriformis stretch 2x30 sec bilat  Standing Resisted walking sports cord forward, L&R, back x10 each (cues to decrease trunk lean)  MANUAL THERAPY STM & TPR L glute, piriformis, QL, sacrum Skilled assessment and palpation for TPDN  Trigger Point Dry-Needling  Treatment instructions: Expect mild to moderate muscle soreness. S/S of pneumothorax if dry needled over a lung field, and to seek immediate medical attention  should they occur. Patient verbalized understanding of these instructions and education.  Patient Consent Given: Yes Education handout provided: Previously provided Muscles treated: bilat glutes, piriformis Electrical stimulation performed: No Parameters: N/A Treatment response/outcome: Twitch response, palpable increase muscle length   10/18/22 THEREX Treadmill 1.5 mph x 5 min  Sitting Hamstring stretch x30 sec bilat Figure 4 stretch 2x30 sec bil Piriformis 4 stretch 2x30 sec bil  Quadruped Child's pose x30 sec with lateral flexion x30 sec  Prone "I", "Y" 2x10 Row 2x10  Supine Knee bent to ext with PPT 2x8 (increase adduction on L) Butterfly stretch 2x30 sec  MANUAL THERAPY STM & TPR L glute, piriformis, lumbar paraspinals Grade II to III thoracolumbar ext mobs Skilled assessment and palpation for [facility]    10/11/22 THEREX Treadmill 1.5 mph x 74mn  Standing Lateral hip shift 2x30 sec SLS multiple reps R side Standing hip flexon RTB x 10 ea  Sitting Figure 4 stretch 2x30 sec bil Piriformis 4 stretch 2x30 sec bil On pball pelvic tilts x10 On pball hip flexion x10 On pball  LAQ x10  Supine Butterfly stretch 2x30 sec SL bridge x 10 bil  Sidelying Clamshell with hip IR x10 on bil  10/06/22 THEREX Treadmill 1.4 mph x 5min  Standing Lateral hip shift 2x30 sec  Sitting Figure 4 stretch 2x30 sec Piriformis 4 stretch 2x30 sec On pball pelvic tilts x10 On pball hip flexion x10 On pball LAQ x10  Supine Butterfly stretch 2x30 sec Pelvic tilts x10  Sidelying Clamshell with hip IR x10 on R  MANUAL THERAPY Moist heat with STM and TPR L glute med, QL, and lumbar paraspinals     PATIENT EDUCATION:  Education details: Discussed exam findings, POC, and initial HEP Person educated: Patient Education method: Explanation, Demonstration, and Handouts Education comprehension: verbalized understanding, returned demonstration, and needs further  education   HOME EXERCISE PROGRAM: Access Code: [email or web address] URL: [email or web address] Date: 09/27/2022 Prepared by: Gellen April Marie Nonato  Exercises - Supine Butterfly Groin Stretch  - 1 x daily - 7 x weekly - 2 sets - 30 sec hold - Supine Piriformis Stretch with Foot on Ground  - 1 x daily - 7 x weekly - 2 sets - 30 sec hold - Seated Hip Flexor Stretch  - 1 x daily - 7 x weekly - 2 sets - 30 sec hold - Right Standing Lateral Shift Correction at Wall - Repetitions  - 1 x daily - 7 x weekly - 3 sets - 10 sec hold - Sit to Stand  - 1 x daily - 7 x weekly - 2 sets - 10 reps - Standing Anti-Rotation Press with Anchored Resistance  - 1 x daily - 7 x weekly - 2 sets - 10 reps - 3 sec hold - Side Stepping with Resistance at Ankles and Counter Support  - 1 x daily - 7 x weekly - 2 sets - 10 reps - Single Leg Bridge  - 1 x daily - 7 x weekly - 2 sets - 10 reps - Clamshell with Resistance  - 1 x daily - 7 x weekly - 1-2 sets - 10 reps - Beginner Front Arm Support  - 1 x daily - 7 x weekly - 2 sets - 10 reps  ASSESSMENT:  CLINICAL IMPRESSION: Clella demos improved 5x STS indicating improving functional LE strength and endurance. Pt with good response to TPDN -- continued this and manual work for improved muscle extensibility. Continued LE and trunk strengthening/stabilization exercises.    OBJECTIVE IMPAIRMENTS Abnormal gait, decreased activity tolerance, decreased mobility, difficulty walking, decreased ROM, decreased strength, hypomobility, increased fascial restrictions, increased muscle spasms, impaired flexibility, improper body mechanics, postural dysfunction, and pain.   ACTIVITY LIMITATIONS lifting, bending, standing, sleeping, stairs, transfers, bed mobility, and locomotion level  PARTICIPATION LIMITATIONS: cleaning, laundry, shopping, and community activity  PERSONAL FACTORS Past/current experiences and Time since onset of injury/illness/exacerbation are also affecting  patient's functional outcome.   REHAB POTENTIAL: Good  CLINICAL DECISION MAKING: Evolving/moderate complexity  EVALUATION COMPLEXITY: Moderate   GOALS: Goals reviewed with patient? Yes  SHORT TERM GOALS: Target date: 09/19/2022  Pt will be ind with initial HEP Baseline: Goal status: MET  2.  Pt will demo improved L pelvic mobility during spring testing to feel equal with R Baseline:  Goal status: MET  3.  Pt will be able to demo at least 5 sec on L SLS to demo improving LE stability Baseline: 16 sec on L Goal status: MET  4.  Pt will be able to tolerate performing 5x STS without UEs with equal weight shift   with pain </=5/10 to demo improving L LE functional strength Baseline: 0/10; 17 sec Goal status: MET    LONG TERM GOALS: Target date: 10/17/2022   Pt will be ind with maintaining community and home wellness exercise program Baseline:  Goal status: IN PROGRESS  2.  Pt will be able to demo SLS for at least 10 sec to demo improved bilat LE stability in standing Baseline:  Goal status: MET  3.  Pt will demo increased at least 4/5 bilat LE strength Baseline:  Goal status: MET  4.  Pt will demo L=R lateral lumbar flexion AROM for improved trunk posture and symmetry for standing balance Baseline: L low back remains more limited than R Goal status: IN PROGRESS  5.  Pt will have increased FOTO score to 41 Baseline: 26 Goal status: IN PROGRESS  REVISED LONG TERM GOALS TARGET DATE: 11/15/2022   Pt will be ind with maintaining community and home wellness exercise program Baseline:  Goal status: IN PROGRESS  2.  Pt will demo L=R lateral lumbar flexion AROM for improved trunk posture and symmetry for standing balance Baseline: L low back remains more limited than R Goal status: IN PROGRESS  3.  Pt will have increased FOTO score to 41 Baseline: 26 Goal status: IN PROGRESS  4. Pt will report improved pain by >/= 50%  Goal status: NEW  PLAN: PT FREQUENCY:  2x/week  PT DURATION: 8 weeks  PLANNED INTERVENTIONS: Therapeutic exercises, Therapeutic activity, Neuromuscular re-education, Balance training, Gait training, Patient/Family education, Self Care, Joint mobilization, Aquatic Therapy, Dry Needling, Electrical stimulation, Spinal mobilization, Cryotherapy, Moist heat, Taping, Ionotophoresis 72m/ml Dexamethasone, Manual therapy, and Re-evaluation.  PLAN FOR NEXT SESSION: Assess response to HEP. Manual therapy as indicated. Correct sacrum/pelvis alignment. Continue balance/stability   GPalacios Community Medical CenterApril Ma L Jayquon Theiler, PT, DPT 10/20/2022, 10:14 AM

## 2022-10-25 ENCOUNTER — Encounter: Payer: Medicare HMO | Admitting: Physical Therapy

## 2022-11-01 ENCOUNTER — Encounter: Payer: Self-pay | Admitting: Physical Therapy

## 2022-11-01 ENCOUNTER — Encounter: Payer: Medicare HMO | Admitting: Physical Therapy

## 2022-11-01 ENCOUNTER — Ambulatory Visit: Payer: Medicare HMO | Admitting: Physical Therapy

## 2022-11-01 DIAGNOSIS — M545 Low back pain, unspecified: Secondary | ICD-10-CM

## 2022-11-01 DIAGNOSIS — M5442 Lumbago with sciatica, left side: Secondary | ICD-10-CM | POA: Diagnosis not present

## 2022-11-01 DIAGNOSIS — G8929 Other chronic pain: Secondary | ICD-10-CM

## 2022-11-01 DIAGNOSIS — R29898 Other symptoms and signs involving the musculoskeletal system: Secondary | ICD-10-CM

## 2022-11-01 DIAGNOSIS — M6281 Muscle weakness (generalized): Secondary | ICD-10-CM

## 2022-11-01 NOTE — Therapy (Addendum)
OUTPATIENT PHYSICAL THERAPY TREATMENT   Patient Name: Krista [name] MRN: [id number] DOB:[birth year], 71 y.o., female Today's Date: 11/01/2022   PT End of Session - 11/01/22 1311     Visit Number 22    Authorization Type [employer]    Authorization - Number of Visits 16    Progress Note Due on Visit 20    PT Start Time 1315    PT Stop Time 9794    PT Time Calculation (min) 40 min    Activity Tolerance Patient tolerated treatment well    Behavior During Therapy WFL for tasks assessed/performed              Past Medical History:  Diagnosis Date   Allergy 1974   Anxiety 2021   COPD (chronic obstructive pulmonary disease) ([place])    per pt no issues, chest CT in epic 07-29-2021   DDD (degenerative disc disease), cervical    DDD (degenerative disc disease), lumbar    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    Depression    2021   Facial asymmetry, acquired 1974   left side paralysis   GERD (gastroesophageal reflux disease) 2021   H/O: Bell's palsy 1974   per pt during pregnancy , left side paralysis, told possible bell's palsy or stroke, after testing done had left mastoidectomy;  residual left side facialy asymmetry   History of basal cell carcinoma (BCC) excision    History of cerebral aneurysm 2010   s/p coiling  ;  per pt was an incidental finding on imaging   History of Clostridioides difficile infection 2010   treated   History of COVID-19 05/08/2021   positive result in care everywhere; per pt mild to moderate symptoms that resolved   History of Helicobacter pylori infection 2010   treated   Hyperlipidemia    Hypertension    followed by pcp   Lymphocytic colitis    followed by dr c. Shary Key (GI);  dx by biopsy 09-13-2017   Macular degeneration of both eyes    Non-intractable vomiting with nausea    followed by dr Shary Key   OA (osteoarthritis)    Osteoporosis    PONV (postoperative nausea and vomiting)     Spondylosis, unspecified    cervical and lumbar   Wears hearing aid in both ears    Past Surgical History:  Procedure Laterality Date   CARPAL TUNNEL RELEASE Bilateral    early 2000s   CATARACT EXTRACTION W/ INTRAOCULAR LENS IMPLANT Bilateral 2014   CEREBRAL ANEURYSM REPAIR  2010   endocerebral coiling   COLONOSCOPY  09/13/2017   COSMETIC SURGERY     [facility] Surgery on nose   DISTAL INTERPHALANGEAL JOINT FUSION Left 09/16/2021   Procedure: left small finger distal interphalangeal joint fusion;  Surgeon: [name], MD;  Location: [facility];  Service: Orthopedics;  Laterality: Left;  with MAC anesthesia   ESOPHAGOGASTRODUODENOSCOPY  09/22/2020   EYE SURGERY  2020   Blepharoplasty   FOOT SURGERY Bilateral    eary 2000s;  bilateral bunionectomy  and removal bone right 5th toe   FRACTURE SURGERY  1968   Shattered right ankle   MASTOIDECTOMY Left 1974   RADIOFREQUENCY ABLATION  09/01/2021   lumbar , left side   ROTATOR CUFF REPAIR Right 2014   SALPINGOOPHORECTOMY Bilateral 1995   w/ excision endometriosis via laparotomy   SPINE SURGERY  2021/2022   DDD, osteoarthritis   TONSILLECTOMY AND ADENOIDECTOMY  1970  TOTAL ABDOMINAL HYSTERECTOMY  1987   TRIGGER FINGER RELEASE Bilateral    early 2000s; one finger each hand   TUBAL LIGATION  1982   WRIST GANGLION EXCISION Left    2000s   Patient Active Problem List   Diagnosis Date Noted   Osteoporosis 11/05/2020   DDD (degenerative disc disease), cervical 07/06/2020   Facet arthritis of lumbar region 05/06/2020   Bilateral wrist pain 05/06/2020   Inflammatory arthritis 03/13/2020   Osteoarthritis of left little finger 07/31/2018   Acute bronchitis with COPD ([place]) 05/10/2018   Impacted cerumen of left ear 03/07/2018   History of left mastoidectomy 03/07/2018   Tympanosclerosis, left ear 03/07/2018   Cardiac risk counseling 09/05/2017   Essential hypertension 09/05/2017   Mixed hyperlipidemia 09/05/2017    History of Bell's palsy 08/15/2017   History of osteoporosis 08/15/2017   History of colon polyps 08/15/2017   History of nonmelanoma skin cancer 08/15/2017   Brain aneurysm 08/15/2017   Bilateral hearing loss 08/15/2017   H/O hysterectomy for benign disease 08/15/2017    PCP: [name]  REFERRING PROVIDER: [name]   REFERRING DIAG: M51.16 (ICD-10-CM) - Intervertebral disc disorders with radiculopathy, lumbar region  Rationale for Evaluation and Treatment Rehabilitation  THERAPY DIAG:  Chronic bilateral low back pain with left-sided sciatica  Other symptoms and signs involving the musculoskeletal system  Muscle weakness (generalized)  Acute bilateral low back pain without sciatica  ONSET DATE: 2-3 years  SUBJECTIVE:                                                                                                                                                                                           SUBJECTIVE STATEMENT: Pt reports she had to drive down to [place] for her brother. Pt notes her back is hurting again. Not able to sleep long at all. Pt has been feeling stressed. Pt does note she has been trying to do her exercises. Pt is to follow up with neurosurgeon.  PERTINENT HISTORY:  Has had PT in the past; history of multiple lumbar injections, ablations and blocks  PAIN:  Are you having pain? Yes: NPRS scale: 8/10 Pain location: Left low back Pain description: Throbbing pain, as it works to front of the hip it gets more painful Aggravating factors: Walking, house work Relieving factors: Heat/ice   PRECAUTIONS: None  WEIGHT BEARING RESTRICTIONS No  FALLS:  Has patient fallen in last 6 months? No  LIVING ENVIRONMENT: Lives with: lives with their spouse Lives in: House/apartment Stairs: Yes: External: 2 steps; on right going up Has following equipment at home: None  OCCUPATION: Retired  PLOF: Independent  PATIENT GOALS Decrease  pain   OBJECTIVE:   PATIENT SURVEYS:  FOTO 26; predicted 41   POSTURE:  R trunk lean, L iliac crest higher than R, R weight shift   (from eval)  PALPATION: Hypomobile with L ASIS PA mobs, TTP bilat glutes L>R, L QL, bilat piriformis. L PSIS PA mobs causes R hip spasm (from eval)  LUMBAR ROM:   Active  A/PROM  eval Active 10/11/22  Flexion 75% Feels pull   Extension 50% Increases pain   Right lateral flexion To knee feels pain   Left lateral flexion 3 inches above knee feels pull 1.5 inch above knee  Right rotation WFL   Left rotation Feels pull on back and L groin but WFL    (Blank rows = not tested)  LOWER EXTREMITY MMT  MMT Right eval Left eval Right  10/11/22 Left 10/11/22  Hip flexion _0 4+  Hip extension 3 3 4+ 4+  Hip abduction 3+ 3+ 5 5  Hip adduction      Hip internal rotation      Hip external rotation      Knee flexion 4 3+ 5 4+  Knee extension _1 Ankle dorsiflexion      Ankle plantarflexion      Ankle inversion      Ankle eversion       (Blank rows = not tested)    LUMBAR SPECIAL TESTS:  Straight leg raise test: L LE feels it in groin, Slump test: Positive, Single leg stance test: Positive, and FABER test: Positive on L; anteriorly tilted L inominate  FUNCTIONAL TESTS:  SLS:  5 sec on R, 3 sec on L ([place] noted bilat) (from eval)  8 sec on R, 16 sec on L, (10/17)  5 sec on R (22 sec on second rep), 30 sec on L on 10/11/22  5x STS 17 sec (10/17)  12 sec (11/16)    TODAY'S TREATMENT  11/01/22 THEREX Treadmill 1.5 mph x 5 min  Sitting Figure 4 stretch 2x30 sec Piriformis stretch 2x30 sec   Prone Hip ext 3x10 Child's pose x30 sec  Sidelying Hip abd 2x10  Standing Marching x10 focus on core/trunk stability L lateral hip shift x10  MANUAL THERAPY STM & TPR L glute, piriformis, QL, sacrum PA mobs L PSIS and lumbar transverse processes Skilled assessment and palpation for TPDN  Trigger Point Dry-Needling   Treatment instructions: Expect mild to moderate muscle soreness. S/S of pneumothorax if dry needled over a lung field, and to seek immediate medical attention should they occur. Patient verbalized understanding of these instructions and education.  Patient Consent Given: Yes Education handout provided: Previously provided Muscles treated: L glutes, piriformis, lumbar paraspinals Electrical stimulation performed: No Parameters: N/A Treatment response/outcome: Twitch response, palpable increase muscle length   10/20/22 THEREX Treadmill 2.0 mph x 5 min  Sitting Figure 4 stretch 2x30 sec bilat Piriformis stretch 2x30 sec bilat  Standing Resisted walking sports cord forward, L&R, back x10 each (cues to decrease trunk lean)  MANUAL THERAPY STM & TPR L glute, piriformis, QL, sacrum Skilled assessment and palpation for TPDN  Trigger Point Dry-Needling  Treatment instructions: Expect mild to moderate muscle soreness. S/S of pneumothorax if dry needled over a lung field, and to seek immediate medical attention should they occur. Patient verbalized understanding of these instructions and education.  Patient Consent Given: Yes Education handout provided: Previously provided Muscles treated: bilat glutes, piriformis Electrical stimulation performed: No Parameters: N/A  Treatment response/outcome: Twitch response, palpable increase muscle length   10/18/22 THEREX Treadmill 1.5 mph x 5 min  Sitting Hamstring stretch x30 sec bilat Figure 4 stretch 2x30 sec bil Piriformis 4 stretch 2x30 sec bil  Quadruped Child's pose x30 sec with lateral flexion x30 sec  Prone "I", "Y" 2x10 Row 2x10  Supine Knee bent to ext with PPT 2x8 (increase adduction on L) Butterfly stretch 2x30 sec  MANUAL THERAPY STM & TPR L glute, piriformis, lumbar paraspinals Grade II to III thoracolumbar ext mobs Skilled assessment and palpation for [facility]    10/11/22 THEREX Treadmill 1.5 mph x  71mn  Standing Lateral hip shift 2x30 sec SLS multiple reps R side Standing hip flexon RTB x 10 ea  Sitting Figure 4 stretch 2x30 sec bil Piriformis 4 stretch 2x30 sec bil On pball pelvic tilts x10 On pball hip flexion x10 On pball LAQ x10  Supine Butterfly stretch 2x30 sec SL bridge x 10 bil  Sidelying Clamshell with hip IR x10 on bil     PATIENT EDUCATION:  Education details: Discussed exam findings, POC, and initial HEP Person educated: Patient Education method: Explanation, Demonstration, and Handouts Education comprehension: verbalized understanding, returned demonstration, and needs further education   HOME EXERCISE PROGRAM: Access Code: [email or web address] URL: [email or web address] Date: 09/27/2022 Prepared by: GEstill BambergApril MThurnell Garbe Exercises - Supine Butterfly Groin Stretch  - 1 x daily - 7 x weekly - 2 sets - 30 sec hold - Supine Piriformis Stretch with Foot on Ground  - 1 x daily - 7 x weekly - 2 sets - 30 sec hold - Seated Hip Flexor Stretch  - 1 x daily - 7 x weekly - 2 sets - 30 sec hold - Right Standing Lateral Shift Correction at Wall - Repetitions  - 1 x daily - 7 x weekly - 3 sets - 10 sec hold - Sit to Stand  - 1 x daily - 7 x weekly - 2 sets - 10 reps - Standing Anti-Rotation Press with Anchored Resistance  - 1 x daily - 7 x weekly - 2 sets - 10 reps - 3 sec hold - Side Stepping with Resistance at Ankles and Counter Support  - 1 x daily - 7 x weekly - 2 sets - 10 reps - Single Leg Bridge  - 1 x daily - 7 x weekly - 2 sets - 10 reps - Clamshell with Resistance  - 1 x daily - 7 x weekly - 1-2 sets - 10 reps - Beginner Front Arm Support  - 1 x daily - 7 x weekly - 2 sets - 10 reps  ASSESSMENT:  CLINICAL IMPRESSION: Ms. NHenryettapresents with exacerbation of her back pain. Continued trigger points and tenderness in glutes and piriformis. Hypomobile in SI and L lumbar spine going into extension. Difficulty maintaining L SLS without rotation in  lumbar spine. Continued progression of hip strengthening as able.    OBJECTIVE IMPAIRMENTS Abnormal gait, decreased activity tolerance, decreased mobility, difficulty walking, decreased ROM, decreased strength, hypomobility, increased fascial restrictions, increased muscle spasms, impaired flexibility, improper body mechanics, postural dysfunction, and pain.   ACTIVITY LIMITATIONS lifting, bending, standing, sleeping, stairs, transfers, bed mobility, and locomotion level  PARTICIPATION LIMITATIONS: cleaning, laundry, shopping, and community activity  PERSONAL FACTORS Past/current experiences and Time since onset of injury/illness/exacerbation are also affecting patient's functional outcome.   REHAB POTENTIAL: Good  CLINICAL DECISION MAKING: Evolving/moderate complexity  EVALUATION COMPLEXITY: Moderate   GOALS: Goals reviewed with patient? Yes  SHORT TERM  GOALS: Target date: 09/19/2022  Pt will be ind with initial HEP Baseline: Goal status: MET  2.  Pt will demo improved L pelvic mobility during spring testing to feel equal with R Baseline:  Goal status: MET  3.  Pt will be able to demo at least 5 sec on L SLS to demo improving LE stability Baseline: 16 sec on L Goal status: MET  4.  Pt will be able to tolerate performing 5x STS without UEs with equal weight shift with pain </=5/10 to demo improving L LE functional strength Baseline: 0/10; 17 sec Goal status: MET    LONG TERM GOALS TARGET DATE: 11/15/2022   Pt will be ind with maintaining community and home wellness exercise program Baseline:  Goal status: IN PROGRESS  2.  Pt will demo L=R lateral lumbar flexion AROM for improved trunk posture and symmetry for standing balance Baseline: L low back remains more limited than R Goal status: IN PROGRESS  3.  Pt will have increased FOTO score to 41 Baseline: 26 Goal status: IN PROGRESS  4. Pt will report improved pain by >/= 50%  Goal status: NEW  PLAN: PT  FREQUENCY: 2x/week  PT DURATION: 8 weeks  PLANNED INTERVENTIONS: Therapeutic exercises, Therapeutic activity, Neuromuscular re-education, Balance training, Gait training, Patient/Family education, Self Care, Joint mobilization, Aquatic Therapy, Dry Needling, Electrical stimulation, Spinal mobilization, Cryotherapy, Moist heat, Taping, Ionotophoresis 25m/ml Dexamethasone, Manual therapy, and Re-evaluation.  PLAN FOR NEXT SESSION: Assess response to HEP. Manual therapy as indicated. Correct sacrum/pelvis alignment. Continue balance/stability   Roseann Kees April Ma L [place] PT, DPT 11/01/2022, 1:12 PM

## 2022-11-03 ENCOUNTER — Encounter: Payer: Medicare HMO | Admitting: Physical Therapy

## 2022-11-03 ENCOUNTER — Ambulatory Visit: Payer: Medicare HMO | Admitting: Physical Therapy

## 2022-11-03 ENCOUNTER — Encounter: Payer: Self-pay | Admitting: Physical Therapy

## 2022-11-03 DIAGNOSIS — M6281 Muscle weakness (generalized): Secondary | ICD-10-CM | POA: Diagnosis not present

## 2022-11-03 DIAGNOSIS — G8929 Other chronic pain: Secondary | ICD-10-CM | POA: Diagnosis not present

## 2022-11-03 DIAGNOSIS — R29898 Other symptoms and signs involving the musculoskeletal system: Secondary | ICD-10-CM

## 2022-11-03 DIAGNOSIS — M5442 Lumbago with sciatica, left side: Secondary | ICD-10-CM | POA: Diagnosis not present

## 2022-11-03 DIAGNOSIS — M545 Low back pain, unspecified: Secondary | ICD-10-CM | POA: Diagnosis not present

## 2022-11-03 NOTE — Therapy (Signed)
OUTPATIENT PHYSICAL THERAPY TREATMENT   Patient Name: Duana Benedict MRN: [id number] DOB:[birth year], 71 y.o., female Today's Date: 11/03/2022   PT End of Session - 11/03/22 1401     Visit Number 17    Date for PT Re-Evaluation 11/15/22    Authorization Type [employer]    PT Start Time 6294    PT Stop Time 1450    PT Time Calculation (min) 45 min    Activity Tolerance Patient tolerated treatment well    Behavior During Therapy WFL for tasks assessed/performed              Past Medical History:  Diagnosis Date   Allergy 1974   Anxiety 2021   COPD (chronic obstructive pulmonary disease) ([place])    per pt no issues, chest CT in epic 07-29-2021   DDD (degenerative disc disease), cervical    DDD (degenerative disc disease), lumbar    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    Depression    2021   Facial asymmetry, acquired 1974   left side paralysis   GERD (gastroesophageal reflux disease) 2021   H/O: Bell's palsy 1974   per pt during pregnancy , left side paralysis, told possible bell's palsy or stroke, after testing done had left mastoidectomy;  residual left side facialy asymmetry   History of basal cell carcinoma (BCC) excision    History of cerebral aneurysm 2010   s/p coiling  ;  per pt was an incidental finding on imaging   History of Clostridioides difficile infection 2010   treated   History of COVID-19 05/08/2021   positive result in care everywhere; per pt mild to moderate symptoms that resolved   History of Helicobacter pylori infection 2010   treated   Hyperlipidemia    Hypertension    followed by pcp   Lymphocytic colitis    followed by dr c. Shary Key (GI);  dx by biopsy 09-13-2017   Macular degeneration of both eyes    Non-intractable vomiting with nausea    followed by dr Shary Key   OA (osteoarthritis)    Osteoporosis    PONV (postoperative nausea and vomiting)    Spondylosis, unspecified    cervical  and lumbar   Wears hearing aid in both ears    Past Surgical History:  Procedure Laterality Date   CARPAL TUNNEL RELEASE Bilateral    early 2000s   CATARACT EXTRACTION W/ INTRAOCULAR LENS IMPLANT Bilateral 2014   CEREBRAL ANEURYSM REPAIR  2010   endocerebral coiling   COLONOSCOPY  09/13/2017   COSMETIC SURGERY     [facility] Surgery on nose   DISTAL INTERPHALANGEAL JOINT FUSION Left 09/16/2021   Procedure: left small finger distal interphalangeal joint fusion;  Surgeon: [name], MD;  Location: [facility];  Service: Orthopedics;  Laterality: Left;  with MAC anesthesia   ESOPHAGOGASTRODUODENOSCOPY  09/22/2020   EYE SURGERY  2020   Blepharoplasty   FOOT SURGERY Bilateral    eary 2000s;  bilateral bunionectomy  and removal bone right 5th toe   FRACTURE SURGERY  1968   Shattered right ankle   MASTOIDECTOMY Left 1974   RADIOFREQUENCY ABLATION  09/01/2021   lumbar , left side   ROTATOR CUFF REPAIR Right 2014   SALPINGOOPHORECTOMY Bilateral 1995   w/ excision endometriosis via laparotomy   SPINE SURGERY  2021/2022   DDD, osteoarthritis   TONSILLECTOMY AND ADENOIDECTOMY  1970   TOTAL ABDOMINAL HYSTERECTOMY  1987   TRIGGER FINGER RELEASE  Bilateral    early 2000s; one finger each hand   TUBAL LIGATION  1982   WRIST GANGLION EXCISION Left    2000s   Patient Active Problem List   Diagnosis Date Noted   Osteoporosis 11/05/2020   DDD (degenerative disc disease), cervical 07/06/2020   Facet arthritis of lumbar region 05/06/2020   Bilateral wrist pain 05/06/2020   Inflammatory arthritis 03/13/2020   Osteoarthritis of left little finger 07/31/2018   Acute bronchitis with COPD ([place]) 05/10/2018   Impacted cerumen of left ear 03/07/2018   History of left mastoidectomy 03/07/2018   Tympanosclerosis, left ear 03/07/2018   Cardiac risk counseling 09/05/2017   Essential hypertension 09/05/2017   Mixed hyperlipidemia 09/05/2017   History of Bell's palsy 08/15/2017    History of osteoporosis 08/15/2017   History of colon polyps 08/15/2017   History of nonmelanoma skin cancer 08/15/2017   Brain aneurysm 08/15/2017   Bilateral hearing loss 08/15/2017   H/O hysterectomy for benign disease 08/15/2017    PCP: [name]  REFERRING PROVIDER: [name]   REFERRING DIAG: M51.16 (ICD-10-CM) - Intervertebral disc disorders with radiculopathy, lumbar region  Rationale for Evaluation and Treatment Rehabilitation  THERAPY DIAG:  Chronic bilateral low back pain with left-sided sciatica  Other symptoms and signs involving the musculoskeletal system  Muscle weakness (generalized)  Acute bilateral low back pain without sciatica  ONSET DATE: 2-3 years  SUBJECTIVE:                                                                                                                                                                                           SUBJECTIVE STATEMENT: Back on her gabapentin and sleeping better now. Been having cramps in her feet for the past week.  PERTINENT HISTORY:  Has had PT in the past; history of multiple lumbar injections, ablations and blocks  PAIN:  Are you having pain? Yes: NPRS scale: 8/10 Pain location: Left low back Pain description: Throbbing pain, as it works to front of the hip it gets more painful Aggravating factors: Walking, house work Relieving factors: Heat/ice   PRECAUTIONS: None  LIVING ENVIRONMENT: Lives with: lives with their spouse Lives in: House/apartment Stairs: Yes: External: 2 steps; on right going up Has following equipment at home: None  OCCUPATION: Retired  PLOF: Independent  PATIENT GOALS Decrease pain   OBJECTIVE:   PATIENT SURVEYS:  FOTO 26; predicted 41   POSTURE:  R trunk lean, L iliac crest higher than R, R weight shift   (from eval)  PALPATION: Hypomobile with L ASIS PA mobs, TTP bilat glutes L>R, L QL, bilat piriformis. L PSIS PA mobs causes  R hip spasm (from  eval)  LUMBAR ROM:   Active  A/PROM  eval Active 10/11/22  Flexion 75% Feels pull   Extension 50% Increases pain   Right lateral flexion To knee feels pain   Left lateral flexion 3 inches above knee feels pull 1.5 inch above knee  Right rotation WFL   Left rotation Feels pull on back and L groin but WFL    (Blank rows = not tested)  LOWER EXTREMITY MMT  MMT Right eval Left eval Right  10/11/22 Left 10/11/22  Hip flexion _0 4+  Hip extension 3 3 4+ 4+  Hip abduction 3+ 3+ 5 5  Hip adduction      Hip internal rotation      Hip external rotation      Knee flexion 4 3+ 5 4+  Knee extension _1 Ankle dorsiflexion      Ankle plantarflexion      Ankle inversion      Ankle eversion       (Blank rows = not tested)    LUMBAR SPECIAL TESTS:  Straight leg raise test: L LE feels it in groin, Slump test: Positive, Single leg stance test: Positive, and FABER test: Positive on L; anteriorly tilted L inominate  FUNCTIONAL TESTS:  SLS:  5 sec on R, 3 sec on L ([place] noted bilat) (from eval)  8 sec on R, 16 sec on L, (10/17)  5 sec on R (22 sec on second rep), 30 sec on L on 10/11/22  5x STS 17 sec (10/17)  12 sec (11/16)    TODAY'S TREATMENT  11/03/22 THEREX Treadmill 1.5 mph x 5 min  Sitting Seated stretch for toe extension x 30 sec ea Figure 4 stretch 2x30 sec Piriformis stretch 2x30 sec   Prone Hip ext 2x10 over 1 pillow Child's pose 2x30 sec, then one to the left x 30 sec  Sidelying Hip abd 2x10  Supine  Bridge 2x10  Standing Pallof press (one green TB) x 10 ea Marching x10 focus on core/trunk stability L lateral hip shift x10, then 1 x 30 sec hold Self traction at sink x 20 sec (felt in bil LE) Runner's stretch bil x 30 sec   11/01/22 THEREX Treadmill 1.5 mph x 5 min  Sitting Figure 4 stretch 2x30 sec Piriformis stretch 2x30 sec   Prone Hip ext 3x10 Child's pose x30 sec  Sidelying Hip abd 2x10  Standing Marching x10 focus  on core/trunk stability L lateral hip shift x10  MANUAL THERAPY STM & TPR L glute, piriformis, QL, sacrum PA mobs L PSIS and lumbar transverse processes Skilled assessment and palpation for TPDN  Trigger Point Dry-Needling  Treatment instructions: Expect mild to moderate muscle soreness. S/S of pneumothorax if dry needled over a lung field, and to seek immediate medical attention should they occur. Patient verbalized understanding of these instructions and education.  Patient Consent Given: Yes Education handout provided: Previously provided Muscles treated: L glutes, piriformis, lumbar paraspinals Electrical stimulation performed: No Parameters: N/A Treatment response/outcome: Twitch response, palpable increase muscle length   10/20/22 THEREX Treadmill 2.0 mph x 5 min  Sitting Figure 4 stretch 2x30 sec bilat Piriformis stretch 2x30 sec bilat  Standing Resisted walking sports cord forward, L&R, back x10 each (cues to decrease trunk lean)  MANUAL THERAPY STM & TPR L glute, piriformis, QL, sacrum Skilled assessment and palpation for TPDN  Trigger Point Dry-Needling  Treatment instructions: Expect mild to moderate muscle soreness.  S/S of pneumothorax if dry needled over a lung field, and to seek immediate medical attention should they occur. Patient verbalized understanding of these instructions and education.  Patient Consent Given: Yes Education handout provided: Previously provided Muscles treated: bilat glutes, piriformis Electrical stimulation performed: No Parameters: N/A Treatment response/outcome: Twitch response, palpable increase muscle length   PATIENT EDUCATION:  Education details: Discussed exam findings, POC, and initial HEP Person educated: Patient Education method: Explanation, Demonstration, and Handouts Education comprehension: verbalized understanding, returned demonstration, and needs further education   HOME EXERCISE PROGRAM: Access Code:  [email or web address] URL: [email or web address] Date: 11/03/2022 Prepared by: Almyra Free  Exercises - Supine Butterfly Groin Stretch  - 1 x daily - 7 x weekly - 2 sets - 30 sec hold - Supine Piriformis Stretch with Foot on Ground  - 1 x daily - 7 x weekly - 2 sets - 30 sec hold - Seated Hip Flexor Stretch  - 1 x daily - 7 x weekly - 2 sets - 30 sec hold - Right Standing Lateral Shift Correction at Wall - Repetitions  - 1 x daily - 7 x weekly - 3 sets - 10 sec hold - Standing Anti-Rotation Press with Anchored Resistance  - 1 x daily - 7 x weekly - 2 sets - 10 reps - 3 sec hold - Single Leg Bridge  - 1 x daily - 7 x weekly - 2 sets - 10 reps - Standing Glute Med Mobilization with Small Ball on Wall  - 1 x daily - 7 x weekly - 2 sets - 30 sec hold - Glute Med Wall Lean  - 1 x daily - 7 x weekly - 1 sets - 10 reps - 5 sec hold - Sidelying Hip Abduction  - 1 x daily - 7 x weekly - 2 sets - 10 reps - Child's Pose Stretch  - 1 x daily - 7 x weekly - 2 sets - 10 reps - Gastroc Stretch on Wall  - 2 x daily - 7 x weekly - 1 sets - 3 reps - 30-60 sec hold - Standing Toe Dorsiflexion Stretch  - 2 x daily - 7 x weekly - 1 sets - 3 reps - 30 sec hold  ASSESSMENT:  CLINICAL IMPRESSION: Rosalva was able to tolerate TE well today with only mild reports of increased pain with child's pose (in adductors) and bil LE pain with self traction at sink. Able to tolerate anti-rotation today. Added calf and toe stretches to HEP to address reports of foot cramping.  MD note for appt on Thursday 11/10/22.    OBJECTIVE IMPAIRMENTS Abnormal gait, decreased activity tolerance, decreased mobility, difficulty walking, decreased ROM, decreased strength, hypomobility, increased fascial restrictions, increased muscle spasms, impaired flexibility, improper body mechanics, postural dysfunction, and pain.   ACTIVITY LIMITATIONS lifting, bending, standing, sleeping, stairs, transfers, bed mobility, and locomotion level  PARTICIPATION  LIMITATIONS: cleaning, laundry, shopping, and community activity  PERSONAL FACTORS Past/current experiences and Time since onset of injury/illness/exacerbation are also affecting patient's functional outcome.   REHAB POTENTIAL: Good  CLINICAL DECISION MAKING: Evolving/moderate complexity  EVALUATION COMPLEXITY: Moderate   GOALS: Goals reviewed with patient? Yes  SHORT TERM GOALS: Target date: 09/19/2022  Pt will be ind with initial HEP Baseline: Goal status: MET  2.  Pt will demo improved L pelvic mobility during spring testing to feel equal with R Baseline:  Goal status: MET  3.  Pt will be able to demo at least 5 sec on L SLS to demo improving  LE stability Baseline: 16 sec on L Goal status: MET  4.  Pt will be able to tolerate performing 5x STS without UEs with equal weight shift with pain </=5/10 to demo improving L LE functional strength Baseline: 0/10; 17 sec Goal status: MET    LONG TERM GOALS TARGET DATE: 11/15/2022   Pt will be ind with maintaining community and home wellness exercise program Baseline:  Goal status: IN PROGRESS  2.  Pt will demo L=R lateral lumbar flexion AROM for improved trunk posture and symmetry for standing balance Baseline: L low back remains more limited than R Goal status: IN PROGRESS  3.  Pt will have increased FOTO score to 41 Baseline: 26 Goal status: IN PROGRESS  4. Pt will report improved pain by >/= 50%  Goal status: NEW  PLAN: PT FREQUENCY: 2x/week  PT DURATION: 8 weeks  PLANNED INTERVENTIONS: Therapeutic exercises, Therapeutic activity, Neuromuscular re-education, Balance training, Gait training, Patient/Family education, Self Care, Joint mobilization, Aquatic Therapy, Dry Needling, Electrical stimulation, Spinal mobilization, Cryotherapy, Moist heat, Taping, Ionotophoresis 79m/ml Dexamethasone, Manual therapy, and Re-evaluation.  PLAN FOR NEXT SESSION: MD note. Assess response to HEP. Manual therapy as indicated.  Correct sacrum/pelvis alignment. Continue balance/stability   Janete Quilling, PT 11/03/2022, 2:55 PM

## 2022-11-08 ENCOUNTER — Encounter: Payer: Self-pay | Admitting: Physical Therapy

## 2022-11-08 ENCOUNTER — Ambulatory Visit: Payer: Medicare HMO | Attending: Neurosurgery | Admitting: Physical Therapy

## 2022-11-08 DIAGNOSIS — M545 Low back pain, unspecified: Secondary | ICD-10-CM | POA: Insufficient documentation

## 2022-11-08 DIAGNOSIS — M6281 Muscle weakness (generalized): Secondary | ICD-10-CM | POA: Diagnosis not present

## 2022-11-08 DIAGNOSIS — R29898 Other symptoms and signs involving the musculoskeletal system: Secondary | ICD-10-CM | POA: Insufficient documentation

## 2022-11-08 DIAGNOSIS — G8929 Other chronic pain: Secondary | ICD-10-CM | POA: Diagnosis not present

## 2022-11-08 DIAGNOSIS — M5442 Lumbago with sciatica, left side: Secondary | ICD-10-CM | POA: Insufficient documentation

## 2022-11-08 NOTE — Therapy (Signed)
OUTPATIENT PHYSICAL THERAPY TREATMENT   Patient Name: Krista [name] MRN: [id number] DOB:09-07-1951, 71 y.o., female Today's Date: 11/08/2022   PT End of Session - 11/08/22 1104     Visit Number 18    Date for PT Re-Evaluation 11/15/22    Authorization Type [employer]    PT Start Time 1104    PT Stop Time 1150    PT Time Calculation (min) 46 min    Activity Tolerance Patient tolerated treatment well    Behavior During Therapy WFL for tasks assessed/performed               Past Medical History:  Diagnosis Date   Allergy 1974   Anxiety 2021   COPD (chronic obstructive pulmonary disease) ([place])    per pt no issues, chest CT in epic 07-29-2021   DDD (degenerative disc disease), cervical    DDD (degenerative disc disease), lumbar    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    Depression    2021   Facial asymmetry, acquired 1974   left side paralysis   GERD (gastroesophageal reflux disease) 2021   H/O: Bell's palsy 1974   per pt during pregnancy , left side paralysis, told possible bell's palsy or stroke, after testing done had left mastoidectomy;  residual left side facialy asymmetry   History of basal cell carcinoma (BCC) excision    History of cerebral aneurysm 2010   s/p coiling  ;  per pt was an incidental finding on imaging   History of Clostridioides difficile infection 2010   treated   History of COVID-19 05/08/2021   positive result in care everywhere; per pt mild to moderate symptoms that resolved   History of Helicobacter pylori infection 2010   treated   Hyperlipidemia    Hypertension    followed by pcp   Lymphocytic colitis    followed by dr c. Shary Key (GI);  dx by biopsy 09-13-2017   Macular degeneration of both eyes    Non-intractable vomiting with nausea    followed by dr Shary Key   OA (osteoarthritis)    Osteoporosis    PONV (postoperative nausea and vomiting)    Spondylosis, unspecified     cervical and lumbar   Wears hearing aid in both ears    Past Surgical History:  Procedure Laterality Date   CARPAL TUNNEL RELEASE Bilateral    early 2000s   CATARACT EXTRACTION W/ INTRAOCULAR LENS IMPLANT Bilateral 2014   CEREBRAL ANEURYSM REPAIR  2010   endocerebral coiling   COLONOSCOPY  09/13/2017   COSMETIC SURGERY     [facility] Surgery on nose   DISTAL INTERPHALANGEAL JOINT FUSION Left 09/16/2021   Procedure: left small finger distal interphalangeal joint fusion;  Surgeon: [name], MD;  Location: [facility];  Service: Orthopedics;  Laterality: Left;  with MAC anesthesia   ESOPHAGOGASTRODUODENOSCOPY  09/22/2020   EYE SURGERY  2020   Blepharoplasty   FOOT SURGERY Bilateral    eary 2000s;  bilateral bunionectomy  and removal bone right 5th toe   FRACTURE SURGERY  1968   Shattered right ankle   MASTOIDECTOMY Left 1974   RADIOFREQUENCY ABLATION  09/01/2021   lumbar , left side   ROTATOR CUFF REPAIR Right 2014   SALPINGOOPHORECTOMY Bilateral 1995   w/ excision endometriosis via laparotomy   SPINE SURGERY  2021/2022   DDD, osteoarthritis   TONSILLECTOMY AND ADENOIDECTOMY  1970   TOTAL ABDOMINAL HYSTERECTOMY  1987   TRIGGER FINGER  RELEASE Bilateral    early 2000s; one finger each hand   TUBAL LIGATION  1982   WRIST GANGLION EXCISION Left    2000s   Patient Active Problem List   Diagnosis Date Noted   Osteoporosis 11/05/2020   DDD (degenerative disc disease), cervical 07/06/2020   Facet arthritis of lumbar region 05/06/2020   Bilateral wrist pain 05/06/2020   Inflammatory arthritis 03/13/2020   Osteoarthritis of left little finger 07/31/2018   Acute bronchitis with COPD ([place]) 05/10/2018   Impacted cerumen of left ear 03/07/2018   History of left mastoidectomy 03/07/2018   Tympanosclerosis, left ear 03/07/2018   Cardiac risk counseling 09/05/2017   Essential hypertension 09/05/2017   Mixed hyperlipidemia 09/05/2017   History of Bell's palsy 08/15/2017    History of osteoporosis 08/15/2017   History of colon polyps 08/15/2017   History of nonmelanoma skin cancer 08/15/2017   Brain aneurysm 08/15/2017   Bilateral hearing loss 08/15/2017   H/O hysterectomy for benign disease 08/15/2017    PCP: [name]  REFERRING PROVIDER: [name]   REFERRING DIAG: M51.16 (ICD-10-CM) - Intervertebral disc disorders with radiculopathy, lumbar region  Rationale for Evaluation and Treatment Rehabilitation  THERAPY DIAG:  Chronic bilateral low back pain with left-sided sciatica  Other symptoms and signs involving the musculoskeletal system  Muscle weakness (generalized)  Acute bilateral low back pain without sciatica  ONSET DATE: 2-3 years  SUBJECTIVE:                                                                                                                                                                                           SUBJECTIVE STATEMENT: Pt reports increased pain this morning. Notes increased internal rotation of L LE during amb (reports it started yesterday).   PERTINENT HISTORY:  Has had PT in the past; history of multiple lumbar injections, ablations and blocks  PAIN:  Are you having pain? Yes: NPRS scale: 8/10 Pain location: Left low back Pain description: Throbbing pain, as it works to front of the hip it gets more painful Aggravating factors: Walking, house work Relieving factors: Heat/ice   PRECAUTIONS: None  LIVING ENVIRONMENT: Lives with: lives with their spouse Lives in: House/apartment Stairs: Yes: External: 2 steps; on right going up Has following equipment at home: None  OCCUPATION: Retired  PLOF: Independent  PATIENT GOALS Decrease pain   OBJECTIVE:   PATIENT SURVEYS:  FOTO 26; predicted 41  POSTURE:  R trunk lean, L iliac crest higher than R, R weight shift   (from eval)  PALPATION: Hypomobile with L ASIS PA mobs, TTP bilat glutes L>R, L QL, bilat piriformis. L PSIS PA  mobs  causes R hip spasm (from eval)  LUMBAR ROM:   Active  A/PROM  eval Active 10/11/22  Flexion 75% Feels pull   Extension 50% Increases pain   Right lateral flexion To knee feels pain   Left lateral flexion 3 inches above knee feels pull 1.5 inch above knee  Right rotation WFL   Left rotation Feels pull on back and L groin but WFL    (Blank rows = not tested)  LOWER EXTREMITY MMT  MMT Right eval Left eval Right  10/11/22 Left 10/11/22  Hip flexion _0 4+  Hip extension 3 3 4+ 4+  Hip abduction 3+ 3+ 5 5  Hip adduction      Hip internal rotation      Hip external rotation      Knee flexion 4 3+ 5 4+  Knee extension _1 Ankle dorsiflexion      Ankle plantarflexion      Ankle inversion      Ankle eversion       (Blank rows = not tested)    LUMBAR SPECIAL TESTS:  Straight leg raise test: L LE feels it in groin, Slump test: Positive, Single leg stance test: Positive, and FABER test: Positive on L; anteriorly tilted L inominate  FUNCTIONAL TESTS:  SLS:  5 sec on R, 3 sec on L ([place] noted bilat) (from eval)  8 sec on R, 16 sec on L, (10/17)  5 sec on R (22 sec on second rep), 30 sec on L on 10/11/22  5x STS 17 sec (10/17)  12 sec (11/16)    TODAY'S TREATMENT  11/08/22 THEREX Treadmill 1.2 mph x 4 min Supine LTR 2x30 sec R&L Supine Figure 4 stretch x30 sec Thomas stretch x30 sec on R Prone hip IR/ER x10 R&L PPT 10x 5 sec PPT + marching x10 PPT with hip abd iso against belt x10 Hip abd iso against belt + bridge x10 Self mobilization for anterior ilium in standing x30 sec Knee to chest on L x 30 sec  MANUAL THERAPY L PSIS PA mobs, R sacral PA mobs STM & TPR L glute   11/03/22 THEREX Treadmill 1.5 mph x 5 min  Sitting Seated stretch for toe extension x 30 sec ea Figure 4 stretch 2x30 sec Piriformis stretch 2x30 sec   Prone Hip ext 2x10 over 1 pillow Child's pose 2x30 sec, then one to the left x 30 sec  Sidelying Hip abd 2x10  Supine   Bridge 2x10  Standing Pallof press (one green TB) x 10 ea Marching x10 focus on core/trunk stability L lateral hip shift x10, then 1 x 30 sec hold Self traction at sink x 20 sec (felt in bil LE) Runner's stretch bil x 30 sec     PATIENT EDUCATION:  Education details: Discussed exam findings, POC, and initial HEP Person educated: Patient Education method: Explanation, Demonstration, and Handouts Education comprehension: verbalized understanding, returned demonstration, and needs further education   HOME EXERCISE PROGRAM: Access Code: [email or web address] URL: [email or web address] Date: 11/03/2022 Prepared by: Almyra Free  Exercises - Supine Butterfly Groin Stretch  - 1 x daily - 7 x weekly - 2 sets - 30 sec hold - Supine Piriformis Stretch with Foot on Ground  - 1 x daily - 7 x weekly - 2 sets - 30 sec hold - Seated Hip Flexor Stretch  - 1 x daily - 7 x weekly - 2 sets - 30 sec hold - Right Standing Lateral Shift  Correction at [place]  - 1 x daily - 7 x weekly - 3 sets - 10 sec hold - Standing Anti-Rotation Press with Anchored Resistance  - 1 x daily - 7 x weekly - 2 sets - 10 reps - 3 sec hold - Single Leg Bridge  - 1 x daily - 7 x weekly - 2 sets - 10 reps - Standing Glute Med Mobilization with Small Ball on Wall  - 1 x daily - 7 x weekly - 2 sets - 30 sec hold - Glute Med Wall Lean  - 1 x daily - 7 x weekly - 1 sets - 10 reps - 5 sec hold - Sidelying Hip Abduction  - 1 x daily - 7 x weekly - 2 sets - 10 reps - Child's Pose Stretch  - 1 x daily - 7 x weekly - 2 sets - 10 reps - Gastroc Stretch on Wall  - 2 x daily - 7 x weekly - 1 sets - 3 reps - 30-60 sec hold - Standing Toe Dorsiflexion Stretch  - 2 x daily - 7 x weekly - 1 sets - 3 reps - 30 sec hold  ASSESSMENT:  CLINICAL IMPRESSION: Pt appears to have L anteriorly rotated ilium this session causing increased L foot internal rotation. Provided stretches and manual work to correct this rotation. Continued core  and hip strengthening with good pt tolerance.  She continues to make slow gains with PT; however, continues to have L glute trigger point. Pt will be following up with neurosurgeon this Thursday.    OBJECTIVE IMPAIRMENTS Abnormal gait, decreased activity tolerance, decreased mobility, difficulty walking, decreased ROM, decreased strength, hypomobility, increased fascial restrictions, increased muscle spasms, impaired flexibility, improper body mechanics, postural dysfunction, and pain.   ACTIVITY LIMITATIONS lifting, bending, standing, sleeping, stairs, transfers, bed mobility, and locomotion level  PARTICIPATION LIMITATIONS: cleaning, laundry, shopping, and community activity  PERSONAL FACTORS Past/current experiences and Time since onset of injury/illness/exacerbation are also affecting patient's functional outcome.   REHAB POTENTIAL: Good  CLINICAL DECISION MAKING: Evolving/moderate complexity  EVALUATION COMPLEXITY: Moderate   GOALS: Goals reviewed with patient? Yes  SHORT TERM GOALS: Target date: 09/19/2022  Pt will be ind with initial HEP Baseline: Goal status: MET  2.  Pt will demo improved L pelvic mobility during spring testing to feel equal with R Baseline:  Goal status: MET  3.  Pt will be able to demo at least 5 sec on L SLS to demo improving LE stability Baseline: 16 sec on L Goal status: MET  4.  Pt will be able to tolerate performing 5x STS without UEs with equal weight shift with pain </=5/10 to demo improving L LE functional strength Baseline: 0/10; 17 sec Goal status: MET    LONG TERM GOALS TARGET DATE: 11/15/2022   Pt will be ind with maintaining community and home wellness exercise program Baseline:  Goal status: IN PROGRESS  2.  Pt will demo L=R lateral lumbar flexion AROM for improved trunk posture and symmetry for standing balance Baseline: L low back remains more limited than R Goal status: IN PROGRESS  3.  Pt will have increased FOTO score  to 41 Baseline: 26 Goal status: IN PROGRESS  4. Pt will report improved pain by >/= 50%  Goal status: NEW  PLAN: PT FREQUENCY: 2x/week  PT DURATION: 8 weeks  PLANNED INTERVENTIONS: Therapeutic exercises, Therapeutic activity, Neuromuscular re-education, Balance training, Gait training, Patient/Family education, Self Care, Joint mobilization, Aquatic Therapy, Dry Needling, Electrical  stimulation, Spinal mobilization, Cryotherapy, Moist heat, Taping, Ionotophoresis 15m/ml Dexamethasone, Manual therapy, and Re-evaluation.  PLAN FOR NEXT SESSION: Assess response to HEP. Manual therapy as indicated. Correct sacrum/pelvis alignment. Continue balance/stability   Devone Bonilla April Ma L Bassam Dresch, PT 11/08/2022, 12:47 PM

## 2022-11-10 ENCOUNTER — Ambulatory Visit: Payer: Medicare HMO | Admitting: Physical Therapy

## 2022-11-10 ENCOUNTER — Encounter: Payer: Self-pay | Admitting: Physical Therapy

## 2022-11-10 DIAGNOSIS — M545 Low back pain, unspecified: Secondary | ICD-10-CM

## 2022-11-10 DIAGNOSIS — M6281 Muscle weakness (generalized): Secondary | ICD-10-CM

## 2022-11-10 DIAGNOSIS — M5442 Lumbago with sciatica, left side: Secondary | ICD-10-CM | POA: Diagnosis not present

## 2022-11-10 DIAGNOSIS — M5116 Intervertebral disc disorders with radiculopathy, lumbar region: Secondary | ICD-10-CM | POA: Diagnosis not present

## 2022-11-10 DIAGNOSIS — G8929 Other chronic pain: Secondary | ICD-10-CM

## 2022-11-10 DIAGNOSIS — R29898 Other symptoms and signs involving the musculoskeletal system: Secondary | ICD-10-CM | POA: Diagnosis not present

## 2022-11-10 DIAGNOSIS — M48062 Spinal stenosis, lumbar region with neurogenic claudication: Secondary | ICD-10-CM | POA: Diagnosis not present

## 2022-11-10 NOTE — Therapy (Signed)
OUTPATIENT PHYSICAL THERAPY TREATMENT AND DISCHARGE   Patient Name: Krista [name] MRN: [id number] DOB:[birth year], 71 y.o., female Today's Date: 11/11/2022  PHYSICAL THERAPY DISCHARGE SUMMARY  Visits from Start of Care: 19  Current functional level related to goals / functional outcomes: Pain remains after recent exacerbation after a long drive to [place]   Remaining deficits: See below   Education / Equipment: See below   Patient agrees to discharge. Patient goals were partially met. Patient is being discharged due to  pt pursuing surgery.    PT End of Session - 11/10/22 1320     Visit Number 19    Date for PT Re-Evaluation 11/15/22    Authorization Type [employer]    PT Start Time 1320    PT Stop Time 1400    PT Time Calculation (min) 40 min    Activity Tolerance Patient tolerated treatment well    Behavior During Therapy WFL for tasks assessed/performed               Past Medical History:  Diagnosis Date   Allergy 1974   Anxiety 2021   COPD (chronic obstructive pulmonary disease) ([place])    per pt no issues, chest CT in epic 07-29-2021   DDD (degenerative disc disease), cervical    DDD (degenerative disc disease), lumbar    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    Depression    2021   Facial asymmetry, acquired 1974   left side paralysis   GERD (gastroesophageal reflux disease) 2021   H/O: Bell's palsy 1974   per pt during pregnancy , left side paralysis, told possible bell's palsy or stroke, after testing done had left mastoidectomy;  residual left side facialy asymmetry   History of basal cell carcinoma (BCC) excision    History of cerebral aneurysm 2010   s/p coiling  ;  per pt was an incidental finding on imaging   History of Clostridioides difficile infection 2010   treated   History of COVID-19 05/08/2021   positive result in care everywhere; per pt mild to moderate symptoms that resolved    History of Helicobacter pylori infection 2010   treated   Hyperlipidemia    Hypertension    followed by pcp   Lymphocytic colitis    followed by dr c. Shary Key (GI);  dx by biopsy 09-13-2017   Macular degeneration of both eyes    Non-intractable vomiting with nausea    followed by dr Shary Key   OA (osteoarthritis)    Osteoporosis    PONV (postoperative nausea and vomiting)    Spondylosis, unspecified    cervical and lumbar   Wears hearing aid in both ears    Past Surgical History:  Procedure Laterality Date   CARPAL TUNNEL RELEASE Bilateral    early 2000s   CATARACT EXTRACTION W/ INTRAOCULAR LENS IMPLANT Bilateral 2014   CEREBRAL ANEURYSM REPAIR  2010   endocerebral coiling   COLONOSCOPY  09/13/2017   COSMETIC SURGERY     [facility] Surgery on nose   DISTAL INTERPHALANGEAL JOINT FUSION Left 09/16/2021   Procedure: left small finger distal interphalangeal joint fusion;  Surgeon: [name], MD;  Location: [facility];  Service: Orthopedics;  Laterality: Left;  with MAC anesthesia   ESOPHAGOGASTRODUODENOSCOPY  09/22/2020   EYE SURGERY  2020   Blepharoplasty   FOOT SURGERY Bilateral    eary 2000s;  bilateral bunionectomy  and removal bone right 5th toe   FRACTURE SURGERY  1968   Shattered right ankle   MASTOIDECTOMY Left [place]  09/01/2021   lumbar , left side   ROTATOR CUFF REPAIR Right 2014   SALPINGOOPHORECTOMY Bilateral 1995   w/ excision endometriosis via laparotomy   SPINE SURGERY  2021/2022   DDD, osteoarthritis   TONSILLECTOMY AND ADENOIDECTOMY  1970   TOTAL ABDOMINAL HYSTERECTOMY  1987   TRIGGER FINGER RELEASE Bilateral    early 2000s; one finger each hand   TUBAL LIGATION  1982   WRIST GANGLION EXCISION Left    2000s   Patient Active Problem List   Diagnosis Date Noted   Osteoporosis 11/05/2020   DDD (degenerative disc disease), cervical 07/06/2020   Facet arthritis of lumbar region 05/06/2020   Bilateral wrist pain  05/06/2020   Inflammatory arthritis 03/13/2020   Osteoarthritis of left little finger 07/31/2018   Acute bronchitis with COPD ([place]) 05/10/2018   Impacted cerumen of left ear 03/07/2018   History of left mastoidectomy 03/07/2018   Tympanosclerosis, left ear 03/07/2018   Cardiac risk counseling 09/05/2017   Essential hypertension 09/05/2017   Mixed hyperlipidemia 09/05/2017   History of Bell's palsy 08/15/2017   History of osteoporosis 08/15/2017   History of colon polyps 08/15/2017   History of nonmelanoma skin cancer 08/15/2017   Brain aneurysm 08/15/2017   Bilateral hearing loss 08/15/2017   H/O hysterectomy for benign disease 08/15/2017    PCP: [name]  REFERRING PROVIDER: [name]   REFERRING DIAG: M51.16 (ICD-10-CM) - Intervertebral disc disorders with radiculopathy, lumbar region  Rationale for Evaluation and Treatment Rehabilitation  THERAPY DIAG:  Chronic bilateral low back pain with left-sided sciatica  Other symptoms and signs involving the musculoskeletal system  Muscle weakness (generalized)  Acute bilateral low back pain without sciatica  ONSET DATE: 2-3 years  SUBJECTIVE:                                                                                                                                                                                           SUBJECTIVE STATEMENT: Pt states she saw neurosurgeon who states that her L3/L4 is too far gone and will need surgery.   PERTINENT HISTORY:  Has had PT in the past; history of multiple lumbar injections, ablations and blocks  PAIN:  Are you having pain? Yes: NPRS scale: 8/10 Pain location: Left low back Pain description: Throbbing pain, as it works to front of the hip it gets more painful Aggravating factors: Walking, house work Relieving factors: Heat/ice   PRECAUTIONS: None  LIVING ENVIRONMENT: Lives with: lives with their spouse Lives in: House/apartment Stairs: Yes: External: 2  steps; on  right going up Has following equipment at home: None  OCCUPATION: Retired  PLOF: Independent  PATIENT GOALS Decrease pain   OBJECTIVE:   PATIENT SURVEYS:  FOTO 26; predicted 41  POSTURE:  R trunk lean, L iliac crest higher than R, R weight shift   (from eval)  PALPATION: Hypomobile with L ASIS PA mobs, TTP bilat glutes L>R, L QL, bilat piriformis. L PSIS PA mobs causes R hip spasm (from eval)  LUMBAR ROM:   Active  A/PROM  eval Active 10/11/22   Flexion 75% Feels pull  90% no pull  Extension 50% Increases pain  90%  Right lateral flexion To knee feels pain  To knee  Left lateral flexion 3 inches above knee feels pull 1.5 inch above knee To knee but pinches  Right rotation [facility]  Wfl but can feel a little to the right  Left rotation Feels pull on back and L groin but [facility]  WFL   (Blank rows = not tested)  LOWER EXTREMITY MMT  MMT Right eval Left eval Right  10/11/22 Left 10/11/22 Right 11/10/22 Left 11/10/22  Hip flexion _0 4+ 4+ 4+  Hip extension 3 3 4+ 4+ 4+ 4+  Hip abduction 3+ 3+ 5 5 4+ 4+  Hip adduction        Hip internal rotation        Hip external rotation        Knee flexion 4 3+ 5 4+ 5 5  Knee extension _1 Ankle dorsiflexion        Ankle plantarflexion        Ankle inversion        Ankle eversion         (Blank rows = not tested)    LUMBAR SPECIAL TESTS:  Straight leg raise test: L LE feels it in groin, Slump test: Positive, Single leg stance test: Positive, and FABER test: Positive on L; anteriorly tilted L inominate  12/7: Painful [place] R knee up, L hip off EOB  FUNCTIONAL TESTS:  SLS:  5 sec on R, 3 sec on L ([place] noted bilat) (from eval)  8 sec on R, 16 sec on L, (10/17)  5 sec on R (22 sec on second rep), 30 sec on L on 10/11/22  14 sec on R, 13 sec on L on12/7/23  5x STS 17 sec (10/17)  12 sec (11/16)    TODAY'S TREATMENT  11/10/22 THEREX Supine Hip external rotation and flexed stretch for L  anterior inominate x2 min DL bridge x10, SL bridge x10 90/90 toe tap 2x10 SIJ correction 90/90 5x5 sec  Sidelying Clamshell green TB 2x10  Standing Side stepping red TB 2x10 SIJ correction in standing for L anterior inominate rotation   11/08/22 THEREX Treadmill 1.2 mph x 4 min Supine LTR 2x30 sec R&L Supine Figure 4 stretch x30 sec Thomas stretch x30 sec on R Prone hip IR/ER x10 R&L PPT 10x 5 sec PPT + marching x10 PPT with hip abd iso against belt x10 Hip abd iso against belt + bridge x10 Self mobilization for anterior ilium in standing x30 sec Knee to chest on L x 30 sec  MANUAL THERAPY L PSIS PA mobs, R sacral PA mobs STM & TPR L glute   11/03/22 THEREX Treadmill 1.5 mph x 5 min  Sitting Seated stretch for toe extension x 30 sec ea Figure 4 stretch 2x30 sec Piriformis stretch 2x30 sec   Prone Hip ext  2x10 over 1 pillow Child's pose 2x30 sec, then one to the left x 30 sec  Sidelying Hip abd 2x10  Supine  Bridge 2x10  Standing Pallof press (one green TB) x 10 ea Marching x10 focus on core/trunk stability L lateral hip shift x10, then 1 x 30 sec hold Self traction at sink x 20 sec (felt in bil LE) Runner's stretch bil x 30 sec     PATIENT EDUCATION:  Education details: Discussed exam findings, POC, and initial HEP Person educated: Patient Education method: Explanation, Demonstration, and Handouts Education comprehension: verbalized understanding, returned demonstration, and needs further education   HOME EXERCISE PROGRAM: Access Code: [email or web address] URL: [email or web address] Date: 11/03/2022 Prepared by: Almyra Free  Exercises - Supine Butterfly Groin Stretch  - 1 x daily - 7 x weekly - 2 sets - 30 sec hold - Supine Piriformis Stretch with Foot on Ground  - 1 x daily - 7 x weekly - 2 sets - 30 sec hold - Seated Hip Flexor Stretch  - 1 x daily - 7 x weekly - 2 sets - 30 sec hold - Right Standing Lateral Shift Correction at Wall -  Repetitions  - 1 x daily - 7 x weekly - 3 sets - 10 sec hold - Standing Anti-Rotation Press with Anchored Resistance  - 1 x daily - 7 x weekly - 2 sets - 10 reps - 3 sec hold - Single Leg Bridge  - 1 x daily - 7 x weekly - 2 sets - 10 reps - Standing Glute Med Mobilization with Small Ball on Wall  - 1 x daily - 7 x weekly - 2 sets - 30 sec hold - Glute Med Wall Lean  - 1 x daily - 7 x weekly - 1 sets - 10 reps - 5 sec hold - Sidelying Hip Abduction  - 1 x daily - 7 x weekly - 2 sets - 10 reps - Child's Pose Stretch  - 1 x daily - 7 x weekly - 2 sets - 10 reps - Gastroc Stretch on Wall  - 2 x daily - 7 x weekly - 1 sets - 3 reps - 30-60 sec hold - Standing Toe Dorsiflexion Stretch  - 2 x daily - 7 x weekly - 1 sets - 3 reps - 30 sec hold  ASSESSMENT:  CLINICAL IMPRESSION: Pt states she was recommended to pursue surgery for her L3/L4 (disc is completely gone per neurosurgeon). Discussed d/c-ing PT -- pt to continue strengthening at home. Pt demos decreased strength from last MMT; however, pt currently is experiencing exacerbation of pain (Pt is experiencing radiating symptoms to front of thigh which had not been present on prior treatments). She is demonstrating improved lumbar ROM. Pt has partially met her goals. Continued to address L anterior inominate rotation. Updated pt's HEP to continue her strengthening until surgery.    OBJECTIVE IMPAIRMENTS Abnormal gait, decreased activity tolerance, decreased mobility, difficulty walking, decreased ROM, decreased strength, hypomobility, increased fascial restrictions, increased muscle spasms, impaired flexibility, improper body mechanics, postural dysfunction, and pain.   ACTIVITY LIMITATIONS lifting, bending, standing, sleeping, stairs, transfers, bed mobility, and locomotion level  PARTICIPATION LIMITATIONS: cleaning, laundry, shopping, and community activity  PERSONAL FACTORS Past/current experiences and Time since onset of  injury/illness/exacerbation are also affecting patient's functional outcome.   REHAB POTENTIAL: Good  CLINICAL DECISION MAKING: Evolving/moderate complexity  EVALUATION COMPLEXITY: Moderate   GOALS: Goals reviewed with patient? Yes  SHORT TERM GOALS: Target date: 09/19/2022  Pt will be ind with  initial HEP Baseline: Goal status: MET  2.  Pt will demo improved L pelvic mobility during spring testing to feel equal with R Baseline:  Goal status: MET  3.  Pt will be able to demo at least 5 sec on L SLS to demo improving LE stability Baseline: 16 sec on L Goal status: MET  4.  Pt will be able to tolerate performing 5x STS without UEs with equal weight shift with pain </=5/10 to demo improving L LE functional strength Baseline: 0/10; 17 sec Goal status: MET    LONG TERM GOALS TARGET DATE: 11/15/2022   Pt will be ind with maintaining community and home wellness exercise program Baseline:  Goal status: MET  2.  Pt will demo L=R lateral lumbar flexion AROM for improved trunk posture and symmetry for standing balance Baseline: AROM is equal; however, reports some increase in pain 11/10/22 Goal status: MET  3.  Pt will have increased FOTO score to 41 Baseline: 40 on 12/8 Goal status: PARTIALLY MET  4. Pt will report improved pain by >/= 50%  Goal status: NOT MET  PLAN: PT FREQUENCY: 2x/week  PT DURATION: 8 weeks  PLANNED INTERVENTIONS: Therapeutic exercises, Therapeutic activity, Neuromuscular re-education, Balance training, Gait training, Patient/Family education, Self Care, Joint mobilization, Aquatic Therapy, Dry Needling, Electrical stimulation, Spinal mobilization, Cryotherapy, Moist heat, Taping, Ionotophoresis 65m/ml Dexamethasone, Manual therapy, and Re-evaluation.  PLAN FOR NEXT SESSION: Assess response to HEP. Manual therapy as indicated. Correct sacrum/pelvis alignment. Continue balance/stability   GHuntington Va Medical CenterApril Ma L NShamokin Dam PT, DPT 11/11/2022, 12:20 PM

## 2022-11-15 ENCOUNTER — Other Ambulatory Visit: Payer: Self-pay | Admitting: Neurosurgery

## 2022-11-21 ENCOUNTER — Other Ambulatory Visit: Payer: Self-pay | Admitting: Neurosurgery

## 2022-11-23 ENCOUNTER — Other Ambulatory Visit: Payer: Self-pay | Admitting: Physician Assistant

## 2022-12-01 DIAGNOSIS — M47816 Spondylosis without myelopathy or radiculopathy, lumbar region: Secondary | ICD-10-CM | POA: Diagnosis not present

## 2022-12-01 DIAGNOSIS — M5116 Intervertebral disc disorders with radiculopathy, lumbar region: Secondary | ICD-10-CM | POA: Diagnosis not present

## 2022-12-06 ENCOUNTER — Encounter: Payer: Self-pay | Admitting: Family Medicine

## 2022-12-06 ENCOUNTER — Ambulatory Visit (INDEPENDENT_AMBULATORY_CARE_PROVIDER_SITE_OTHER): Payer: Medicare HMO | Admitting: Family Medicine

## 2022-12-06 ENCOUNTER — Ambulatory Visit (INDEPENDENT_AMBULATORY_CARE_PROVIDER_SITE_OTHER): Payer: Medicare HMO

## 2022-12-06 VITALS — BP 139/74 | HR 99 | Temp 97.8°F | Ht 65.0 in | Wt 128.2 lb

## 2022-12-06 DIAGNOSIS — J069 Acute upper respiratory infection, unspecified: Secondary | ICD-10-CM

## 2022-12-06 DIAGNOSIS — R918 Other nonspecific abnormal finding of lung field: Secondary | ICD-10-CM | POA: Diagnosis not present

## 2022-12-06 DIAGNOSIS — M47814 Spondylosis without myelopathy or radiculopathy, thoracic region: Secondary | ICD-10-CM | POA: Diagnosis not present

## 2022-12-06 DIAGNOSIS — J929 Pleural plaque without asbestos: Secondary | ICD-10-CM | POA: Diagnosis not present

## 2022-12-06 MED ORDER — AZITHROMYCIN 250 MG PO TABS: ORAL_TABLET | ORAL | 0 refills | Status: AC

## 2022-12-06 MED ORDER — BENZONATATE 100 MG PO CAPS: 100.0000 mg | ORAL_CAPSULE | Freq: Two times a day (BID) | ORAL | 0 refills | Status: DC | PRN

## 2022-12-06 NOTE — Progress Notes (Signed)
Acute Office Visit  Subjective:     Patient ID: Krista [name], female    DOB: 1951/04/24, 72 y.o.   MRN: [id number]  Chief Complaint  Patient presents with   Nasal Congestion    Patient in office c/o nasal congestion producing green mucus,cough producing hard green mucus x Friday before christmas mucinex and sudafed OTC not helped much - husband seen by his provider for same symptoms and dx URI - patient scheduled for back surgery on 12/13/22. Patient home COVID test was negative=kph    HPI Patient is in today for ACUTE VISIT. PT IS NEW TO ME.  Pt reports since the Friday before Christmas, Dec 23rd, she's felt ill. She has had cough and nasal congestion productive of greenish mucus. She has tried Mucinex and Sudafed OTC without relief of her symptoms. She has felt general fatigue since being sick and feeling bad. She has done covid test at home and was negative. She has back surgery next week and is trying to feel better before this. She also reports some SOB but no chest pains. Pt has hx of COPD, past hx of tobacco use but hasn't smoked in the last 12 years. She denies wheezing.   Patient Active Problem List   Diagnosis Date Noted   Osteoporosis 11/05/2020   DDD (degenerative disc disease), cervical 07/06/2020   Facet arthritis of lumbar region 05/06/2020   Bilateral wrist pain 05/06/2020   Inflammatory arthritis 03/13/2020   Osteoarthritis of left little finger 07/31/2018   Acute bronchitis with COPD ([place]) 05/10/2018   Impacted cerumen of left ear 03/07/2018   History of left mastoidectomy 03/07/2018   Tympanosclerosis, left ear 03/07/2018   Cardiac risk counseling 09/05/2017   Essential hypertension 09/05/2017   Mixed hyperlipidemia 09/05/2017   History of Bell's palsy 08/15/2017   History of osteoporosis 08/15/2017   History of colon polyps 08/15/2017   History of nonmelanoma skin cancer 08/15/2017   Brain aneurysm 08/15/2017   Bilateral hearing loss 08/15/2017   H/O  hysterectomy for benign disease 08/15/2017    Review of Systems  Constitutional:  Positive for malaise/fatigue. Negative for chills and diaphoresis.  HENT:  Positive for congestion. Negative for ear pain, sinus pain and sore throat.   Respiratory:  Positive for cough, sputum production and shortness of breath. Negative for hemoptysis and wheezing.   Cardiovascular:  Negative for chest pain and palpitations.  All other systems reviewed and are negative.       Objective:    BP 139/74   Pulse 99   Temp 97.8 F (36.6 C)   Ht '5\' 5"'$  (1.651 m)   Wt 128 lb 4 oz (58.2 kg)   SpO2 99%   BMI 21.34 kg/m    Physical Exam Vitals and nursing note reviewed.  Constitutional:      Appearance: Normal appearance. She is normal weight. She is ill-appearing. She is not toxic-appearing.  HENT:     Head: Normocephalic and atraumatic.     Right Ear: Tympanic membrane, ear canal and external ear normal.     Left Ear: Tympanic membrane, ear canal and external ear normal.     Nose: Nose normal.     Mouth/Throat:     Mouth: Mucous membranes are moist.     Pharynx: Oropharynx is clear.  Eyes:     Extraocular Movements: Extraocular movements intact.     Conjunctiva/sclera: Conjunctivae normal.     Pupils: Pupils are equal, round, and reactive to light.  Cardiovascular:  Rate and Rhythm: Normal rate and regular rhythm.     Pulses: Normal pulses.     Heart sounds: Normal heart sounds.  Pulmonary:     Effort: Pulmonary effort is normal.     Comments: Diminished breath sounds on Left Abdominal:     General: Abdomen is flat. Bowel sounds are normal.  Skin:    General: Skin is warm.     Capillary Refill: Capillary refill takes less than 2 seconds.  Neurological:     General: No focal deficit present.     Mental Status: She is alert and oriented to person, place, and time. Mental status is at baseline.  Psychiatric:        Mood and Affect: Mood normal.        Behavior: Behavior normal.         Thought Content: Thought content normal.        Judgment: Judgment normal.    No results found for any visits on 12/06/22.      Assessment & Plan:   Problem List Items Addressed This Visit   None Visit Diagnoses     Upper respiratory tract infection, unspecified type    -  Primary   Relevant Medications   benzonatate (TESSALON) 100 MG capsule   azithromycin (ZITHROMAX) 250 MG tablet   Other Relevant Orders   DG Chest 2 View       Meds ordered this encounter  Medications   benzonatate (TESSALON) 100 MG capsule    Sig: Take 1 capsule (100 mg total) by mouth 2 (two) times daily as needed for cough.    Dispense:  20 capsule    Refill:  0   azithromycin (ZITHROMAX) 250 MG tablet    Sig: Take 2 tablets on day 1, then 1 tablet daily on days 2 through 5    Dispense:  6 tablet    Refill:  0   Pt ill appearing with 2 weeks of URI symptoms. Add z pack along with tessalon perles to use BID prn for cough.  Due to upcoming surgery, diminished breath sounds, SOB with cough; send for xray to rule out pneumonia.   Return if symptoms worsen or fail to improve.  Leeanne Rio, MD

## 2022-12-07 NOTE — Pre-Procedure Instructions (Signed)
Surgical Instructions    Your procedure is scheduled on Tuesday 12/13/22.   Report to [facility] Main Entrance "A" at 05:30 A.M., then check in with the Admitting office.  Call this number if you have problems the morning of surgery:  [phone]   If you have any questions prior to your surgery date call [phone]: Open Monday-Friday 8am-4pm If you experience any cold or flu symptoms such as cough, fever, chills, shortness of breath, etc. between now and your scheduled surgery, please notify us at the above number     Remember:  Do not eat or drink after midnight the night before your surgery    Take these medicines the morning of surgery with A SIP OF WATER:     benzonatate (TESSALON)- If needed  cyclobenzaprine (FLEXERIL)- If needed   As of today, STOP taking any Aspirin (unless otherwise instructed by your surgeon) Aleve, Naproxen, Ibuprofen, Motrin, Advil, Goody's, BC's, all herbal medications, fish oil, Meloxicam, and all vitamins.           Do not wear jewelry or makeup. Do not wear lotions, powders, perfumes/cologne or deodorant. Do not shave 48 hours prior to surgery.  Men may shave face and neck. Do not bring valuables to the hospital. Do not wear nail polish, gel polish, artificial nails, or any other type of covering on natural nails (fingers and toes) If you have artificial nails or gel coating that need to be removed by a nail salon, please have this removed prior to surgery. Artificial nails or gel coating may interfere with anesthesia's ability to adequately monitor your vital signs.  [facility] is not responsible for any belongings or valuables.    Do NOT Smoke (Tobacco/Vaping)  24 hours prior to your procedure  If you use a CPAP at night, you may bring your mask for your overnight stay.   Contacts, glasses, hearing aids, dentures or partials may not be worn into surgery, please bring cases for these belongings   For patients admitted to the hospital,  discharge time will be determined by your treatment team.   Patients discharged the day of surgery will not be allowed to drive home, and someone needs to stay with them for 24 hours.   SURGICAL WAITING ROOM VISITATION Patients having surgery or a procedure may have no more than 2 support people in the waiting area - these visitors may rotate.   Children under the age of 77 must have an adult with them who is not the patient. If the patient needs to stay at the hospital during part of their recovery, the visitor guidelines for inpatient rooms apply. Pre-op nurse will coordinate an appropriate time for 1 support person to accompany patient in pre-op.  This support person may not rotate.   Please refer to [email or web address] for the visitor guidelines for Inpatients (after your surgery is over and you are in a regular room).    Special instructions:    Oral Hygiene is also important to reduce your risk of infection.  Remember - BRUSH YOUR TEETH THE MORNING OF SURGERY WITH YOUR REGULAR TOOTHPASTE   Krista [name]- Preparing For Surgery  Before surgery, you can play an important role. Because skin is not sterile, your skin needs to be as free of germs as possible. You can reduce the number of germs on your skin by washing with CHG (chlorahexidine gluconate) Soap before surgery.  CHG is an antiseptic cleaner which kills germs and bonds with the skin to continue killing germs  even after washing.     Please do not use if you have an allergy to CHG or antibacterial soaps. If your skin becomes reddened/irritated stop using the CHG.  Do not shave (including legs and underarms) for at least 48 hours prior to first CHG shower. It is OK to shave your face.  Please follow these instructions carefully.     Shower the NIGHT BEFORE SURGERY and the MORNING OF SURGERY with CHG Soap.   If you chose to wash your hair, wash your hair first as usual with your  normal shampoo. After you shampoo, rinse your hair and body thoroughly to remove the shampoo.  Then [employer] and genitals (private parts) with your normal soap and rinse thoroughly to remove soap.  After that Use CHG Soap as you would any other liquid soap. You can apply CHG directly to the skin and wash gently with a scrungie or a clean washcloth.   Apply the CHG Soap to your body ONLY FROM THE NECK DOWN.  Do not use on open wounds or open sores. Avoid contact with your eyes, ears, mouth and genitals (private parts). Wash Face and genitals (private parts)  with your normal soap.   Wash thoroughly, paying special attention to the area where your surgery will be performed.  Thoroughly rinse your body with warm water from the neck down.  DO NOT shower/wash with your normal soap after using and rinsing off the CHG Soap.  Pat yourself dry with a CLEAN TOWEL.  Wear CLEAN PAJAMAS to bed the night before surgery  Place CLEAN SHEETS on your bed the night before your surgery  DO NOT SLEEP WITH PETS.   Day of Surgery:  Take a shower with CHG soap. Wear Clean/Comfortable clothing the morning of surgery Do not apply any deodorants/lotions.   Remember to brush your teeth WITH YOUR REGULAR TOOTHPASTE.    If you received a COVID test during your pre-op visit, it is requested that you wear a mask when out in public, stay away from anyone that may not be feeling well, and notify your surgeon if you develop symptoms. If you have been in contact with anyone that has tested positive in the last 10 days, please notify your surgeon.    Please read over the following fact sheets that you were given.

## 2022-12-08 ENCOUNTER — Encounter (HOSPITAL_COMMUNITY)
Admission: RE | Admit: 2022-12-08 | Discharge: 2022-12-08 | Disposition: A | Discharge status: Home / Self Care | Payer: Medicare HMO | Source: Ambulatory Visit | Attending: Neurosurgery | Admitting: Neurosurgery

## 2022-12-08 ENCOUNTER — Other Ambulatory Visit: Payer: Self-pay

## 2022-12-08 ENCOUNTER — Encounter (HOSPITAL_COMMUNITY): Payer: Self-pay

## 2022-12-08 VITALS — BP 145/75 | HR 97 | Temp 97.6°F | Resp 18 | Ht 65.0 in | Wt 127.4 lb

## 2022-12-08 DIAGNOSIS — K219 Gastro-esophageal reflux disease without esophagitis: Secondary | ICD-10-CM | POA: Insufficient documentation

## 2022-12-08 DIAGNOSIS — M4726 Other spondylosis with radiculopathy, lumbar region: Secondary | ICD-10-CM | POA: Insufficient documentation

## 2022-12-08 DIAGNOSIS — J439 Emphysema, unspecified: Secondary | ICD-10-CM | POA: Diagnosis not present

## 2022-12-08 DIAGNOSIS — Z87891 Personal history of nicotine dependence: Secondary | ICD-10-CM | POA: Insufficient documentation

## 2022-12-08 DIAGNOSIS — M5116 Intervertebral disc disorders with radiculopathy, lumbar region: Secondary | ICD-10-CM | POA: Diagnosis not present

## 2022-12-08 DIAGNOSIS — R9431 Abnormal electrocardiogram [ECG] [EKG]: Secondary | ICD-10-CM | POA: Diagnosis not present

## 2022-12-08 DIAGNOSIS — J449 Chronic obstructive pulmonary disease, unspecified: Secondary | ICD-10-CM | POA: Insufficient documentation

## 2022-12-08 DIAGNOSIS — Z01818 Encounter for other preprocedural examination: Secondary | ICD-10-CM | POA: Diagnosis not present

## 2022-12-08 DIAGNOSIS — Z85828 Personal history of other malignant neoplasm of skin: Secondary | ICD-10-CM | POA: Insufficient documentation

## 2022-12-08 DIAGNOSIS — I10 Essential (primary) hypertension: Secondary | ICD-10-CM | POA: Insufficient documentation

## 2022-12-08 DIAGNOSIS — E785 Hyperlipidemia, unspecified: Secondary | ICD-10-CM | POA: Insufficient documentation

## 2022-12-08 DIAGNOSIS — I700 Atherosclerosis of aorta: Secondary | ICD-10-CM | POA: Diagnosis not present

## 2022-12-08 DIAGNOSIS — M48061 Spinal stenosis, lumbar region without neurogenic claudication: Secondary | ICD-10-CM | POA: Diagnosis not present

## 2022-12-08 DIAGNOSIS — Z8679 Personal history of other diseases of the circulatory system: Secondary | ICD-10-CM | POA: Diagnosis not present

## 2022-12-08 DIAGNOSIS — Z79899 Other long term (current) drug therapy: Secondary | ICD-10-CM | POA: Insufficient documentation

## 2022-12-08 HISTORY — DX: Pneumonia, unspecified organism: J18.9

## 2022-12-08 LAB — SURGICAL PCR SCREEN
MRSA, PCR: NEGATIVE
Staphylococcus aureus: NEGATIVE

## 2022-12-08 LAB — BASIC METABOLIC PANEL
Anion gap: 10 (ref 5–15)
BUN: 14 mg/dL (ref 8–23)
CO2: 28 mmol/L (ref 22–32)
Calcium: 9.9 mg/dL (ref 8.9–10.3)
Chloride: 99 mmol/L (ref 98–111)
Creatinine, Ser: 1.22 mg/dL — ABNORMAL HIGH (ref 0.44–1.00)
GFR, Estimated: 47 mL/min — ABNORMAL LOW (ref >60)
Glucose, Bld: 116 mg/dL — ABNORMAL HIGH (ref 70–99)
Potassium: 3.1 mmol/L — ABNORMAL LOW (ref 3.5–5.1)
Sodium: 137 mmol/L (ref 135–145)

## 2022-12-08 LAB — CBC
HCT: 33.3 % — ABNORMAL LOW (ref 36.0–46.0)
Hemoglobin: 10.8 g/dL — ABNORMAL LOW (ref 12.0–15.0)
MCH: 30.8 pg (ref 26.0–34.0)
MCHC: 32.4 g/dL (ref 30.0–36.0)
MCV: 94.9 fL (ref 80.0–100.0)
Platelets: 374 10*3/uL (ref 150–400)
RBC: 3.51 MIL/uL — ABNORMAL LOW (ref 3.87–5.11)
RDW: 12.4 % (ref 11.5–15.5)
WBC: 6.5 10*3/uL (ref 4.0–10.5)
nRBC: 0 % (ref 0.0–0.2)

## 2022-12-08 NOTE — Progress Notes (Signed)
PCP - [name] Cardiologist -  Denies  PPM/ICD -  Denies   Chest x-ray - N/A. Pt has been recently dx with upper respiratory infection. Neg Xray 12/06/2022  EKG - 12/08/2022 Stress Test - Yes. Per Pt unremarkable >5 years ago ECHO - Per pt unremarkable > 5 years ago Cardiac Cath - Denies  Sleep Study - Denies Blood Thinner Instructions: N/A.  Pt denies Aspirin Instructions:N/A.  Pt was instructed to stop Mobic.    ERAS Protcol - N/A   COVID TEST- N/A   Anesthesia review: Yes.  EKG review/hx of post op N&V  Patient denies shortness of breath, fever, cough and chest pain at PAT appointment   All instructions explained to the patient, with a verbal understanding of the material. Patient agrees to go over the instructions while at home for a better understanding. Patient also instructed to self quarantine after being tested for COVID-19. The opportunity to ask questions was provided.

## 2022-12-09 LAB — TYPE AND SCREEN
ABO/RH(D): A POS
Antibody Screen: NEGATIVE

## 2022-12-09 NOTE — Progress Notes (Addendum)
Anesthesia Chart Review:  Case: [id number] Date/Time: 12/13/22 0715   Procedure: PRONE TRANSPSOAS INTERBODY FUSION, L34 - 3C   Anesthesia type: General   Pre-op diagnosis: LUMBAR DISC DISEASE WITH RADICULOPATHY   Location: [facility] OR ROOM 46 / [place] OR   Surgeons: [name], MD       DISCUSSION: Patient is a 72 year old female scheduled for the above procedure.   History includes former smoker (quit 10/05/09), post-operative N/V, COPD, HTN, HLD, cerebral aneurysm (s/p coiling 2010), left facial asymmetry (1974, left Bell's palsy during pregnancy, had left mastoidectomy and residual left facial asymmetry; s/p left facial nerve transection, left midface tissue transfer, left superficial parotidectomy, left midface flap, right cosmetic facelift by Dr. Jacques Navy), lymphocytic colitis, GERD, C. Difficile colitis (2010), skin cancer ([place]), left upper blepharoplasty (11/12/18), s/p left small finger distal interphalangeal joint arthrodesis 09/16/21.   Noted office visit for nasal congestion and cough on 12/06/22. Notes suggest symptoms started around 11/25/22 and had been treating at home with Mucinex and Sudafed. Home COVID-19 test was negative. Due to some persistent symptoms and her upcoming surgery, she scheduled a visit and was seen by Dr. Catalina Antigua on 12/06/22. Lung sounds documented as diminished on the left. O2 sat 99%. She was diagnosed with URI and prescribed Tessalon as needed and Zithromax. CXR was negative for pneumonia.   Patient no longer at PAT when chart forwarded for review. Will plan to contact patient prior to surgery to follow-up on URI.    VS: BP (!) 145/75   Pulse 97   Temp 36.4 C (Oral)   Resp 18   Ht _0  (1.651 m)   Wt 57.8 kg   SpO2 100%   BMI 21.20 kg/m    PROVIDERS: [name], NP is PCP  Jacques Navy, MD is ENT at the [place], [place], [place] is GI provider ([facility])   LABS: Preoperative  labs noted.  (all labs ordered are listed, but only abnormal results are displayed)  Labs Reviewed  BASIC METABOLIC PANEL - Abnormal; Notable for the following components:      Result Value   Potassium 3.1 (*)    Glucose, Bld 116 (*)    Creatinine, Ser 1.22 (*)    GFR, Estimated 47 (*)    All other components within normal limits  CBC - Abnormal; Notable for the following components:   RBC 3.51 (*)    Hemoglobin 10.8 (*)    HCT 33.3 (*)    All other components within normal limits  SURGICAL PCR SCREEN  TYPE AND SCREEN    IMAGES: CXR 12/06/22: FINDINGS: Mediastinum and hilar structures normal. Heart size normal. Biapical pleural-parenchymal thickening again noted consistent with scarring. No acute infiltrate. No pleural effusion or pneumothorax. Degenerative change thoracic spine. Postsurgical changes right shoulder. IMPRESSION: Biapical pleural-parenchymal thickening again noted consistent with scarring. No acute cardiopulmonary disease.   CT Chest lung cancer screen 08/02/22: IMPRESSION: 1. Lung-RADS 2, benign appearance or behavior. Continue annual screening with low-dose chest CT without contrast in 12 months. 2.  Emphysema (ICD10-J43.9) and Aortic Atherosclerosis (ICD10-170.0)   MRI L-spine 07/16/22: MPRESSION: 1. No significant interval progression of multilevel lumbar spondylosis, greatest at the L3-4 level. 2. Moderate-severe left-sided subarticular recess stenosis and mild canal stenosis at L3-4. Severe left and moderate right foraminal stenosis at this level. 3. Moderate left and mild right subarticular recess stenosis and mild canal stenosis at L4-5.    EKG: EKG 12/09/22: Sinus  rhythm with 1st degree A-V block Nonspecific ST abnormality Abnormal ECG When compared with ECG of 16-Sep-2021 10:07, No significant change was found Confirmed by Colbert, Will [phone]) on 12/08/2022 4:59:24 PM   CV: She reported an unremarkable stress test and echo > 5 years  ago.   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    Depression    2021   Facial asymmetry, acquired 1974   left side paralysis   GERD (gastroesophageal reflux disease) 2021   H/O: Bell's palsy 1974   per pt during pregnancy , left side paralysis, told possible bell's palsy or stroke, after testing done had left mastoidectomy;  residual left side facialy asymmetry   History of basal cell carcinoma (BCC) excision    History of cerebral aneurysm 2010   s/p coiling  ;  per pt was an incidental finding on imaging   History of Clostridioides difficile infection 2010   treated   History of COVID-19 05/08/2021   positive result in care everywhere; per pt mild to moderate symptoms that resolved   History of Helicobacter pylori infection 2010   treated   Hyperlipidemia    Hypertension    followed by pcp   Lymphocytic colitis    followed by dr c. Shary Key (GI);  dx by biopsy 09-13-2017   Macular degeneration of both eyes    Non-intractable vomiting with nausea    followed by dr Shary Key   OA (osteoarthritis)    Osteoporosis    Pneumonia    PONV (postoperative nausea and vomiting)    Spondylosis, unspecified    cervical and lumbar   Wears hearing aid in both ears     Past Surgical History:  Procedure Laterality Date   CARPAL TUNNEL RELEASE Bilateral    early 2000s   CATARACT EXTRACTION W/ INTRAOCULAR LENS IMPLANT Bilateral 2014   CEREBRAL ANEURYSM REPAIR  2010   endocerebral coiling   COLONOSCOPY  09/13/2017   COSMETIC SURGERY     [facility]rs Surgery on nose   DISTAL INTERPHALANGEAL JOINT FUSION Left 09/16/2021   Procedure: left small finger  distal interphalangeal joint fusion;  Surgeon: [name], MD;  Location: [facility];  Service: Orthopedics;  Laterality: Left;  with MAC anesthesia   ESOPHAGOGASTRODUODENOSCOPY  09/22/2020   EYE SURGERY  2020   Blepharoplasty   FACIAL RECONSTRUCTION SURGERY  03/28/2022   [facility]   FOOT SURGERY Bilateral    eary 2000s;  bilateral bunionectomy  and removal bone right 5th toe   FRACTURE SURGERY  1968   Shattered right ankle   MASTOIDECTOMY Left 1974   RADIOFREQUENCY ABLATION  09/01/2021   lumbar , left side   ROTATOR CUFF REPAIR Right 2014   SALPINGOOPHORECTOMY Bilateral 1995   w/ excision endometriosis via laparotomy   SPINE SURGERY  2021/2022   DDD, osteoarthritis   TONSILLECTOMY AND ADENOIDECTOMY  1970   TOTAL ABDOMINAL HYSTERECTOMY  1987   TRIGGER FINGER RELEASE Bilateral    early 2000s; one finger each hand   TUBAL LIGATION  1982   WRIST GANGLION EXCISION Left    2000s    MEDICATIONS:  azithromycin (  ZITHROMAX) 250 MG tablet   benzonatate (TESSALON) 100 MG capsule   Calcium-Magnesium-Vitamin D (CITRACAL CALCIUM+D) 600-40-500 MG-MG-UNIT TB24   cholecalciferol (VITAMIN D3) 25 MCG (1000 UNIT) tablet   cyclobenzaprine (FLEXERIL) 10 MG tablet   diphenhydramine-acetaminophen (TYLENOL PM) 25-500 MG TABS tablet   famotidine (PEPCID) 40 MG tablet   gabapentin (NEURONTIN) 100 MG capsule   LUTEIN ESTERS PO   magnesium oxide (MAG-OX) 400 (240 Mg) MG tablet   meloxicam (MOBIC) 15 MG tablet   valsartan-hydrochlorothiazide (DIOVAN-HCT) 160-12.5 MG tablet   No current facility-administered medications for this encounter.    Myra Gianotti, PA-C Surgical Short Stay/Anesthesiology [facility] Phone [phone] [facility] Phone [phone] 12/09/2022 7:05 PM

## 2022-12-12 NOTE — Anesthesia Preprocedure Evaluation (Addendum)
Anesthesia Evaluation  Patient identified by MRN, date of birth, ID band Patient awake    Reviewed: Allergy & Precautions, NPO status , Patient's Chart, lab work & pertinent test results  History of Anesthesia Complications (+) PONV and history of anesthetic complications  Airway Mallampati: II  TM Distance: >3 FB Neck ROM: Full    Dental  (+) Caps, Dental Advisory Given   Pulmonary COPD, former smoker Covid 05/2021 mild to moderate Sx, resolved   Pulmonary exam normal        Cardiovascular hypertension, Pt. on medications Normal cardiovascular exam     Neuro/Psych  PSYCHIATRIC DISORDERS Anxiety Depression    Hx/o cerebral aneurysm 2010 S/P coiling in IR Hx/o Bell's palsy S/P mastoidectomy with facial paralysis post op    GI/Hepatic Neg liver ROS,GERD  ,,Hx/o lymphocytic colitis   Endo/Other  Hyperlipidemia  Renal/GU negative Renal ROS  negative genitourinary   Musculoskeletal  (+) Arthritis , Osteoarthritis,  Osteoporosis DDD lumbar spine OA left small finger DIP joint   Abdominal   Peds  Hematology  (+) Blood dyscrasia, anemia   Anesthesia Other Findings   Reproductive/Obstetrics                             Anesthesia Physical Anesthesia Plan  ASA: 2  Anesthesia Plan: General   Post-op Pain Management: Tylenol PO (pre-op)*   Induction: Intravenous  PONV Risk Score and Plan: 4 or greater and Treatment may vary due to age or medical condition, Ondansetron, Dexamethasone and Diphenhydramine  Airway Management Planned: Oral ETT  Additional Equipment:   Intra-op Plan:   Post-operative Plan: Extubation in OR  Informed Consent: I have reviewed the patients History and Physical, chart, labs and discussed the procedure including the risks, benefits and alternatives for the proposed anesthesia with the patient or authorized representative who has indicated his/her understanding  and acceptance.     Dental advisory given  Plan Discussed with: Anesthesiologist and CRNA  Anesthesia Plan Comments: (PAT note written by Myra Gianotti, PA-C.  )        Anesthesia Quick Evaluation

## 2022-12-13 ENCOUNTER — Encounter (HOSPITAL_COMMUNITY): Payer: Self-pay | Admitting: Neurosurgery

## 2022-12-13 ENCOUNTER — Inpatient Hospital Stay (HOSPITAL_COMMUNITY): Payer: Medicare HMO

## 2022-12-13 ENCOUNTER — Other Ambulatory Visit: Payer: Self-pay

## 2022-12-13 ENCOUNTER — Inpatient Hospital Stay (HOSPITAL_COMMUNITY)
Admission: RE | Admit: 2022-12-13 | Discharge: 2022-12-14 | DRG: 460 | Disposition: A | Discharge status: Home / Self Care | Payer: Medicare HMO | Attending: Neurosurgery | Admitting: Neurosurgery

## 2022-12-13 ENCOUNTER — Inpatient Hospital Stay (HOSPITAL_COMMUNITY): Payer: Medicare HMO | Admitting: Vascular Surgery

## 2022-12-13 ENCOUNTER — Encounter (HOSPITAL_COMMUNITY)
Admission: RE | Disposition: A | Discharge status: Home / Self Care | Payer: Self-pay | Source: Home / Self Care | Attending: Neurosurgery

## 2022-12-13 ENCOUNTER — Inpatient Hospital Stay (HOSPITAL_COMMUNITY): Payer: Medicare HMO | Admitting: Anesthesiology

## 2022-12-13 DIAGNOSIS — Z888 Allergy status to other drugs, medicaments and biological substances status: Secondary | ICD-10-CM

## 2022-12-13 DIAGNOSIS — M4726 Other spondylosis with radiculopathy, lumbar region: Secondary | ICD-10-CM | POA: Diagnosis not present

## 2022-12-13 DIAGNOSIS — J449 Chronic obstructive pulmonary disease, unspecified: Secondary | ICD-10-CM | POA: Diagnosis present

## 2022-12-13 DIAGNOSIS — I10 Essential (primary) hypertension: Secondary | ICD-10-CM | POA: Diagnosis not present

## 2022-12-13 DIAGNOSIS — M810 Age-related osteoporosis without current pathological fracture: Secondary | ICD-10-CM | POA: Diagnosis present

## 2022-12-13 DIAGNOSIS — Z885 Allergy status to narcotic agent status: Secondary | ICD-10-CM

## 2022-12-13 DIAGNOSIS — Z85828 Personal history of other malignant neoplasm of skin: Secondary | ICD-10-CM | POA: Diagnosis not present

## 2022-12-13 DIAGNOSIS — E785 Hyperlipidemia, unspecified: Secondary | ICD-10-CM | POA: Diagnosis not present

## 2022-12-13 DIAGNOSIS — Z87891 Personal history of nicotine dependence: Secondary | ICD-10-CM

## 2022-12-13 DIAGNOSIS — Z79899 Other long term (current) drug therapy: Secondary | ICD-10-CM

## 2022-12-13 DIAGNOSIS — Z9109 Other allergy status, other than to drugs and biological substances: Secondary | ICD-10-CM | POA: Diagnosis not present

## 2022-12-13 DIAGNOSIS — M4316 Spondylolisthesis, lumbar region: Secondary | ICD-10-CM | POA: Diagnosis not present

## 2022-12-13 DIAGNOSIS — F419 Anxiety disorder, unspecified: Secondary | ICD-10-CM | POA: Diagnosis present

## 2022-12-13 DIAGNOSIS — M5116 Intervertebral disc disorders with radiculopathy, lumbar region: Secondary | ICD-10-CM | POA: Diagnosis present

## 2022-12-13 DIAGNOSIS — M4326 Fusion of spine, lumbar region: Secondary | ICD-10-CM | POA: Diagnosis not present

## 2022-12-13 DIAGNOSIS — Z974 Presence of external hearing-aid: Secondary | ICD-10-CM

## 2022-12-13 DIAGNOSIS — Z8616 Personal history of COVID-19: Secondary | ICD-10-CM

## 2022-12-13 DIAGNOSIS — M4800 Spinal stenosis, site unspecified: Secondary | ICD-10-CM | POA: Diagnosis not present

## 2022-12-13 DIAGNOSIS — Z01818 Encounter for other preprocedural examination: Secondary | ICD-10-CM

## 2022-12-13 DIAGNOSIS — F32A Depression, unspecified: Secondary | ICD-10-CM | POA: Diagnosis not present

## 2022-12-13 DIAGNOSIS — Z91048 Other nonmedicinal substance allergy status: Secondary | ICD-10-CM

## 2022-12-13 HISTORY — PX: ANTERIOR LAT LUMBAR FUSION: SHX1168

## 2022-12-13 HISTORY — DX: Other spondylosis with radiculopathy, lumbar region: M47.26

## 2022-12-13 LAB — ABO/RH: ABO/RH(D): A POS

## 2022-12-13 SURGERY — ANTERIOR LATERAL LUMBAR FUSION 1 LEVEL
Anesthesia: General

## 2022-12-13 MED ORDER — LACTATED RINGERS IV SOLN: INTRAVENOUS | Status: DC

## 2022-12-13 MED ORDER — DOCUSATE SODIUM 100 MG PO CAPS
100.0000 mg | ORAL_CAPSULE | Freq: Two times a day (BID) | ORAL | Status: DC
  Administered 2022-12-13: 100 mg via ORAL
  Filled 2022-12-13: qty 1

## 2022-12-13 MED ORDER — VALSARTAN-HYDROCHLOROTHIAZIDE 160-12.5 MG PO TABS: 1.0000 | ORAL_TABLET | Freq: Every evening | ORAL | Status: DC

## 2022-12-13 MED ORDER — ACETAMINOPHEN 325 MG PO TABS
650.0000 mg | ORAL_TABLET | ORAL | Status: DC | PRN
  Administered 2022-12-13 – 2022-12-14 (×3): 650 mg via ORAL
  Filled 2022-12-13 (×3): qty 2

## 2022-12-13 MED ORDER — FENTANYL CITRATE (PF) 250 MCG/5ML IJ SOLN
INTRAMUSCULAR | Status: AC
  Filled 2022-12-13: qty 5

## 2022-12-13 MED ORDER — METHOCARBAMOL 1000 MG/10ML IJ SOLN: 500.0000 mg | Freq: Four times a day (QID) | INTRAVENOUS | Status: DC | PRN

## 2022-12-13 MED ORDER — ONDANSETRON HCL 4 MG PO TABS: 4.0000 mg | ORAL_TABLET | Freq: Four times a day (QID) | ORAL | Status: DC | PRN

## 2022-12-13 MED ORDER — SODIUM CHLORIDE 0.9 % IV SOLN: 250.0000 mL | INTRAVENOUS | Status: DC

## 2022-12-13 MED ORDER — ORAL CARE MOUTH RINSE: 15.0000 mL | Freq: Once | OROMUCOSAL | Status: AC

## 2022-12-13 MED ORDER — PANTOPRAZOLE SODIUM 40 MG IV SOLR: 40.0000 mg | Freq: Every day | INTRAVENOUS | Status: DC

## 2022-12-13 MED ORDER — PHENYLEPHRINE HCL-NACL 20-0.9 MG/250ML-% IV SOLN
INTRAVENOUS | Status: DC | PRN
  Administered 2022-12-13: 25 ug/min via INTRAVENOUS

## 2022-12-13 MED ORDER — HYDROMORPHONE HCL 1 MG/ML IJ SOLN
0.2500 mg | INTRAMUSCULAR | Status: DC | PRN
  Administered 2022-12-13 (×2): 0.5 mg via INTRAVENOUS

## 2022-12-13 MED ORDER — BUPIVACAINE HCL (PF) 0.5 % IJ SOLN
INTRAMUSCULAR | Status: AC
  Filled 2022-12-13: qty 30

## 2022-12-13 MED ORDER — OXYCODONE HCL 5 MG PO TABS
ORAL_TABLET | ORAL | Status: AC
  Filled 2022-12-13: qty 1

## 2022-12-13 MED ORDER — HYDROCHLOROTHIAZIDE 12.5 MG PO TABS
12.5000 mg | ORAL_TABLET | Freq: Every evening | ORAL | Status: DC
  Administered 2022-12-13: 12.5 mg via ORAL
  Filled 2022-12-13: qty 1

## 2022-12-13 MED ORDER — DIPHENHYDRAMINE HCL 50 MG/ML IJ SOLN
INTRAMUSCULAR | Status: DC | PRN
  Administered 2022-12-13: 12.5 mg via INTRAVENOUS

## 2022-12-13 MED ORDER — ACETAMINOPHEN 500 MG PO TABS
1000.0000 mg | ORAL_TABLET | Freq: Once | ORAL | Status: AC
  Administered 2022-12-13: 1000 mg via ORAL
  Filled 2022-12-13: qty 2

## 2022-12-13 MED ORDER — OXYCODONE HCL 5 MG PO TABS
5.0000 mg | ORAL_TABLET | ORAL | Status: DC | PRN
  Administered 2022-12-13 (×2): 5 mg via ORAL
  Filled 2022-12-13 (×2): qty 1

## 2022-12-13 MED ORDER — HYDROXYZINE HCL 50 MG/ML IM SOLN
50.0000 mg | Freq: Four times a day (QID) | INTRAMUSCULAR | Status: DC | PRN
  Administered 2022-12-13: 50 mg via INTRAMUSCULAR
  Filled 2022-12-13: qty 1

## 2022-12-13 MED ORDER — AMISULPRIDE (ANTIEMETIC) 5 MG/2ML IV SOLN: 10.0000 mg | Freq: Once | INTRAVENOUS | Status: DC | PRN

## 2022-12-13 MED ORDER — MORPHINE SULFATE (PF) 2 MG/ML IV SOLN
2.0000 mg | INTRAVENOUS | Status: DC | PRN
  Filled 2022-12-13: qty 1

## 2022-12-13 MED ORDER — PANTOPRAZOLE SODIUM 40 MG PO TBEC
40.0000 mg | DELAYED_RELEASE_TABLET | Freq: Every day | ORAL | Status: DC
  Filled 2022-12-13: qty 1

## 2022-12-13 MED ORDER — LIDOCAINE 2% (20 MG/ML) 5 ML SYRINGE
INTRAMUSCULAR | Status: AC
  Filled 2022-12-13: qty 5

## 2022-12-13 MED ORDER — PROMETHAZINE HCL 25 MG/ML IJ SOLN: 6.2500 mg | INTRAMUSCULAR | Status: DC | PRN

## 2022-12-13 MED ORDER — SODIUM CHLORIDE 0.9 % IV SOLN: INTRAVENOUS | Status: DC

## 2022-12-13 MED ORDER — THROMBIN 5000 UNITS EX SOLR
OROMUCOSAL | Status: DC | PRN
  Administered 2022-12-13: 5 mL via TOPICAL

## 2022-12-13 MED ORDER — FENTANYL CITRATE (PF) 100 MCG/2ML IJ SOLN
25.0000 ug | INTRAMUSCULAR | Status: DC | PRN
  Administered 2022-12-13 (×2): 50 ug via INTRAVENOUS
  Administered 2022-12-13 (×2): 25 ug via INTRAVENOUS

## 2022-12-13 MED ORDER — FENTANYL CITRATE (PF) 100 MCG/2ML IJ SOLN
INTRAMUSCULAR | Status: AC
  Filled 2022-12-13: qty 2

## 2022-12-13 MED ORDER — IRBESARTAN 150 MG PO TABS
150.0000 mg | ORAL_TABLET | Freq: Every evening | ORAL | Status: DC
  Administered 2022-12-13: 150 mg via ORAL
  Filled 2022-12-13: qty 1

## 2022-12-13 MED ORDER — SODIUM CHLORIDE 0.9% FLUSH: 3.0000 mL | INTRAVENOUS | Status: DC | PRN

## 2022-12-13 MED ORDER — DEXAMETHASONE SODIUM PHOSPHATE 10 MG/ML IJ SOLN
INTRAMUSCULAR | Status: AC
  Filled 2022-12-13: qty 1

## 2022-12-13 MED ORDER — PHENOL 1.4 % MT LIQD: 1.0000 | OROMUCOSAL | Status: DC | PRN

## 2022-12-13 MED ORDER — ACETAMINOPHEN 10 MG/ML IV SOLN
1000.0000 mg | Freq: Once | INTRAVENOUS | Status: AC
  Administered 2022-12-13: 1000 mg via INTRAVENOUS

## 2022-12-13 MED ORDER — ACETAMINOPHEN 650 MG RE SUPP: 650.0000 mg | RECTAL | Status: DC | PRN

## 2022-12-13 MED ORDER — 0.9 % SODIUM CHLORIDE (POUR BTL) OPTIME
TOPICAL | Status: DC | PRN
  Administered 2022-12-13: 1000 mL

## 2022-12-13 MED ORDER — LIDOCAINE-EPINEPHRINE 1 %-1:100000 IJ SOLN
INTRAMUSCULAR | Status: AC
  Filled 2022-12-13: qty 1

## 2022-12-13 MED ORDER — METHOCARBAMOL 500 MG PO TABS
500.0000 mg | ORAL_TABLET | Freq: Four times a day (QID) | ORAL | Status: DC | PRN
  Administered 2022-12-13 – 2022-12-14 (×3): 500 mg via ORAL
  Filled 2022-12-13 (×3): qty 1

## 2022-12-13 MED ORDER — BACITRACIN ZINC 500 UNIT/GM EX OINT
TOPICAL_OINTMENT | CUTANEOUS | Status: DC | PRN
  Administered 2022-12-13: 1 via TOPICAL

## 2022-12-13 MED ORDER — PROPOFOL 10 MG/ML IV BOLUS
INTRAVENOUS | Status: DC | PRN
  Administered 2022-12-13: 100 mg via INTRAVENOUS
  Administered 2022-12-13 (×2): 50 mg via INTRAVENOUS

## 2022-12-13 MED ORDER — CHLORHEXIDINE GLUCONATE CLOTH 2 % EX PADS: 6.0000 | MEDICATED_PAD | Freq: Once | CUTANEOUS | Status: DC

## 2022-12-13 MED ORDER — MENTHOL 3 MG MT LOZG: 1.0000 | LOZENGE | OROMUCOSAL | Status: DC | PRN

## 2022-12-13 MED ORDER — BUPIVACAINE HCL (PF) 0.5 % IJ SOLN
INTRAMUSCULAR | Status: DC | PRN
  Administered 2022-12-13: 9 mL

## 2022-12-13 MED ORDER — BACITRACIN ZINC 500 UNIT/GM EX OINT
TOPICAL_OINTMENT | CUTANEOUS | Status: AC
  Filled 2022-12-13: qty 28.35

## 2022-12-13 MED ORDER — BENZONATATE 100 MG PO CAPS: 100.0000 mg | ORAL_CAPSULE | Freq: Two times a day (BID) | ORAL | Status: DC | PRN

## 2022-12-13 MED ORDER — CEFAZOLIN SODIUM-DEXTROSE 2-4 GM/100ML-% IV SOLN
2.0000 g | Freq: Three times a day (TID) | INTRAVENOUS | Status: AC
  Administered 2022-12-13 (×2): 2 g via INTRAVENOUS
  Filled 2022-12-13 (×2): qty 100

## 2022-12-13 MED ORDER — BISACODYL 10 MG RE SUPP: 10.0000 mg | Freq: Every day | RECTAL | Status: DC | PRN

## 2022-12-13 MED ORDER — CHLORHEXIDINE GLUCONATE 0.12 % MT SOLN
15.0000 mL | Freq: Once | OROMUCOSAL | Status: AC
  Administered 2022-12-13: 15 mL via OROMUCOSAL
  Filled 2022-12-13: qty 15

## 2022-12-13 MED ORDER — GABAPENTIN 100 MG PO CAPS
100.0000 mg | ORAL_CAPSULE | Freq: Every day | ORAL | Status: DC
  Administered 2022-12-13: 100 mg via ORAL
  Filled 2022-12-13: qty 1

## 2022-12-13 MED ORDER — ONDANSETRON HCL 4 MG/2ML IJ SOLN
INTRAMUSCULAR | Status: AC
  Filled 2022-12-13: qty 2

## 2022-12-13 MED ORDER — LIDOCAINE 2% (20 MG/ML) 5 ML SYRINGE
INTRAMUSCULAR | Status: DC | PRN
  Administered 2022-12-13: 40 mg via INTRAVENOUS

## 2022-12-13 MED ORDER — FAMOTIDINE 20 MG PO TABS
40.0000 mg | ORAL_TABLET | Freq: Every day | ORAL | Status: DC
  Administered 2022-12-13: 40 mg via ORAL
  Filled 2022-12-13: qty 2

## 2022-12-13 MED ORDER — ONDANSETRON HCL 4 MG/2ML IJ SOLN
4.0000 mg | Freq: Four times a day (QID) | INTRAMUSCULAR | Status: DC | PRN
  Administered 2022-12-13: 4 mg via INTRAVENOUS
  Filled 2022-12-13: qty 2

## 2022-12-13 MED ORDER — ACETAMINOPHEN 10 MG/ML IV SOLN
INTRAVENOUS | Status: AC
  Filled 2022-12-13: qty 100

## 2022-12-13 MED ORDER — SENNA 8.6 MG PO TABS
1.0000 | ORAL_TABLET | Freq: Two times a day (BID) | ORAL | Status: DC
  Administered 2022-12-13: 8.6 mg via ORAL
  Filled 2022-12-13: qty 1

## 2022-12-13 MED ORDER — FENTANYL CITRATE (PF) 250 MCG/5ML IJ SOLN
INTRAMUSCULAR | Status: DC | PRN
  Administered 2022-12-13: 50 ug via INTRAVENOUS
  Administered 2022-12-13 (×2): 100 ug via INTRAVENOUS

## 2022-12-13 MED ORDER — THROMBIN 5000 UNITS EX SOLR
CUTANEOUS | Status: AC
  Filled 2022-12-13: qty 5000

## 2022-12-13 MED ORDER — KETOROLAC TROMETHAMINE 15 MG/ML IJ SOLN
15.0000 mg | Freq: Four times a day (QID) | INTRAMUSCULAR | Status: DC
  Administered 2022-12-13 – 2022-12-14 (×3): 15 mg via INTRAVENOUS
  Filled 2022-12-13 (×5): qty 1

## 2022-12-13 MED ORDER — LIDOCAINE-EPINEPHRINE 1 %-1:100000 IJ SOLN
INTRAMUSCULAR | Status: DC | PRN
  Administered 2022-12-13: 9 mL

## 2022-12-13 MED ORDER — PROPOFOL 500 MG/50ML IV EMUL
INTRAVENOUS | Status: DC | PRN
  Administered 2022-12-13: 100 ug/kg/min via INTRAVENOUS

## 2022-12-13 MED ORDER — DEXAMETHASONE SODIUM PHOSPHATE 10 MG/ML IJ SOLN
INTRAMUSCULAR | Status: DC | PRN
  Administered 2022-12-13: 10 mg via INTRAVENOUS

## 2022-12-13 MED ORDER — SODIUM CHLORIDE 0.9% FLUSH
3.0000 mL | Freq: Two times a day (BID) | INTRAVENOUS | Status: DC
  Administered 2022-12-13: 3 mL via INTRAVENOUS

## 2022-12-13 MED ORDER — HYDROMORPHONE HCL 1 MG/ML IJ SOLN
INTRAMUSCULAR | Status: AC
  Filled 2022-12-13: qty 1

## 2022-12-13 MED ORDER — ONDANSETRON HCL 4 MG/2ML IJ SOLN
INTRAMUSCULAR | Status: DC | PRN
  Administered 2022-12-13: 4 mg via INTRAVENOUS

## 2022-12-13 MED ORDER — OXYCODONE HCL 5 MG PO TABS
10.0000 mg | ORAL_TABLET | ORAL | Status: DC | PRN
  Administered 2022-12-13 – 2022-12-14 (×4): 10 mg via ORAL
  Filled 2022-12-13 (×4): qty 2

## 2022-12-13 MED ORDER — CEFAZOLIN SODIUM-DEXTROSE 2-4 GM/100ML-% IV SOLN
2.0000 g | INTRAVENOUS | Status: AC
  Administered 2022-12-13: 2 g via INTRAVENOUS
  Filled 2022-12-13: qty 100

## 2022-12-13 MED ORDER — SUCCINYLCHOLINE CHLORIDE 200 MG/10ML IV SOSY
PREFILLED_SYRINGE | INTRAVENOUS | Status: DC | PRN
  Administered 2022-12-13: 100 mg via INTRAVENOUS

## 2022-12-13 SURGICAL SUPPLY — 66 items
BAG COUNTER SPONGE SURGICOUNT (BAG) ×1 IMPLANT
BLADE CLIPPER SURG (BLADE) IMPLANT
CAP PUSHER PTP (ORTHOPEDIC DISPOSABLE SUPPLIES) IMPLANT
CATH ROBINSON RED A/P 12FR (CATHETERS) IMPLANT
CLIP SPRING STIM LLIF SAFEOP (CLIP) IMPLANT
DERMABOND ADVANCED .7 DNX12 (GAUZE/BANDAGES/DRESSINGS) ×2 IMPLANT
DILATOR INSULATED LLIF 8-13-18 (NEUROSURGERY SUPPLIES) IMPLANT
DISSECTOR BLUNT TIP ENDO 5MM (MISCELLANEOUS) ×1 IMPLANT
DRAPE C-ARM 42X72 X-RAY (DRAPES) ×1 IMPLANT
DRAPE C-ARMOR (DRAPES) ×1 IMPLANT
DRAPE LAPAROTOMY 100X72X124 (DRAPES) ×1 IMPLANT
DRSG OPSITE POSTOP 4X6 (GAUZE/BANDAGES/DRESSINGS) IMPLANT
DURAPREP 26ML APPLICATOR (WOUND CARE) ×1 IMPLANT
ELECT KIT SAFEOP SSEP/SURF (KITS) ×1
ELECT REM PT RETURN 9FT ADLT (ELECTROSURGICAL) ×1
ELECTRODE KT SAFEOP SSEP/SURF (KITS) IMPLANT
ELECTRODE REM PT RTRN 9FT ADLT (ELECTROSURGICAL) ×1 IMPLANT
GAUZE 4X4 16PLY ~~LOC~~+RFID DBL (SPONGE) IMPLANT
GLOVE BIO SURGEON STRL SZ7 (GLOVE) IMPLANT
GLOVE BIOGEL PI IND STRL 7.0 (GLOVE) IMPLANT
GLOVE BIOGEL PI IND STRL 7.5 (GLOVE) ×1 IMPLANT
GLOVE ECLIPSE 7.0 STRL STRAW (GLOVE) ×1 IMPLANT
GLOVE EXAM NITRILE XL STR (GLOVE) IMPLANT
GOWN STRL REUS W/ TWL LRG LVL3 (GOWN DISPOSABLE) IMPLANT
GOWN STRL REUS W/ TWL XL LVL3 (GOWN DISPOSABLE) ×2 IMPLANT
GOWN STRL REUS W/TWL 2XL LVL3 (GOWN DISPOSABLE) IMPLANT
GOWN STRL REUS W/TWL LRG LVL3 (GOWN DISPOSABLE)
GOWN STRL REUS W/TWL XL LVL3 (GOWN DISPOSABLE) ×2
GUIDEWIRE LLIF TT 310 (WIRE) IMPLANT
GUIDEWIRE LLIF TT 320 (WIRE) IMPLANT
GUIDEWIRE TROC TIP NIT 18 (WIRE) IMPLANT
KIT BASIN OR (CUSTOM PROCEDURE TRAY) ×1 IMPLANT
KIT INFUSE XX SMALL 0.7CC (Orthopedic Implant) IMPLANT
KIT TURNOVER KIT B (KITS) ×1 IMPLANT
KNIFE ANNULOTOMY (BLADE) IMPLANT
LIF ILLUMINATION SYSTEM STERIL (SYSTAGENIX WOUND MANAGEMENT) ×1
MODULE POSITIONING LIF 2.0 (INSTRUMENTS) IMPLANT
NDL TARGETING DD BP 11X15 (NEEDLE) IMPLANT
NEEDLE HYPO 22GX1.5 SAFETY (NEEDLE) ×1 IMPLANT
NEEDLE TARGETING DD BP 11X15 (NEEDLE) ×4 IMPLANT
NS IRRIG 1000ML POUR BTL (IV SOLUTION) ×1 IMPLANT
PACK LAMINECTOMY NEURO (CUSTOM PROCEDURE TRAY) ×1 IMPLANT
PROBE BALL TIP LLIF SAFEOP (NEUROSURGERY SUPPLIES) IMPLANT
PUTTY DBM 5CC (Putty) IMPLANT
ROD LORD MIS TI 5.5X50 (Rod) IMPLANT
ROD TI MIS LORDOTIC V12 5.5X45 (Rod) IMPLANT
SCREW CANN MIS PA 6.5X45 (Screw) IMPLANT
SCREW CANN PA INVICTUS 5.5X45 (Screw) IMPLANT
SET SCREW (Screw) ×4 IMPLANT
SET SCREW SPNE (Screw) IMPLANT
SHIM INTRADISCAL PTP NARROW (ORTHOPEDIC DISPOSABLE SUPPLIES) IMPLANT
SPACER LTX 6X18X50 15D (Screw) IMPLANT
SPIKE FLUID TRANSFER (MISCELLANEOUS) ×1 IMPLANT
SPONGE SURGIFOAM ABS GEL SZ50 (HEMOSTASIS) IMPLANT
SPONGE T-LAP 4X18 ~~LOC~~+RFID (SPONGE) IMPLANT
SPONGE TONSIL TAPE 1 RFD (DISPOSABLE) IMPLANT
STAPLER VISISTAT 35W (STAPLE) ×1 IMPLANT
SUT VIC AB 0 CT1 18XCR BRD8 (SUTURE) ×1 IMPLANT
SUT VIC AB 0 CT1 8-18 (SUTURE) ×2
SUT VIC AB 2-0 CT1 18 (SUTURE) ×1 IMPLANT
SUT VIC AB 3-0 SH 8-18 (SUTURE) ×1 IMPLANT
SYSTEM ILLUMINATION LIF STERIL (SYSTAGENIX WOUND MANAGEMENT) IMPLANT
TOWEL GREEN STERILE (TOWEL DISPOSABLE) ×1 IMPLANT
TOWEL GREEN STERILE FF (TOWEL DISPOSABLE) ×1 IMPLANT
TRAY FOLEY MTR SLVR 16FR STAT (SET/KITS/TRAYS/PACK) ×1 IMPLANT
WATER STERILE IRR 1000ML POUR (IV SOLUTION) ×1 IMPLANT

## 2022-12-13 NOTE — Op Note (Signed)
NEUROSURGERY OPERATIVE NOTE   PREOP DIAGNOSIS:  Lumbar spondylosis with radiculopathy, L3-4 Spondylolisthesis, L3-4   POSTOP DIAGNOSIS: Same  PROCEDURE: Prone trans-psoas approach for anterior interbody arthrodesis - L3-4 Placement of intervertebral expandable cage - Alphatec expandable cage Use of morcellized bone allograft, BMP Posterior non-segmental instrumentation L3-4 - Alphatec pedicle screws Use of intraoperative electrophysiologic monitoring  SURGEON: Dr. [name], MD  ASSISTANT: Dr. Charlie Pitter, MD  ANESTHESIA: General Endotracheal  EBL: Minimal  SPECIMENS: None  DRAINS: None  COMPLICATIONS: None immediate  CONDITION: Hemodynamically stable to PACU  HISTORY: Krista [name] is a 72 y.o. female initially seen in the outpatient neurosurgery clinic complaining of progressively worsening back and leg pain.  Her imaging did reveal significant disc degenerative disease with loss of height and foraminal stenosis worse at L3-4.  She attempted multiple different conservative treatments including physical therapy, anti-inflammatory medications, and a series of injections none of which provided lasting improvement.  She therefore elected proceed with surgical decompression and fusion.  The risks, benefits, and alternatives to surgery were all reviewed in detail with the patient and her family.  After all questions were answered informed consent was obtained and witnessed.  PROCEDURE IN DETAIL: The patient was brought to the operating room. After induction of general anesthesia, the patient was positioned on the operative table in the prone position. All pressure points were meticulously padded.  Preoperative fluoroscopy was then used to mark out the projection of the L3-4 interspace along the right flank.  Curvilinear skin incision was then marked out and prepped and the skin of the low back and right flank were then draped in the usual sterile fashion.  After timeout  was conducted, the right-sided curvilinear flank incision was infiltrated with local anesthetic with epinephrine.  Incision was then made sharply and carried down through subcutaneous tissue.  The oblique musculature was identified and dissected until the transversalis fascia was identified and opened.  The retroperitoneal space was then dissected bluntly and the transverse process of L3 and L4 were palpable.  The psoas muscle was identified.  Utilizing AP and lateral fluoroscopy, a K wire was introduced into the posterior aspect of the L3-4 disc space.  Utilizing intraoperative electrophysiologic monitoring, the dilators were sequentially placed over the L3-4 disc space, with the minimal stimulation threshold greater than 13 mA posteriorly.  120 mm retractor was then placed and secured to the bed.  AP and lateral fluoroscopic images were used to confirm good location and trajectory of the retractor.  The psoas muscle was then identified under direct vision and stimulated again with minimal stimulation threshold greater than 13 mA.  Shunt was placed posteriorly and the retractor blades were opened anteriorly.  At this point the disc space was incised and using a combination of curettes and rongeur's, discectomy was completed.  Box cutters were used.  Trials were also placed in order to open up the disc space.  Lateral fluoroscopy did confirm that good expansion of the base was achieved.  The endplates were then prepared with curettes.  The cage was then packed with BMP and tapped into place under both AP and lateral fluoroscopy.  The cage was then expanded again with lateral fluoroscopic guidance.  Good endplate apposition was achieved.  The cage was then backfilled with morselized bone allograft.  Final AP and lateral fluoroscopic images were taken after removal of the retractor under direct vision without any active bleeding identified.  The transversalis fascia and the oblique musculature was then closed with  interrupted 0 Vicryl stitches.  Multiple deep subcutaneous stitches were placed.  The skin was closed with staples.  Attention was then turned to placement of the posterior pedicle screws.  Surface projection of the L3 and L4 pedicle screws were identified under AP fluoroscopy.  Skin incision was then marked out and infiltrated with local anesthetic with epinephrine.  Incision was then made sharply and carried down through subcutaneous tissue.  Jamshidi needles were then introduced into the L3 and L4 pedicles bilaterally under AP and lateral fluoroscopy.  K wires were then placed and the Jamshidi needles were removed.  5.5 x 45 mm screw was placed in the right L3 pedicle with 6.5 x 45 mm screws placed in the remaining pedicles.  Position of the screws was checked with AP and lateral fluoroscopy.  A 50 mm pre-bent lordotic rod was placed on the right and a 45 mm rod on the left.  Setscrews were placed and final tightened.  The reduction towers were removed and final AP and lateral fluoroscopic images were taken confirming good hardware construct.  No active bleeding was identified.  The wounds were then closed with interrupted 0 Vicryl stitches and the skin was closed with staples.  Bacitracin ointment and sterile dressings were applied.  At the end of the case all sponge, needle, instrument, and cottonoid counts were correct.  Patient was then transferred to the stretcher extubated and taken to the postanesthesia care unit in stable hemodynamic condition.   [name], MD [facility]

## 2022-12-13 NOTE — Anesthesia Postprocedure Evaluation (Signed)
Anesthesia Post Note  Patient: Krista [name]  Procedure(s) Performed: PRONE TRANSPSOAS INTERBODY FUSION, LUMBAR THREE-FOUR     Patient location during evaluation: PACU Anesthesia Type: General Level of consciousness: sedated Pain management: pain level controlled Vital Signs Assessment: post-procedure vital signs reviewed and stable Respiratory status: spontaneous breathing and respiratory function stable Cardiovascular status: stable Postop Assessment: no apparent nausea or vomiting Anesthetic complications: no   No notable events documented.  Last Vitals:  Vitals:   12/13/22 1241 12/13/22 1352  BP: 127/85 (!) 162/73  Pulse: 86 73  Resp: 10 16  Temp: 36.8 C 36.5 C  SpO2: 100% 94%    Last Pain:  Vitals:   12/13/22 1204  TempSrc:   PainSc: 5                  Nathalie Cavendish DANIEL

## 2022-12-13 NOTE — H&P (Signed)
Chief Complaint   Back and leg pain  History of Present Illness    Krista [name] is a 72 year old woman I am seeing in follow-up. Briefly, we have been managing back and primarily left-sided leg pain conservatively up until now. This is thought to be related to significant lumbar degenerative disc disease with left-sided foraminal stenosis and spondylosis at L3-4. She has undergone multiple conservative treatments without lasting improvement in symptoms. She therefore elected to proceed with surgical decompression/fusion.    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    Depression    2021   Facial asymmetry, acquired 1974   left side paralysis   GERD (gastroesophageal reflux disease) 2021   H/O: Bell's palsy 1974   per pt during pregnancy , left side paralysis, told possible bell's palsy or stroke, after testing done had left mastoidectomy;  residual left side facialy asymmetry   History of basal cell carcinoma (BCC) excision    History of cerebral aneurysm 2010   s/p coiling  ;  per pt was an incidental finding on imaging   History of Clostridioides difficile infection 2010   treated   History of COVID-19 05/08/2021   positive result in care everywhere; per pt mild to moderate symptoms that resolved   History of Helicobacter pylori infection 2010   treated   Hyperlipidemia    Hypertension    followed by pcp   Lymphocytic colitis    followed by dr c. Shary Key (GI);  dx by biopsy 09-13-2017   Macular degeneration of both eyes    Non-intractable vomiting with nausea    followed by dr Shary Key   OA (osteoarthritis)     Osteoporosis    Pneumonia    PONV (postoperative nausea and vomiting)    Spondylosis, unspecified    cervical and lumbar   Wears hearing aid in both ears     Past Surgical History   Past Surgical History:  Procedure Laterality Date   CARPAL TUNNEL RELEASE Bilateral    early 2000s   CATARACT EXTRACTION W/ INTRAOCULAR LENS IMPLANT Bilateral 2014   CEREBRAL ANEURYSM REPAIR  2010   endocerebral coiling   COLONOSCOPY  09/13/2017   COSMETIC SURGERY     [facility] Surgery on nose   DISTAL INTERPHALANGEAL JOINT FUSION Left 09/16/2021   Procedure: left small finger distal interphalangeal joint fusion;  Surgeon: [name], MD;  Location: [place];  Service: Orthopedics;  Laterality: Left;  with MAC anesthesia   ESOPHAGOGASTRODUODENOSCOPY  09/22/2020   EYE SURGERY  2020   Blepharoplasty   FACIAL RECONSTRUCTION SURGERY  03/28/2022   [facility]   FOOT SURGERY Bilateral    eary 2000s;  bilateral bunionectomy  and removal bone right 5th toe   FRACTURE SURGERY  1968   Shattered right ankle   MASTOIDECTOMY Left 1974   RADIOFREQUENCY ABLATION  09/01/2021   lumbar , left side   ROTATOR CUFF REPAIR Right 2014   SALPINGOOPHORECTOMY Bilateral 1995   w/ excision endometriosis via laparotomy   SPINE SURGERY  2021/2022   DDD, osteoarthritis   TONSILLECTOMY AND ADENOIDECTOMY  1970  TOTAL ABDOMINAL HYSTERECTOMY  1987   TRIGGER FINGER RELEASE Bilateral    early 2000s; one finger each hand   TUBAL LIGATION  1982   WRIST GANGLION EXCISION Left    2000s    Social History   Social History   Tobacco Use   Smoking status: Former    Packs/day: 0.75    Years: 42.00    Total pack years: 31.50    Types: Cigarettes    Quit date: 10/05/2009    Years since quitting: 13.1   Smokeless tobacco: Never  Vaping Use   Vaping Use: Never used  Substance Use Topics   Alcohol use: Not Currently    Alcohol/week: 1.0 standard drink of alcohol    Types: 1 Glasses of wine per week     Comment: Rarely   Drug use: Never    Medications   Prior to Admission medications   Medication Sig Start Date End Date Taking? Authorizing Provider  Calcium-Magnesium-Vitamin D (CITRACAL CALCIUM+D) 600-40-500 MG-MG-UNIT TB24 Take 2 tablets by mouth every evening.   Yes [provider]  cholecalciferol (VITAMIN D3) 25 MCG (1000 UNIT) tablet Take 1,000 Units by mouth every evening.   Yes [provider]  cyclobenzaprine (FLEXERIL) 10 MG tablet TAKE 1/2 TO 1 TABLET BY MOUTH 3 TIMES A DAY AS NEEDED MUSCLE SPASMS Patient taking differently: Take 10 mg by mouth in the morning, at noon, and at bedtime. 02/11/21  Yes Silverio Decamp, MD  diphenhydramine-acetaminophen (TYLENOL PM) 25-500 MG TABS tablet Take 2 tablets by mouth at bedtime.   Yes [provider]  famotidine (PEPCID) 40 MG tablet TAKE 1 TABLET EVERY DAY (APPOINTMENT IS NEEDED FOR REFILLS) Patient taking differently: Take 40 mg by mouth at bedtime. 11/17/21  Yes Breeback, Jade L, PA-C  gabapentin (NEURONTIN) 100 MG capsule Take 100 mg by mouth at bedtime.   Yes [provider]  LUTEIN ESTERS PO Take 1 tablet by mouth at bedtime.   Yes [provider]  magnesium oxide (MAG-OX) 400 (240 Mg) MG tablet Take 400 mg by mouth in the morning.   Yes [provider]  meloxicam (MOBIC) 15 MG tablet Take 1 tablet (15 mg total) by mouth daily. TAKE 1 TABLET BY MOUTH EVERY DAY IN THE MORNING WITH FOOD 11/26/21  Yes Silverio Decamp, MD  valsartan-hydrochlorothiazide (DIOVAN-HCT) 160-12.5 MG tablet Take 1 tablet by mouth daily. Patient taking differently: Take 1 tablet by mouth every evening. 01/28/22  Yes Jessup, Joy, NP  benzonatate (TESSALON) 100 MG capsule Take 1 capsule (100 mg total) by mouth 2 (two) times daily as needed for cough. 12/06/22   Leeanne Rio, MD    Allergies   Allergies  Allergen Reactions   Codeine Other (See Comments)    Hyperactivity "wired" super  hyperactivity      Colestipol Swelling    Lips swollen/NO resp issues   Kiwi Extract Hives   Other Hives, Rash and Other (See Comments)    Nectarines--tongue swells    Plum Pulp Hives   Tape Hives and Rash   Tomato Hives   Colchicine Other (See Comments)    Facial swelling   Prednisone Other (See Comments)    Irritability      Review of Systems  ROS  Neurologic Exam  Awake, alert, oriented Memory and concentration grossly intact Speech fluent, appropriate CN grossly intact Motor exam: Upper Extremities Deltoid Bicep Tricep Grip  Right 5/5 5/5 5/5 5/5  Left 5/5 5/5 5/5 5/5   Lower  Extremities IP Quad PF DF EHL  Right 5/5 5/5 5/5 5/5 5/5  Left 5/5 5/5 5/5 5/5 5/5   Sensation grossly intact to LT  Imaging  MRI reveals significant disc degeneration with loss of height and foraminal stenosis at L3-4  Impression  - 72 y.o. female with back and leg pain related to disc degenerative disease and spondylosis worst at L3-4. She has failed reasonable conservative treatments.  Plan  - Proceed with trans-psoas interbody fusion and percutaneous pedicle screw stabilization  I have reviewed the indications for the procedure as well as the details of the procedure and the expected postoperative course and recovery at length with the patient in the office. We have also reviewed in detail the risks, benefits, and alternatives to the procedure. All questions were answered and Krista [name] provided informed consent to proceed.  [name], MD [facility]

## 2022-12-13 NOTE — Progress Notes (Signed)
Orthopedic Tech Progress Note Patient Details:  Krista [name] 02/13/1951 [id number]  Ortho Devices Type of Ortho Device: Lumbar corsett Ortho Device/Splint Location: BACK Ortho Device/Splint Interventions: Ordered   Post Interventions Patient Tolerated: Well Instructions Provided: Care of device  Janit Pagan 12/13/2022, 2:56 PM

## 2022-12-13 NOTE — Transfer of Care (Signed)
Immediate Anesthesia Transfer of Care Note  Patient: Krista [name]  Procedure(s) Performed: PRONE TRANSPSOAS INTERBODY FUSION, LUMBAR THREE-FOUR  Patient Location: PACU  Anesthesia Type:General  Level of Consciousness: sedated  Airway & Oxygen Therapy: Patient Spontanous Breathing and Patient connected to face mask oxygen  Post-op Assessment: Report given to RN and Post -op Vital signs reviewed and stable  Post vital signs: Reviewed and stable  Last Vitals:  Vitals Value Taken Time  BP 144/72 12/13/22 1111  Temp 98   Pulse 70 12/13/22 1112  Resp 16 12/13/22 1112  SpO2 100 % 12/13/22 1112  Vitals shown include unvalidated device data.  Last Pain:  Vitals:   12/13/22 0646  TempSrc: Oral  PainSc: 8       Patients Stated Pain Goal: 0 (73/22/02 5427)  Complications: No notable events documented.

## 2022-12-13 NOTE — Anesthesia Procedure Notes (Signed)
Procedure Name: Intubation Date/Time: 12/13/2022 8:10 AM  Performed by: Minerva Ends, CRNAPre-anesthesia Checklist: Patient identified, Emergency Drugs available, Suction available and Patient being monitored Patient Re-evaluated:Patient Re-evaluated prior to induction Oxygen Delivery Method: Circle system utilized Preoxygenation: Pre-oxygenation with 100% oxygen Induction Type: IV induction Ventilation: Mask ventilation without difficulty Laryngoscope Size: Mac and 3 Grade View: Grade II Tube type: Oral Tube size: 7.0 mm Number of attempts: 1 Airway Equipment and Method: Stylet and Oral airway Placement Confirmation: ETT inserted through vocal cords under direct vision, positive ETCO2 and breath sounds checked- equal and bilateral Secured at: 21 cm Tube secured with: Tape Dental Injury: Teeth and Oropharynx as per pre-operative assessment

## 2022-12-14 MED ORDER — OXYCODONE HCL 10 MG PO TABS: 10.0000 mg | ORAL_TABLET | Freq: Four times a day (QID) | ORAL | 0 refills | Status: AC | PRN

## 2022-12-14 MED ORDER — ONDANSETRON HCL 4 MG PO TABS: 4.0000 mg | ORAL_TABLET | Freq: Three times a day (TID) | ORAL | 0 refills | Status: DC | PRN

## 2022-12-14 NOTE — Evaluation (Signed)
Occupational Therapy Evaluation Patient Details Name: Krista [name] MRN: [id number] DOB: May 19, 1951 Today's Date: 12/14/2022   History of Present Illness Pt is a 72 y.o. female admitted 12/13/22 for elective L3-4 transpsoas interbody fusion. PMH includes COPD, HTN, HLD, macular degeneration, OA, Bell's Palsy, DDD, anxiety, depression, multiple orthopedic sxs.   Clinical Impression   Krista [name] was evaluated s/p the above back surgery, she is typically indep at baseline and lives with her husband who can assist as needed at d/c. Upon evaluation pt had functional limitations due to back pain, generalized weakness, unsteady gait, decreased activity tolerance and knowledge of precautions and compensatory techniques. Overall pt required min G for transfers and mobility with RW. Due to the deficits listed below, she required up to min A fro LB ADLs. Educated pt and family on compensatory techniques for self care and safety. OT to continue to follow acutely should her LOS continue. Recommend d/c to home with support of family.     Recommendations for follow up therapy are one component of a multi-disciplinary discharge planning process, led by the attending physician.  Recommendations may be updated based on patient status, additional functional criteria and insurance authorization.   Follow Up Recommendations  No OT follow up     Assistance Recommended at Discharge Frequent or constant Supervision/Assistance  Patient can return home with the following A little help with walking and/or transfers;A little help with bathing/dressing/bathroom;Assistance with cooking/housework;Assist for transportation;Help with stairs or ramp for entrance    Functional Status Assessment  Patient has had a recent decline in their functional status and demonstrates the ability to make significant improvements in function in a reasonable and predictable amount of time.  Equipment Recommendations  BSC/3in1;Other (comment) (RW)        Precautions / Restrictions Precautions Precautions: Fall;Back Precaution Booklet Issued: Yes (comment) Required Braces or Orthoses: Spinal Brace Spinal Brace: Lumbar corset;Applied in sitting position;Applied in standing position Restrictions Weight Bearing Restrictions: No      Mobility Bed Mobility Overal bed mobility: Modified Independent                  Transfers Overall transfer level: Needs assistance Equipment used: Rolling walker (2 wheels) Transfers: Sit to/from Stand Sit to Stand: Min guard           General transfer comment: does better from elevated surface. discussed building chairs up at home with pillows      Balance Overall balance assessment: Needs assistance Sitting-balance support: No upper extremity supported, Feet supported Sitting balance-Leahy Scale: Good       Standing balance-Leahy Scale: Fair                             ADL either performed or assessed with clinical judgement   ADL Overall ADL's : Needs assistance/impaired Eating/Feeding: Independent   Grooming: Set up;Sitting   Upper Body Bathing: Minimal assistance;Sitting   Lower Body Bathing: Minimal assistance;Sit to/from stand   Upper Body Dressing : Set up;Sitting   Lower Body Dressing: Minimal assistance;Sit to/from stand Lower Body Dressing Details (indicate cue type and reason): RLE more difficult than LLE Toilet Transfer: Min guard;Rolling walker (2 wheels)   Toileting- Clothing Manipulation and Hygiene: Supervision/safety;Sitting/lateral lean       Functional mobility during ADLs: Min guard;Rolling walker (2 wheels) General ADL Comments: cues for compensatory techniques and precuations     Vision Baseline Vision/History: 0 No visual deficits Vision Assessment?: No apparent visual deficits  Perception Perception Perception Tested?: No   Praxis Praxis Praxis tested?: Not tested    Pertinent Vitals/Pain Pain Assessment Pain  Assessment: Faces Faces Pain Scale: Hurts even more Pain Location: back Pain Descriptors / Indicators: Discomfort, Sore Pain Intervention(s): Limited activity within patient's tolerance, Monitored during session     Hand Dominance     Extremity/Trunk Assessment Upper Extremity Assessment Upper Extremity Assessment: Overall WFL for tasks assessed   Lower Extremity Assessment Lower Extremity Assessment: Defer to PT evaluation   Cervical / Trunk Assessment Cervical / Trunk Assessment: Back Surgery   Communication Communication Communication: No difficulties   Cognition Arousal/Alertness: Awake/alert Behavior During Therapy: WFL for tasks assessed/performed, Flat affect, Anxious Overall Cognitive Status: Within Functional Limits for tasks assessed             General Comments  VSS. some drainage noted at sx sites. husband and daughter present and supportive     Home Living Family/patient expects to be discharged to:: Private residence Living Arrangements: Spouse/significant other Available Help at Discharge: Family;Available 24 hours/day Type of Home: House Home Access: Stairs to enter [employer] of Steps: 2 Entrance Stairs-Rails: Right;Left Home Layout: One level     Bathroom Shower/Tub: Teacher, early years/pre: Standard     Home Equipment: None   Additional Comments: husband and daughter plan to provide 24/7 assist      Prior Functioning/Environment Prior Level of Function : Independent/Modified Independent             Mobility Comments: typically independent without DME; enjoys cleaning/rearranging house and time with dog (who is being boarded first few days for her return home)          OT Problem List: Decreased strength;Decreased range of motion;Decreased activity tolerance;Impaired balance (sitting and/or standing);Decreased safety awareness;Decreased knowledge of use of DME or AE;Decreased knowledge of precautions;Pain       OT Treatment/Interventions: Self-care/ADL training;Therapeutic exercise;DME and/or AE instruction;Therapeutic activities;Patient/family education;Balance training    OT Goals(Current goals can be found in the care plan section) Acute Rehab OT Goals Patient Stated Goal: less pain OT Goal Formulation: With patient Time For Goal Achievement: 12/28/22 Potential to Achieve Goals: Good ADL Goals Additional ADL Goal #1: Pt will complete BADLs with mod I while maintaining back precautions  OT Frequency: Min 2X/week       AM-PAC OT "6 Clicks" Daily Activity     Outcome Measure Help from another person eating meals?: None Help from another person taking care of personal grooming?: A Little Help from another person toileting, which includes using toliet, bedpan, or urinal?: A Little Help from another person bathing (including washing, rinsing, drying)?: A Little Help from another person to put on and taking off regular upper body clothing?: None Help from another person to put on and taking off regular lower body clothing?: A Little 6 Click Score: 20   End of Session Equipment Utilized During Treatment: Rolling walker (2 wheels);Back brace Nurse Communication: Mobility status  Activity Tolerance: Patient tolerated treatment well Patient left: in chair;with call bell/phone within reach;with family/visitor present  OT Visit Diagnosis: Unsteadiness on feet (R26.81);Muscle weakness (generalized) (M62.81);Pain                Time: 0240-9735 OT Time Calculation (min): 25 min Charges:  OT General Charges $OT Visit: 1 Visit OT Evaluation $OT Eval Moderate Complexity: 1 Mod   Krista [name] 12/14/2022, 9:24 AM

## 2022-12-14 NOTE — Discharge Summary (Signed)
Physician Discharge Summary  Patient ID: Krista [name] MRN: [id number] DOB/AGE: August 18, 1951 72 y.o.  Admit date: 12/13/2022 Discharge date: 12/14/2022  Admission Diagnoses:  Lumbar Spondylosis L3-4  Discharge Diagnoses:  Same Principal Problem:   Other spondylosis with radiculopathy, lumbar region   Discharged Condition: Stable  Hospital Course:  Krista [name] is a 72 y.o. female admitted after elective L3-4 lateral interbody fusion with perc pedicle screws. She was at baseline postop, ambulating well, tolerating diet with some nausea, voiding normally. She requested d/c home.  Treatments: Surgery - L3-4 fusion  Discharge Exam: Blood pressure 103/63, pulse 91, temperature 98.2 F (36.8 C), resp. rate 16, height '5\' 5"'$  (1.651 m), weight 57.6 kg, SpO2 97 %. Awake, alert, oriented Speech fluent, appropriate CN grossly intact 5/5 BUE/BLE Wound c/d/i  Disposition: Discharge disposition: 01-Home or Self Care       Discharge Instructions     Call MD for:  redness, tenderness, or signs of infection (pain, swelling, redness, odor or green/yellow discharge around incision site)   Complete by: As directed    Call MD for:  temperature >100.4   Complete by: As directed    Diet - low sodium heart healthy   Complete by: As directed    Discharge instructions   Complete by: As directed    Walk at home as much as possible, at least 4 times / day   Incentive spirometry RT   Complete by: As directed    Increase activity slowly   Complete by: As directed    Lifting restrictions   Complete by: As directed    No lifting > 10 lbs   May shower / Bathe   Complete by: As directed    48 hours after surgery   May walk up steps   Complete by: As directed    Other Restrictions   Complete by: As directed    No bending/twisting at waist   Remove dressing in 24 hours   Complete by: As directed       Allergies as of 12/14/2022       Reactions   Codeine Other (See Comments)    Hyperactivity "wired" super hyperactivity    Colestipol Swelling   Lips swollen/NO resp issues   Kiwi Extract Hives   Other Hives, Rash, Other (See Comments)   Nectarines--tongue swells   Plum Pulp Hives   Tape Hives, Rash   Tomato Hives   Colchicine Other (See Comments)   Facial swelling   Prednisone Other (See Comments)   Irritability         Medication List     TAKE these medications    benzonatate 100 MG capsule Commonly known as: TESSALON Take 1 capsule (100 mg total) by mouth 2 (two) times daily as needed for cough.   cholecalciferol 25 MCG (1000 UNIT) tablet Commonly known as: VITAMIN D3 Take 1,000 Units by mouth every evening.   CITRACAL CALCIUM+D 600-40-500 MG-MG-UNIT Tb24 Generic drug: Calcium-Magnesium-Vitamin D Take 2 tablets by mouth every evening.   cyclobenzaprine 10 MG tablet Commonly known as: FLEXERIL TAKE 1/2 TO 1 TABLET BY MOUTH 3 TIMES A DAY AS NEEDED MUSCLE SPASMS What changed:  how much to take how to take this when to take this additional instructions   diphenhydramine-acetaminophen 25-500 MG Tabs tablet Commonly known as: TYLENOL PM Take 2 tablets by mouth at bedtime.   famotidine 40 MG tablet Commonly known as: PEPCID TAKE 1 TABLET EVERY DAY (APPOINTMENT IS NEEDED FOR REFILLS) What changed: See the new  instructions.   gabapentin 100 MG capsule Commonly known as: NEURONTIN Take 100 mg by mouth at bedtime.   LUTEIN ESTERS PO Take 1 tablet by mouth at bedtime.   magnesium oxide 400 (240 Mg) MG tablet Commonly known as: MAG-OX Take 400 mg by mouth in the morning.   meloxicam 15 MG tablet Commonly known as: MOBIC Take 1 tablet (15 mg total) by mouth daily. TAKE 1 TABLET BY MOUTH EVERY DAY IN THE MORNING WITH FOOD   ondansetron 4 MG tablet Commonly known as: ZOFRAN Take 1 tablet (4 mg total) by mouth every 8 (eight) hours as needed for nausea or vomiting.   Oxycodone HCl 10 MG Tabs Take 1 tablet (10 mg total) by mouth every  6 (six) hours as needed for up to 7 days for severe pain ((score 7 to 10)).   valsartan-hydrochlorothiazide 160-12.5 MG tablet Commonly known as: DIOVAN-HCT Take 1 tablet by mouth daily. What changed: when to take this        Follow-up Information     [name], MD Follow up.   Specialty: Neurosurgery Contact information: [address] [place] [address] [phone]                 Signed: Jairo Ben 12/14/2022, 9:54 AM

## 2022-12-14 NOTE — Plan of Care (Signed)
Pt and family given D/C instructions with verbal understanding. Rx's were sent to the pharmacy by MD. Pt's incision is clean and dry with no sign of infection. Pt's IV was removed prior to D/C. Pt received RW and 3-n-1 from [employer] per MD order. Pt D/C'd home via wheelchair per MD order. Pt is stable @ D/C and has no other needs at this time. Holli Humbles, RN

## 2022-12-14 NOTE — Evaluation (Signed)
Physical Therapy Evaluation & Discharge Patient Details Name: Krista [name] MRN: [id number] DOB: 04/19/1951 Today's Date: 12/14/2022  History of Present Illness  Pt is a 72 y.o. female admitted 12/13/22 for elective L3-4 transpsoas interbody fusion. PMH includes COPD, HTN, HLD, macular degeneration, OA, Bell's Palsy, DDD, anxiety, depression, multiple orthopedic sxs.   Clinical Impression  Patient evaluated by Physical Therapy with no further acute PT needs identified. PTA, pt independent without DME, lives at home with supportive staff, enjoys caring for her dog. Today, pt moving well with RW at supervision-level, able to negotiate stairs with rail support. Educ pt and family re: precautions, positioning, activity recommendations; increased time discussing home set-up and bed/chair options to rest in. All education has been completed and the patient has no further questions. Acute PT is signing off. Thank you for this referral.     Recommendations for follow up therapy are one component of a multi-disciplinary discharge planning process, led by the attending physician.  Recommendations may be updated based on patient status, additional functional criteria and insurance authorization.  Follow Up Recommendations No PT follow up      Assistance Recommended at Discharge Intermittent Supervision/Assistance  Patient can return home with the following  A little help with bathing/dressing/bathroom;Assistance with cooking/housework;Assist for transportation;Help with stairs or ramp for entrance    Equipment Recommendations Rolling walker (2 wheels);BSC/3in1  Recommendations for Other Services   Occupational Therapy   Functional Status Assessment       Precautions / Restrictions Precautions Precautions: Fall;Back Precaution Booklet Issued: Yes (comment) Required Braces or Orthoses: Spinal Brace Spinal Brace: Lumbar corset;Applied in sitting position;Applied in standing  position Restrictions Weight Bearing Restrictions: No      Mobility  Bed Mobility Overal bed mobility: Modified Independent             General bed mobility comments: practiced log roll with bed near flat    Transfers Overall transfer level: Needs assistance Equipment used: Rolling walker (2 wheels) Transfers: Sit to/from Stand Sit to Stand: Supervision           General transfer comment: initial cues for hand placement with good carryover to subsequent trials    Ambulation/Gait Ambulation/Gait assistance: Supervision Gait Distance (Feet): 200 Feet Assistive device: Rolling walker (2 wheels) Gait Pattern/deviations: Step-through pattern, Decreased stride length Gait velocity: Decreased     General Gait Details: slow, guarded gait with RW and supervision for safety  Stairs Stairs: Yes Stairs assistance: Supervision Stair Management: One rail Left, Step to pattern, Forwards Number of Stairs: 4 General stair comments: Ascend/descend stairs with single UE rail support, educ on technique with LE pain  Wheelchair Mobility    Modified Rankin (Stroke Patients Only)       Balance Overall balance assessment: Needs assistance Sitting-balance support: No upper extremity supported, Feet supported Sitting balance-Leahy Scale: Good       Standing balance-Leahy Scale: Fair Standing balance comment: pt opting to stand to opt brace, no significant instability but suggested pt sit to don brace and perform other ADL tasks to reduce fall risk                             Pertinent Vitals/Pain Pain Assessment Pain Assessment: Faces Faces Pain Scale: Hurts a little bit Pain Location: back Pain Descriptors / Indicators: Discomfort, Sore Pain Intervention(s): Monitored during session, Limited activity within patient's tolerance    Home Living Family/patient expects to be discharged to:: Private residence Living Arrangements:  Spouse/significant  other Available Help at Discharge: Family;Available 24 hours/day Type of Home: House Home Access: Stairs to enter Entrance Stairs-Rails: Psychiatric nurse of Steps: 2   Home Layout: One level Home Equipment: None Additional Comments: husband and daughter plan to provide 24/7 assist    Prior Function Prior Level of Function : Independent/Modified Independent             Mobility Comments: typically independent without DME; enjoys cleaning/rearranging house and time with dog (who is being boarded first few days for her return home)       Hand Dominance        Extremity/Trunk Assessment   Upper Extremity Assessment Upper Extremity Assessment: Overall WFL for tasks assessed    Lower Extremity Assessment Lower Extremity Assessment: Overall WFL for tasks assessed    Cervical / Trunk Assessment Cervical / Trunk Assessment: Back Surgery  Communication   Communication: No difficulties  Cognition Arousal/Alertness: Awake/alert Behavior During Therapy: WFL for tasks assessed/performed, Flat affect Overall Cognitive Status: Within Functional Limits for tasks assessed                                          General Comments General comments (skin integrity, edema, etc.): pt's husband and daughter present, supportive and asking helpful questions. educ re: precautions, positioning, bed heights, chair options, activity recommendations, DVT prevention, importance of mobility    Exercises     Assessment/Plan    PT Assessment Patient does not need any further PT services  PT Problem List         PT Treatment Interventions      PT Goals (Current goals can be found in the Care Plan section)  Acute Rehab PT Goals PT Goal Formulation: All assessment and education complete, DC therapy    Frequency       Co-evaluation               AM-PAC PT "6 Clicks" Mobility  Outcome Measure Help needed turning from your back to your side  while in a flat bed without using bedrails?: None Help needed moving from lying on your back to sitting on the side of a flat bed without using bedrails?: None Help needed moving to and from a bed to a chair (including a wheelchair)?: A Little Help needed standing up from a chair using your arms (e.g., wheelchair or bedside chair)?: A Little Help needed to walk in hospital room?: A Little Help needed climbing 3-5 steps with a railing? : A Little 6 Click Score: 20    End of Session Equipment Utilized During Treatment: Back brace Activity Tolerance: Patient tolerated treatment well Patient left: in bed;with call bell/phone within reach (seated EOB to wait for OT) Nurse Communication: Mobility status PT Visit Diagnosis: Other abnormalities of gait and mobility (R26.89);Pain    Time: 3212-2482 PT Time Calculation (min) (ACUTE ONLY): 24 min   Charges:   PT Evaluation $PT Eval Low Complexity: 1 Low PT Treatments $Self Care/Home Management: 8-22      Mabeline Caras, PT, DPT Acute Rehabilitation Services  Personal: [facility] Office: [place] 12/14/2022, 9:11 AM

## 2022-12-15 ENCOUNTER — Telehealth: Payer: Self-pay | Admitting: *Deleted

## 2022-12-15 ENCOUNTER — Encounter: Payer: Self-pay | Admitting: *Deleted

## 2022-12-15 NOTE — Patient Outreach (Signed)
  Care Coordination TOC Note Transition Care Management Unsuccessful Follow-up Telephone Call  Date of discharge and from where:  Wednesday, 12/14/22, [facility]; L3-4 lumbar fusion  Attempts:  1st Attempt  Reason for unsuccessful TCM follow-up call:  Left voice message  Oneta Rack, RN, BSN, CCRN [employer]umnus RN CM Care Coordination/ Transition of [place] Management [phone]: direct office

## 2022-12-16 ENCOUNTER — Telehealth: Payer: Self-pay | Admitting: *Deleted

## 2022-12-16 ENCOUNTER — Encounter: Payer: Self-pay | Admitting: *Deleted

## 2022-12-16 NOTE — Patient Outreach (Signed)
  Care Coordination TOC Note Transition Care Management Follow-up Telephone Call Date of discharge and from where: Wednesday, 12/14/22, [place], L3-4 lumbar fusion How have you been since you were released from the hospital? "I am doing okay.  I am in quite a bit of pain, but it is controlled by the pain medicine they gave me.  My husband is helping me with anything I need help with, but I am able to get up and walk around and I am doing as much walking as I can tolerate, because I know how important it is." Any questions or concerns? No  Items Reviewed: Did the pt receive and understand the discharge instructions provided? Yes  Medications obtained and verified? Yes - verified patient obtained newly prescribed medications after recent surgery; completed full medication review; no discrepancies or concerns identified; patient verified that she self-manages medications and has no questions or concerns around medications Other? No  Any new allergies since your discharge? No  Dietary orders reviewed? Yes Do you have support at home? Yes  husband and daughter providing assistance with ADL's as needed; patient reports she is essentially independent in self- care  Home Care and Equipment/Supplies: Were home health services ordered? no If so, what is the name of the agency? N/A  Has the agency set up a time to come to the patient's home? not applicable Were any new equipment or medical supplies ordered?  Yes: walker, bedside commode, shower chair What is the name of the medical supply agency? "I am not sure" Were you able to get the supplies/equipment? yes Do you have any questions related to the use of the equipment or supplies? No  Functional Questionnaire: (I = Independent and D = Dependent) ADLs: I  husband and daughter providing assistance with ADL's as needed  Bathing/Dressing- I  husband and daughter providing assistance with ADL's as needed  Meal Prep- I  husband and daughter providing  assistance with ADL's as needed  Eating- I  Maintaining continence- I  Transferring/Ambulation- I  husband and daughter providing assistance with ADL's as needed  Managing Meds- I  Follow up appointments reviewed:  PCP Hospital f/u appt confirmed? No  Scheduled to see - on - @ [facility] f/u appt confirmed? Yes  Scheduled to see neurosurgery provider on 01/03/23 @ "I can't remember the exact time, I am laying down and not near my calendar, but it is scheduled and I am going" Are transportation arrangements needed? No  If their condition worsens, is the pt aware to call PCP or go to the Emergency Dept.? Yes Was the patient provided with contact information for the PCP's office or ED? No- patient declined; reports already has contact information for all care providers Was to pt encouraged to call back with questions or concerns? Yes- provided my direct contact information for any questions or concerns should they arise after today's TOC call; today, patient declines need for scheduled care coordination follow up  SDOH assessments and interventions completed:   Yes SDOH Interventions Today    Flowsheet Row Most Recent Value  SDOH Interventions   Food Insecurity Interventions Intervention Not Indicated  Transportation Interventions Intervention Not Indicated  [husband provides transportation]      Care Coordination Interventions:  Provided education around post-op pain management    Encounter Outcome:  Pt. Visit Completed    Oneta Rack, RN, BSN, CCRN Alumnus RN CM Care Coordination/ Transition of [facility] Management [phone]: direct office

## 2022-12-17 ENCOUNTER — Encounter (HOSPITAL_COMMUNITY): Payer: Self-pay | Admitting: Neurosurgery

## 2022-12-18 DIAGNOSIS — Z91048 Other nonmedicinal substance allergy status: Secondary | ICD-10-CM | POA: Diagnosis not present

## 2022-12-18 DIAGNOSIS — Z888 Allergy status to other drugs, medicaments and biological substances status: Secondary | ICD-10-CM | POA: Diagnosis not present

## 2022-12-18 DIAGNOSIS — Z87891 Personal history of nicotine dependence: Secondary | ICD-10-CM | POA: Diagnosis not present

## 2022-12-18 DIAGNOSIS — Z85828 Personal history of other malignant neoplasm of skin: Secondary | ICD-10-CM | POA: Diagnosis not present

## 2022-12-18 DIAGNOSIS — K838 Other specified diseases of biliary tract: Secondary | ICD-10-CM | POA: Diagnosis not present

## 2022-12-18 DIAGNOSIS — Z981 Arthrodesis status: Secondary | ICD-10-CM | POA: Diagnosis not present

## 2022-12-18 DIAGNOSIS — G8918 Other acute postprocedural pain: Secondary | ICD-10-CM | POA: Diagnosis not present

## 2022-12-18 DIAGNOSIS — Z90710 Acquired absence of both cervix and uterus: Secondary | ICD-10-CM | POA: Diagnosis not present

## 2022-12-18 DIAGNOSIS — K5900 Constipation, unspecified: Secondary | ICD-10-CM | POA: Diagnosis not present

## 2022-12-18 DIAGNOSIS — Z91018 Allergy to other foods: Secondary | ICD-10-CM | POA: Diagnosis not present

## 2022-12-18 DIAGNOSIS — Z9889 Other specified postprocedural states: Secondary | ICD-10-CM | POA: Diagnosis not present

## 2022-12-20 ENCOUNTER — Ambulatory Visit (INDEPENDENT_AMBULATORY_CARE_PROVIDER_SITE_OTHER): Payer: Medicare HMO

## 2022-12-20 ENCOUNTER — Encounter: Payer: Self-pay | Admitting: Medical-Surgical

## 2022-12-20 ENCOUNTER — Ambulatory Visit (INDEPENDENT_AMBULATORY_CARE_PROVIDER_SITE_OTHER): Payer: Medicare HMO | Admitting: Family Medicine

## 2022-12-20 ENCOUNTER — Encounter: Payer: Self-pay | Admitting: Family Medicine

## 2022-12-20 VITALS — BP 127/79 | HR 96 | Temp 98.1°F | Ht 65.0 in | Wt 129.2 lb

## 2022-12-20 DIAGNOSIS — K5901 Slow transit constipation: Secondary | ICD-10-CM | POA: Diagnosis not present

## 2022-12-20 DIAGNOSIS — R1084 Generalized abdominal pain: Secondary | ICD-10-CM

## 2022-12-20 DIAGNOSIS — R109 Unspecified abdominal pain: Secondary | ICD-10-CM | POA: Diagnosis not present

## 2022-12-20 NOTE — Progress Notes (Signed)
   Acute Office Visit  Subjective:     Patient ID: Krista [name], female    DOB: [birth year], 72 y.o.   MRN: [id number]  Chief Complaint  Patient presents with   Constipation     12/13/2022- Spinal surgery, Post surgery, 2 days ago was seen in ED. She denies blood in her stool. She also denies nausea or vomiting.      Constipation Associated symptoms include abdominal pain and nausea. Pertinent negatives include no diarrhea, melena or vomiting.  Patient is in today for acute visit.  Pt reports she had surgery on 1/9. She last had bowel movement on 1/7. She reports abdominal pain and bloating. Pt went to ER on 1/14 and was given 4 enemas while she was there. She had a little stool to pass during her stay. She was told to do Miralax a capful in 8 oz 5 times a day on 1/15. Still no bowel movement. Husband is with her today and says she has nausea so not eating much. She ate Mcmu[place]in today. No bowel movement. No vomiting. Not walking much post operatively. Drinking about 4 twelve oz bottles of water daily.  Review of Systems  Gastrointestinal:  Positive for abdominal pain, constipation and nausea. Negative for blood in stool, diarrhea, heartburn, melena and vomiting.       Objective:    BP 127/79   Pulse 96   Temp 98.1 F (36.7 C) (Oral)   Ht '5\' 5"'$  (1.651 m)   Wt 129 lb 3.2 oz (58.6 kg)   SpO2 95%   BMI 21.50 kg/m    Physical Exam Vitals and nursing note reviewed.  Constitutional:      Appearance: Normal appearance. She is normal weight.  HENT:     Head: Normocephalic and atraumatic.  Cardiovascular:     Pulses: Normal pulses.     Heart sounds: Normal heart sounds.  Pulmonary:     Effort: Pulmonary effort is normal.  Abdominal:     General: Abdomen is flat. Bowel sounds are normal. There is distension.     Palpations: There is no mass.     Tenderness: There is abdominal tenderness. There is no guarding.     Hernia: No hernia is present.  Skin:    Capillary Refill:  Capillary refill takes less than 2 seconds.  Neurological:     General: No focal deficit present.     Mental Status: She is alert and oriented to person, place, and time. Mental status is at baseline.  Psychiatric:        Mood and Affect: Mood normal.        Behavior: Behavior normal.        Thought Content: Thought content normal.        Judgment: Judgment normal.   No results found for any visits on 12/20/22.      Assessment & Plan:   Problem List Items Addressed This Visit   None  Send for xray to rule out obstruction.  Pt postop and may have ileus? Advised to drink and increase water and ambulation to help with bowel movement.  Also due to nausea and not eating well, may not have much stool to pass?  To try to cut back on the pain medicines as this also can cause nausea and constipation.  No orders of the defined types were placed in this encounter.   No follow-ups on file.  Leeanne Rio, MD

## 2023-01-04 DIAGNOSIS — M48062 Spinal stenosis, lumbar region with neurogenic claudication: Secondary | ICD-10-CM | POA: Diagnosis not present

## 2023-01-24 ENCOUNTER — Other Ambulatory Visit: Payer: Self-pay | Admitting: Family Medicine

## 2023-01-25 DIAGNOSIS — G510 Bell's palsy: Secondary | ICD-10-CM | POA: Diagnosis not present

## 2023-01-25 DIAGNOSIS — G5133 Clonic hemifacial spasm, bilateral: Secondary | ICD-10-CM | POA: Diagnosis not present

## 2023-02-06 ENCOUNTER — Ambulatory Visit (INDEPENDENT_AMBULATORY_CARE_PROVIDER_SITE_OTHER): Payer: Medicare HMO | Admitting: Medical-Surgical

## 2023-02-06 DIAGNOSIS — Z780 Asymptomatic menopausal state: Secondary | ICD-10-CM

## 2023-02-06 DIAGNOSIS — Z1231 Encounter for screening mammogram for malignant neoplasm of breast: Secondary | ICD-10-CM

## 2023-02-06 DIAGNOSIS — Z0000 Encounter for general adult medical examination without abnormal findings: Secondary | ICD-10-CM

## 2023-02-06 NOTE — Patient Instructions (Signed)
[facility] Maintenance Summary and Written Plan of Care  Ms. Krista [name] ,  Thank you for allowing me to perform your Medicare Annual Wellness Visit and for your ongoing commitment to your health.   Health Maintenance & Immunization History Health Maintenance  Topic Date Due   COVID-19 Vaccine (8 - 2023-24 season) 02/22/2023 (Originally 08/05/2022)   DEXA SCAN  02/06/2024 (Originally 10/21/2022)   Lung Cancer Screening  08/03/2023   MAMMOGRAM  01/07/2024   Medicare Annual Wellness (AWV)  02/06/2024   DTaP/Tdap/Td (2 - Td or Tdap) 06/20/2024   COLONOSCOPY (Pts 45-47yr Insurance coverage will need to be confirmed)  09/14/2027   Pneumonia Vaccine 72 Years old  Completed   INFLUENZA VACCINE  Completed   Hepatitis C Screening  Completed   Zoster Vaccines- Shingrix  Completed   HPV VACCINES  Aged Out   Immunization History  Administered Date(s) Administered   Fluad Quad(high Dose 65+) 08/23/2019, 09/09/2022   Influenza, High Dose Seasonal PF 08/15/2017, 08/24/2018, 09/04/2018   Influenza-Unspecified 08/23/2019, 08/01/2020, 09/08/2021   PFIZER Comirnaty(Gray Top)Covid-19 Tri-Sucrose Vaccine 06/14/2021   PFIZER(Purple Top)SARS-COV-2 Vaccination 01/16/2020, 02/11/2020, 08/01/2020, 09/07/2020, 12/01/2020   Pfizer Covid-19 Vaccine Bivalent Booster 128yr& up 09/08/2021   Pneumococcal Conjugate-13 09/11/2020   Pneumococcal Polysaccharide-23 09/03/2019   Tdap 06/20/2014   Zoster Recombinat (Shingrix) 12/01/2020, 02/02/2021    These are the patient goals that we discussed:  Goals Addressed               This Visit's Progress     Patient Stated (pt-stated)        02/06/2023 AWV Goal: Exercise for General Health  Patient will verbalize understanding of the benefits of increased physical activity: Exercising regularly is important. It will improve your overall fitness, flexibility, and endurance. Regular exercise also will improve your overall health. It can  help you control your weight, reduce stress, and improve your bone density. Over the next year, patient will increase physical activity as tolerated with a goal of at least 150 minutes of moderate physical activity per week.  You can tell that you are exercising at a moderate intensity if your heart starts beating faster and you start breathing faster but can still hold a conversation. Moderate-intensity exercise ideas include: Walking 1 mile (1.6 km) in about 15 minutes Biking Hiking Golfing Dancing Water aerobics Patient will verbalize understanding of everyday activities that increase physical activity by providing examples like the following: Yard work, such as: PuSales promotion account executiveardening Washing windows or floors Patient will be able to explain general safety guidelines for exercising:  Before you start a new exercise program, talk with your health care provider. Do not exercise so much that you hurt yourself, feel dizzy, or get very short of breath. Wear comfortable clothes and wear shoes with good support. Drink plenty of water while you exercise to prevent dehydration or heat stroke. Work out until your breathing and your heartbeat get faster.          This is a list of Health Maintenance Items that are overdue or due now: Screening mammography Bone densitometry screening    Orders/Referrals Placed Today: Orders Placed This Encounter  Procedures   DEXAScan    Standing Status:   Future    Standing Expiration Date:   02/06/2024    Scheduling Instructions:     Please call patient to schedule.    Order Specific Question:  Reason for exam:    Answer:   post menopausal    Order Specific Question:   Preferred imaging location?    Answer:   [facility]   Mammogram 3D SCREEN BREAST BILATERAL    Standing Status:   Future    Standing Expiration Date:   02/06/2024    Scheduling  Instructions:     Please call patient to schedule.    Order Specific Question:   Reason for Exam (SYMPTOM  OR DIAGNOSIS REQUIRED)    Answer:   breast cancer screening    Order Specific Question:   Preferred imaging location?    Answer:   [facility]    (Contact our referral department at [phone] if you have not spoken with someone about your referral appointment within the next 5 days)    Follow-up Plan Follow-up with [name], NP as planned Medicare wellness visit in one year.  Patient will access AVS on my chart.      Health Maintenance, Female Adopting a healthy lifestyle and getting preventive care are important in promoting health and wellness. Ask your health care provider about: The right schedule for you to have regular tests and exams. Things you can do on your own to prevent diseases and keep yourself healthy. What should I know about diet, weight, and exercise? Eat a healthy diet  Eat a diet that includes plenty of vegetables, fruits, low-fat dairy products, and lean protein. Do not eat a lot of foods that are high in solid fats, added sugars, or sodium. Maintain a healthy weight Body mass index (BMI) is used to identify weight problems. It estimates body fat based on height and weight. Your health care provider can help determine your BMI and help you achieve or maintain a healthy weight. Get regular exercise Get regular exercise. This is one of the most important things you can do for your health. Most adults should: Exercise for at least 150 minutes each week. The exercise should increase your heart rate and make you sweat (moderate-intensity exercise). Do strengthening exercises at least twice a week. This is in addition to the moderate-intensity exercise. Spend less time sitting. Even light physical activity can be beneficial. Watch cholesterol and blood lipids Have your blood tested for lipids and cholesterol at 72 years of age, then have this  test every 5 years. Have your cholesterol levels checked more often if: Your lipid or cholesterol levels are high. You are older than 72 years of age. You are at high risk for heart disease. What should I know about cancer screening? Depending on your health history and family history, you may need to have cancer screening at various ages. This may include screening for: Breast cancer. Cervical cancer. Colorectal cancer. Skin cancer. Lung cancer. What should I know about heart disease, diabetes, and high blood pressure? Blood pressure and heart disease High blood pressure causes heart disease and increases the risk of stroke. This is more likely to develop in people who have high blood pressure readings or are overweight. Have your blood pressure checked: Every 3-5 years if you are 7-74 years of age. Every year if you are 58 years old or older. Diabetes Have regular diabetes screenings. This checks your fasting blood sugar level. Have the screening done: Once every three years after age 86 if you are at a normal weight and have a low risk for diabetes. More often and at a younger age if you are overweight or have a high risk for diabetes. What  should I know about preventing infection? Hepatitis B If you have a higher risk for hepatitis B, you should be screened for this virus. Talk with your health care provider to find out if you are at risk for hepatitis B infection. Hepatitis C Testing is recommended for: Everyone born from [birth year] through 1965. Anyone with known risk factors for hepatitis C. Sexually transmitted infections (STIs) Get screened for STIs, including gonorrhea and chlamydia, if: You are sexually active and are younger than 72 years of age. You are older than 72 years of age and your health care provider tells you that you are at risk for this type of infection. Your sexual activity has changed since you were last screened, and you are at increased risk for chlamydia or  gonorrhea. Ask your health care provider if you are at risk. Ask your health care provider about whether you are at high risk for HIV. Your health care provider may recommend a prescription medicine to help prevent HIV infection. If you choose to take medicine to prevent HIV, you should first get tested for HIV. You should then be tested every 3 months for as long as you are taking the medicine. Pregnancy If you are about to stop having your period (premenopausal) and you may become pregnant, seek counseling before you get pregnant. Take 400 to 800 micrograms (mcg) of folic acid every day if you become pregnant. Ask for birth control (contraception) if you want to prevent pregnancy. Osteoporosis and menopause Osteoporosis is a disease in which the bones lose minerals and strength with aging. This can result in bone fractures. If you are 15 years old or older, or if you are at risk for osteoporosis and fractures, ask your health care provider if you should: Be screened for bone loss. Take a calcium or vitamin D supplement to lower your risk of fractures. Be given hormone replacement therapy (HRT) to treat symptoms of menopause. Follow these instructions at home: Alcohol use Do not drink alcohol if: Your health care provider tells you not to drink. You are pregnant, may be pregnant, or are planning to become pregnant. If you drink alcohol: Limit how much you have to: 0-1 drink a day. Know how much alcohol is in your drink. In the [place] one drink equals one 12 oz bottle of beer (355 mL), one 5 oz glass of wine (148 mL), or one 1 oz glass of hard liquor (44 mL). Lifestyle Do not use any products that contain nicotine or tobacco. These products include cigarettes, chewing tobacco, and vaping devices, such as e-cigarettes. If you need help quitting, ask your health care provider. Do not use street drugs. Do not share needles. Ask your health care provider for help if you need support or  information about quitting drugs. General instructions Schedule regular health, dental, and eye exams. Stay current with your vaccines. Tell your health care provider if: You often feel depressed. You have ever been abused or do not feel safe at home. Summary Adopting a healthy lifestyle and getting preventive care are important in promoting health and wellness. Follow your health care provider's instructions about healthy diet, exercising, and getting tested or screened for diseases. Follow your health care provider's instructions on monitoring your cholesterol and blood pressure. This information is not intended to replace advice given to you by your health care provider. Make sure you discuss any questions you have with your health care provider. Document Revised: 04/12/2021 Document Reviewed: 04/12/2021 Elsevier Patient Education  [place].

## 2023-02-06 NOTE — Progress Notes (Signed)
MEDICARE ANNUAL WELLNESS VISIT  02/06/2023  Telephone Visit Disclaimer This Medicare AWV was conducted by telephone due to national recommendations for restrictions regarding the COVID-19 Pandemic (e.g. social distancing).  I verified, using two identifiers, that I am speaking with Krista [name] or their authorized healthcare agent. I discussed the limitations, risks, security, and privacy concerns of performing an evaluation and management service by telephone and the potential availability of an in-person appointment in the future. The patient expressed understanding and agreed to proceed.  Location of Patient: Home Location of Provider (nurse):  In the office.  Subjective:    Krista [name] is a 72 y.o. female patient of Krista Bouche, NP who had a Medicare Annual Wellness Visit today via telephone. Krista [name] is Retired and lives with their spouse. she has 2 children. she reports that she is socially active and does interact with friends/family regularly. she is minimally physically active and enjoys reading, swimming, playing with her grandson and walking.  Patient Care Team: Krista Bouche, NP as PCP - General (Nurse Practitioner) Krista [name], [facility] as Pharmacist (Pharmacist)     02/06/2023   10:32 AM 12/13/2022    6:43 AM 12/08/2022    3:07 PM 08/22/2022   11:08 AM 01/31/2022    9:00 AM 09/16/2021   10:19 AM 09/11/2020    1:10 PM  Advanced Directives  Does Patient Have a Medical Advance Directive? Yes  Yes Yes Yes Yes Yes  Type of Advance Directive Living will  [place];Living will Living will;Healthcare Power of Attorney Living will;Healthcare Power of [place];Living will;Out of facility DNR (pink MOST or yellow form)  Does patient want to make changes to medical advance directive? No - Patient declined   No - Patient declined No - Patient declined No - Patient declined Yes (MAU/Ambulatory/Procedural Areas - Information given)  Copy of  [place] in Chart?  No - copy requested No - copy requested  No - copy requested  No - copy requested    Hospital Utilization Over the Past 12 Months: # of hospitalizations or ER visits: 3 # of surgeries: 2  Review of Systems    Patient reports that her overall health is better compared to last year.  History obtained from chart review and the patient  Patient Reported Readings (BP, Pulse, CBG, Weight, etc) none  Pain Assessment Pain : No/denies pain     Current Medications & Allergies (verified) Allergies as of 02/06/2023       Reactions   Codeine Other (See Comments)   Hyperactivity "wired" super hyperactivity    Colestipol Swelling   Lips swollen/NO resp issues   Kiwi Extract Hives   Other Hives, Rash, Other (See Comments)   Nectarines--tongue swells   Plum Pulp Hives   Tape Hives, Rash   Tomato Hives   Colchicine Other (See Comments)   Facial swelling   Prednisone Other (See Comments)   Irritability         Medication List        Accurate as of February 06, 2023 10:43 AM. If you have any questions, ask your nurse or doctor.          benzonatate 100 MG capsule Commonly known as: TESSALON Take 1 capsule (100 mg total) by mouth 2 (two) times daily as needed for cough.   cholecalciferol 25 MCG (1000 UNIT) tablet Commonly known as: VITAMIN D3 Take 1,000 Units by mouth every evening.   CITRACAL CALCIUM+D  600-40-500 MG-MG-UNIT Tb24 Generic drug: Calcium-Magnesium-Vitamin D Take 2 tablets by mouth every evening.   cyclobenzaprine 10 MG tablet Commonly known as: FLEXERIL TAKE 1/2 TO 1 TABLET BY MOUTH 3 TIMES A DAY AS NEEDED MUSCLE SPASMS What changed:  how much to take how to take this when to take this additional instructions   diphenhydramine-acetaminophen 25-500 MG Tabs tablet Commonly known as: TYLENOL PM Take 2 tablets by mouth at bedtime.   famotidine 40 MG tablet Commonly known as: PEPCID TAKE 1 TABLET EVERY DAY  (APPOINTMENT IS NEEDED FOR REFILLS) What changed: See the new instructions.   gabapentin 100 MG capsule Commonly known as: NEURONTIN Take 100 mg by mouth at bedtime.   LUTEIN ESTERS PO Take 1 tablet by mouth at bedtime.   magnesium oxide 400 (240 Mg) MG tablet Commonly known as: MAG-OX Take 400 mg by mouth in the morning.   meloxicam 15 MG tablet Commonly known as: MOBIC Take 1 tablet (15 mg total) by mouth daily. TAKE 1 TABLET BY MOUTH EVERY DAY IN THE MORNING WITH FOOD   ondansetron 4 MG tablet Commonly known as: ZOFRAN Take 1 tablet (4 mg total) by mouth every 8 (eight) hours as needed for nausea or vomiting.   valsartan-hydrochlorothiazide 160-12.5 MG tablet Commonly known as: DIOVAN-HCT Take 1 tablet by mouth daily. 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    Depression    2021   Facial asymmetry, acquired 1974   left side paralysis   GERD (gastroesophageal reflux disease) 2021   H/O: Bell's palsy 1974   per pt during pregnancy , left side paralysis, told possible bell's palsy or stroke, after testing done had left mastoidectomy;  residual left side facialy asymmetry   History of basal cell carcinoma (BCC) excision    History of cerebral aneurysm 2010   s/p coiling  ;  per pt was an incidental finding on imaging   History of Clostridioides difficile infection 2010   treated   History of COVID-19 05/08/2021   positive result in care everywhere; per pt mild to moderate symptoms that resolved   History of Helicobacter pylori infection 2010   treated    Hyperlipidemia    Hypertension    followed by pcp   Lymphocytic colitis    followed by dr c. Shary Key (GI);  dx by biopsy 09-13-2017   Macular degeneration of both eyes    Non-intractable vomiting with nausea    followed by dr Shary Key   OA (osteoarthritis)    Osteoporosis    Pneumonia    PONV (postoperative nausea and vomiting)    Spondylosis, unspecified    cervical and lumbar   Wears hearing aid in both ears    Past Surgical History:  Procedure Laterality Date   ANTERIOR LAT LUMBAR FUSION N/A 12/13/2022   Procedure: PRONE TRANSPSOAS INTERBODY FUSION, LUMBAR THREE-FOUR;  Surgeon: [name], MD;  Location: [place];  Service: Neurosurgery;  Laterality: N/A;  3C   CARPAL TUNNEL RELEASE Bilateral    early 2000s   CATARACT EXTRACTION W/ INTRAOCULAR  LENS IMPLANT Bilateral 2014   CEREBRAL ANEURYSM REPAIR  2010   endocerebral coiling   COLONOSCOPY  09/13/2017   COSMETIC SURGERY     [facility] Surgery on nose   DISTAL INTERPHALANGEAL JOINT FUSION Left 09/16/2021   Procedure: left small finger distal interphalangeal joint fusion;  Surgeon: [name], MD;  Location: [facility];  Service: Orthopedics;  Laterality: Left;  with MAC anesthesia   ESOPHAGOGASTRODUODENOSCOPY  09/22/2020   EYE SURGERY  2020   Blepharoplasty   FACIAL RECONSTRUCTION SURGERY  03/28/2022   [facility]   FOOT SURGERY Bilateral    eary 2000s;  bilateral bunionectomy  and removal bone right 5th toe   FRACTURE SURGERY  1968   Shattered right ankle   MASTOIDECTOMY Left 1974   RADIOFREQUENCY ABLATION  09/01/2021   lumbar , left side   ROTATOR CUFF REPAIR Right 2014   SALPINGOOPHORECTOMY Bilateral 1995   w/ excision endometriosis via laparotomy   SPINE SURGERY  2021/2022   DDD, osteoarthritis   TONSILLECTOMY AND ADENOIDECTOMY  1970   TOTAL ABDOMINAL HYSTERECTOMY  1987   TRIGGER FINGER RELEASE Bilateral    early 2000s; one finger each hand   TUBAL LIGATION  1982   WRIST GANGLION EXCISION  Left    2000s   Family History  Problem Relation Age of Onset   Heart failure Mother    Arthritis Mother    Hearing loss Mother    Heart disease Mother    Hypertension Mother    Stroke Mother    Varicose Veins Mother    Cancer Father    Alcohol abuse Father    Early death Father    Hearing loss Father    Cancer Brother    Alcohol abuse Brother    ADD / ADHD Son    Alcohol abuse Son    Alcohol abuse Sister    Arthritis Sister    Alcohol abuse Brother    Alcohol abuse Daughter    Arthritis Sister    Cancer Sister    Heart disease Sister    Arthritis Sister    Arthritis Brother    Hearing loss Brother    Social History   Socioeconomic History   Marital status: Married    Spouse name: Randal   Number of children: 2   Years of education: 14   Highest education level: Associate degree: academic program  Occupational History   Occupation: retired    Comment: Retail buyer  Tobacco Use   Smoking status: Former    Packs/day: 0.75    Years: 42.00    Total pack years: 31.50    Types: Cigarettes    Quit date: 10/05/2009    Years since quitting: 13.3   Smokeless tobacco: Never   Tobacco comments:    Quit Date 10/01/2011  Vaping Use   Vaping Use: Never used  Substance and Sexual Activity   Alcohol use: Not Currently    Alcohol/week: 1.0 standard drink of alcohol    Types: 1 Glasses of wine per week    Comment: Rarely   Drug use: Never   Sexual activity: Not Currently    Birth control/protection: Abstinence, None  Other Topics Concern   Not on file  Social History Narrative   Lives with her husband. She enjoys swimming, reading, playing with her grandson and walking.   Social Determinants of Health   Financial Resource Strain: Low Risk  (02/02/2023)   Overall Financial Resource Strain (CARDIA)    Difficulty of  Paying Living Expenses: Not very hard  Food Insecurity: Food Insecurity Present (02/02/2023)   Hunger Vital Sign    Worried About Running Out of  Food in the Last Year: Never true    Ran Out of Food in the Last Year: Sometimes true  Transportation Needs: No Transportation Needs (02/02/2023)   PRAPARE - Hydrologist (Medical): No    Lack of Transportation (Non-Medical): No  Physical Activity: Inactive (02/02/2023)   Exercise Vital Sign    Days of Exercise per Week: 0 days    Minutes of Exercise per Session: 0 min  Stress: No Stress Concern Present (02/02/2023)   [place]    Feeling of Stress : Only a little  Social Connections: Socially Integrated (02/06/2023)   Social Connection and Isolation Panel [NHANES]    Frequency of Communication with Friends and Family: More than three times a week    Frequency of Social Gatherings with Friends and Family: Never    Attends Religious Services: More than 4 times per year    Active Member of [employer] or Organizations: Yes    Attends Archivist Meetings: More than 4 times per year    Marital Status: Married    Activities of Daily Living    02/02/2023    9:30 AM 12/08/2022    3:11 PM  In your present state of health, do you have any difficulty performing the following activities:  Hearing? 0   Vision? 0   Difficulty concentrating or making decisions? 0   Walking or climbing stairs? 0   Dressing or bathing? 0   Doing errands, shopping? 0 0  Preparing Food and eating ? N   Using the Toilet? N   In the past six months, have you accidently leaked urine? N   Do you have problems with loss of bowel control? N   Managing your Medications? N   Managing your Finances? N   Housekeeping or managing your Housekeeping? N     Patient Education/ Literacy How often do you need to have someone help you when you read instructions, pamphlets, or other written materials from your doctor or pharmacy?: 1 - Never What is the last grade level you completed in school?: associates degree  Exercise Current  Exercise Habits: The patient does not participate in regular exercise at present, Exercise limited by: orthopedic condition(s) (recent surgery)  Diet Patient reports consuming 3 meals a day and 0 snack(s) a day Patient reports that her primary diet is: Regular Patient reports that she does have regular access to food.   Depression Screen    02/06/2023   10:33 AM 01/31/2022    9:01 AM 12/31/2021    1:09 PM 07/27/2021   10:35 AM 06/09/2021    9:38 AM 09/11/2020    1:11 PM 09/03/2019    1:09 PM  PHQ 2/9 Scores  PHQ - 2 Score 0 '3 1 1 3 '$ 0 0  PHQ- 9 Score  '12  10   2     '$ Fall Risk    02/06/2023   10:33 AM 02/02/2023    9:30 AM 01/31/2022    9:01 AM 01/27/2022    9:47 AM 12/31/2021    1:08 PM  Fall Risk   Falls in the past year? 0 0 '1 1 1  '$ Number falls in past yr: 0 0 0 0 0  Injury with Fall? 0 0 0 0 0  Risk for fall  due to : No Fall Risks  No Fall Risks  History of fall(s)  Follow up Falls evaluation completed  Falls evaluation completed  Falls evaluation completed     Objective:  Shawnte Rogel seemed alert and oriented and she participated appropriately during our telephone visit.  Blood Pressure Weight BMI  BP Readings from Last 3 Encounters:  12/20/22 127/79  12/14/22 103/63  12/08/22 (!) 145/75   Wt Readings from Last 3 Encounters:  12/20/22 129 lb 3.2 oz (58.6 kg)  12/13/22 127 lb (57.6 kg)  12/08/22 127 lb 6.4 oz (57.8 kg)   BMI Readings from Last 1 Encounters:  12/20/22 21.50 kg/m    *Unable to obtain current vital signs, weight, and BMI due to telephone visit type  Hearing/Vision  Smt. did not seem to have difficulty with hearing/understanding during the telephone conversation Reports that she has had a formal eye exam by an eye care professional within the past year Reports that she has not had a formal hearing evaluation within the past year *Unable to fully assess hearing and vision during telephone visit type  Cognitive Function:    02/06/2023   10:38 AM  01/31/2022    9:12 AM 09/11/2020    1:13 PM 09/03/2019    1:13 PM 08/21/2018    3:18 PM  6CIT Screen  What Year? 0 points 0 points 0 points 0 points 0 points  What month? 0 points 0 points 0 points 0 points 0 points  What time? 0 points 0 points 0 points 0 points 0 points  Count back from 20 0 points 0 points 0 points 0 points 0 points  Months in reverse 0 points 0 points 0 points 0 points 0 points  Repeat phrase 0 points 0 points 0 points 2 points 0 points  Total Score 0 points 0 points 0 points 2 points 0 points   (Normal:0-7, Significant for Dysfunction: >8)  Normal Cognitive Function Screening: Yes   Immunization & Health Maintenance Record Immunization History  Administered Date(s) Administered   Fluad Quad(high Dose 65+) 08/23/2019, 09/09/2022   Influenza, High Dose Seasonal PF 08/15/2017, 08/24/2018, 09/04/2018   Influenza-Unspecified 08/23/2019, 08/01/2020, 09/08/2021   PFIZER Comirnaty(Gray Top)Covid-19 Tri-Sucrose Vaccine 06/14/2021   PFIZER(Purple Top)SARS-COV-2 Vaccination 01/16/2020, 02/11/2020, 08/01/2020, 09/07/2020, 12/01/2020   Pfizer Covid-19 Vaccine Bivalent Booster 61yr & up 09/08/2021   Pneumococcal Conjugate-13 09/11/2020   Pneumococcal Polysaccharide-23 09/03/2019   Tdap 06/20/2014   Zoster Recombinat (Shingrix) 12/01/2020, 02/02/2021    Health Maintenance  Topic Date Due   COVID-19 Vaccine (8 - 2023-24 season) 02/22/2023 (Originally 08/05/2022)   DEXA SCAN  02/06/2024 (Originally 10/21/2022)   Lung Cancer Screening  08/03/2023   MAMMOGRAM  01/07/2024   Medicare Annual Wellness (AWV)  02/06/2024   DTaP/Tdap/Td (2 - Td or Tdap) 06/20/2024   COLONOSCOPY (Pts 45-490yrInsurance coverage will need to be confirmed)  09/14/2027   Pneumonia Vaccine 6581Years old  Completed   INFLUENZA VACCINE  Completed   Hepatitis C Screening  Completed   Zoster Vaccines- Shingrix  Completed   HPV VACCINES  Aged Out       Assessment  This is a routine wellness  examination for [employer] Health Maintenance: Due or Overdue There are no preventive care reminders to display for this patient.   NaMariane Duvaloes not need a referral for Community Assistance: Care Management:   no Social Work:    no Prescription Assistance:  no Nutrition/Diabetes Education:  no   Plan:  Personalized Goals  Goals Addressed               This Visit's Progress     Patient Stated (pt-stated)        02/06/2023 AWV Goal: Exercise for General Health  Patient will verbalize understanding of the benefits of increased physical activity: Exercising regularly is important. It will improve your overall fitness, flexibility, and endurance. Regular exercise also will improve your overall health. It can help you control your weight, reduce stress, and improve your bone density. Over the next year, patient will increase physical activity as tolerated with a goal of at least 150 minutes of moderate physical activity per week.  You can tell that you are exercising at a moderate intensity if your heart starts beating faster and you start breathing faster but can still hold a conversation. Moderate-intensity exercise ideas include: Walking 1 mile (1.6 km) in about 15 minutes Biking Hiking Golfing Dancing Water aerobics Patient will verbalize understanding of everyday activities that increase physical activity by providing examples like the following: Yard work, such as: Sales promotion account executive Gardening Washing windows or floors Patient will be able to explain general safety guidelines for exercising:  Before you start a new exercise program, talk with your health care provider. Do not exercise so much that you hurt yourself, feel dizzy, or get very short of breath. Wear comfortable clothes and wear shoes with good support. Drink plenty of water while you exercise to prevent dehydration or  heat stroke. Work out until your breathing and your heartbeat get faster.        Personalized Health Maintenance & Screening Recommendations  Screening mammography Bone densitometry screening  Lung Cancer Screening Recommended: yes; ordered already placed; due in August.  (Low Dose CT Chest recommended if Age 3-80 years, 30 pack-year currently smoking OR have quit w/in past 15 years) Hepatitis C Screening recommended: no HIV Screening recommended: no  Advanced Directives: Written information was not prepared per patient's request.  Referrals & Orders Orders Placed This Encounter  Procedures   [place] Follow-up with Krista Bouche, NP as planned Medicare wellness visit in one year.  Patient will access AVS on my chart.   I have personally reviewed and noted the following in the patient's chart:   Medical and social history Use of alcohol, tobacco or illicit drugs  Current medications and supplements Functional ability and status Nutritional status Physical activity Advanced directives List of other physicians Hospitalizations, surgeries, and ER visits in previous 12 months Vitals Screenings to include cognitive, depression, and falls Referrals and appointments  In addition, I have reviewed and discussed with Krista [name] certain preventive protocols, quality metrics, and best practice recommendations. A written personalized care plan for preventive services as well as general preventive health recommendations is available and can be mailed to the patient at her request.      Tinnie Gens, RN BSN  02/06/2023

## 2023-02-10 ENCOUNTER — Other Ambulatory Visit: Payer: Self-pay | Admitting: Medical-Surgical

## 2023-02-10 ENCOUNTER — Other Ambulatory Visit: Payer: Self-pay | Admitting: Sports Medicine

## 2023-02-10 DIAGNOSIS — M47816 Spondylosis without myelopathy or radiculopathy, lumbar region: Secondary | ICD-10-CM

## 2023-02-10 NOTE — Telephone Encounter (Signed)
Written by historical provider 

## 2023-02-13 ENCOUNTER — Encounter: Payer: Self-pay | Admitting: Medical-Surgical

## 2023-02-28 ENCOUNTER — Ambulatory Visit (INDEPENDENT_AMBULATORY_CARE_PROVIDER_SITE_OTHER): Payer: Medicare HMO | Admitting: Medical-Surgical

## 2023-02-28 ENCOUNTER — Encounter: Payer: Self-pay | Admitting: Medical-Surgical

## 2023-02-28 VITALS — BP 128/74 | HR 75 | Resp 20 | Ht 65.0 in | Wt 122.8 lb

## 2023-02-28 DIAGNOSIS — M1612 Unilateral primary osteoarthritis, left hip: Secondary | ICD-10-CM

## 2023-02-28 DIAGNOSIS — R634 Abnormal weight loss: Secondary | ICD-10-CM

## 2023-02-28 DIAGNOSIS — Z23 Encounter for immunization: Secondary | ICD-10-CM | POA: Diagnosis not present

## 2023-02-28 DIAGNOSIS — E782 Mixed hyperlipidemia: Secondary | ICD-10-CM | POA: Diagnosis not present

## 2023-02-28 DIAGNOSIS — Z13220 Encounter for screening for lipoid disorders: Secondary | ICD-10-CM | POA: Diagnosis not present

## 2023-02-28 DIAGNOSIS — R7309 Other abnormal glucose: Secondary | ICD-10-CM | POA: Diagnosis not present

## 2023-02-28 DIAGNOSIS — Z0000 Encounter for general adult medical examination without abnormal findings: Secondary | ICD-10-CM

## 2023-02-28 HISTORY — DX: Hypercalcemia: E83.52

## 2023-02-28 HISTORY — DX: Abnormal weight loss: R63.4

## 2023-02-28 HISTORY — DX: Encounter for general adult medical examination without abnormal findings: Z00.00

## 2023-02-28 HISTORY — DX: Unilateral primary osteoarthritis, left hip: M16.12

## 2023-02-28 NOTE — Progress Notes (Signed)
Complete physical exam  Patient: Krista [name]   DOB: [birth year]   72 y.o. Female  MRN: [id number]  Subjective:    Chief Complaint  Patient presents with   Annual Exam   Krista [name] is a 72 y.o. female who presents today for a complete physical exam. She reports consuming a general diet. The patient does not participate in regular exercise at present. She generally feels fairly well. She reports sleeping poorly. She does not have additional problems to discuss today.    Most recent fall risk assessment:    02/28/2023    3:10 PM  [place] in the past year? 0  Number falls in past yr: 0  Injury with Fall? 0  Risk for fall due to : No Fall Risks  Follow up Falls evaluation completed     Most recent depression screenings:    02/28/2023    3:11 PM 02/06/2023   10:33 AM  PHQ 2/9 Scores  PHQ - 2 Score 0 0    Vision:Within last year and Dental: No current dental problems and Receives regular dental care    Patient Care Team: [name], NP as PCP - General (Nurse Practitioner) Darius Bump, [facility] as Pharmacist (Pharmacist)   Outpatient Medications Prior to Visit  Medication Sig   Calcium-Magnesium-Vitamin D (CITRACAL CALCIUM+D) 600-40-500 MG-MG-UNIT TB24 Take 2 tablets by mouth every evening.   cholecalciferol (VITAMIN D3) 25 MCG (1000 UNIT) tablet Take 1,000 Units by mouth every evening.   cyclobenzaprine (FLEXERIL) 10 MG tablet TAKE 1/2 TO 1 TABLET BY MOUTH 3 TIMES A DAY AS NEEDED MUSCLE SPASMS (Patient taking differently: Take 10 mg by mouth in the morning, at noon, and at bedtime.)   diphenhydramine-acetaminophen (TYLENOL PM) 25-500 MG TABS tablet Take 2 tablets by mouth at bedtime.   famotidine (PEPCID) 40 MG tablet TAKE 1 TABLET EVERY DAY (APPOINTMENT IS NEEDED FOR REFILLS) (Patient taking differently: Take 40 mg by mouth at bedtime.)   LUTEIN ESTERS PO Take 1 tablet by mouth at bedtime.   magnesium oxide (MAG-OX) 400 (240 Mg) MG tablet Take 400 mg by mouth  in the morning.   valsartan-hydrochlorothiazide (DIOVAN-HCT) 160-12.5 MG tablet TAKE 1 TABLET BY MOUTH EVERY DAY   [DISCONTINUED] benzonatate (TESSALON) 100 MG capsule Take 1 capsule (100 mg total) by mouth 2 (two) times daily as needed for cough. (Patient not taking: Reported on 12/20/2022)   [DISCONTINUED] gabapentin (NEURONTIN) 300 MG capsule TAKE 1 CAPSULE BY MOUTH EVERYDAY AT BEDTIME   [DISCONTINUED] meloxicam (MOBIC) 15 MG tablet Take 1 tablet (15 mg total) by mouth daily. TAKE 1 TABLET BY MOUTH EVERY DAY IN THE MORNING WITH FOOD   [DISCONTINUED] ondansetron (ZOFRAN) 4 MG tablet Take 1 tablet (4 mg total) by mouth every 8 (eight) hours as needed for nausea or vomiting.   No facility-administered medications prior to visit.    Review of Systems  Constitutional:  Positive for weight loss. Negative for chills, fever and malaise/fatigue.  HENT:  Positive for hearing loss. Negative for congestion, ear pain, sinus pain and sore throat.   Eyes:  Negative for blurred vision, photophobia and pain.  Respiratory:  Negative for cough, shortness of breath and wheezing.   Cardiovascular:  Negative for chest pain, palpitations and leg swelling.  Gastrointestinal:  Negative for abdominal pain, blood in stool, constipation, diarrhea, heartburn, melena, nausea and vomiting.  Genitourinary:  Negative for dysuria, frequency and urgency.  Musculoskeletal:  Negative for falls and neck pain.  Skin:  Negative for itching and rash.  Neurological:  Positive for focal weakness (chronic left facial paralysis). Negative for dizziness, weakness and headaches.  Endo/Heme/Allergies:  Positive for environmental allergies. Negative for polydipsia. Does not bruise/bleed easily.  Psychiatric/Behavioral:  Negative for depression, substance abuse and suicidal ideas. The patient has insomnia (sleep onset issues). The patient is not nervous/anxious.      Objective:    BP 128/74 (BP Location: Right Arm, Cuff Size: Normal)    Pulse 75   Resp 20   Ht 5\' 5"  (1.651 m)   Wt 122 lb 12.8 oz (55.7 kg)   SpO2 98%   BMI 20.43 kg/m    Physical Exam Constitutional:      General: She is not in acute distress.    Appearance: Normal appearance. She is not ill-appearing.  HENT:     Head: Normocephalic and atraumatic.     Right Ear: Tympanic membrane, ear canal and external ear normal. There is no impacted cerumen.     Left Ear: Tympanic membrane, ear canal and external ear normal. There is no impacted cerumen.     Nose: Nose normal. No congestion or rhinorrhea.     Mouth/Throat:     Mouth: Mucous membranes are moist.     Pharynx: No oropharyngeal exudate or posterior oropharyngeal erythema.  Eyes:     General: No scleral icterus.       Right eye: No discharge.        Left eye: No discharge.     Extraocular Movements: Extraocular movements intact.     Conjunctiva/sclera: Conjunctivae normal.     Pupils: Pupils are equal, round, and reactive to light.  Neck:     Thyroid: No thyromegaly.     Vascular: No carotid bruit or JVD.     Trachea: Trachea normal.  Cardiovascular:     Rate and Rhythm: Normal rate and regular rhythm.     Pulses: Normal pulses.     Heart sounds: Normal heart sounds. No murmur heard.    No friction rub. No gallop.  Pulmonary:     Effort: Pulmonary effort is normal. No respiratory distress.     Breath sounds: Normal breath sounds. No wheezing.  Abdominal:     General: Bowel sounds are normal. There is no distension.     Palpations: Abdomen is soft.     Tenderness: There is no abdominal tenderness. There is no guarding.  Musculoskeletal:        General: Normal range of motion.     Cervical back: Normal range of motion and neck supple.  Lymphadenopathy:     Cervical: No cervical adenopathy.  Skin:    General: Skin is warm and dry.  Neurological:     Mental Status: She is alert and oriented to person, place, and time.     Cranial Nerves: No cranial nerve deficit.     Comments:  Left-sided facial paralysis  Psychiatric:        Mood and Affect: Mood normal.        Behavior: Behavior normal.        Thought Content: Thought content normal.        Judgment: Judgment normal.      No results found for any visits on 02/28/23.     Assessment & Plan:    Routine Health Maintenance and Physical Exam  Immunization History  Administered Date(s) Administered   COVID-19, mRNA, vaccine(Comirnaty)12 years and older 02/28/2023   Fluad Quad(high Dose 65+) 08/23/2019, 09/09/2022   Influenza,  High Dose Seasonal PF 08/15/2017, 08/24/2018, 09/04/2018   Influenza-Unspecified 08/23/2019, 08/01/2020, 09/08/2021   PFIZER Comirnaty(Gray Top)Covid-19 Tri-Sucrose Vaccine 06/14/2021   PFIZER(Purple Top)SARS-COV-2 Vaccination 01/16/2020, 02/11/2020, 08/01/2020, 09/07/2020, 12/01/2020   Pfizer Covid-19 Vaccine Bivalent Booster 63yrs & up 09/08/2021   Pneumococcal Conjugate-13 09/11/2020   Pneumococcal Polysaccharide-23 09/03/2019   Tdap 06/20/2014   Zoster Recombinat (Shingrix) 12/01/2020, 02/02/2021    Health Maintenance  Topic Date Due   DEXA SCAN  02/06/2024 (Originally 10/21/2022)   Lung Cancer Screening  08/03/2023   MAMMOGRAM  01/07/2024   Medicare Annual Wellness (AWV)  02/06/2024   DTaP/Tdap/Td (2 - Td or Tdap) 06/20/2024   COLONOSCOPY (Pts 45-55yrs Insurance coverage will need to be confirmed)  09/14/2027   Pneumonia Vaccine 38+ Years old  Completed   INFLUENZA VACCINE  Completed   COVID-19 Vaccine  Completed   Hepatitis C Screening  Completed   Zoster Vaccines- Shingrix  Completed   HPV VACCINES  Aged Out    Discussed health benefits of physical activity, and encouraged her to engage in regular exercise appropriate for her age and condition.  Annual physical exam Checking labs as below. UTD on preventative care. Wellness information provided with AVS.  Hypercalcemia Most recent labs showed hypercalcemia with a calcium level of 11.3.  She does take calcium  supplements daily due to a history of osteoporosis.  Rechecking calcium and adding PTH.  Lipid screening Checking lipid panel today.  Need for COVID-19 vaccine COVID-vaccine updated today.  Unintentional weight loss Since her surgery in January, she notes that she has lost approximately 12 pounds unintentionally.  Unclear if this is related to stress, poor appetite, or other issue.  Checking TSH and hemoglobin A1c today.  Return in about 1 year (around 02/28/2024) for annual physical exam or sooner if needed.   [name], NP

## 2023-02-28 NOTE — Assessment & Plan Note (Signed)
Checking lipid panel today. 

## 2023-02-28 NOTE — Assessment & Plan Note (Signed)
Most recent labs showed hypercalcemia with a calcium level of 11.3.  She does take calcium supplements daily due to a history of osteoporosis.  Rechecking calcium and adding PTH.

## 2023-02-28 NOTE — Assessment & Plan Note (Signed)
COVID-vaccine updated today.

## 2023-02-28 NOTE — Assessment & Plan Note (Signed)
Since her surgery in January, she notes that she has lost approximately 12 pounds unintentionally.  Unclear if this is related to stress, poor appetite, or other issue.  Checking TSH and hemoglobin A1c today.

## 2023-02-28 NOTE — Assessment & Plan Note (Signed)
Checking labs as below. UTD on preventative care. Wellness information provided with AVS.

## 2023-03-01 ENCOUNTER — Encounter: Payer: Self-pay | Admitting: Medical-Surgical

## 2023-03-01 DIAGNOSIS — E782 Mixed hyperlipidemia: Secondary | ICD-10-CM

## 2023-03-01 LAB — CBC WITH DIFFERENTIAL/PLATELET
Absolute Monocytes: 641 cells/uL (ref 200–950)
Basophils Absolute: 61 cells/uL (ref 0–200)
Basophils Relative: 1 %
Eosinophils Absolute: 98 cells/uL (ref 15–500)
Eosinophils Relative: 1.6 %
HCT: 34.2 % — ABNORMAL LOW (ref 35.0–45.0)
Hemoglobin: 11.5 g/dL — ABNORMAL LOW (ref 11.7–15.5)
Lymphs Abs: 2080 cells/uL (ref 850–3900)
MCH: 30.6 pg (ref 27.0–33.0)
MCHC: 33.6 g/dL (ref 32.0–36.0)
MCV: 91 fL (ref 80.0–100.0)
MPV: 9.3 fL (ref 7.5–12.5)
Monocytes Relative: 10.5 %
Neutro Abs: 3221 cells/uL (ref 1500–7800)
Neutrophils Relative %: 52.8 %
Platelets: 334 10*3/uL (ref 140–400)
RBC: 3.76 10*6/uL — ABNORMAL LOW (ref 3.80–5.10)
RDW: 13.8 % (ref 11.0–15.0)
Total Lymphocyte: 34.1 %
WBC: 6.1 10*3/uL (ref 3.8–10.8)

## 2023-03-01 LAB — COMPLETE METABOLIC PANEL WITH GFR
AG Ratio: 1.7 (calc) (ref 1.0–2.5)
ALT: 13 U/L (ref 6–29)
AST: 20 U/L (ref 10–35)
Albumin: 4.9 g/dL (ref 3.6–5.1)
Alkaline phosphatase (APISO): 71 U/L (ref 37–153)
BUN: 17 mg/dL (ref 7–25)
CO2: 31 mmol/L (ref 20–32)
Calcium: 11.3 mg/dL — ABNORMAL HIGH (ref 8.6–10.4)
Chloride: 101 mmol/L (ref 98–110)
Creat: 0.96 mg/dL (ref 0.60–1.00)
Globulin: 2.9 g/dL (calc) (ref 1.9–3.7)
Glucose, Bld: 90 mg/dL (ref 65–99)
Potassium: 3.6 mmol/L (ref 3.5–5.3)
Sodium: 141 mmol/L (ref 135–146)
Total Bilirubin: 0.4 mg/dL (ref 0.2–1.2)
Total Protein: 7.8 g/dL (ref 6.1–8.1)
eGFR: 63 mL/min/{1.73_m2} (ref >=60)

## 2023-03-01 LAB — LIPID PANEL
Cholesterol: 323 mg/dL — ABNORMAL HIGH (ref <200)
HDL: 81 mg/dL (ref >=50)
LDL Cholesterol (Calc): 201 mg/dL (calc) — ABNORMAL HIGH
Non-HDL Cholesterol (Calc): 242 mg/dL (calc) — ABNORMAL HIGH (ref <130)
Total CHOL/HDL Ratio: 4 (calc) (ref <5.0)
Triglycerides: 218 mg/dL — ABNORMAL HIGH (ref <150)

## 2023-03-01 LAB — PTH, INTACT AND CALCIUM
Calcium: 11.3 mg/dL — ABNORMAL HIGH (ref 8.6–10.4)
PTH: 50 pg/mL (ref 16–77)

## 2023-03-01 LAB — HEMOGLOBIN A1C
Hgb A1c MFr Bld: 5.6 % of total Hgb (ref <5.7)
Mean Plasma Glucose: 114 mg/dL
eAG (mmol/L): 6.3 mmol/L

## 2023-03-01 LAB — TSH: TSH: 0.62 mIU/L (ref 0.40–4.50)

## 2023-03-01 MED ORDER — ATORVASTATIN CALCIUM 20 MG PO TABS: 20.0000 mg | ORAL_TABLET | Freq: Every day | ORAL | 3 refills | Status: DC

## 2023-03-07 DIAGNOSIS — K52832 Lymphocytic colitis: Secondary | ICD-10-CM | POA: Diagnosis not present

## 2023-03-22 ENCOUNTER — Ambulatory Visit (INDEPENDENT_AMBULATORY_CARE_PROVIDER_SITE_OTHER): Payer: Medicare HMO

## 2023-03-22 ENCOUNTER — Encounter: Payer: Self-pay | Admitting: Medical-Surgical

## 2023-03-22 ENCOUNTER — Other Ambulatory Visit: Payer: Self-pay | Admitting: Medical-Surgical

## 2023-03-22 DIAGNOSIS — Z780 Asymptomatic menopausal state: Secondary | ICD-10-CM | POA: Diagnosis not present

## 2023-03-22 DIAGNOSIS — Z0000 Encounter for general adult medical examination without abnormal findings: Secondary | ICD-10-CM

## 2023-03-22 DIAGNOSIS — M810 Age-related osteoporosis without current pathological fracture: Secondary | ICD-10-CM | POA: Diagnosis not present

## 2023-03-22 DIAGNOSIS — Z1231 Encounter for screening mammogram for malignant neoplasm of breast: Secondary | ICD-10-CM

## 2023-03-23 NOTE — Telephone Encounter (Signed)
Labs have been done in the last 3 weeks.  Would like to go ahead and get her scheduled to resume Prolia injections every 6 months.

## 2023-03-24 ENCOUNTER — Telehealth: Payer: Self-pay

## 2023-03-24 NOTE — Telephone Encounter (Signed)
Initiated Prior authorization [id number] /ML syringes prior auth Via: Called [employer]  Case/Key:n/a Status: Pending as of 03/24/23 Reason:Covered tier 4 drug estimated co-pay is $90 Notified Pt via: Mychart  [phone] [employer] direct line

## 2023-03-31 ENCOUNTER — Other Ambulatory Visit: Payer: Self-pay | Admitting: Medical-Surgical

## 2023-05-03 DIAGNOSIS — M47816 Spondylosis without myelopathy or radiculopathy, lumbar region: Secondary | ICD-10-CM | POA: Diagnosis not present

## 2023-05-09 ENCOUNTER — Encounter: Payer: Self-pay | Admitting: Medical-Surgical

## 2023-05-09 DIAGNOSIS — H26491 Other secondary cataract, right eye: Secondary | ICD-10-CM | POA: Diagnosis not present

## 2023-05-09 DIAGNOSIS — E782 Mixed hyperlipidemia: Secondary | ICD-10-CM | POA: Diagnosis not present

## 2023-05-10 ENCOUNTER — Other Ambulatory Visit: Payer: Self-pay | Admitting: Medical-Surgical

## 2023-05-10 ENCOUNTER — Ambulatory Visit (INDEPENDENT_AMBULATORY_CARE_PROVIDER_SITE_OTHER): Payer: Medicare HMO | Admitting: Family Medicine

## 2023-05-10 VITALS — BP 128/80 | HR 89 | Temp 98.0°F

## 2023-05-10 DIAGNOSIS — R300 Dysuria: Secondary | ICD-10-CM

## 2023-05-10 DIAGNOSIS — N3000 Acute cystitis without hematuria: Secondary | ICD-10-CM

## 2023-05-10 DIAGNOSIS — G510 Bell's palsy: Secondary | ICD-10-CM | POA: Diagnosis not present

## 2023-05-10 DIAGNOSIS — G5133 Clonic hemifacial spasm, bilateral: Secondary | ICD-10-CM | POA: Diagnosis not present

## 2023-05-10 LAB — COMPLETE METABOLIC PANEL WITH GFR
AG Ratio: 1.8 (calc) (ref 1.0–2.5)
ALT: 15 U/L (ref 6–29)
AST: 20 U/L (ref 10–35)
Albumin: 4.7 g/dL (ref 3.6–5.1)
Alkaline phosphatase (APISO): 82 U/L (ref 37–153)
BUN/Creatinine Ratio: 22 (calc) (ref 6–22)
BUN: 23 mg/dL (ref 7–25)
CO2: 28 mmol/L (ref 20–32)
Calcium: 10.6 mg/dL — ABNORMAL HIGH (ref 8.6–10.4)
Chloride: 97 mmol/L — ABNORMAL LOW (ref 98–110)
Creat: 1.05 mg/dL — ABNORMAL HIGH (ref 0.60–1.00)
Globulin: 2.6 g/dL (calc) (ref 1.9–3.7)
Glucose, Bld: 90 mg/dL (ref 65–99)
Potassium: 4.2 mmol/L (ref 3.5–5.3)
Sodium: 136 mmol/L (ref 135–146)
Total Bilirubin: 0.4 mg/dL (ref 0.2–1.2)
Total Protein: 7.3 g/dL (ref 6.1–8.1)
eGFR: 57 mL/min/{1.73_m2} — ABNORMAL LOW (ref >=60)

## 2023-05-10 LAB — LIPID PANEL
Cholesterol: 220 mg/dL — ABNORMAL HIGH (ref <200)
HDL: 69 mg/dL (ref >=50)
LDL Cholesterol (Calc): 118 mg/dL (calc) — ABNORMAL HIGH
Non-HDL Cholesterol (Calc): 151 mg/dL (calc) — ABNORMAL HIGH (ref <130)
Total CHOL/HDL Ratio: 3.2 (calc) (ref <5.0)
Triglycerides: 214 mg/dL — ABNORMAL HIGH (ref <150)

## 2023-05-10 LAB — POCT URINALYSIS DIP (CLINITEK)
Bilirubin, UA: NEGATIVE
Blood, UA: NEGATIVE
Glucose, UA: NEGATIVE mg/dL
Ketones, POC UA: NEGATIVE mg/dL
Nitrite, UA: NEGATIVE
POC PROTEIN,UA: NEGATIVE
Spec Grav, UA: 1.02 (ref 1.010–1.025)
Urobilinogen, UA: 0.2 E.U./dL
pH, UA: 6.5 (ref 5.0–8.0)

## 2023-05-10 MED ORDER — NITROFURANTOIN MONOHYD MACRO 100 MG PO CAPS: 100.0000 mg | ORAL_CAPSULE | Freq: Two times a day (BID) | ORAL | 0 refills | Status: DC

## 2023-05-10 MED ORDER — ATORVASTATIN CALCIUM 40 MG PO TABS: 40.0000 mg | ORAL_TABLET | Freq: Every day | ORAL | 3 refills | Status: DC

## 2023-05-10 NOTE — Progress Notes (Signed)
UTI - will tx with Macrobid.  Call if not better in one week.    Meds ordered this encounter  Medications   nitrofurantoin, macrocrystal-monohydrate, (MACROBID) 100 MG capsule    Sig: Take 1 capsule (100 mg total) by mouth 2 (two) times daily.    Dispense:  10 capsule    Refill:  0

## 2023-05-10 NOTE — Progress Notes (Signed)
   Established Patient Office Visit  Subjective   Patient ID: Krista [name], female    DOB: 09-19-1951  Age: 72 y.o. MRN: [id number]  Chief Complaint  Patient presents with   Dysuria    HPI  Krista [name] complains of painful urination for 2 days. Patient reports no antibiotic use or no recent catheterization. Patient not taken Azo. Denies flank pain, pelvic pain, fever, chills or sweats.    ROS    Objective:     BP 128/80   Pulse 89   Temp 98 F (36.7 C) (Oral)    Physical Exam   No results found for any visits on 05/10/23.    The 10-year ASCVD risk score (Arnett DK, et al., 2019) is: 13.9%    Assessment & Plan:    Problem List Items Addressed This Visit   None Visit Diagnoses     Dysuria    -  Primary   Relevant Orders   POCT URINALYSIS DIP (CLINITEK)   Urine Culture       Return if symptoms worsen or fail to improve.    Esmond Harps, CMA

## 2023-05-13 LAB — URINE CULTURE
MICRO NUMBER:: 15049918
SPECIMEN QUALITY:: ADEQUATE

## 2023-05-15 NOTE — Progress Notes (Signed)
HI Krista [name] your urine culture was positive for e coli. MAke sure to complete your antibiotic. I hope you are feelijg better.

## 2023-05-16 DIAGNOSIS — H26491 Other secondary cataract, right eye: Secondary | ICD-10-CM | POA: Diagnosis not present

## 2023-05-22 DIAGNOSIS — K120 Recurrent oral aphthae: Secondary | ICD-10-CM | POA: Diagnosis not present

## 2023-05-23 DIAGNOSIS — D649 Anemia, unspecified: Secondary | ICD-10-CM | POA: Diagnosis not present

## 2023-05-23 DIAGNOSIS — J449 Chronic obstructive pulmonary disease, unspecified: Secondary | ICD-10-CM | POA: Diagnosis not present

## 2023-05-23 DIAGNOSIS — G510 Bell's palsy: Secondary | ICD-10-CM | POA: Diagnosis not present

## 2023-05-23 DIAGNOSIS — Z87891 Personal history of nicotine dependence: Secondary | ICD-10-CM | POA: Diagnosis not present

## 2023-05-23 DIAGNOSIS — K219 Gastro-esophageal reflux disease without esophagitis: Secondary | ICD-10-CM | POA: Diagnosis not present

## 2023-05-23 DIAGNOSIS — R258 Other abnormal involuntary movements: Secondary | ICD-10-CM | POA: Diagnosis not present

## 2023-05-23 DIAGNOSIS — K121 Other forms of stomatitis: Secondary | ICD-10-CM | POA: Diagnosis not present

## 2023-05-23 DIAGNOSIS — G5133 Clonic hemifacial spasm, bilateral: Secondary | ICD-10-CM | POA: Diagnosis not present

## 2023-05-23 DIAGNOSIS — M1990 Unspecified osteoarthritis, unspecified site: Secondary | ICD-10-CM | POA: Diagnosis not present

## 2023-05-23 DIAGNOSIS — K120 Recurrent oral aphthae: Secondary | ICD-10-CM | POA: Diagnosis not present

## 2023-05-23 DIAGNOSIS — M5136 Other intervertebral disc degeneration, lumbar region: Secondary | ICD-10-CM | POA: Diagnosis not present

## 2023-05-27 ENCOUNTER — Other Ambulatory Visit: Payer: Self-pay | Admitting: Medical-Surgical

## 2023-05-28 ENCOUNTER — Other Ambulatory Visit: Payer: Self-pay | Admitting: Medical-Surgical

## 2023-05-29 ENCOUNTER — Encounter: Payer: Self-pay | Admitting: *Deleted

## 2023-05-29 ENCOUNTER — Telehealth: Payer: Self-pay | Admitting: *Deleted

## 2023-05-29 NOTE — Transitions of Care (Post Inpatient/ED Visit) (Signed)
05/29/2023  Name: Krista [name] MRN: [id number] DOB: 11/13/1951  Today's TOC FU Call Status: Today's TOC FU Call Status:: Successful TOC FU Call Competed TOC FU Call Complete Date: 05/29/23  Transition Care Management Follow-up Telephone Call Date of Discharge: 05/26/23 Discharge Facility: Other ([facility]) Name of Other ([facility]) Discharge Facility: [facility] Type of Discharge: Inpatient Admission Primary Inpatient Discharge Diagnosis:: planned surgical facial reanimation for facial paralysis How have you been since you were released from the hospital?: Better ("I am doing fine, not having any problems.  Everything is okay, and I am pretty much back to my normal self) Any questions or concerns?: No  Items Reviewed: Did you receive and understand the discharge instructions provided?: Yes (briefly reviewed with patient who verbalizes good understanding of same- outside hospital AVS) Medications obtained,verified, and reconciled?: Yes (Medications Reviewed) (Full medication reconciliation/ review completed; no concerns or discrepancies identified; confirmed patient obtained/ is taking all newly Rx'd medications as instructed; self-manages medications and denies questions/ concerns around medications today) Any new allergies since your discharge?: No Dietary orders reviewed?: Yes Type of Diet Ordered:: Regular Do you have support at home?: Yes People in Home: spouse Name of Support/Comfort Primary Source: Reports independent in self-care activities; spouse assists as/ if needed/ indicated  Medications Reviewed Today: Medications Reviewed Today     Reviewed by Michaela Corner, RN (Registered Nurse) on 05/29/23 at 1201  Med List Status: <None>   Medication Order Taking? Sig Documenting Provider Last Dose Status Informant  atorvastatin (LIPITOR) 40 MG tablet [id number] Yes Take 1 tablet (40 mg total) by mouth daily. Christen Butter, NP Taking Active   cholecalciferol (VITAMIN D3) 25 MCG  (1000 UNIT) tablet [id number] Yes Take 1,000 Units by mouth every evening. [provider] Taking Active Self  colchicine 0.6 MG tablet [id number] Yes Take 0.6 mg by mouth 2 (two) times daily. Christen Butter, NP Taking Active Self           Med Note Marilu Favre May 29, 2023 11:55 AM) 05/29/23: Reports during TOC call was recently prescribed during dermatology appointment  cyclobenzaprine (FLEXERIL) 10 MG tablet [id number] Yes TAKE 1/2 TO 1 TABLET BY MOUTH 3 TIMES A DAY AS NEEDED MUSCLE SPASMS  Patient taking differently: Take 10 mg by mouth in the morning, at noon, and at bedtime.   Monica Becton, MD Taking Active Self  diphenhydramine-acetaminophen (TYLENOL PM) 25-500 MG TABS tablet [id number] Yes Take 2 tablets by mouth at bedtime. [provider] Taking Active Self  famotidine (PEPCID) 40 MG tablet [id number]  TAKE 1 TABLET EVERY DAY (APPOINTMENT IS NEEDED FOR REFILLS)  Patient taking differently: Take 40 mg by mouth at bedtime.   Jomarie Longs, PA-C  Active Self  gabapentin (NEURONTIN) 100 MG capsule [id number] Yes Take 100 mg by mouth at bedtime. Christen Butter, NP Taking Active Self           Med Note Marilu Favre May 29, 2023 11:59 AM) 05/29/23: reports during [facility] call is currently taking  LUTEIN ESTERS PO [id number]  Take 1 tablet by mouth at bedtime. [provider]  Active Self  magnesium oxide (MAG-OX) 400 (240 Mg) MG tablet [id number] Yes Take 400 mg by mouth in the morning. [provider] Taking Active Self  meloxicam (MOBIC) 15 MG tablet [id number] Yes Take 15 mg by mouth daily. Christen Butter, NP Taking Active Self           Med Note Berneta Levins, Su Hilt  M   Mon May 29, 2023 12:00 PM) 05/29/23: reports during [facility] call has been taking as prescribed  nitrofurantoin, macrocrystal-monohydrate, (MACROBID) 100 MG capsule [id number] No Take 1 capsule (100 mg total) by mouth 2 (two) times daily.  Patient not taking: Reported on 05/29/2023    Agapito Games, MD Not Taking Active            Med Note Marilu Favre May 29, 2023 11:57 AM) 05/29/23: Reports during TOC call that she has completed doses as prescribed- reports took as instructed  valsartan-hydrochlorothiazide (DIOVAN-HCT) 160-12.5 MG tablet [id number] Yes TAKE 1 TABLET BY MOUTH EVERY DAY Christen Butter, NP Taking Active             Home Care and Equipment/Supplies: Were Home Health Services Ordered?: No Any new equipment or medical supplies ordered?: No  Functional Questionnaire: Do you need assistance with bathing/showering or dressing?: No Do you need assistance with meal preparation?: No Do you need assistance with eating?: No Do you have difficulty maintaining continence: No Do you need assistance with getting out of bed/getting out of a chair/moving?: No Do you have difficulty managing or taking your medications?: No  Follow up appointments reviewed: PCP Follow-up appointment confirmed?: NA (verified not indicated per hospital discharging provider discharge notes) Specialist Hospital Follow-up appointment confirmed?: Yes Date of Specialist follow-up appointment?: 06/01/23 Follow-Up Specialty Provider:: surgical provider at [facility] Do you need transportation to your follow-up appointment?: No Do you understand care options if your condition(s) worsen?: Yes-patient verbalized understanding  SDOH Interventions Today    Flowsheet Row Most Recent Value  SDOH Interventions   Food Insecurity Interventions Intervention Not Indicated  Transportation Interventions Intervention Not Indicated  [drives self,  spouse assits as indicated/ needed]      TOC Interventions Today    Flowsheet Row Most Recent Value  TOC Interventions   TOC Interventions Discussed/Reviewed TOC Interventions Discussed  [Patient declines need for ongoing/ further care coordination outreach,  no care coordination needs identified at time of TOC call today,  offered to provid my  direct contact information should questions/ concerns/ needs arise post-TOC call- pt. declined]      Interventions Today    Flowsheet Row Most Recent Value  Chronic Disease   Chronic disease during today's visit Other  [planned surgery for facial paralysis]  General Interventions   General Interventions Discussed/Reviewed General Interventions Discussed, Doctor Visits, Durable Medical Equipment (DME)  Doctor Visits Discussed/Reviewed Specialist, Doctor Visits Discussed, PCP  Durable Medical Equipment (DME) Other  [confirmed does not currently require use of assistive devices]  PCP/Specialist Visits Compliance with follow-up visit  Education Interventions   Education Provided Provided Education  Provided Verbal Education On Other  [purpose of/ need to update DPR form and have uploaded to EHR]  Nutrition Interventions   Nutrition Discussed/Reviewed Nutrition Discussed  Pharmacy Interventions   Pharmacy Dicussed/Reviewed Pharmacy Topics Discussed  [Full medication review with updating medication list in EHR per patient report]      Caryl Pina, RN, BSN, CCRN Alumnus RN CM Care Coordination/ Transition of Care- [facility] Care Management [phone]: direct office

## 2023-06-14 DIAGNOSIS — H5213 Myopia, bilateral: Secondary | ICD-10-CM | POA: Diagnosis not present

## 2023-07-06 DIAGNOSIS — K120 Recurrent oral aphthae: Secondary | ICD-10-CM | POA: Diagnosis not present

## 2023-07-11 ENCOUNTER — Other Ambulatory Visit: Payer: Self-pay | Admitting: Medical-Surgical

## 2023-08-04 ENCOUNTER — Ambulatory Visit (INDEPENDENT_AMBULATORY_CARE_PROVIDER_SITE_OTHER): Payer: Medicare HMO

## 2023-08-04 DIAGNOSIS — Z122 Encounter for screening for malignant neoplasm of respiratory organs: Secondary | ICD-10-CM

## 2023-08-04 DIAGNOSIS — Z87891 Personal history of nicotine dependence: Secondary | ICD-10-CM

## 2023-08-15 ENCOUNTER — Other Ambulatory Visit: Payer: Self-pay

## 2023-08-15 DIAGNOSIS — Z122 Encounter for screening for malignant neoplasm of respiratory organs: Secondary | ICD-10-CM

## 2023-08-15 DIAGNOSIS — Z87891 Personal history of nicotine dependence: Secondary | ICD-10-CM

## 2023-09-02 DIAGNOSIS — H6692 Otitis media, unspecified, left ear: Secondary | ICD-10-CM | POA: Diagnosis not present

## 2023-09-02 DIAGNOSIS — J069 Acute upper respiratory infection, unspecified: Secondary | ICD-10-CM | POA: Diagnosis not present

## 2023-09-02 DIAGNOSIS — R5383 Other fatigue: Secondary | ICD-10-CM | POA: Diagnosis not present

## 2023-10-04 ENCOUNTER — Other Ambulatory Visit: Payer: Self-pay | Admitting: Medical-Surgical

## 2023-10-12 DIAGNOSIS — K120 Recurrent oral aphthae: Secondary | ICD-10-CM | POA: Diagnosis not present

## 2023-10-31 ENCOUNTER — Ambulatory Visit (INDEPENDENT_AMBULATORY_CARE_PROVIDER_SITE_OTHER): Payer: Medicare HMO | Admitting: Family Medicine

## 2023-10-31 ENCOUNTER — Ambulatory Visit: Payer: Medicare HMO

## 2023-10-31 VITALS — BP 123/72 | HR 81 | Ht 65.0 in | Wt 126.0 lb

## 2023-10-31 DIAGNOSIS — M47812 Spondylosis without myelopathy or radiculopathy, cervical region: Secondary | ICD-10-CM | POA: Diagnosis not present

## 2023-10-31 DIAGNOSIS — M5412 Radiculopathy, cervical region: Secondary | ICD-10-CM

## 2023-10-31 DIAGNOSIS — M50323 Other cervical disc degeneration at C6-C7 level: Secondary | ICD-10-CM | POA: Diagnosis not present

## 2023-10-31 DIAGNOSIS — M50322 Other cervical disc degeneration at C5-C6 level: Secondary | ICD-10-CM | POA: Diagnosis not present

## 2023-10-31 DIAGNOSIS — M5021 Other cervical disc displacement,  high cervical region: Secondary | ICD-10-CM | POA: Diagnosis not present

## 2023-10-31 HISTORY — DX: Radiculopathy, cervical region: M54.12

## 2023-10-31 MED ORDER — METHYLPREDNISOLONE 4 MG PO TBPK: ORAL_TABLET | ORAL | 0 refills | Status: DC

## 2023-10-31 NOTE — Addendum Note (Signed)
Addended by: Mammie Lorenzo on: 10/31/2023 02:16 PM   Modules accepted: Orders

## 2023-10-31 NOTE — Patient Instructions (Addendum)
Start medrol taper.  Have xrays completed Increase gabpentin to 2 tablets at bedtime.  You can also take one in the morning if needed.

## 2023-10-31 NOTE — Assessment & Plan Note (Signed)
She did not tolerate prednisone once in the past.  Will see if medrol dosepak is better tolerated.  Tramadol was not effective.  Intolerant to codeine containing products, so would prefer to avoid this.  Will have her increase her gabapentin to 2 tabs a bedtime and can add additional tab during the day if needed.  Xrays ordered and will proceed with MRI if not improving.

## 2023-10-31 NOTE — Progress Notes (Signed)
Krista [name] - 72 y.o. female MRN [id number]  Date of birth: 09-09-1951  Subjective Chief Complaint  Patient presents with   Arm Pain    HPI Krista [name] is a 72 y.o. female here today with complaint of R shoulder pain.  Pain starts in the neck with radiation to the R shoulder and down the arm.  She does have weakness that accompanies the pain as well as numbness and tingling.  She has tried several muscle rubs and OTC anti-inflammatories.  She has not had fever or chills.  No known injury or overuse.   ROS:  A comprehensive ROS was completed and negative except as noted per HPI  Allergies  Allergen Reactions   Codeine Other (See Comments)    Hyperactivity "wired" super hyperactivity      Colestipol Swelling    Lips swollen/NO resp issues   Kiwi Extract Hives   Other Hives, Rash and Other (See Comments)    Nectarines--tongue swells    Plum Pulp Hives   Tape Hives and Rash   Tomato Hives   Prednisone Other (See Comments)    Irritability      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    Depression    2021   Facial asymmetry, acquired 1974   left side paralysis   GERD (gastroesophageal reflux disease) 2021   H/O: Bell's palsy 1974   per pt during pregnancy , left side paralysis, told possible bell's palsy or stroke, after testing done had left mastoidectomy;  residual left side facialy asymmetry   History of basal cell carcinoma (BCC) excision    History of cerebral aneurysm 2010   s/p coiling  ;  per pt was an incidental finding on imaging   History of Clostridioides difficile infection 2010   treated   History of COVID-19  05/08/2021   positive result in care everywhere; per pt mild to moderate symptoms that resolved   History of Helicobacter pylori infection 2010   treated   Hyperlipidemia    Hypertension    followed by pcp   Lymphocytic colitis    followed by dr c. Yevonne Pax (GI);  dx by biopsy 09-13-2017   Macular degeneration of both eyes    Non-intractable vomiting with nausea    followed by dr Yevonne Pax   OA (osteoarthritis)    Osteoporosis    Pneumonia    PONV (postoperative nausea and vomiting)    Spondylosis, unspecified    cervical and lumbar   Wears hearing aid in both ears     Past Surgical History:  Procedure Laterality Date   ANTERIOR LAT LUMBAR FUSION N/A 12/13/2022   Procedure: PRONE TRANSPSOAS INTERBODY FUSION, LUMBAR THREE-FOUR;  Surgeon: Lisbeth Renshaw, MD;  Location: [facility] OR;  Service: Neurosurgery;  Laterality: N/A;  3C   CARPAL TUNNEL RELEASE Bilateral    early 2000s   CATARACT EXTRACTION W/ INTRAOCULAR LENS IMPLANT Bilateral 2014   CEREBRAL ANEURYSM REPAIR  2010   endocerebral coiling   COLONOSCOPY  09/13/2017   COSMETIC SURGERY     [facility] Surgery on nose   DISTAL INTERPHALANGEAL JOINT FUSION Left 09/16/2021   Procedure: left  small finger distal interphalangeal joint fusion;  Surgeon: Gomez Cleverly, MD;  Location: [facility];  Service: Orthopedics;  Laterality: Left;  with MAC anesthesia   ESOPHAGOGASTRODUODENOSCOPY  09/22/2020   EYE SURGERY  2020   Blepharoplasty   FACIAL RECONSTRUCTION SURGERY  03/28/2022   [facility]   FOOT SURGERY Bilateral    eary 2000s;  bilateral bunionectomy  and removal bone right 5th toe   FRACTURE SURGERY  1968   Shattered right ankle   MASTOIDECTOMY Left 1974   RADIOFREQUENCY ABLATION  09/01/2021   lumbar , left side   ROTATOR CUFF REPAIR Right 2014   SALPINGOOPHORECTOMY Bilateral 1995   w/ excision endometriosis via laparotomy   SPINE SURGERY  2021/2022   DDD, osteoarthritis   TONSILLECTOMY AND ADENOIDECTOMY  1970    TOTAL ABDOMINAL HYSTERECTOMY  1987   TRIGGER FINGER RELEASE Bilateral    early 2000s; one finger each hand   TUBAL LIGATION  1982   WRIST GANGLION EXCISION Left    2000s    Social History   Socioeconomic History   Marital status: Married    Spouse name: Randal   Number of children: 2   Years of education: 14   Highest education level: Associate degree: academic program  Occupational History   Occupation: retired    Comment: Control and instrumentation engineer  Tobacco Use   Smoking status: Former    Current packs/day: 0.00    Average packs/day: 0.8 packs/day for 42.0 years (31.5 ttl pk-yrs)    Types: Cigarettes    Start date: 10/06/1967    Quit date: 10/05/2009    Years since quitting: 14.0   Smokeless tobacco: Never   Tobacco comments:    Quit Date 10/01/2011  Vaping Use   Vaping status: Never Used  Substance and Sexual Activity   Alcohol use: Not Currently    Alcohol/week: 1.0 standard drink of alcohol    Types: 1 Glasses of wine per week    Comment: Rarely   Drug use: Never   Sexual activity: Not Currently    Birth control/protection: Abstinence, None  Other Topics Concern   Not on file  Social History Narrative   Lives with her husband. She enjoys swimming, reading, playing with her grandson and walking.   Social Determinants of Health   Financial Resource Strain: Low Risk  (10/31/2023)   Overall Financial Resource Strain (CARDIA)    Difficulty of Paying Living Expenses: Not hard at all  Food Insecurity: No Food Insecurity (10/31/2023)   Hunger Vital Sign    Worried About Running Out of Food in the Last Year: Never true    Ran Out of Food in the Last Year: Never true  Transportation Needs: No Transportation Needs (10/31/2023)   PRAPARE - Administrator, Civil Service (Medical): No    Lack of Transportation (Non-Medical): No  Physical Activity: Sufficiently Active (10/31/2023)   Exercise Vital Sign    Days of Exercise per Week: 5 days    Minutes of Exercise  per Session: 30 min  Stress: No Stress Concern Present (10/31/2023)   [employer] of Occupational Health - Occupational Stress Questionnaire    Feeling of Stress : Not at all  Social Connections: Socially Integrated (10/31/2023)   Social Connection and Isolation Panel [NHANES]    Frequency of Communication with Friends and Family: More than three times a week    Frequency of Social Gatherings with Friends and Family: Once a week    Attends Religious Services: More  than 4 times per year    Active Member of Clubs or Organizations: Yes    Attends Engineer, structural: More than 4 times per year    Marital Status: Married    Family History  Problem Relation Age of Onset   Heart failure Mother    Arthritis Mother    Hearing loss Mother    Heart disease Mother    Hypertension Mother    Stroke Mother    Varicose Veins Mother    Cancer Father    Alcohol abuse Father    Early death Father    Hearing loss Father    Cancer Brother    Alcohol abuse Brother    ADD / ADHD Son    Alcohol abuse Son    Alcohol abuse Sister    Arthritis Sister    Alcohol abuse Brother    Alcohol abuse Daughter    Arthritis Sister    Cancer Sister    Heart disease Sister    Arthritis Sister    Arthritis Brother    Hearing loss Brother     Health Maintenance  Topic Date Due   Medicare Annual Wellness (AWV)  03/21/2024   DTaP/Tdap/Td (2 - Td or Tdap) 06/20/2024   Lung Cancer Screening  08/03/2024   MAMMOGRAM  03/21/2025   DEXA SCAN  03/21/2025   Colonoscopy  09/14/2027   Pneumonia Vaccine 78+ Years old  Completed   INFLUENZA VACCINE  Completed   COVID-19 Vaccine  Completed   Hepatitis C Screening  Completed   Zoster Vaccines- Shingrix  Completed   HPV VACCINES  Aged Out      ----------------------------------------------------------------------------------------------------------------------------------------------------------------------------------------------------------------- Physical Exam BP 123/72 (BP Location: Left Arm, Patient Position: Sitting, Cuff Size: Small)   Pulse 81   Ht 5\' 5"  (1.651 m)   Wt 126 lb (57.2 kg)   SpO2 99%   BMI 20.97 kg/m   Physical Exam Constitutional:      Appearance: Normal appearance.  HENT:     Head: Normocephalic and atraumatic.  Cardiovascular:     Rate and Rhythm: Normal rate and regular rhythm.  Musculoskeletal:     Comments: TTP along R neck and upper trapezius.  Pain with abduction of the arm.  Weakness (4/5) on the R compared to (5/5) on the L.  Grip strength diminished.    Neurological:     Mental Status: She is alert.     ------------------------------------------------------------------------------------------------------------------------------------------------------------------------------------------------------------------- Assessment and Plan  Cervical radiculitis She did not tolerate prednisone once in the past.  Will see if medrol dosepak is better tolerated.  Tramadol was not effective.  Intolerant to codeine containing products, so would prefer to avoid this.  Will have her increase her gabapentin to 2 tabs a bedtime and can add additional tab during the day if needed.  Xrays ordered and will proceed with MRI if not improving.    Meds ordered this encounter  Medications   methylPREDNISolone (MEDROL DOSEPAK) 4 MG TBPK tablet    Sig: Taper as directed on packaging.    Dispense:  21 tablet    Refill:  0    No follow-ups on file.    This visit occurred during the SARS-CoV-2 public health emergency.  Safety protocols were in place, including screening questions prior to the visit, additional usage of staff PPE, and extensive cleaning of exam room while observing appropriate contact time  as indicated for disinfecting solutions.

## 2023-11-09 DIAGNOSIS — G5133 Clonic hemifacial spasm, bilateral: Secondary | ICD-10-CM | POA: Diagnosis not present

## 2023-11-09 DIAGNOSIS — G245 Blepharospasm: Secondary | ICD-10-CM | POA: Diagnosis not present

## 2023-11-09 DIAGNOSIS — G510 Bell's palsy: Secondary | ICD-10-CM | POA: Diagnosis not present

## 2023-11-10 ENCOUNTER — Encounter: Payer: Self-pay | Admitting: Family Medicine

## 2023-12-03 IMAGING — MG MM DIGITAL SCREENING BILAT W/ TOMO AND CAD
8 series · 9 of 24 positions shown · non-contrast
Comparison: Previous exam(s).

CLINICAL DATA: Screening.

EXAM:
DIGITAL SCREENING BILATERAL MAMMOGRAM WITH TOMOSYNTHESIS AND CAD
TECHNIQUE: Bilateral screening digital craniocaudal and mediolateral oblique
mammograms were obtained. Bilateral screening digital breast
tomosynthesis was performed. The images were evaluated with
computer-aided detection.

[R MLO synth-2D]
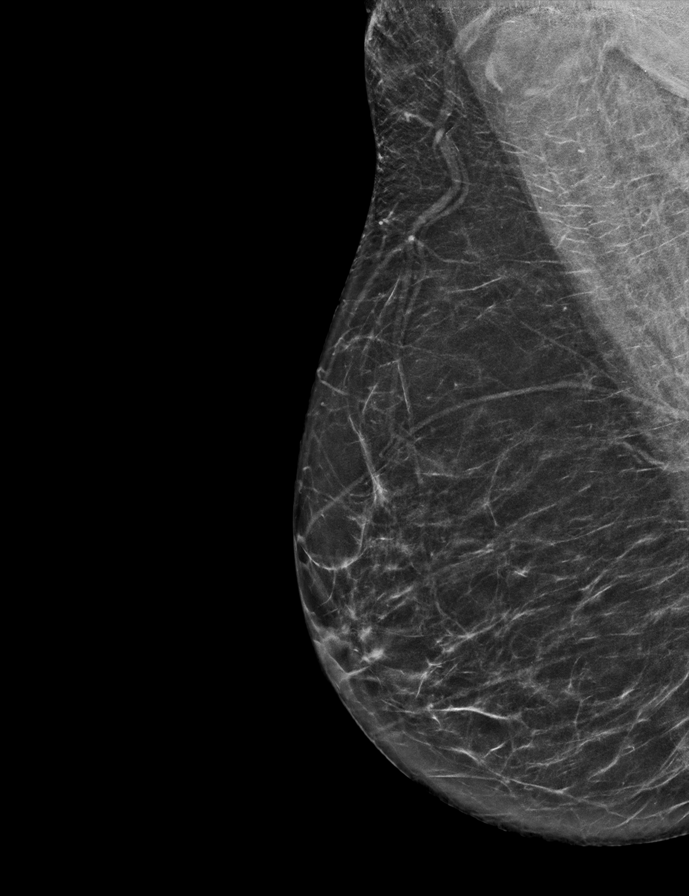

[R CC synth-2D]
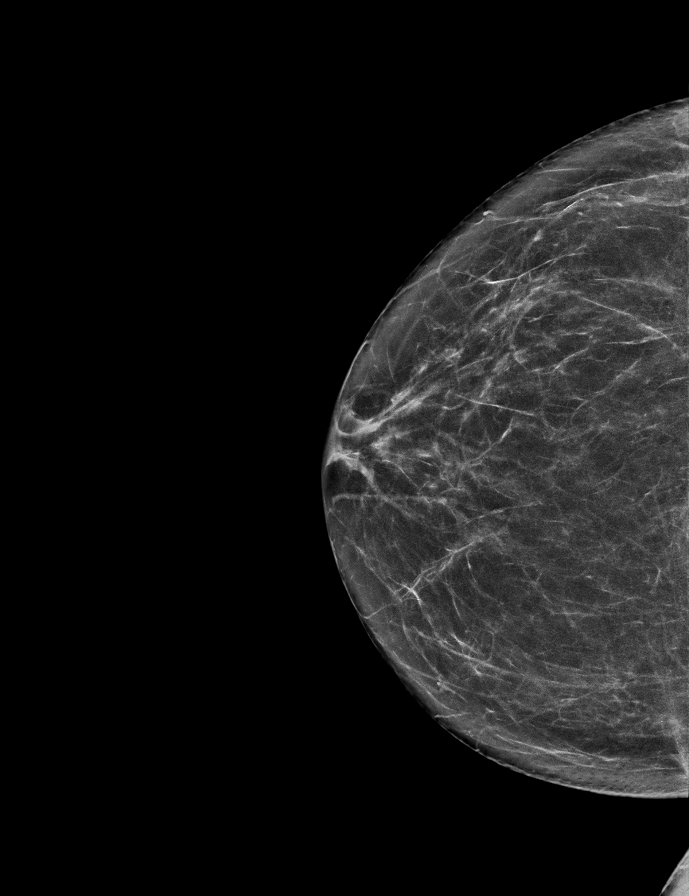

[L CC synth-2D]
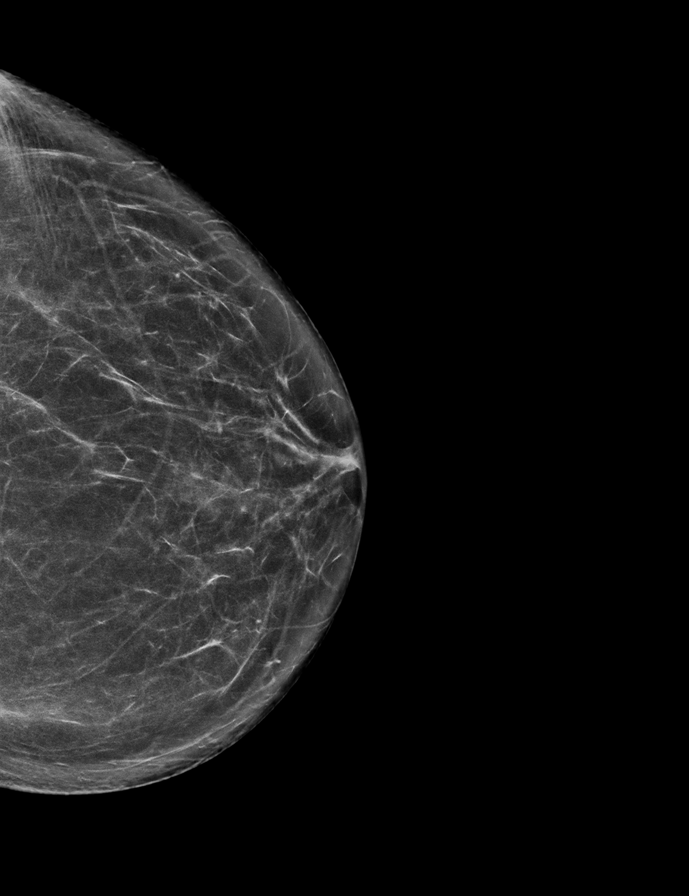

[L MLO synth-2D]
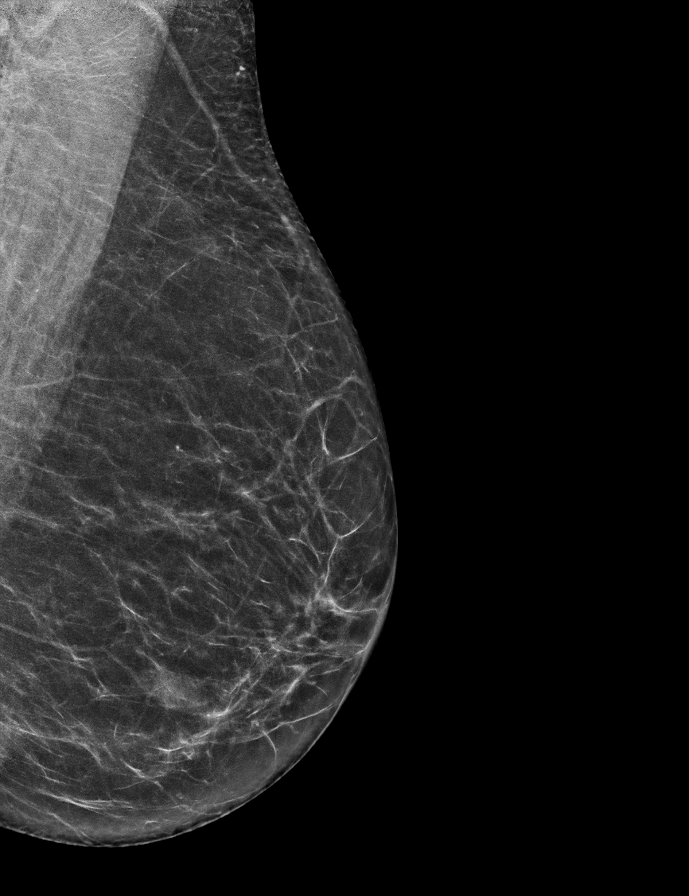

[L CC tomo · 2 of 66 frames shown]
[frame 22/66]
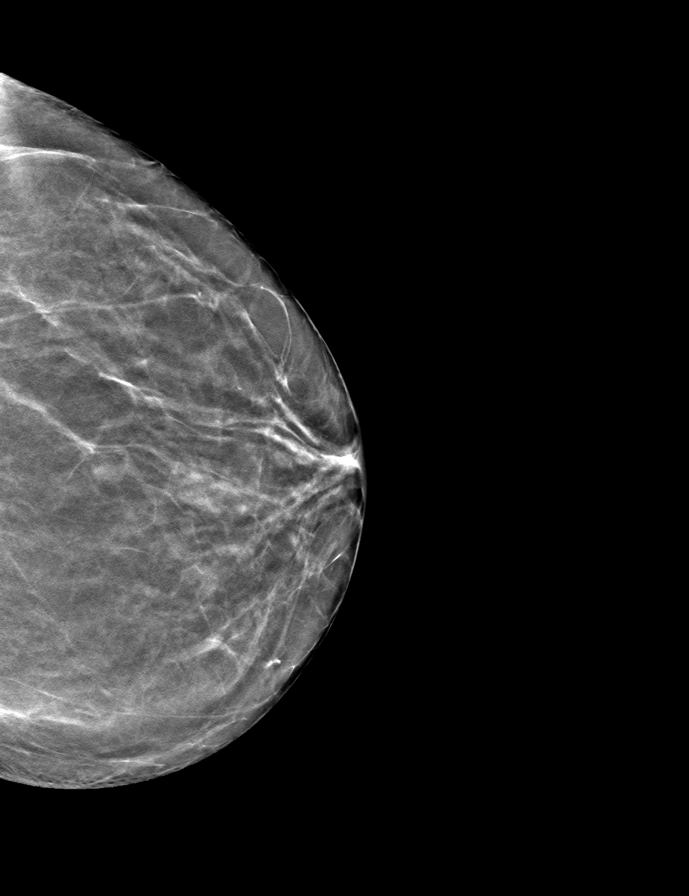
[frame 33/66]
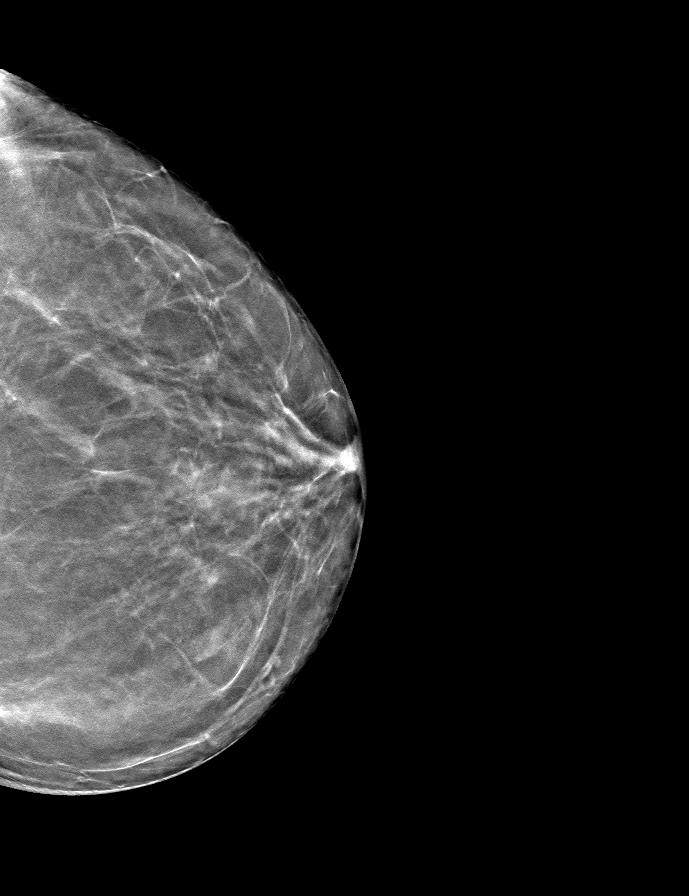

[L MLO tomo · tomo slice 31/62.0]
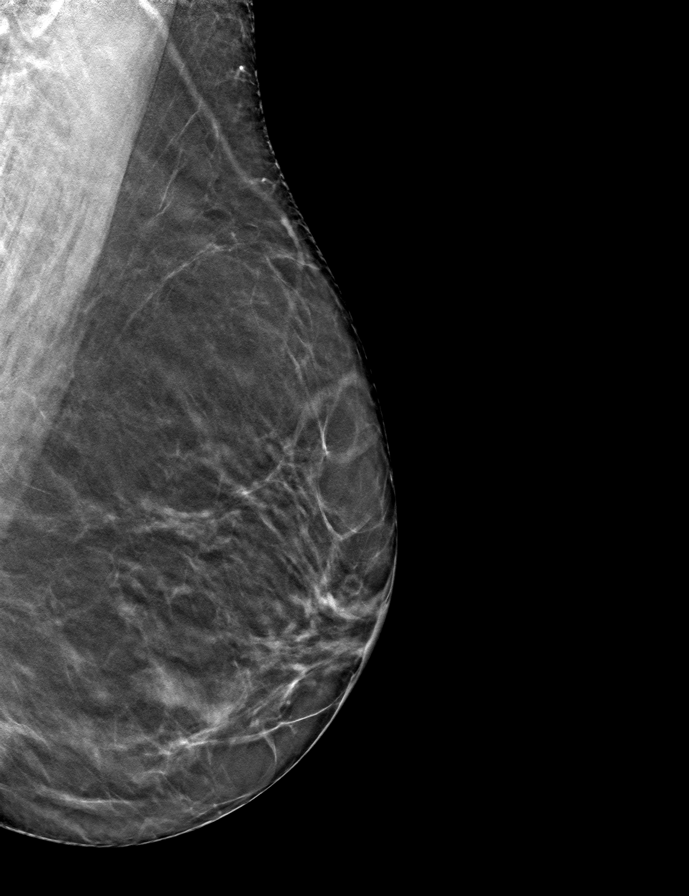

[R MLO tomo · tomo slice 32/63.0]
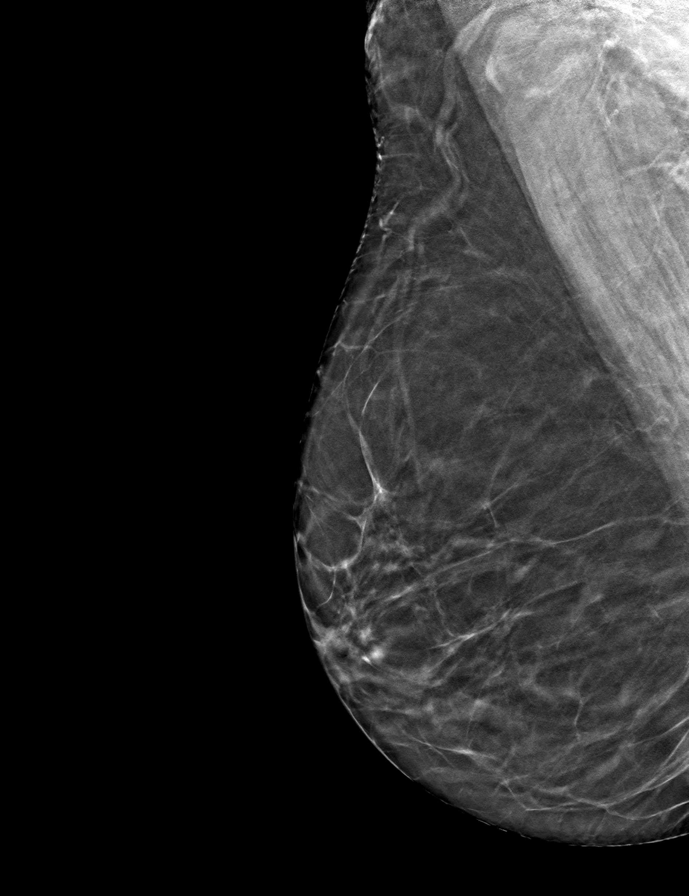

[R CC tomo · tomo slice 31/60.0]
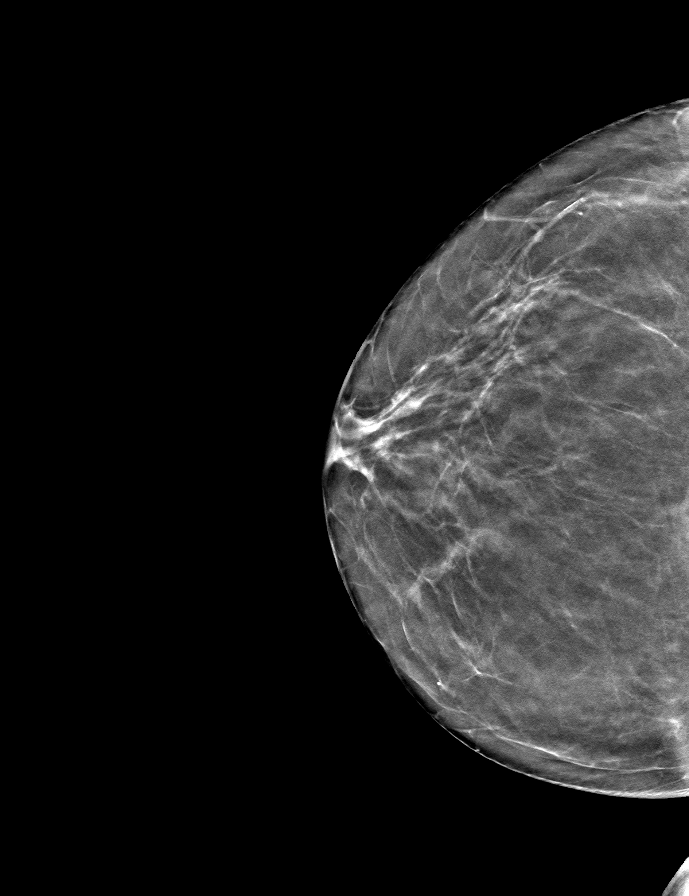

[9 of 24 positions shown; findings below may reference images not displayed]

ACR Breast Density Category b: There are scattered areas of
fibroglandular density.
FINDINGS: There are no findings suspicious for malignancy.
IMPRESSION: No mammographic evidence of malignancy. A result letter of this
screening mammogram will be mailed directly to the patient.

RECOMMENDATION:
Screening mammogram in one year. (Code:51-O-LD2)

BI-RADS CATEGORY  1: Negative.

## 2023-12-05 DIAGNOSIS — Z888 Allergy status to other drugs, medicaments and biological substances status: Secondary | ICD-10-CM | POA: Diagnosis not present

## 2023-12-05 DIAGNOSIS — R059 Cough, unspecified: Secondary | ICD-10-CM | POA: Diagnosis not present

## 2023-12-05 DIAGNOSIS — R42 Dizziness and giddiness: Secondary | ICD-10-CM | POA: Diagnosis not present

## 2023-12-05 DIAGNOSIS — I10 Essential (primary) hypertension: Secondary | ICD-10-CM | POA: Diagnosis not present

## 2023-12-05 DIAGNOSIS — J101 Influenza due to other identified influenza virus with other respiratory manifestations: Secondary | ICD-10-CM | POA: Diagnosis not present

## 2023-12-12 DIAGNOSIS — J440 Chronic obstructive pulmonary disease with acute lower respiratory infection: Secondary | ICD-10-CM | POA: Diagnosis not present

## 2023-12-12 DIAGNOSIS — Z87891 Personal history of nicotine dependence: Secondary | ICD-10-CM | POA: Diagnosis not present

## 2023-12-12 DIAGNOSIS — E7800 Pure hypercholesterolemia, unspecified: Secondary | ICD-10-CM | POA: Diagnosis not present

## 2023-12-12 DIAGNOSIS — J432 Centrilobular emphysema: Secondary | ICD-10-CM | POA: Diagnosis not present

## 2023-12-12 DIAGNOSIS — J209 Acute bronchitis, unspecified: Secondary | ICD-10-CM | POA: Diagnosis not present

## 2023-12-12 DIAGNOSIS — J069 Acute upper respiratory infection, unspecified: Secondary | ICD-10-CM | POA: Diagnosis not present

## 2023-12-12 DIAGNOSIS — J189 Pneumonia, unspecified organism: Secondary | ICD-10-CM | POA: Diagnosis not present

## 2023-12-12 DIAGNOSIS — I10 Essential (primary) hypertension: Secondary | ICD-10-CM | POA: Diagnosis not present

## 2023-12-12 DIAGNOSIS — Z888 Allergy status to other drugs, medicaments and biological substances status: Secondary | ICD-10-CM | POA: Diagnosis not present

## 2023-12-20 DIAGNOSIS — G510 Bell's palsy: Secondary | ICD-10-CM | POA: Diagnosis not present

## 2023-12-20 DIAGNOSIS — R471 Dysarthria and anarthria: Secondary | ICD-10-CM | POA: Diagnosis not present

## 2023-12-25 DIAGNOSIS — M67911 Unspecified disorder of synovium and tendon, right shoulder: Secondary | ICD-10-CM | POA: Diagnosis not present

## 2023-12-25 DIAGNOSIS — M5412 Radiculopathy, cervical region: Secondary | ICD-10-CM | POA: Diagnosis not present

## 2024-01-02 ENCOUNTER — Other Ambulatory Visit: Payer: Self-pay | Admitting: Medical-Surgical

## 2024-01-02 DIAGNOSIS — S46011A Strain of muscle(s) and tendon(s) of the rotator cuff of right shoulder, initial encounter: Secondary | ICD-10-CM | POA: Diagnosis not present

## 2024-01-02 DIAGNOSIS — S46012A Strain of muscle(s) and tendon(s) of the rotator cuff of left shoulder, initial encounter: Secondary | ICD-10-CM | POA: Diagnosis not present

## 2024-01-11 DIAGNOSIS — M25511 Pain in right shoulder: Secondary | ICD-10-CM | POA: Diagnosis not present

## 2024-01-12 ENCOUNTER — Other Ambulatory Visit: Payer: Self-pay | Admitting: Medical-Surgical

## 2024-01-19 DIAGNOSIS — M25511 Pain in right shoulder: Secondary | ICD-10-CM | POA: Diagnosis not present

## 2024-01-23 DIAGNOSIS — M50222 Other cervical disc displacement at C5-C6 level: Secondary | ICD-10-CM | POA: Diagnosis not present

## 2024-01-23 DIAGNOSIS — M4802 Spinal stenosis, cervical region: Secondary | ICD-10-CM | POA: Diagnosis not present

## 2024-01-23 DIAGNOSIS — M5412 Radiculopathy, cervical region: Secondary | ICD-10-CM | POA: Diagnosis not present

## 2024-01-23 DIAGNOSIS — M5021 Other cervical disc displacement,  high cervical region: Secondary | ICD-10-CM | POA: Diagnosis not present

## 2024-01-23 DIAGNOSIS — M50223 Other cervical disc displacement at C6-C7 level: Secondary | ICD-10-CM | POA: Diagnosis not present

## 2024-02-01 DIAGNOSIS — G510 Bell's palsy: Secondary | ICD-10-CM | POA: Diagnosis not present

## 2024-02-01 DIAGNOSIS — R471 Dysarthria and anarthria: Secondary | ICD-10-CM | POA: Diagnosis not present

## 2024-02-07 ENCOUNTER — Ambulatory Visit: Payer: Medicare HMO

## 2024-02-07 VITALS — Ht 65.0 in | Wt 116.0 lb

## 2024-02-07 DIAGNOSIS — G5133 Clonic hemifacial spasm, bilateral: Secondary | ICD-10-CM | POA: Diagnosis not present

## 2024-02-07 DIAGNOSIS — Z0000 Encounter for general adult medical examination without abnormal findings: Secondary | ICD-10-CM | POA: Diagnosis not present

## 2024-02-07 DIAGNOSIS — G245 Blepharospasm: Secondary | ICD-10-CM | POA: Diagnosis not present

## 2024-02-07 DIAGNOSIS — Z1231 Encounter for screening mammogram for malignant neoplasm of breast: Secondary | ICD-10-CM

## 2024-02-07 DIAGNOSIS — G510 Bell's palsy: Secondary | ICD-10-CM | POA: Diagnosis not present

## 2024-02-07 NOTE — Patient Instructions (Signed)
  Krista [name] , Thank you for taking time to come for your Medicare Wellness Visit. I appreciate your ongoing commitment to your health goals. Please review the following plan we discussed and let me know if I can assist you in the future.   These are the goals we discussed:  Goals       Activity and Exercise Increased      She has neck pain that decreases her activity. She would like to become more active after neck treatment.       Exercise 3x per week (30 min per time)      Going to join [employer] and will start doing some aerobics there.You are doing so good with your diet and exercise routine      Patient Stated      Would like to have back pain addressed so to that it is not so limiting and she can walk further/tolerate activities better.      Patient Stated (pt-stated)      Patient states she would like to be healthy enough to get off of some of her medications.      Patient Stated (pt-stated)      02/06/2023 AWV Goal: Exercise for General Health  Patient will verbalize understanding of the benefits of increased physical activity: Exercising regularly is important. It will improve your overall fitness, flexibility, and endurance. Regular exercise also will improve your overall health. It can help you control your weight, reduce stress, and improve your bone density. Over the next year, patient will increase physical activity as tolerated with a goal of at least 150 minutes of moderate physical activity per week.  You can tell that you are exercising at a moderate intensity if your heart starts beating faster and you start breathing faster but can still hold a conversation. Moderate-intensity exercise ideas include: Walking 1 mile (1.6 km) in about 15 minutes Biking Hiking Golfing Dancing Water aerobics Patient will verbalize understanding of everyday activities that increase physical activity by providing examples like the following: Yard work, such as: Location manager Gardening Washing windows or floors Patient will be able to explain general safety guidelines for exercising:  Before you start a new exercise program, talk with your health care provider. Do not exercise so much that you hurt yourself, feel dizzy, or get very short of breath. Wear comfortable clothes and wear shoes with good support. Drink plenty of water while you exercise to prevent dehydration or heat stroke. Work out until your breathing and your heartbeat get faster.         This is a list of the screening recommended for you and due dates:  Health Maintenance  Topic Date Due   DTaP/Tdap/Td vaccine (2 - Td or Tdap) 06/20/2024   Screening for Lung Cancer  08/03/2024   Medicare Annual Wellness Visit  02/06/2025   Mammogram  03/21/2025   DEXA scan (bone density measurement)  03/21/2025   Colon Cancer Screening  09/14/2027   Pneumonia Vaccine  Completed   Flu Shot  Completed   COVID-19 Vaccine  Completed   Hepatitis C Screening  Completed   Zoster (Shingles) Vaccine  Completed   HPV Vaccine  Aged Out

## 2024-02-07 NOTE — Progress Notes (Signed)
 Subjective:   Krista [name] is a 73 y.o. female who presents for Medicare Annual (Subsequent) preventive examination.  Visit Complete: Virtual I connected with  [name] on 02/07/24 by a audio enabled telemedicine application and verified that I am speaking with the correct person using two identifiers.  Patient Location: Home  Provider Location: Office/Clinic  I discussed the limitations of evaluation and management by telemedicine. The patient expressed understanding and agreed to proceed.  Vital Signs: Because this visit was a virtual/telehealth visit, some criteria may be missing or patient reported. Any vitals not documented were not able to be obtained and vitals that have been documented are patient reported.  Patient Medicare AWV questionnaire was completed by the patient on n/a; I have confirmed that all information answered by patient is correct and no changes since this date.  Cardiac Risk Factors include: advanced age (>52men, >58 women);smoking/ tobacco exposure;hypertension;dyslipidemia;family history of premature cardiovascular disease     Objective:    Today's Vitals   02/07/24 0953 02/07/24 0954  Weight: 116 lb (52.6 kg)   Height: 5\' 5"  (1.651 m)   PainSc:  8    Body mass index is 19.3 kg/m.     02/07/2024   10:05 AM 02/06/2023   10:32 AM 12/13/2022    6:43 AM 12/08/2022    3:07 PM 08/22/2022   11:08 AM 01/31/2022    9:00 AM 09/16/2021   10:19 AM  Advanced Directives  Does Patient Have a Medical Advance Directive? Yes Yes  Yes Yes Yes Yes  Type of Advance Directive Living will Living will  Healthcare Power of [place];Living will Living will;Healthcare Power of Attorney Living will;Healthcare Power of Attorney   Does patient want to make changes to medical advance directive? No - Patient declined No - Patient declined   No - Patient declined No - Patient declined No - Patient declined  Copy of Healthcare Power of Attorney in Chart?   No - copy requested No -  copy requested  No - copy requested     Current Medications (verified) Outpatient Encounter Medications as of 02/07/2024  Medication Sig   atorvastatin (LIPITOR) 40 MG tablet Take 1 tablet (40 mg total) by mouth daily.   cholecalciferol (VITAMIN D3) 25 MCG (1000 UNIT) tablet Take 1,000 Units by mouth every evening.   colchicine 0.6 MG tablet Take 0.6 mg by mouth 2 (two) times daily.   cyclobenzaprine (FLEXERIL) 10 MG tablet TAKE 1/2 TO 1 TABLET BY MOUTH 3 TIMES A DAY AS NEEDED MUSCLE SPASMS (Patient taking differently: Take 10 mg by mouth in the morning, at noon, and at bedtime.)   diphenhydramine-acetaminophen (TYLENOL PM) 25-500 MG TABS tablet Take 2 tablets by mouth at bedtime.   gabapentin (NEURONTIN) 100 MG capsule Take 100 mg by mouth at bedtime.   meloxicam (MOBIC) 15 MG tablet Take 15 mg by mouth daily.   valsartan-hydrochlorothiazide (DIOVAN-HCT) 160-12.5 MG tablet TAKE 1 TABLET BY MOUTH EVERY DAY   [DISCONTINUED] magnesium oxide (MAG-OX) 400 (240 Mg) MG tablet Take 400 mg by mouth in the morning.   [DISCONTINUED] methylPREDNISolone (MEDROL DOSEPAK) 4 MG TBPK tablet Taper as directed on packaging.   No facility-administered encounter medications on file as of 02/07/2024.    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    Depression    2021   Facial asymmetry, acquired 1974   left side paralysis   GERD (gastroesophageal reflux disease) 2021   H/O: Bell's palsy 1974   per pt during pregnancy , left side paralysis, told possible  bell's palsy or stroke, after testing done had left mastoidectomy;  residual left side facialy asymmetry   History of basal cell carcinoma (BCC) excision    History of cerebral aneurysm 2010   s/p coiling  ;  per pt was an incidental finding on imaging   History of Clostridioides difficile infection 2010   treated   History of COVID-19 05/08/2021   positive result in care everywhere; per pt mild to moderate symptoms that resolved   History of Helicobacter pylori infection 2010   treated   Hyperlipidemia    Hypertension    followed by pcp   Lymphocytic colitis    followed by dr c. Yevonne Pax (GI);  dx by biopsy 09-13-2017   Macular degeneration of both eyes    Non-intractable vomiting with nausea    followed by dr Yevonne Pax   OA (osteoarthritis)    Osteoporosis    Pneumonia    PONV (postoperative nausea and vomiting)    Spondylosis, unspecified    cervical and lumbar   Wears hearing aid in both ears    Past Surgical History:  Procedure Laterality Date   ANTERIOR LAT LUMBAR FUSION N/A 12/13/2022   Procedure: PRONE TRANSPSOAS INTERBODY FUSION, LUMBAR THREE-FOUR;  Surgeon: Lisbeth Renshaw, MD;  Location: [facility] OR;  Service: Neurosurgery;  Laterality: N/A;  3C   CARPAL TUNNEL RELEASE Bilateral    early 2000s   CATARACT EXTRACTION W/ INTRAOCULAR LENS IMPLANT Bilateral 2014   CEREBRAL ANEURYSM REPAIR  2010   endocerebral coiling   COLONOSCOPY  09/13/2017   COSMETIC SURGERY     [facility] Surgery on nose   DISTAL INTERPHALANGEAL JOINT FUSION Left 09/16/2021   Procedure: left small finger distal interphalangeal joint fusion;  Surgeon: Gomez Cleverly, MD;  Location: [facility];  Service: Orthopedics;  Laterality: Left;  with MAC anesthesia   ESOPHAGOGASTRODUODENOSCOPY  09/22/2020   EYE SURGERY  2020   Blepharoplasty   F[facility]   FOOT SURGERY Bilateral    eary 2000s;  bilateral bunionectomy  and removal bone right 5th toe    FRACTURE SURGERY  1968   Shattered right ankle   MASTOIDECTOMY Left 1974   RADIOFREQUENCY ABLATION  09/01/2021   lumbar , left side   ROTATOR CUFF REPAIR Right 2014   SALPINGOOPHORECTOMY Bilateral 1995   w/ excision endometriosis via laparotomy   SPINE SURGERY  2021/2022   DDD, osteoarthritis   TONSILLECTOMY AND ADENOIDECTOMY  1970   TOTAL ABDOMINAL HYSTERECTOMY  1987   TRIGGER FINGER RELEASE Bilateral    early 2000s; one finger each hand   TUBAL LIGATION  1982   WRIST GANGLION EXCISION Left    2000s   Family History  Problem Relation Age of Onset   Heart failure Mother    Arthritis Mother    Hearing loss Mother    Heart disease Mother    Hypertension Mother    Stroke Mother    Varicose Veins Mother    Cancer Father  Alcohol abuse Father    Early death Father    Hearing loss Father    Cancer Brother    Alcohol abuse Brother    ADD / ADHD Son    Alcohol abuse Son    Alcohol abuse Sister    Arthritis Sister    Alcohol abuse Brother    Alcohol abuse Daughter    Arthritis Sister    Cancer Sister    Heart disease Sister    Arthritis Sister    Arthritis Brother    Hearing loss Brother    Social History   Socioeconomic History   Marital status: Married    Spouse name: Randal   Number of children: 2   Years of education: 14   Highest education level: Associate degree: academic program  Occupational History   Occupation: retired    Comment: Control and instrumentation engineer  Tobacco Use   Smoking status: Former    Current packs/day: 0.00    Average packs/day: 0.8 packs/day for 42.0 years (31.5 ttl pk-yrs)    Types: Cigarettes    Start date: 10/06/1967    Quit date: 10/05/2009    Years since quitting: 14.3   Smokeless tobacco: Never   Tobacco comments:    Quit Date 10/01/2011  Vaping Use   Vaping status: Never Used  Substance and Sexual Activity   Alcohol use: Not Currently    Alcohol/week: 1.0 standard drink of alcohol    Types: 1 Glasses of wine per week     Comment: Rarely   Drug use: Never   Sexual activity: Not Currently    Birth control/protection: Abstinence, None  Other Topics Concern   Not on file  Social History Narrative   Lives with her husband. She enjoys playing with her grandson.   Social Drivers of Corporate investment banker Strain: Low Risk  (02/07/2024)   Overall Financial Resource Strain (CARDIA)    Difficulty of Paying Living Expenses: Not hard at all  Food Insecurity: No Food Insecurity (02/07/2024)   Hunger Vital Sign    Worried About Running Out of Food in the Last Year: Never true    Ran Out of Food in the Last Year: Never true  Transportation Needs: No Transportation Needs (02/07/2024)   PRAPARE - Administrator, Civil Service (Medical): No    Lack of Transportation (Non-Medical): No  Physical Activity: Insufficiently Active (02/07/2024)   Exercise Vital Sign    Days of Exercise per Week: 7 days    Minutes of Exercise per Session: 20 min  Stress: No Stress Concern Present (02/07/2024)   [employer] of Occupational Health - Occupational Stress Questionnaire    Feeling of Stress : Not at all  Social Connections: Socially Integrated (02/07/2024)   Social Connection and Isolation Panel [NHANES]    Frequency of Communication with Friends and Family: More than three times a week    Frequency of Social Gatherings with Friends and Family: Three times a week    Attends Religious Services: More than 4 times per year    Active Member of Clubs or Organizations: Yes    Attends Banker Meetings: More than 4 times per year    Marital Status: Married    Tobacco Counseling Counseling given: Not Answered Tobacco comments: Quit Date 10/01/2011   Clinical Intake:  Pre-visit preparation completed: Yes  Pain : 0-10 Pain Score: 8  Pain Type: Chronic pain Pain Location: Neck Pain Radiating Towards: right shoulder, elbow and hand Pain Descriptors / Indicators:  Aching Pain Onset: More than a month  ago Pain Frequency: Constant Pain Relieving Factors: nothing Effect of Pain on Daily Activities: She has more trouble vaccuming.  Pain Relieving Factors: nothing  BMI - recorded: 19.3 Nutritional Status: BMI of 19-24  Normal Nutritional Risks: None Diabetes: No  How often do you need to have someone help you when you read instructions, pamphlets, or other written materials from your doctor or pharmacy?: 1 - Never What is the last grade level you completed in school?: 14  Interpreter Needed?: No      Activities of Daily Living    02/07/2024    9:57 AM 01/31/2024    9:23 AM  In your present state of health, do you have any difficulty performing the following activities:  Hearing? 0 0  Vision? 0 0  Difficulty concentrating or making decisions? 0 0  Walking or climbing stairs? 0 0  Dressing or bathing? 0 0  Doing errands, shopping? 0 0  Preparing Food and eating ? N N  Using the Toilet? N N  In the past six months, have you accidently leaked urine? N N  Do you have problems with loss of bowel control? Y Y  Managing your Medications? N N  Managing your Finances? N N  Housekeeping or managing your Housekeeping? Y Y  Comment due to neck pain     Patient Care Team: Christen Butter, NP as PCP - General (Nurse Practitioner) Gabriel Carina, [facility] (Inactive) as Pharmacist (Pharmacist)  Indicate any recent Medical Services you may have received from other than [facility] providers in the past year (date may be approximate).     Assessment:   This is a routine wellness examination for [facility].  Hearing/Vision screen No results found.   Goals Addressed             This Visit's Progress    Activity and Exercise Increased       She has neck pain that decreases her activity. She would like to become more active after neck treatment.       Depression Screen    02/07/2024   10:03 AM 02/28/2023    3:11 PM 02/06/2023   10:33 AM 01/31/2022    9:01 AM 12/31/2021    1:09 PM 07/27/2021    10:35 AM 06/09/2021    9:38 AM  PHQ 2/9 Scores  PHQ - 2 Score 0 0 0 3 1 1 3   PHQ- 9 Score    12  10     Fall Risk    02/07/2024   10:05 AM 01/31/2024    9:23 AM 02/28/2023    3:10 PM 02/06/2023   10:33 AM 02/02/2023    9:30 AM  Fall Risk   Falls in the past year? 1 1 0 0 0  Number falls in past yr: 1 0 0 0 0  Injury with Fall? 1 0 0 0 0  Risk for fall due to : History of fall(s)  No Fall Risks No Fall Risks   Follow up Falls evaluation completed  Falls evaluation completed Falls evaluation completed     MEDICARE RISK AT HOME: Medicare Risk at Home Any stairs in or around the home?: Yes If so, are there any without handrails?: Yes Home free of loose throw rugs in walkways, pet beds, electrical cords, etc?: Yes Adequate lighting in your home to reduce risk of falls?: Yes Life alert?: No Use of a cane, walker or w/c?: No Grab bars in the bathroom?: Yes  Shower chair or bench in shower?: Yes Elevated toilet seat or a handicapped toilet?: No  TIMED UP AND GO:  Was the test performed?  No    Cognitive Function:        02/07/2024   10:06 AM 02/06/2023   10:38 AM 01/31/2022    9:12 AM 09/11/2020    1:13 PM 09/03/2019    1:13 PM  6CIT Screen  What Year? 0 points 0 points 0 points 0 points 0 points  What month? 0 points 0 points 0 points 0 points 0 points  What time? 0 points 0 points 0 points 0 points 0 points  Count back from 20 0 points 0 points 0 points 0 points 0 points  Months in reverse 0 points 0 points 0 points 0 points 0 points  Repeat phrase 0 points 0 points 0 points 0 points 2 points  Total Score 0 points 0 points 0 points 0 points 2 points    Immunizations Immunization History  Administered Date(s) Administered   Fluad Quad(high Dose 65+) 08/23/2019, 09/09/2022   Fluzone Influenza virus vaccine,trivalent (IIV3), split virus 08/17/2023   Influenza, High Dose Seasonal PF 08/15/2017, 08/24/2018, 09/04/2018   Influenza-Unspecified 08/23/2019, 08/01/2020, 09/08/2021    PFIZER Comirnaty(Gray Top)Covid-19 Tri-Sucrose Vaccine 06/14/2021   PFIZER(Purple Top)SARS-COV-2 Vaccination 01/16/2020, 02/11/2020, 08/01/2020, 09/07/2020, 12/01/2020   Pfizer Covid-19 Vaccine Bivalent Booster 31yrs & up 09/08/2021   Pfizer(Comirnaty)Fall Seasonal Vaccine 12 years and older 02/28/2023, 08/17/2023   Pneumococcal Conjugate-13 09/11/2020   Pneumococcal Polysaccharide-23 09/03/2019   Tdap 06/20/2014   Zoster Recombinant(Shingrix) 12/01/2020, 02/02/2021    TDAP status: Up to date  Flu Vaccine status: Up to date  Pneumococcal vaccine status: Up to date  Covid-19 vaccine status: Completed vaccines  Qualifies for Shingles Vaccine? Yes   Zostavax completed No   Shingrix Completed?: Yes  Screening Tests Health Maintenance  Topic Date Due   DTaP/Tdap/Td (2 - Td or Tdap) 06/20/2024   Lung Cancer Screening  08/03/2024   Medicare Annual Wellness (AWV)  02/06/2025   MAMMOGRAM  03/21/2025   DEXA SCAN  03/21/2025   Colonoscopy  09/14/2027   Pneumonia Vaccine 71+ Years old  Completed   INFLUENZA VACCINE  Completed   COVID-19 Vaccine  Completed   Hepatitis C Screening  Completed   Zoster Vaccines- Shingrix  Completed   HPV VACCINES  Aged Out    Health Maintenance  There are no preventive care reminders to display for this patient.   Colorectal cancer screening: Type of screening: Colonoscopy. Completed 09/13/2017. Repeat every 10 years  Mammogram status: Completed 03/22/23. Repeat every year  Bone Density status: Completed 03/22/2023. Results reflect: Bone density results: OSTEOPOROSIS. Repeat every 2 years.  Lung Cancer Screening: (Low Dose CT Chest recommended if Age 19-80 years, 20 pack-year currently smoking OR have quit w/in 15years.) does not qualify.   Lung Cancer Screening Referral: Patient already in program.   Additional Screening:  Hepatitis C Screening: does qualify; Completed 09/03/2019  Vision Screening: Recommended annual ophthalmology exams for  early detection of glaucoma and other disorders of the eye. Is the patient up to date with their annual eye exam?  Yes  Who is the provider or what is the name of the office in which the patient attends annual eye exams? [email or web address] If pt is not established with a provider, would they like to be referred to a provider to establish care?  N/a .   Dental Screening: Recommended annual dental exams for proper oral hygiene   [employer]  Referral / Chronic Care Management: CRR required this visit?  No   CCM required this visit?  No     Plan:     I have personally reviewed and noted the following in the patient's chart:   Medical and social history Use of alcohol, tobacco or illicit drugs  Current medications and supplements including opioid prescriptions. Patient is not currently taking opioid prescriptions. Functional ability and status Nutritional status Physical activity Advanced directives List of other physicians Hospitalizations, surgeries, and ER visits in previous 12 months Vitals Screenings to include cognitive, depression, and falls Referrals and appointments  In addition, I have reviewed and discussed with patient certain preventive protocols, quality metrics, and best practice recommendations. A written personalized care plan for preventive services as well as general preventive health recommendations were provided to patient.     Esmond Harps, CMA   02/07/2024   After Visit Summary: (MyChart) Due to this being a telephonic visit, the after visit summary with patients personalized plan was offered to patient via MyChart   Nurse Notes:    Krista [name] is a 73 y.o. female patient of Christen Butter, NP who had a Medicare Annual Wellness Visit today via telephone. Ivannah is Retired and lives with their spouse. She has 2 children. She reports that she is socially active and does interact with friends/family regularly. She is minimally physically active and  enjoys reading and spending time with her grandson.  Ordered Mammogram.

## 2024-02-21 DIAGNOSIS — M5412 Radiculopathy, cervical region: Secondary | ICD-10-CM | POA: Diagnosis not present

## 2024-02-22 ENCOUNTER — Other Ambulatory Visit: Payer: Self-pay | Admitting: Medical-Surgical

## 2024-03-07 DIAGNOSIS — M67919 Unspecified disorder of synovium and tendon, unspecified shoulder: Secondary | ICD-10-CM | POA: Insufficient documentation

## 2024-03-07 DIAGNOSIS — G510 Bell's palsy: Secondary | ICD-10-CM | POA: Diagnosis not present

## 2024-03-07 DIAGNOSIS — G5133 Clonic hemifacial spasm, bilateral: Secondary | ICD-10-CM | POA: Diagnosis not present

## 2024-03-07 DIAGNOSIS — G245 Blepharospasm: Secondary | ICD-10-CM | POA: Diagnosis not present

## 2024-03-07 HISTORY — DX: Unspecified disorder of synovium and tendon, unspecified shoulder: M67.919

## 2024-03-11 DIAGNOSIS — M5412 Radiculopathy, cervical region: Secondary | ICD-10-CM | POA: Diagnosis not present

## 2024-03-27 ENCOUNTER — Ambulatory Visit

## 2024-03-27 DIAGNOSIS — Z1231 Encounter for screening mammogram for malignant neoplasm of breast: Secondary | ICD-10-CM | POA: Diagnosis not present

## 2024-03-29 ENCOUNTER — Encounter: Payer: Self-pay | Admitting: Medical-Surgical

## 2024-04-01 ENCOUNTER — Encounter: Payer: Self-pay | Admitting: Medical-Surgical

## 2024-04-01 ENCOUNTER — Ambulatory Visit (INDEPENDENT_AMBULATORY_CARE_PROVIDER_SITE_OTHER): Payer: Medicare HMO | Admitting: Medical-Surgical

## 2024-04-01 VITALS — BP 129/72 | HR 88 | Resp 20 | Ht 65.0 in | Wt 118.0 lb

## 2024-04-01 DIAGNOSIS — E782 Mixed hyperlipidemia: Secondary | ICD-10-CM

## 2024-04-01 DIAGNOSIS — I10 Essential (primary) hypertension: Secondary | ICD-10-CM

## 2024-04-01 DIAGNOSIS — R079 Chest pain, unspecified: Secondary | ICD-10-CM | POA: Diagnosis not present

## 2024-04-01 DIAGNOSIS — R252 Cramp and spasm: Secondary | ICD-10-CM | POA: Diagnosis not present

## 2024-04-01 DIAGNOSIS — Z0000 Encounter for general adult medical examination without abnormal findings: Secondary | ICD-10-CM | POA: Diagnosis not present

## 2024-04-01 DIAGNOSIS — I2510 Atherosclerotic heart disease of native coronary artery without angina pectoris: Secondary | ICD-10-CM

## 2024-04-01 MED ORDER — VALSARTAN-HYDROCHLOROTHIAZIDE 160-12.5 MG PO TABS: 1.0000 | ORAL_TABLET | Freq: Every day | ORAL | 3 refills | Status: DC

## 2024-04-01 MED ORDER — ATORVASTATIN CALCIUM 40 MG PO TABS
40.0000 mg | ORAL_TABLET | Freq: Every day | ORAL | 3 refills | Status: AC
Start: 2024-04-01 — End: ?

## 2024-04-01 NOTE — Progress Notes (Signed)
 Complete physical exam  Patient: Krista [name]   DOB: March 15, 1951   73 y.o. Female  MRN: [id number]  Subjective:    Chief Complaint  Patient presents with   Annual Exam   Krista [name] is a 73 y.o. female who presents today for a complete physical exam. She reports consuming a general diet.  Walking for exercise.   She generally feels well. She reports sleeping poorly. She does not have additional problems to discuss today.   Most recent fall risk assessment:    02/07/2024   10:05 AM  Fall Risk   Falls in the past year? 1  Number falls in past yr: 1  Injury with Fall? 1  Risk for fall due to : History of fall(s)  Follow up Falls evaluation completed     Most recent depression screenings:    02/07/2024   10:03 AM 02/28/2023    3:11 PM  PHQ 2/9 Scores  PHQ - 2 Score 0 0    Vision:Within last year and Dental: No current dental problems and Receives regular dental care    Patient Care Team: Cherre Cornish, NP as PCP - General (Nurse Practitioner) Noelia Batman, [facility] (Inactive) as Pharmacist (Pharmacist)   Outpatient Medications Prior to Visit  Medication Sig   celecoxib (CELEBREX) 100 MG capsule Take 100 mg by mouth daily.   cholecalciferol (VITAMIN D3) 25 MCG (1000 UNIT) tablet Take 1,000 Units by mouth every evening.   colchicine 0.6 MG tablet Take 0.6 mg by mouth 2 (two) times daily.   cyclobenzaprine  (FLEXERIL ) 10 MG tablet TAKE 1/2 TO 1 TABLET BY MOUTH 3 TIMES A DAY AS NEEDED MUSCLE SPASMS (Patient taking differently: Take 10 mg by mouth in the morning, at noon, and at bedtime.)   diphenhydramine -acetaminophen  (TYLENOL  PM) 25-500 MG TABS tablet Take 2 tablets by mouth at bedtime.   gabapentin  (NEURONTIN ) 100 MG capsule Take 100 mg by mouth at bedtime.   meloxicam  (MOBIC ) 15 MG tablet Take 15 mg by mouth daily.   [DISCONTINUED] atorvastatin  (LIPITOR) 40 MG tablet Take 1 tablet (40 mg total) by mouth daily.   [DISCONTINUED] valsartan -hydrochlorothiazide  (DIOVAN -HCT)  160-12.5 MG tablet TAKE 1 TABLET BY MOUTH EVERY DAY   No facility-administered medications prior to visit.    Review of Systems  Constitutional:  Negative for chills, fever, malaise/fatigue and weight loss.  HENT:  Negative for congestion, ear pain, hearing loss, sinus pain and sore throat.   Eyes:  Negative for blurred vision, photophobia and pain.  Respiratory:  Negative for cough, shortness of breath and wheezing.   Cardiovascular:  Positive for chest pain. Negative for palpitations and leg swelling.  Gastrointestinal:  Positive for diarrhea. Negative for abdominal pain, constipation, heartburn, nausea and vomiting.  Genitourinary:  Negative for dysuria, frequency and urgency.  Musculoskeletal:  Positive for joint pain and myalgias. Negative for falls and neck pain.  Skin:  Negative for itching and rash.  Neurological:  Negative for dizziness, weakness and headaches.  Endo/Heme/Allergies:  Positive for environmental allergies. Negative for polydipsia. Does not bruise/bleed easily.  Psychiatric/Behavioral:  Positive for depression. Negative for substance abuse and suicidal ideas. The patient has insomnia. The patient is not nervous/anxious.      Objective:     BP 129/72 (BP Location: Left Arm, Cuff Size: Small)   Pulse 88   Resp 20   Ht 5\' 5"  (1.651 m)   Wt 118 lb (53.5 kg)   SpO2 99%   BMI 19.64 kg/m    Physical Exam  Vitals reviewed.  Constitutional:      General: She is not in acute distress.    Appearance: Normal appearance. She is normal weight. She is not ill-appearing.  HENT:     Head: Normocephalic and atraumatic.     Right Ear: Tympanic membrane, ear canal and external ear normal. There is no impacted cerumen.     Left Ear: Tympanic membrane, ear canal and external ear normal. There is no impacted cerumen.     Nose: Nose normal. No congestion or rhinorrhea.     Mouth/Throat:     Mouth: Mucous membranes are moist.     Pharynx: No oropharyngeal exudate or posterior  oropharyngeal erythema.  Eyes:     General: No scleral icterus.       Right eye: No discharge.        Left eye: No discharge.     Extraocular Movements: Extraocular movements intact.     Conjunctiva/sclera: Conjunctivae normal.     Pupils: Pupils are equal, round, and reactive to light.  Neck:     Thyroid : No thyromegaly.     Vascular: No carotid bruit or JVD.     Trachea: Trachea normal.  Cardiovascular:     Rate and Rhythm: Normal rate and regular rhythm.     Pulses: Normal pulses.     Heart sounds: Normal heart sounds. No murmur heard.    No friction rub. No gallop.  Pulmonary:     Effort: Pulmonary effort is normal. No respiratory distress.     Breath sounds: Normal breath sounds. No wheezing.  Abdominal:     General: Bowel sounds are normal. There is no distension.     Palpations: Abdomen is soft.     Tenderness: There is no abdominal tenderness. There is no guarding.  Musculoskeletal:        General: Normal range of motion.     Cervical back: Normal range of motion and neck supple.  Lymphadenopathy:     Cervical: No cervical adenopathy.  Skin:    General: Skin is warm and dry.  Neurological:     Mental Status: She is alert and oriented to person, place, and time.     Cranial Nerves: Facial asymmetry (chronic) present. No cranial nerve deficit.  Psychiatric:        Mood and Affect: Mood normal.        Behavior: Behavior normal.        Thought Content: Thought content normal.        Judgment: Judgment normal.   No results found for any visits on 04/01/24.     Assessment & Plan:    Routine Health Maintenance and Physical Exam  Immunization History  Administered Date(s) Administered   Fluad Quad(high Dose 65+) 08/23/2019, 09/09/2022   Fluzone Influenza virus vaccine,trivalent (IIV3), split virus 08/17/2023   Influenza, High Dose Seasonal PF 08/15/2017, 08/24/2018, 09/04/2018   Influenza-Unspecified 08/23/2019, 08/01/2020, 09/08/2021   PFIZER Comirnaty(Gray  Top)Covid-19 Tri-Sucrose Vaccine 06/14/2021   PFIZER(Purple Top)SARS-COV-2 Vaccination 01/16/2020, 02/11/2020, 08/01/2020, 09/07/2020, 12/01/2020   Pfizer Covid-19 Vaccine Bivalent Booster 36yrs & up 09/08/2021   Pfizer(Comirnaty)Fall Seasonal Vaccine 12 years and older 02/28/2023, 08/17/2023   Pneumococcal Conjugate-13 09/11/2020   Pneumococcal Polysaccharide-23 09/03/2019   Tdap 06/20/2014   Zoster Recombinant(Shingrix) 12/01/2020, 02/02/2021    Health Maintenance  Topic Date Due   COVID-19 Vaccine (10 - 2024-25 season) 04/17/2024 (Originally 02/14/2024)   DTaP/Tdap/Td (2 - Td or Tdap) 06/20/2024   INFLUENZA VACCINE  07/05/2024   Lung Cancer Screening  08/03/2024   Medicare Annual Wellness (AWV)  02/06/2025   DEXA SCAN  03/21/2025   MAMMOGRAM  03/27/2026   Colonoscopy  09/14/2027   Pneumonia Vaccine 35+ Years old  Completed   Hepatitis C Screening  Completed   Zoster Vaccines- Shingrix  Completed   HPV VACCINES  Aged Out   Meningococcal B Vaccine  Aged Out    Discussed health benefits of physical activity, and encouraged her to engage in regular exercise appropriate for her age and condition.  1. Annual physical exam (Primary) Checking labs as below. UTD on preventative care. Wellness information provided with AVS. - CBC with Differential/Platelet - CMP14+EGFR  2. Essential hypertension Blood pressure at goal.  Continue valsartan -HCTZ as prescribed.  Checking labs as below. - valsartan -hydrochlorothiazide  (DIOVAN -HCT) 160-12.5 MG tablet; Take 1 tablet by mouth daily.  Dispense: 90 tablet; Refill: 3 - CBC with Differential/Platelet - CMP14+EGFR - Lipid panel  3. Mixed hyperlipidemia Checking labs.  Continue atorvastatin . - atorvastatin  (LIPITOR) 40 MG tablet; Take 1 tablet (40 mg total) by mouth daily.  Dispense: 90 tablet; Refill: 3 - CMP14+EGFR - Lipid panel  4. Leg cramps Intermittent bilateral leg cramps at night.  Checking labs as below.  Discussed possible  addition of magnesium  oxide 400 mg nightly to help with leg cramps and sleep. - TSH - Iron, TIBC and Ferritin Panel - Magnesium  - VITAMIN D  25 Hydroxy (Vit-D Deficiency, Fractures)  5. Chest pain, unspecified type 6. Coronary artery calcification In the recent weeks, has had left chest pressure/pain that is reported as intermittent and only last about a minute.  Not accompanied with other symptoms.  Not associated with any particular tasks or positions.  On imaging last year, was seen to have coronary artery calcifications on 3 vessels.  Getting an in office EKG today showing NSR, rate of 88, normal axis.  Referring to cardiology for further evaluation. - Ambulatory referral to Cardiology  Return in about 6 months (around 10/01/2024) for HTN/HLD follow-up.   Carmeline Kowal, NP

## 2024-04-01 NOTE — Patient Instructions (Signed)
 Preventive Care 43 Years and Older, Female Preventive care refers to lifestyle choices and visits with your health care provider that can promote health and wellness. Preventive care visits are also called wellness exams. What can I expect for my preventive care visit? Counseling Your health care provider may ask you questions about your: Medical history, including: Past medical problems. Family medical history. Pregnancy and menstrual history. History of falls. Current health, including: Memory and ability to understand (cognition). Emotional well-being. Home life and relationship well-being. Sexual activity and sexual health. Lifestyle, including: Alcohol, nicotine or tobacco, and drug use. Access to firearms. Diet, exercise, and sleep habits. Work and work Astronomer. Sunscreen use. Safety issues such as seatbelt and bike helmet use. Physical exam Your health care provider will check your: Height and weight. These may be used to calculate your BMI (body mass index). BMI is a measurement that tells if you are at a healthy weight. Waist circumference. This measures the distance around your waistline. This measurement also tells if you are at a healthy weight and may help predict your risk of certain diseases, such as type 2 diabetes and high blood pressure. Heart rate and blood pressure. Body temperature. Skin for abnormal spots. What immunizations do I need?  Vaccines are usually given at various ages, according to a schedule. Your health care provider will recommend vaccines for you based on your age, medical history, and lifestyle or other factors, such as travel or where you work. What tests do I need? Screening Your health care provider may recommend screening tests for certain conditions. This may include: Lipid and cholesterol levels. Hepatitis C test. Hepatitis B test. HIV (human immunodeficiency virus) test. STI (sexually transmitted infection) testing, if you are at  risk. Lung cancer screening. Colorectal cancer screening. Diabetes screening. This is done by checking your blood sugar (glucose) after you have not eaten for a while (fasting). Mammogram. Talk with your health care provider about how often you should have regular mammograms. BRCA-related cancer screening. This may be done if you have a family history of breast, ovarian, tubal, or peritoneal cancers. Bone density scan. This is done to screen for osteoporosis. Talk with your health care provider about your test results, treatment options, and if necessary, the need for more tests. Follow these instructions at home: Eating and drinking  Eat a diet that includes fresh fruits and vegetables, whole grains, lean protein, and low-fat dairy products. Limit your intake of foods with high amounts of sugar, saturated fats, and salt. Take vitamin and mineral supplements as recommended by your health care provider. Do not drink alcohol if your health care provider tells you not to drink. If you drink alcohol: Limit how much you have to 0-1 drink a day. Know how much alcohol is in your drink. In the [place] one drink equals one 12 oz bottle of beer (355 mL), one 5 oz glass of wine (148 mL), or one 1 oz glass of hard liquor (44 mL). Lifestyle Brush your teeth every morning and night with fluoride toothpaste. Floss one time each day. Exercise for at least 30 minutes 5 or more days each week. Do not use any products that contain nicotine or tobacco. These products include cigarettes, chewing tobacco, and vaping devices, such as e-cigarettes. If you need help quitting, ask your health care provider. Do not use drugs. If you are sexually active, practice safe sex. Use a condom or other form of protection in order to prevent STIs. Take aspirin only as told by  your health care provider. Make sure that you understand how much to take and what form to take. Work with your health care provider to find out whether it  is safe and beneficial for you to take aspirin daily. Ask your health care provider if you need to take a cholesterol-lowering medicine (statin). Find healthy ways to manage stress, such as: Meditation, yoga, or listening to music. Journaling. Talking to a trusted person. Spending time with friends and family. Minimize exposure to UV radiation to reduce your risk of skin cancer. Safety Always wear your seat belt while driving or riding in a vehicle. Do not drive: If you have been drinking alcohol. Do not ride with someone who has been drinking. When you are tired or distracted. While texting. If you have been using any mind-altering substances or drugs. Wear a helmet and other protective equipment during sports activities. If you have firearms in your house, make sure you follow all gun safety procedures. What's next? Visit your health care provider once a year for an annual wellness visit. Ask your health care provider how often you should have your eyes and teeth checked. Stay up to date on all vaccines. This information is not intended to replace advice given to you by your health care provider. Make sure you discuss any questions you have with your health care provider. Document Revised: 05/19/2021 Document Reviewed: 05/19/2021 Elsevier Patient Education  2024 [employer].

## 2024-04-02 ENCOUNTER — Encounter: Payer: Self-pay | Admitting: Medical-Surgical

## 2024-04-02 LAB — CMP14+EGFR
ALT: 29 IU/L (ref 0–32)
AST: 30 IU/L (ref 0–40)
Albumin: 4.6 g/dL (ref 3.8–4.8)
Alkaline Phosphatase: 113 IU/L (ref 44–121)
BUN/Creatinine Ratio: 15 (ref 12–28)
BUN: 17 mg/dL (ref 8–27)
Bilirubin Total: 0.2 mg/dL (ref 0.0–1.2)
CO2: 21 mmol/L (ref 20–29)
Calcium: 9.9 mg/dL (ref 8.7–10.3)
Chloride: 104 mmol/L (ref 96–106)
Creatinine, Ser: 1.11 mg/dL — ABNORMAL HIGH (ref 0.57–1.00)
Globulin, Total: 2.3 g/dL (ref 1.5–4.5)
Glucose: 106 mg/dL — ABNORMAL HIGH (ref 70–99)
Potassium: 3.5 mmol/L (ref 3.5–5.2)
Sodium: 142 mmol/L (ref 134–144)
Total Protein: 6.9 g/dL (ref 6.0–8.5)
eGFR: 53 mL/min/{1.73_m2} — ABNORMAL LOW (ref >59)

## 2024-04-02 LAB — LIPID PANEL
Chol/HDL Ratio: 2.5 ratio (ref 0.0–4.4)
Cholesterol, Total: 165 mg/dL (ref 100–199)
HDL: 67 mg/dL (ref >39)
LDL Chol Calc (NIH): 79 mg/dL (ref 0–99)
Triglycerides: 106 mg/dL (ref 0–149)
VLDL Cholesterol Cal: 19 mg/dL (ref 5–40)

## 2024-04-02 LAB — IRON,TIBC AND FERRITIN PANEL
Ferritin: 34 ng/mL (ref 15–150)
Iron Saturation: 28 % (ref 15–55)
Iron: 112 ug/dL (ref 27–139)
Total Iron Binding Capacity: 396 ug/dL (ref 250–450)
UIBC: 284 ug/dL (ref 118–369)

## 2024-04-02 LAB — CBC WITH DIFFERENTIAL/PLATELET
Basophils Absolute: 0.1 10*3/uL (ref 0.0–0.2)
Basos: 1 %
EOS (ABSOLUTE): 0.1 10*3/uL (ref 0.0–0.4)
Eos: 2 %
Hematocrit: 33.9 % — ABNORMAL LOW (ref 34.0–46.6)
Hemoglobin: 11.3 g/dL (ref 11.1–15.9)
Immature Grans (Abs): 0 10*3/uL (ref 0.0–0.1)
Immature Granulocytes: 0 %
Lymphocytes Absolute: 1.9 10*3/uL (ref 0.7–3.1)
Lymphs: 36 %
MCH: 31.4 pg (ref 26.6–33.0)
MCHC: 33.3 g/dL (ref 31.5–35.7)
MCV: 94 fL (ref 79–97)
Monocytes Absolute: 0.5 10*3/uL (ref 0.1–0.9)
Monocytes: 10 %
Neutrophils Absolute: 2.8 10*3/uL (ref 1.4–7.0)
Neutrophils: 51 %
Platelets: 264 10*3/uL (ref 150–450)
RBC: 3.6 x10E6/uL — ABNORMAL LOW (ref 3.77–5.28)
RDW: 13 % (ref 11.7–15.4)
WBC: 5.4 10*3/uL (ref 3.4–10.8)

## 2024-04-02 LAB — MAGNESIUM: Magnesium: 2 mg/dL (ref 1.6–2.3)

## 2024-04-02 LAB — TSH: TSH: 0.897 u[IU]/mL (ref 0.450–4.500)

## 2024-04-02 LAB — VITAMIN D 25 HYDROXY (VIT D DEFICIENCY, FRACTURES): Vit D, 25-Hydroxy: 45.3 ng/mL (ref 30.0–100.0)

## 2024-04-03 NOTE — Addendum Note (Signed)
 Addended by: Doretha Ganja on: 04/03/2024 02:41 PM   Modules accepted: Orders

## 2024-04-22 ENCOUNTER — Other Ambulatory Visit: Payer: Self-pay | Admitting: Medical-Surgical

## 2024-04-22 DIAGNOSIS — E782 Mixed hyperlipidemia: Secondary | ICD-10-CM

## 2024-05-08 DIAGNOSIS — G5133 Clonic hemifacial spasm, bilateral: Secondary | ICD-10-CM | POA: Diagnosis not present

## 2024-05-08 DIAGNOSIS — G245 Blepharospasm: Secondary | ICD-10-CM | POA: Diagnosis not present

## 2024-05-08 DIAGNOSIS — G510 Bell's palsy: Secondary | ICD-10-CM | POA: Diagnosis not present

## 2024-05-21 ENCOUNTER — Encounter: Payer: Self-pay | Admitting: Cardiology

## 2024-05-21 ENCOUNTER — Ambulatory Visit
Admission: RE | Admit: 2024-05-21 | Discharge: 2024-05-21 | Disposition: A | Discharge status: Home / Self Care | Source: Ambulatory Visit | Attending: Cardiology | Admitting: Cardiology

## 2024-05-21 ENCOUNTER — Ambulatory Visit (INDEPENDENT_AMBULATORY_CARE_PROVIDER_SITE_OTHER): Admitting: Cardiology

## 2024-05-21 VITALS — BP 120/68 | HR 76 | Ht 65.0 in | Wt 118.0 lb

## 2024-05-21 DIAGNOSIS — I25119 Atherosclerotic heart disease of native coronary artery with unspecified angina pectoris: Secondary | ICD-10-CM

## 2024-05-21 DIAGNOSIS — E782 Mixed hyperlipidemia: Secondary | ICD-10-CM

## 2024-05-21 DIAGNOSIS — R011 Cardiac murmur, unspecified: Secondary | ICD-10-CM | POA: Diagnosis not present

## 2024-05-21 DIAGNOSIS — Z87891 Personal history of nicotine dependence: Secondary | ICD-10-CM

## 2024-05-21 DIAGNOSIS — I10 Essential (primary) hypertension: Secondary | ICD-10-CM | POA: Insufficient documentation

## 2024-05-21 DIAGNOSIS — I2510 Atherosclerotic heart disease of native coronary artery without angina pectoris: Secondary | ICD-10-CM | POA: Insufficient documentation

## 2024-05-21 DIAGNOSIS — I209 Angina pectoris, unspecified: Secondary | ICD-10-CM

## 2024-05-21 HISTORY — DX: Personal history of nicotine dependence: Z87.891

## 2024-05-21 HISTORY — DX: Angina pectoris, unspecified: I20.9

## 2024-05-21 HISTORY — DX: Atherosclerotic heart disease of native coronary artery without angina pectoris: I25.10

## 2024-05-21 MED ORDER — ASPIRIN 81 MG PO TBEC: 81.0000 mg | DELAYED_RELEASE_TABLET | Freq: Every day | ORAL | Status: AC

## 2024-05-21 MED ORDER — NITROGLYCERIN 0.4 MG SL SUBL: 0.4000 mg | SUBLINGUAL_TABLET | SUBLINGUAL | 2 refills | Status: AC | PRN

## 2024-05-21 NOTE — Progress Notes (Addendum)
 Cardiology Office Note:    Date:  05/21/2024   ID:  Krista [name], DOB [birth year], MRN [id number]  PCP:  Cherre Cornish, NP  Cardiologist:  Nelia Balzarine, MD   Referring MD: Cherre Cornish, NP    ASSESSMENT:    1. Essential hypertension   2. Mixed hyperlipidemia   3. Angina pectoris (HCC)   4. Former smoker   5. Cardiac murmur   6. Coronary artery calcification seen on CT scan    PLAN:    In order of problems listed above:  Primary prevention stressed with the patient.  Importance of compliance with diet medication stressed and patient verbalized standing. Angina pectoris: Patient's symptoms are concerning and following recommendations were made to the patient.  She was advised to take a coated baby aspirin on a daily basis.  Sublingual nitroglycerin prescription was sent, its protocol and 911 protocol explained and the patient vocalized understanding questions were answered to the patient's satisfaction.  I gave her options of evaluation with invasive and noninvasive modalities and she prefers CT coronary angiography with FFR.  In the interim I have told her not to exercise and not to exert herself. Essential hypertension: Blood pressure stable and diet was emphasized. Mixed dyslipidemia: On lipid-lowering medications followed by primary care. Former smoker: Promises never to go back to smoking. Patient will be seen in follow-up appointment in 6 months or earlier if the patient has any concerns.    Medication Adjustments/Labs and Tests Ordered: Current medicines are reviewed at length with the patient today.  Concerns regarding medicines are outlined above.  Orders Placed This Encounter  Procedures   CT CORONARY FRACTIONAL FLOW RESERVE FLUID ANALYSIS   EKG 12-Lead   ECHOCARDIOGRAM COMPLETE   Meds ordered this encounter  Medications   aspirin EC 81 MG tablet    Sig: Take 1 tablet (81 mg total) by mouth daily. Swallow whole.   nitroGLYCERIN (NITROSTAT) 0.4 MG SL tablet     Sig: Place 1 tablet (0.4 mg total) under the tongue every 5 (five) minutes as needed for chest pain.    Dispense:  25 tablet    Refill:  2     History of Present Illness:    Krista [name] is a 73 y.o. female who is being seen today for the evaluation of chest pain at the request of Cherre Cornish, NP.  Patient is a pleasant 73 year old female.  She has past medical history of essential hypertension, dyslipidemia and is a former smoker.  She tells me that she walks on a regular basis.  She has been noticing substernal chest tightness.  She feels that she has a vice around her chest.  It goes to the neck at the arms at times.  No orthopnea or PND it can also occur at rest and during stress episodes.  At the time of my evaluation, the patient is alert awake oriented and in no distress.  This has been going on for the past 1 month.  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    Depression    2021   Facial asymmetry, acquired 1974  left side paralysis   GERD (gastroesophageal reflux disease) 2021   H/O hysterectomy for benign disease 08/15/2017   H/O: Bell's palsy 1974   per pt during pregnancy , left side paralysis, told possible bell's palsy or stroke, after testing done had left mastoidectomy;  residual left side facialy asymmetry   History of basal cell carcinoma (BCC) excision    History of Bell's palsy 08/15/2017   History of cerebral aneurysm 2010   s/p coiling  ;  per pt was an incidental finding on imaging   History of Clostridioides difficile infection 2010   treated   History of colon polyps 08/15/2017   History of  COVID-19 05/08/2021   positive result in care everywhere; per pt mild to moderate symptoms that resolved   History of Helicobacter pylori infection 2010   treated   History of left mastoidectomy 03/07/2018   History of nonmelanoma skin cancer 08/15/2017   History of osteoporosis 08/15/2017   Need records     Hyperlipidemia    Hypertension    followed by pcp   Lymphocytic colitis    followed by dr c. Danella Dunn (GI);  dx by biopsy 09-13-2017   Macular degeneration of both eyes    Non-intractable vomiting with nausea    followed by dr Danella Dunn   OA (osteoarthritis)    Osteoporosis    Pneumonia    PONV (postoperative nausea and vomiting)    Spondylosis, unspecified    cervical and lumbar   Wears hearing aid in both ears     Past Surgical History:  Procedure Laterality Date   ANTERIOR LAT LUMBAR FUSION N/A 12/13/2022   Procedure: PRONE TRANSPSOAS INTERBODY FUSION, LUMBAR THREE-FOUR;  Surgeon: Augusto Blonder, MD;  Location: [facility] OR;  Service: Neurosurgery;  Laterality: N/A;  3C   CARPAL TUNNEL RELEASE Bilateral    early 2000s   CATARACT EXTRACTION W/ INTRAOCULAR LENS IMPLANT Bilateral 2014   CEREBRAL ANEURYSM REPAIR  2010   endocerebral coiling   COLONOSCOPY  09/13/2017   COSMETIC SURGERY     Mohrs Surgery on nose   DISTAL INTERPHALANGEAL JOINT FUSION Left 09/16/2021   Procedure: left small finger distal interphalangeal joint fusion;  Surgeon: Ltanya Rummer, MD;  Location: [facility];  Service: Orthopedics;  Laterality: Left;  with MAC anesthesia   ESOPHAGOGASTRODUODENOSCOPY  09/22/2020   EYE SURGERY  2020   Blepharoplasty   FACIAL RECONSTRUCTION SURGERY  03/28/2022   [facility]   FOOT SURGERY Bilateral    eary 2000s;  bilateral bunionectomy  and removal bone right 5th toe   FRACTURE SURGERY  1968   Shattered right ankle   MASTOIDECTOMY Left 1974   RADIOFREQUENCY ABLATION  09/01/2021   lumbar , left side   ROTATOR CUFF REPAIR Right 2014    SALPINGOOPHORECTOMY Bilateral 1995   w/ excision endometriosis via laparotomy   SPINE SURGERY  2021/2022   DDD, osteoarthritis   TONSILLECTOMY AND ADENOIDECTOMY  1970   TOTAL ABDOMINAL HYSTERECTOMY  1987   TRIGGER FINGER RELEASE Bilateral    early 2000s; one finger each hand   TUBAL LIGATION  1982   WRIST GANGLION EXCISION Left    2000s    Current Medications: Current Meds  Medication Sig   aspirin EC 81 MG tablet Take 1 tablet (81 mg total) by mouth daily. Swallow whole.   atorvastatin  (LIPITOR) 40 MG tablet Take 1 tablet (40 mg total) by mouth daily.   celecoxib (CELEBREX) 100 MG capsule Take 100 mg by mouth daily.  cholecalciferol (VITAMIN D3) 25 MCG (1000 UNIT) tablet Take 1,000 Units by mouth every evening.   colchicine 0.6 MG tablet Take 0.6 mg by mouth 2 (two) times daily.   cyclobenzaprine  (FLEXERIL ) 10 MG tablet TAKE 1/2 TO 1 TABLET BY MOUTH 3 TIMES A DAY AS NEEDED MUSCLE SPASMS (Patient taking differently: Take 10 mg by mouth in the morning, at noon, and at bedtime.)   diphenhydramine -acetaminophen  (TYLENOL  PM) 25-500 MG TABS tablet Take 2 tablets by mouth at bedtime.   gabapentin  (NEURONTIN ) 100 MG capsule Take 100 mg by mouth at bedtime.   meloxicam  (MOBIC ) 15 MG tablet Take 15 mg by mouth daily.   nitroGLYCERIN (NITROSTAT) 0.4 MG SL tablet Place 1 tablet (0.4 mg total) under the tongue every 5 (five) minutes as needed for chest pain.   valsartan -hydrochlorothiazide  (DIOVAN -HCT) 160-12.5 MG tablet Take 1 tablet by mouth daily.     Allergies:   Codeine, Colestipol, Kiwi extract, Other, Plum pulp, Tape, Tomato, and Prednisone    Social History   Socioeconomic History   Marital status: Married    Spouse name: Randal   Number of children: 2   Years of education: 14   Highest education level: Associate degree: academic program  Occupational History   Occupation: retired    Comment: Control and instrumentation engineer  Tobacco Use   Smoking status: Former    Current packs/day: 0.00     Average packs/day: 0.8 packs/day for 42.0 years (31.5 ttl pk-yrs)    Types: Cigarettes    Start date: 10/06/1967    Quit date: 10/05/2009    Years since quitting: 14.6   Smokeless tobacco: Never   Tobacco comments:    Quit Date 10/01/2011  Vaping Use   Vaping status: Never Used  Substance and Sexual Activity   Alcohol use: Not Currently    Alcohol/week: 1.0 standard drink of alcohol    Types: 1 Glasses of wine per week    Comment: Rarely   Drug use: Never   Sexual activity: Not Currently    Birth control/protection: Abstinence, None  Other Topics Concern   Not on file  Social History Narrative   Lives with her husband. She enjoys playing with her grandson.   Social Drivers of Corporate investment banker Strain: Low Risk  (02/07/2024)   Overall Financial Resource Strain (CARDIA)    Difficulty of Paying Living Expenses: Not hard at all  Food Insecurity: No Food Insecurity (02/07/2024)   Hunger Vital Sign    Worried About Running Out of Food in the Last Year: Never true    Ran Out of Food in the Last Year: Never true  Transportation Needs: No Transportation Needs (02/07/2024)   PRAPARE - Administrator, Civil Service (Medical): No    Lack of Transportation (Non-Medical): No  Physical Activity: Insufficiently Active (02/07/2024)   Exercise Vital Sign    Days of Exercise per Week: 7 days    Minutes of Exercise per Session: 20 min  Stress: No Stress Concern Present (02/07/2024)   [employer] of Occupational Health - Occupational Stress Questionnaire    Feeling of Stress : Not at all  Social Connections: Socially Integrated (02/07/2024)   Social Connection and Isolation Panel    Frequency of Communication with Friends and Family: More than three times a week    Frequency of Social Gatherings with Friends and Family: Three times a week    Attends Religious Services: More than 4 times per year    Active Member of Clubs or  Organizations: Yes    Attends Museum/gallery exhibitions officer: More than 4 times per year    Marital Status: Married     Family History: The patient's family history includes ADD / ADHD in her son; Alcohol abuse in her brother, brother, daughter, father, sister, and son; Arthritis in her brother, mother, sister, sister, and sister; Cancer in her brother, father, and sister; Early death in her father; Hearing loss in her brother, father, and mother; Heart disease in her mother and sister; Heart failure in her mother; Hypertension in her mother; Stroke in her mother; Varicose Veins in her mother.  ROS:   Please see the history of present illness.    All other systems reviewed and are negative.  EKGs/Labs/Other Studies Reviewed:    The following studies were reviewed today:  EKG Interpretation Date/Time:  Tuesday May 21 2024 10:35:27 EDT Ventricular Rate:  76 PR Interval:  192 QRS Duration:  70 QT Interval:  406 QTC Calculation: 456 R Axis:   77  Text Interpretation: Normal sinus rhythm Septal infarct , age undetermined When compared with ECG of 08-Dec-2022 15:32, Septal infarct is now Present Confirmed by Hillis Lu [id number]) on 05/21/2024 10:55:02 AM     Recent Labs: 04/01/2024: ALT 29; BUN 17; Creatinine, Ser 1.11; Hemoglobin 11.3; Magnesium  2.0; Platelets 264; Potassium 3.5; Sodium 142; TSH 0.897  Recent Lipid Panel    Component Value Date/Time   CHOL 165 04/01/2024 1404   TRIG 106 04/01/2024 1404   HDL 67 04/01/2024 1404   CHOLHDL 2.5 04/01/2024 1404   CHOLHDL 3.2 05/09/2023 1428   LDLCALC 79 04/01/2024 1404   LDLCALC 118 (H) 05/09/2023 1428    Physical Exam:    VS:  BP 120/68   Pulse 76   Ht 5' 5 (1.651 m)   Wt 118 lb (53.5 kg)   SpO2 94%   BMI 19.64 kg/m     Wt Readings from Last 3 Encounters:  05/21/24 118 lb (53.5 kg)  04/01/24 118 lb (53.5 kg)  02/07/24 116 lb (52.6 kg)     GEN: Patient is in no acute distress HEENT: Normal NECK: No JVD; No carotid bruits LYMPHATICS: No  lymphadenopathy CARDIAC: S1 S2 regular, 2/6 systolic murmur at the apex. RESPIRATORY:  Clear to auscultation without rales, wheezing or rhonchi  ABDOMEN: Soft, non-tender, non-distended MUSCULOSKELETAL:  No edema; No deformity  SKIN: Warm and dry NEUROLOGIC:  Alert and oriented x 3 PSYCHIATRIC:  Normal affect    Signed, Nelia Balzarine, MD  05/21/2024 11:32 AM    [facility]

## 2024-05-21 NOTE — Patient Instructions (Addendum)
 Medication Instructions:  Start aspirin 81 mg once a day  Start Nitroglycerin 0.4 mg under the tongue every 5 (five) minutes as needed for chest pain. If chest pain persist after taking three tablets call EMS *If you need a refill on your cardiac medications before your next appointment, please call your pharmacy*  Lab Work: No labs If you have labs (blood work) drawn today and your tests are completely normal, you will receive your results only by: MyChart Message (if you have MyChart) OR A paper copy in the mail If you have any lab test that is abnormal or we need to change your treatment, we will call you to review the results.  Testing/Procedures: We are going to order for you a CT-FFR to scan the blood flow of the heart.  Your physician has requested that you have an echocardiogram. Echocardiography is a painless test that uses sound waves to create images of your heart. It provides your doctor with information about the size and shape of your heart and how well your heart's chambers and valves are working. This procedure takes approximately one hour. There are no restrictions for this procedure. Please do NOT wear cologne, perfume, aftershave, or lotions (deodorant is allowed). Please arrive 15 minutes prior to your appointment time.  Please note: We ask at that you not bring children with you during ultrasound (echo/ vascular) testing. Due to room size and safety concerns, children are not allowed in the ultrasound rooms during exams. Our front office staff cannot provide observation of children in our lobby area while testing is being conducted. An adult accompanying a patient to their appointment will only be allowed in the ultrasound room at the discretion of the ultrasound technician under special circumstances. We apologize for any inconvenience.  Follow-Up: At [facility], you and your health needs are our priority.  As part of our continuing mission to provide you with  exceptional heart care, our providers are all part of one team.  This team includes your primary Cardiologist (physician) and Advanced Practice Providers or APPs (Physician Assistants and Nurse Practitioners) who all work together to provide you with the care you need, when you need it.  Your next appointment:   9 month(s)  Provider:   Nelia Balzarine, MD    We recommend signing up for the patient portal called MyChart.  Sign up information is provided on this After Visit Summary.  MyChart is used to connect with patients for Virtual Visits (Telemedicine).  Patients are able to view lab/test results, encounter notes, upcoming appointments, etc.  Non-urgent messages can be sent to your provider as well.   To learn more about what you can do with MyChart, go to [email or web address].

## 2024-06-12 DIAGNOSIS — M47816 Spondylosis without myelopathy or radiculopathy, lumbar region: Secondary | ICD-10-CM | POA: Diagnosis not present

## 2024-06-24 DIAGNOSIS — M65341 Trigger finger, right ring finger: Secondary | ICD-10-CM | POA: Diagnosis not present

## 2024-06-27 ENCOUNTER — Other Ambulatory Visit: Payer: Self-pay

## 2024-06-27 DIAGNOSIS — I10 Essential (primary) hypertension: Secondary | ICD-10-CM

## 2024-06-27 MED ORDER — VALSARTAN-HYDROCHLOROTHIAZIDE 160-12.5 MG PO TABS: 1.0000 | ORAL_TABLET | Freq: Every day | ORAL | 1 refills | Status: DC

## 2024-07-02 ENCOUNTER — Ambulatory Visit: Payer: Self-pay | Admitting: Cardiology

## 2024-07-02 ENCOUNTER — Ambulatory Visit (HOSPITAL_BASED_OUTPATIENT_CLINIC_OR_DEPARTMENT_OTHER)
Admission: RE | Admit: 2024-07-02 | Discharge: 2024-07-02 | Disposition: A | Discharge status: Home / Self Care | Source: Ambulatory Visit | Attending: Cardiology | Admitting: Cardiology

## 2024-07-02 DIAGNOSIS — R011 Cardiac murmur, unspecified: Secondary | ICD-10-CM | POA: Insufficient documentation

## 2024-07-02 LAB — ECHOCARDIOGRAM COMPLETE
AR max vel: 1.13 cm2
AV Area VTI: 1.09 cm2
AV Area mean vel: 1.09 cm2
AV Mean grad: 6 mmHg
AV Peak grad: 9.7 mmHg
Ao pk vel: 1.56 m/s
Area-P 1/2: 3.5 cm2
MV M vel: 4.23 m/s
MV Peak grad: 71.6 mmHg
S' Lateral: 1.5 cm
Single Plane A2C EF: 61.4 %
Single Plane A4C EF: 62.5 %

## 2024-07-03 DIAGNOSIS — H04123 Dry eye syndrome of bilateral lacrimal glands: Secondary | ICD-10-CM | POA: Diagnosis not present

## 2024-07-03 DIAGNOSIS — H52223 Regular astigmatism, bilateral: Secondary | ICD-10-CM | POA: Diagnosis not present

## 2024-07-03 DIAGNOSIS — H524 Presbyopia: Secondary | ICD-10-CM | POA: Diagnosis not present

## 2024-07-03 DIAGNOSIS — H5213 Myopia, bilateral: Secondary | ICD-10-CM | POA: Diagnosis not present

## 2024-07-03 DIAGNOSIS — Z961 Presence of intraocular lens: Secondary | ICD-10-CM | POA: Diagnosis not present

## 2024-07-04 ENCOUNTER — Ambulatory Visit: Payer: Self-pay

## 2024-07-04 NOTE — Telephone Encounter (Signed)
 FYI Only or Action Required?: FYI only for provider.  Patient was last seen in primary care on 04/01/2024 by Krista Mini, NP.  Called Nurse Triage reporting Hip Pain.  Symptoms began several weeks ago.  Interventions attempted: Nothing.  Symptoms are: unchanged. Hip pain and scrapes from a fall 2 weeks ago.  Triage Disposition: See PCP When Office is Open (Within 3 Days)  Patient/caregiver understands and will follow disposition?: Yes                    Copied from CRM #[id number]. Topic: Clinical - Red Word Triage >> Jul 04, 2024  8:55 AM Merlynn LABOR wrote: Red Word that prompted transfer to Nurse Triage: Fall 2 weeks ago, Left hip pain, difficulty walking Reason for Disposition  [1] MODERATE pain (e.g., interferes with normal activities, limping) AND [2] present > 3 days  Answer Assessment - Initial Assessment Questions 1. LOCATION and RADIATION: Where is the pain located? Does the pain spread (shoot) anywhere else?     All the way down the leg to ankle 2. QUALITY: What does the pain feel like?  (e.g., sharp, dull, aching, burning)     sharp 3. SEVERITY: How bad is the pain? What does it keep you from doing?   (Scale 1-10; or mild, moderate, severe)     moderate 4. ONSET: When did the pain start? Does it come and go, or is it there all the time?     2 weeks 5. WORK OR EXERCISE: Has there been any recent work or exercise that involved this part of the body?      Fell 2 weeks ago 6. CAUSE: What do you think is causing the hip pain?      fall 7. AGGRAVATING FACTORS: What makes the hip pain worse? (e.g., walking, climbing stairs, running)     Walking is painful. 8. OTHER SYMPTOMS: Do you have any other symptoms? (e.g., back pain, pain shooting down leg,  fever, rash)     Scrapes from fall - healing  Protocols used: Hip Pain-A-AH

## 2024-07-05 ENCOUNTER — Encounter: Payer: Self-pay | Admitting: Medical-Surgical

## 2024-07-05 ENCOUNTER — Ambulatory Visit (INDEPENDENT_AMBULATORY_CARE_PROVIDER_SITE_OTHER): Admitting: Medical-Surgical

## 2024-07-05 ENCOUNTER — Ambulatory Visit (INDEPENDENT_AMBULATORY_CARE_PROVIDER_SITE_OTHER)

## 2024-07-05 ENCOUNTER — Other Ambulatory Visit: Payer: Self-pay

## 2024-07-05 VITALS — BP 128/72 | HR 80 | Resp 20 | Ht 65.0 in | Wt 120.0 lb

## 2024-07-05 DIAGNOSIS — M25572 Pain in left ankle and joints of left foot: Secondary | ICD-10-CM | POA: Diagnosis not present

## 2024-07-05 DIAGNOSIS — M25552 Pain in left hip: Secondary | ICD-10-CM

## 2024-07-05 DIAGNOSIS — E782 Mixed hyperlipidemia: Secondary | ICD-10-CM

## 2024-07-05 DIAGNOSIS — Z981 Arthrodesis status: Secondary | ICD-10-CM | POA: Diagnosis not present

## 2024-07-05 DIAGNOSIS — M5430 Sciatica, unspecified side: Secondary | ICD-10-CM | POA: Diagnosis not present

## 2024-07-05 DIAGNOSIS — M8580 Other specified disorders of bone density and structure, unspecified site: Secondary | ICD-10-CM | POA: Diagnosis not present

## 2024-07-05 MED ORDER — GABAPENTIN 300 MG PO CAPS: 300.0000 mg | ORAL_CAPSULE | Freq: Three times a day (TID) | ORAL | 3 refills | Status: DC

## 2024-07-05 MED ORDER — METHYLPREDNISOLONE 4 MG PO TBPK: ORAL_TABLET | ORAL | 0 refills | Status: DC

## 2024-07-05 NOTE — Progress Notes (Signed)
        Established [name] visit   History of Present Illness   Discussed the use of AI scribe software for clinical note transcription with the [name], who gave verbal consent to proceed.  History of Present Illness   Krista [name] is a 73 year old female who presents with hip and leg pain following a fall.  She experienced significant pain in her hip and leg after a fall on July 16th at [facility]. She tripped while walking up two steps, resulting in pain radiating from her hip down to her ankle. The pain is severe, starting across her hip, going down across the bottom of her buttock, and around to her ankle. She is able to walk, but it is very painful. There was bruising at the site of pain.  Her current medications include gabapentin , taken twice a day for pain and arthritis symptoms. She has previously taken methylprednisolone  without issues but has adverse reactions to prednisone . She is also taking Lipitor for cholesterol management.      Physical Exam   Physical Exam Vitals reviewed.  Constitutional:      General: She is not in acute distress.    Appearance: Normal appearance.  HENT:     Head: Normocephalic and atraumatic.  Cardiovascular:     Rate and Rhythm: Normal rate and regular rhythm.     Pulses: Normal pulses.     Heart sounds: Normal heart sounds. No murmur heard.    No friction rub. No gallop.  Pulmonary:     Effort: Pulmonary effort is normal. No respiratory distress.     Breath sounds: Normal breath sounds. No wheezing.  Skin:    General: Skin is warm and dry.  Neurological:     Mental Status: She is alert and oriented to person, place, and time.  Psychiatric:        Mood and Affect: Mood normal.        Behavior: Behavior normal.        Thought Content: Thought content normal.        Judgment: Judgment normal.     Assessment & Plan   Assessment and Plan    Left hip and leg pain after fall Acute pain in the left hip and leg post-fall with severe  tenderness. Differential includes soft tissue injury or fracture. - Order x-rays of the left hip and leg to rule out fracture. - Prescribe methylprednisolone  Dosepak for inflammation. - Increase gabapentin  to 300 mg up to three times a day. - Recommend over-the-counter Tiger Balm or lidocaine  patches. - Provide home exercises and stretches. - Reassess in 4-6 weeks; consider MRI or CT if no improvement.  Osteoarthritis Chronic osteoarthritis with pain exacerbation. - Increase gabapentin  to 300 mg up to three times a day.      Follow up   Return if symptoms worsen or fail to improve.  __________________________________ Zada FREDRIK Palin, DNP, APRN, FNP-BC Primary Care and Sports Medicine [facility]

## 2024-07-06 ENCOUNTER — Other Ambulatory Visit: Payer: Self-pay | Admitting: Medical-Surgical

## 2024-07-08 ENCOUNTER — Ambulatory Visit: Payer: Self-pay | Admitting: Medical-Surgical

## 2024-07-08 NOTE — Telephone Encounter (Signed)
 The pharmacy is requesting a change

## 2024-07-16 DIAGNOSIS — K120 Recurrent oral aphthae: Secondary | ICD-10-CM | POA: Diagnosis not present

## 2024-07-16 DIAGNOSIS — Z79899 Other long term (current) drug therapy: Secondary | ICD-10-CM | POA: Diagnosis not present

## 2024-07-22 DIAGNOSIS — M65341 Trigger finger, right ring finger: Secondary | ICD-10-CM | POA: Diagnosis not present

## 2024-08-06 ENCOUNTER — Encounter: Payer: Self-pay | Admitting: Sports Medicine

## 2024-08-06 ENCOUNTER — Ambulatory Visit

## 2024-08-06 DIAGNOSIS — Z87891 Personal history of nicotine dependence: Secondary | ICD-10-CM | POA: Diagnosis not present

## 2024-08-06 DIAGNOSIS — Z122 Encounter for screening for malignant neoplasm of respiratory organs: Secondary | ICD-10-CM

## 2024-08-15 DIAGNOSIS — G245 Blepharospasm: Secondary | ICD-10-CM | POA: Diagnosis not present

## 2024-08-15 DIAGNOSIS — G510 Bell's palsy: Secondary | ICD-10-CM | POA: Diagnosis not present

## 2024-08-15 DIAGNOSIS — G5133 Clonic hemifacial spasm, bilateral: Secondary | ICD-10-CM | POA: Diagnosis not present

## 2024-08-19 ENCOUNTER — Telehealth: Payer: Self-pay | Admitting: Acute Care

## 2024-08-19 DIAGNOSIS — H6123 Impacted cerumen, bilateral: Secondary | ICD-10-CM | POA: Diagnosis not present

## 2024-08-19 NOTE — Telephone Encounter (Signed)
 I have attempted to call the patient with the results of their  Low Dose CT Chest Lung cancer screening scan. There was no answer. I have left a HIPPA compliant VM requesting the patient call the office for the scan results. I included the office contact information in the message. We will await his return call. If no return call we will continue to call until patient is contacted.    This is a 4 B. New 10.3 mm left upper lobe sub pleural nodule. Radiology feel this is potentially infectious or inflammatory. Please call and ask her if she has been sick, coughing up blood or had unintentional weight loss. If she has been sick, have her follow up with her PCP for treatment , then an 8 week follow up Ct Chest. If she has not been sick I would still like a follow up in 2 months. Thanks so much [employer].   Please fax results to PCP and let them know plan of care.

## 2024-08-19 NOTE — Telephone Encounter (Signed)
 Call report received:  IMPRESSION: 1. Lung-RADS 4B, suspicious. Additional imaging evaluation or consultation with Pulmonology or Thoracic Surgery recommended. New 10.3 mm medial apical left upper lobe subpleural nodule, potentially infectious or inflammatory. Follow up low-dose chest CT without contrast in 3 months (please use the following order, CT CHEST LCS NODULE FOLLOW-UP W/O CM) is recommended. 2. Three-vessel coronary atherosclerosis. 3. Aortic Atherosclerosis (ICD10-I70.0) and Emphysema (ICD10-J43.9).   These results will be called to the ordering clinician or representative by the Radiologist Assistant, and communication documented in the PACS or [employer].     Electronically Signed   By: Selinda DELENA Blue M.D.   On: 08/17/2024 20:44

## 2024-08-20 ENCOUNTER — Other Ambulatory Visit: Payer: Self-pay

## 2024-08-20 DIAGNOSIS — K120 Recurrent oral aphthae: Secondary | ICD-10-CM | POA: Diagnosis not present

## 2024-08-20 DIAGNOSIS — R911 Solitary pulmonary nodule: Secondary | ICD-10-CM

## 2024-08-20 DIAGNOSIS — Z87891 Personal history of nicotine dependence: Secondary | ICD-10-CM

## 2024-08-20 DIAGNOSIS — Z79899 Other long term (current) drug therapy: Secondary | ICD-10-CM | POA: Diagnosis not present

## 2024-08-20 DIAGNOSIS — Z122 Encounter for screening for malignant neoplasm of respiratory organs: Secondary | ICD-10-CM

## 2024-08-20 LAB — BASIC METABOLIC PANEL WITH GFR
BUN: 17 (ref 4–21)
CO2: 24 — AB (ref 13–22)
Chloride: 97 — AB (ref 99–108)
Creatinine: 1 (ref 0.5–1.1)
Glucose: 92
Potassium: 4.4 meq/L (ref 3.5–5.1)
Sodium: 135 — AB (ref 137–147)

## 2024-08-20 LAB — CBC AND DIFFERENTIAL
EGFR: 59
HCT: 34 — AB (ref 36–46)
Hemoglobin: 10.8 — AB (ref 12.0–16.0)
Neutrophils Absolute: 49
Platelets: 249 K/uL (ref 150–400)
WBC: 53

## 2024-08-20 LAB — HEPATIC FUNCTION PANEL
ALT: 15 U/L (ref 7–35)
AST: 23 (ref 13–35)
Alkaline Phosphatase: 92 (ref 25–125)
Bilirubin, Total: 0.2

## 2024-08-20 LAB — COMPREHENSIVE METABOLIC PANEL WITH GFR
Albumin: 4.3 (ref 3.5–5.0)
Calcium: 9.8 (ref 8.7–10.7)
Globulin: 2.1
eGFR: 59

## 2024-08-20 LAB — CBC: RBC: 3.47 — AB (ref 3.87–5.11)

## 2024-08-20 NOTE — Telephone Encounter (Signed)
 See provider note 08/19/2024

## 2024-08-20 NOTE — Telephone Encounter (Addendum)
 Called and spoke with patient. Reviewed recent Lung CT results. She denies any recent illness. No weight loss or coughing up blood. She is in agreement to complete a 2 month follow up scan to evaluate a new 10.3 mm nodule. Order placed and she has been scheduled for 10/07/2024 at [facility] at 10:30 am. Results and plan to PCP. No questions or concerns.

## 2024-08-27 ENCOUNTER — Encounter: Payer: Self-pay | Admitting: Medical-Surgical

## 2024-08-27 DIAGNOSIS — I10 Essential (primary) hypertension: Secondary | ICD-10-CM

## 2024-08-28 MED ORDER — COVID-19 MRNA VAC-TRIS(PFIZER) 30 MCG/0.3ML IM SUSY: 0.3000 mL | PREFILLED_SYRINGE | Freq: Once | INTRAMUSCULAR | 0 refills | Status: AC

## 2024-08-28 NOTE — Telephone Encounter (Signed)
 Pended prescription for vaccine

## 2024-08-29 ENCOUNTER — Ambulatory Visit (INDEPENDENT_AMBULATORY_CARE_PROVIDER_SITE_OTHER): Admitting: Medical-Surgical

## 2024-08-29 ENCOUNTER — Encounter: Payer: Self-pay | Admitting: Medical-Surgical

## 2024-08-29 ENCOUNTER — Ambulatory Visit (INDEPENDENT_AMBULATORY_CARE_PROVIDER_SITE_OTHER)

## 2024-08-29 VITALS — BP 108/67 | HR 75 | Resp 20 | Ht 65.0 in | Wt 122.0 lb

## 2024-08-29 DIAGNOSIS — J301 Allergic rhinitis due to pollen: Secondary | ICD-10-CM

## 2024-08-29 DIAGNOSIS — J441 Chronic obstructive pulmonary disease with (acute) exacerbation: Secondary | ICD-10-CM

## 2024-08-29 DIAGNOSIS — R911 Solitary pulmonary nodule: Secondary | ICD-10-CM

## 2024-08-29 DIAGNOSIS — R0602 Shortness of breath: Secondary | ICD-10-CM

## 2024-08-29 DIAGNOSIS — D649 Anemia, unspecified: Secondary | ICD-10-CM

## 2024-08-29 DIAGNOSIS — R059 Cough, unspecified: Secondary | ICD-10-CM

## 2024-08-29 DIAGNOSIS — E538 Deficiency of other specified B group vitamins: Secondary | ICD-10-CM | POA: Diagnosis not present

## 2024-08-29 DIAGNOSIS — K30 Functional dyspepsia: Secondary | ICD-10-CM

## 2024-08-29 DIAGNOSIS — Z23 Encounter for immunization: Secondary | ICD-10-CM | POA: Diagnosis not present

## 2024-08-29 DIAGNOSIS — N289 Disorder of kidney and ureter, unspecified: Secondary | ICD-10-CM

## 2024-08-29 MED ORDER — ALBUTEROL SULFATE HFA 108 (90 BASE) MCG/ACT IN AERS: 2.0000 | INHALATION_SPRAY | Freq: Four times a day (QID) | RESPIRATORY_TRACT | 0 refills | Status: AC | PRN

## 2024-08-29 MED ORDER — CYCLOBENZAPRINE HCL 10 MG PO TABS: 10.0000 mg | ORAL_TABLET | Freq: Two times a day (BID) | ORAL | 5 refills | Status: AC

## 2024-08-29 NOTE — Progress Notes (Signed)
 Established patient visit   History of Present Illness   Discussed the use of AI scribe software for clinical note transcription with the patient, who gave verbal consent to proceed.  History of Present Illness   Krista [name] is a 73 year old female with COPD who presents with anemia and a suspicious lung nodule.  Anemia - Hemoglobin is 10.8 g/dL - No gastrointestinal bleeding - No black tarry stools - No significant aspirin  or NSAID use - History of B12 deficiency requiring injections 30-40 years ago - On colchicine for three months - No recent injury or surgery  Pulmonary nodule - Lung scan shows a new 10.3 mm nodule in the left upper lobe - Family history of lung cancer in father and two brothers - Quit smoking 10-15 years ago  Chronic obstructive pulmonary disease (copd) and respiratory symptoms - Labored breathing - Cough, sometimes productive of yellow-green sputum, especially at night - No fever or chills - Occasional shortness of breath at rest and with activity - Low energy - No regular use of albuterol  inhalers  Urinary symptoms and renal function - Difficulty urinating, described as a 'trickle' - Reduced kidney function with GFR 59 - Creatinine and BUN not at critical levels  Gastrointestinal and upper respiratory symptoms - Indigestion, particularly at night, associated with cough - Occasional use of Benadryl  for allergies, which helps with mucus in throat - No significant sinus drainage - Cough productive of yellow-green sputum  Current medications - Flexeril , one tablet around noon and one at night - Occasional Benadryl  for allergies - No regular antihistamine use       Physical Exam   Physical Exam Vitals reviewed.  Constitutional:      General: She is not in acute distress.    Appearance: Normal appearance.  HENT:     Head: Normocephalic and atraumatic.  Cardiovascular:     Rate and Rhythm: Normal rate and regular rhythm.      Pulses: Normal pulses.     Heart sounds: Normal heart sounds. No murmur heard.    No friction rub. No gallop.  Pulmonary:     Effort: Pulmonary effort is normal. No respiratory distress.     Breath sounds: Normal breath sounds. No wheezing.  Skin:    General: Skin is warm and dry.  Neurological:     Mental Status: She is alert and oriented to person, place, and time.  Psychiatric:        Mood and Affect: Mood normal.        Behavior: Behavior normal.        Thought Content: Thought content normal.        Judgment: Judgment normal.    Assessment & Plan   Problem List Items Addressed This Visit   None Visit Diagnoses       Anemia, unspecified type    -  Primary   Relevant Orders   Fe+TIBC+Fer   B12 and Folate Panel   Reticulocytes   Sed Rate (ESR)   C-reactive protein     Vitamin B12 deficiency         Shortness of breath       Relevant Orders   DG Chest 2 View     Kidney function abnormal         Seasonal allergic rhinitis due to pollen         COPD with acute exacerbation (HCC)       Relevant Medications   albuterol  (  VENTOLIN  HFA) 108 (90 Base) MCG/ACT inhaler     Indigestion         Pulmonary nodule         Need for influenza vaccination       Relevant Orders   Flu vaccine HIGH DOSE PF(Fluzone Trivalent) (Completed)      Assessment and Plan    Chronic obstructive pulmonary disease (COPD) with acute exacerbation and cough COPD exacerbation with cough, shortness of breath, and yellow-green sputum. No fever or wheezing. Possible allergy and indigestion contribution. - Prescribed albuterol  inhaler, 2 puffs every 6 hours as needed. - Ordered chest x-ray to rule out pneumonia. - Start regular antihistamine such as Zyrtec, Allegra, Claritin, or Xyzal. - Prescribed Pepcid  for indigestion, to be taken twice daily as needed.  Allergic rhinitis Allergic rhinitis with mucus production and throat irritation, contributing to cough and COPD exacerbation. - Start regular  antihistamine such as Zyrtec, Allegra, Claritin, or Xyzal.  Indigestion (dyspepsia) Indigestion primarily at night, possibly related to reflux and contributing to cough. - Prescribed Pepcid , to be taken twice daily as needed, especially at night.  Pulmonary nodule, left upper lobe, under surveillance 10.3 mm nodule in left upper lobe, suspicious for infectious or inflammatory etiology. Family history of lung cancer. - Continue surveillance with repeat imaging in three months as scheduled.  Anemia, unspecified Mild normocytic anemia with hemoglobin at 10.8, slightly improved. Possible B12 deficiency due to past history of B12 shots. - Order blood tests for vitamin B12, folate, and iron levels. - Order stool cards to check for occult blood in stool. - Evaluate reticulocyte count to assess bone marrow response.  Kidney function reduced GFR slightly below normal, creatinine and BUN well-managed. No acute kidney issues.  - Stay well hydrated. - Avoid NSAIDs.    Follow up   Return if symptoms worsen or fail to improve. __________________________________ Zada FREDRIK Palin, DNP, APRN, FNP-BC Primary [facility] and Sports Medicine [facility] [place]

## 2024-08-30 ENCOUNTER — Ambulatory Visit: Payer: Self-pay | Admitting: Medical-Surgical

## 2024-08-30 LAB — B12 AND FOLATE PANEL
Folate: 17.5 ng/mL (ref >3.0)
Vitamin B-12: 953 pg/mL (ref 232–1245)

## 2024-08-30 LAB — SEDIMENTATION RATE: Sed Rate: 9 mm/h (ref 0–40)

## 2024-08-30 LAB — RETICULOCYTES: Retic Ct Pct: 1.3 % (ref 0.6–2.6)

## 2024-08-30 LAB — IRON,TIBC AND FERRITIN PANEL
Ferritin: 34 ng/mL (ref 15–150)
Iron Saturation: 27 % (ref 15–55)
Iron: 100 ug/dL (ref 27–139)
Total Iron Binding Capacity: 377 ug/dL (ref 250–450)
UIBC: 277 ug/dL (ref 118–369)

## 2024-08-30 LAB — C-REACTIVE PROTEIN: CRP: <1 mg/L (ref 0–10)

## 2024-09-10 ENCOUNTER — Other Ambulatory Visit (INDEPENDENT_AMBULATORY_CARE_PROVIDER_SITE_OTHER): Payer: Self-pay

## 2024-09-10 DIAGNOSIS — D649 Anemia, unspecified: Secondary | ICD-10-CM

## 2024-09-10 LAB — POC HEMOCCULT BLD/STL (HOME/3-CARD/SCREEN)
Card #2 Fecal Occult Blod, POC: NEGATIVE
Card #3 Fecal Occult Blood, POC: NEGATIVE
Fecal Occult Blood, POC: NEGATIVE

## 2024-09-10 NOTE — Addendum Note (Signed)
 Addended by: FANNY NIELS CROME on: 09/10/2024 04:47 PM   Modules accepted: Orders

## 2024-10-01 ENCOUNTER — Encounter: Payer: Self-pay | Admitting: Medical-Surgical

## 2024-10-01 ENCOUNTER — Ambulatory Visit

## 2024-10-01 ENCOUNTER — Ambulatory Visit: Admitting: Medical-Surgical

## 2024-10-01 VITALS — BP 92/60 | HR 112 | Temp 98.7°F | Resp 20 | Ht 65.0 in | Wt 123.1 lb

## 2024-10-01 DIAGNOSIS — I10 Essential (primary) hypertension: Secondary | ICD-10-CM | POA: Diagnosis not present

## 2024-10-01 DIAGNOSIS — R051 Acute cough: Secondary | ICD-10-CM

## 2024-10-01 DIAGNOSIS — R059 Cough, unspecified: Secondary | ICD-10-CM | POA: Diagnosis not present

## 2024-10-01 DIAGNOSIS — K120 Recurrent oral aphthae: Secondary | ICD-10-CM | POA: Diagnosis not present

## 2024-10-01 DIAGNOSIS — E782 Mixed hyperlipidemia: Secondary | ICD-10-CM

## 2024-10-01 MED ORDER — LIDOCAINE VISCOUS HCL 2 % MT SOLN: OROMUCOSAL | 0 refills | Status: AC

## 2024-10-01 MED ORDER — GABAPENTIN 300 MG PO CAPS: 300.0000 mg | ORAL_CAPSULE | Freq: Three times a day (TID) | ORAL | Status: DC | PRN

## 2024-10-01 MED ORDER — AIRSUPRA 90-80 MCG/ACT IN AERO: 2.0000 | INHALATION_SPRAY | Freq: Four times a day (QID) | RESPIRATORY_TRACT | 11 refills | Status: DC | PRN

## 2024-10-01 MED ORDER — METHYLPREDNISOLONE 4 MG PO TBPK: ORAL_TABLET | ORAL | 0 refills | Status: DC

## 2024-10-01 MED ORDER — BENZONATATE 200 MG PO CAPS: 200.0000 mg | ORAL_CAPSULE | Freq: Three times a day (TID) | ORAL | 0 refills | Status: DC | PRN

## 2024-10-01 MED ORDER — VALSARTAN 160 MG PO TABS: 160.0000 mg | ORAL_TABLET | Freq: Every day | ORAL | 3 refills | Status: AC

## 2024-10-01 NOTE — Progress Notes (Signed)
 Established Patient Office Visit  Subjective   Patient ID: Krista [name], female    DOB: February 16, 1951  Age: 73 y.o. MRN: [id number]  Chief Complaint  Patient presents with   Hypertension   Hyperlipidemia   Cough    Croupy Cough that started Saturday    HPI  Krista [name] a 73 year old female presents to the office for follow up on the following chronic diseases:  Essential Hypertension:  Patient is currently being managed an treated with the following Diovan  160-12.5 mg nightly.  Reports taking her mediations as prescribed. She does not routinely check her blood pressures at home. Patient reports having some dizziness daily and heart palpitations with increased activity. Denies chest pain or shortness of breath.  Mixed Hyperlipidemia  She is currently being managed and treated with atorvastatin  40 mg daily. She reports that she is taking her medications as prescribed. Denies having any side effects to her medications or myalgias. Previous Lipid Panel was 04/01/24 and was within normal limits.   Cough Patient reports cough that started on Saturday with thick mucus sometimes having a yellow tinge to it. Reports some chest and throat discomfort with cough. Denies any known sick contacts. She has been taking mucinex  and delsym twice per day with no relief. She has also tried drinking warm tea with honey with some relief. Cough interferes with sleep. She also reports some having some mouth ulcers that popped up about 3 days ago. Denies fever or chills.    Review of Systems  Constitutional: Negative.  Negative for chills and fever.  HENT:  Positive for sore throat.   Eyes: Negative.   Respiratory:  Positive for cough and sputum production.   Cardiovascular: Negative.   Gastrointestinal: Negative.   Genitourinary: Negative.   Musculoskeletal: Negative.   Skin: Negative.   Neurological:  Positive for dizziness.  Endo/Heme/Allergies: Negative.   Psychiatric/Behavioral: Negative.         Objective:     There were no vitals taken for this visit. BP Readings from Last 3 Encounters:  10/01/24 92/60  08/29/24 108/67  07/05/24 128/72      Physical Exam Vitals and nursing note reviewed.  Constitutional:      General: She is not in acute distress.    Appearance: Normal appearance.  HENT:     Head: Normocephalic and atraumatic.     Right Ear: Tympanic membrane, ear canal and external ear normal.     Left Ear: Tympanic membrane, ear canal and external ear normal.     Nose: Nose normal.     Mouth/Throat:     Mouth: Mucous membranes are dry.     Tongue: Lesions present.     Pharynx: Oropharynx is clear.  Cardiovascular:     Rate and Rhythm: Normal rate and regular rhythm.     Pulses: Normal pulses.     Heart sounds: Normal heart sounds.  Pulmonary:     Effort: Pulmonary effort is normal.     Breath sounds: Normal breath sounds.  Neurological:     General: No focal deficit present.     Mental Status: She is alert and oriented to person, place, and time.  Psychiatric:        Mood and Affect: Mood normal.        Behavior: Behavior normal.      No results found for any visits on 10/01/24.  Last lipids Lab Results  Component Value Date   CHOL 165 04/01/2024   HDL 67 04/01/2024  LDLCALC 79 04/01/2024   TRIG 106 04/01/2024   CHOLHDL 2.5 04/01/2024      The 10-year ASCVD risk score (Arnett DK, et al., 2019) is: 12%    Assessment & Plan:   1. Essential hypertension (Primary) -Start Valsartan  160 mg daily. -Discontinue Diovan  due to hypotensive blood pressures - valsartan  (DIOVAN ) 160 MG tablet; Take 1 tablet (160 mg total) by mouth daily.  Dispense: 90 tablet; Refill: 3  2. Mixed hyperlipidemia -Continue with Atorvastatin  40 mg daily  3. Acute cough -Chest x-ray to rule out pneumonia -Will start methylprednisolone  for possible bronchitis - DG Chest 2 View; Future - benzonatate  (TESSALON ) 200 MG capsule; Take 1 capsule (200 mg total) by  mouth 3 (three) times daily as needed for cough.  Dispense: 45 capsule; Refill: 0 - methylPREDNISolone  (MEDROL  DOSEPAK) 4 MG TBPK tablet; Take as directed.  Dispense: 21 tablet; Refill: 0 - Albuterol -Budesonide  (AIRSUPRA) 90-80 MCG/ACT AERO; Inhale 2 puffs into the lungs every 6 (six) hours as needed.  Dispense: 1 g; Refill: 11  4. Canker sores oral - lidocaine  (XYLOCAINE ) 2 % solution; Use a clean cotton swab to apply a small amount of lidocaine  to canker sores ever 4 hours as needed.  Dispense: 100 mL; Refill: 0   Derrek JINNY Freund, NP Student

## 2024-10-01 NOTE — Progress Notes (Signed)
 Clarification: Hypertension-plan to discontinue valsartan -HCTZ and switch to plain valsartan  160 mg daily.  Monitor blood pressure at home with a goal of 130/80 or less.  Return in 2 weeks for nurse visit for blood pressure check on the new medication.  Acute cough-treating with Medrol  Dosepak as noted.  Switching from albuterol  inhaler to Airsupra.  Tessalon  Perles every 8 hours as needed for cough management.  Offered stronger cough syrup however she declined.  Okay to continue Delsym and Mucinex  if desired.  Medical screening examination/treatment was performed by qualified clinical staff member and as supervising provider I was immediately available for consultation/collaboration. I have reviewed documentation and agree with assessment and plan.  Krista FREDRIK Palin, DNP, APRN, FNP-BC [facility]

## 2024-10-02 ENCOUNTER — Ambulatory Visit: Payer: Self-pay | Admitting: Medical-Surgical

## 2024-10-07 ENCOUNTER — Ambulatory Visit (INDEPENDENT_AMBULATORY_CARE_PROVIDER_SITE_OTHER)

## 2024-10-07 DIAGNOSIS — R918 Other nonspecific abnormal finding of lung field: Secondary | ICD-10-CM | POA: Diagnosis not present

## 2024-10-07 DIAGNOSIS — Z87891 Personal history of nicotine dependence: Secondary | ICD-10-CM

## 2024-10-07 DIAGNOSIS — R911 Solitary pulmonary nodule: Secondary | ICD-10-CM

## 2024-10-07 DIAGNOSIS — I700 Atherosclerosis of aorta: Secondary | ICD-10-CM | POA: Diagnosis not present

## 2024-10-07 DIAGNOSIS — Z122 Encounter for screening for malignant neoplasm of respiratory organs: Secondary | ICD-10-CM | POA: Diagnosis not present

## 2024-10-07 DIAGNOSIS — J479 Bronchiectasis, uncomplicated: Secondary | ICD-10-CM | POA: Diagnosis not present

## 2024-10-07 DIAGNOSIS — J432 Centrilobular emphysema: Secondary | ICD-10-CM | POA: Diagnosis not present

## 2024-10-08 MED ORDER — DOXYCYCLINE HYCLATE 100 MG PO TABS: 100.0000 mg | ORAL_TABLET | Freq: Two times a day (BID) | ORAL | 0 refills | Status: AC

## 2024-10-14 ENCOUNTER — Telehealth: Payer: Self-pay | Admitting: Acute Care

## 2024-10-14 ENCOUNTER — Telehealth: Payer: Self-pay

## 2024-10-14 DIAGNOSIS — R911 Solitary pulmonary nodule: Secondary | ICD-10-CM

## 2024-10-14 NOTE — Telephone Encounter (Signed)
 Call Report from Tiffany  IMPRESSION: 1. Nodular consolidation in the medial apical left upper lobe has decreased slightly in size in the interval, suggesting an infectious/inflammatory lesion. Lung-RADS 4A, suspicious. Follow up low-dose chest CT without contrast in 3 months (please use the following order, CT CHEST LCS NODULE FOLLOW-UP W/O CM) is recommended. 2. Cylindrical bronchiectasis. 3. Aortic atherosclerosis (ICD10-I70.0). Coronary artery calcification. 4.  Emphysema (ICD10-J43.9).

## 2024-10-14 NOTE — Telephone Encounter (Signed)
 Please call patient and let her know the nodule is a bit smaller, and favors an infectious/ inflammatory cause. Plan is for a 3 month follow up scan. This will be due the beginning of Feb 2026. Please fax results to PCP and let them know plan of care. Thanks so much

## 2024-10-15 ENCOUNTER — Ambulatory Visit (INDEPENDENT_AMBULATORY_CARE_PROVIDER_SITE_OTHER)

## 2024-10-15 VITALS — BP 111/61 | HR 82 | Resp 20

## 2024-10-15 DIAGNOSIS — I700 Atherosclerosis of aorta: Secondary | ICD-10-CM | POA: Diagnosis not present

## 2024-10-15 DIAGNOSIS — I10 Essential (primary) hypertension: Secondary | ICD-10-CM | POA: Diagnosis not present

## 2024-10-15 DIAGNOSIS — J439 Emphysema, unspecified: Secondary | ICD-10-CM | POA: Diagnosis not present

## 2024-10-15 DIAGNOSIS — R0602 Shortness of breath: Secondary | ICD-10-CM

## 2024-10-15 DIAGNOSIS — R42 Dizziness and giddiness: Secondary | ICD-10-CM | POA: Diagnosis not present

## 2024-10-15 DIAGNOSIS — E876 Hypokalemia: Secondary | ICD-10-CM | POA: Diagnosis not present

## 2024-10-15 DIAGNOSIS — F410 Panic disorder [episodic paroxysmal anxiety] without agoraphobia: Secondary | ICD-10-CM | POA: Diagnosis not present

## 2024-10-15 DIAGNOSIS — I4581 Long QT syndrome: Secondary | ICD-10-CM | POA: Diagnosis not present

## 2024-10-15 DIAGNOSIS — R9431 Abnormal electrocardiogram [ECG] [EKG]: Secondary | ICD-10-CM | POA: Diagnosis not present

## 2024-10-15 DIAGNOSIS — R944 Abnormal results of kidney function studies: Secondary | ICD-10-CM | POA: Diagnosis not present

## 2024-10-15 DIAGNOSIS — Z87891 Personal history of nicotine dependence: Secondary | ICD-10-CM | POA: Diagnosis not present

## 2024-10-15 DIAGNOSIS — R059 Cough, unspecified: Secondary | ICD-10-CM | POA: Diagnosis not present

## 2024-10-15 DIAGNOSIS — J479 Bronchiectasis, uncomplicated: Secondary | ICD-10-CM | POA: Diagnosis not present

## 2024-10-15 DIAGNOSIS — R069 Unspecified abnormalities of breathing: Secondary | ICD-10-CM | POA: Diagnosis not present

## 2024-10-15 DIAGNOSIS — J441 Chronic obstructive pulmonary disease with (acute) exacerbation: Secondary | ICD-10-CM | POA: Diagnosis not present

## 2024-10-15 MED ORDER — IPRATROPIUM-ALBUTEROL 0.5-2.5 (3) MG/3ML IN SOLN: 3.0000 mL | Freq: Once | RESPIRATORY_TRACT | Status: DC

## 2024-10-15 MED ORDER — IPRATROPIUM-ALBUTEROL 0.5-2.5 (3) MG/3ML IN SOLN
3.0000 mL | Freq: Once | RESPIRATORY_TRACT | Status: AC
  Administered 2024-10-15: 3 mL via RESPIRATORY_TRACT

## 2024-10-15 NOTE — Addendum Note (Signed)
 Addended byBETHA DUWAINE RIGGS on: 10/15/2024 02:17 PM   Modules accepted: Orders

## 2024-10-15 NOTE — Telephone Encounter (Signed)
 Called patient to review results, no answer, LVM.

## 2024-10-15 NOTE — Progress Notes (Signed)
   Subjective:    Patient ID: Krista [name], female    DOB: September 04, 1951, 73 y.o.   MRN: [id number]  HPI Encounter for Hypertension last OV joy jessup, NP discontinued valsartan -HCTZ and switched to plain valsartan  160 mg daily last OV. Monitor blood pressure at home with a goal of 130/80 or less. Denies CP, palpitations, or vision changes. Pt does complain of SOB and tremors.  Review of Systems     Objective:           Assessment & Plan:   Patients first BP reading is. 111/61 and O2 89 Reported to PCP joy jessup as pt complained of SOB as well, pt given duo neb in office today and 2L of oxygen and was sent to the ER by Joy.

## 2024-10-15 NOTE — ED Notes (Addendum)
 Patient ambulated in hallway with this RN. Patient needed no assistance with ambulation, denies any dizziness or ShOB

## 2024-10-15 NOTE — ED Provider Notes (Signed)
 [facility]  ED Provider Note  Krista [name] 73 y.o. female DOB: October 07, 1951 MRN: [id number] History   Chief Complaint  Patient presents with  . Shortness of Breath    Pt BIB ems for [facility] getting worse today with dizziness. Pt has hx of COPD. Pt does not wear oxygen at home. Flu-like symptoms for the past week with cough.    Patient presents for an evaluation of shortness of breath.  She advises she has been ill with cough, congestion, and shortness of breath for 3 or 4 weeks.  She has been seen by her primary care provider and has had multiple tests.  She is unsure of her diagnosis.  She does have a history of COPD.  She is not currently smoking and has not smoked in 12 years.  She states she is dizzy today.  She is not dependent on oxygen at home.  She has not had any fever, chills, body aches, nausea, vomiting, diarrhea, chest pain, leg swelling, weakness, syncope, palpitations, or other associated symptoms.  States she went to her primary care provider today for a blood pressure check.  They saw that she was short of breath and sent her to urgent care.  Then the urgent care sent her here.   Shortness of Breath Associated symptoms: no abdominal pain, no chest pain, no ear pain, no rash, no sore throat and no vomiting        Past Medical History:  Diagnosis Date  . Aneurysm 2010  . Arthritis    Bilateral hands, bilateral knees, bilateral shoulder, bilateral elbows  . Bronchial pneumonia 09/2012   Hospitalized at [facility] for this  . Cancer (*) 2009   Skin (nose), removed   . Colon polyp   . H/O Bell's palsy   . Hyperlipidemia   . Osteoporosis   . PONV (postoperative nausea and vomiting)     Past Surgical History:  Procedure Laterality Date  . Brain surgery     Stent placement due to aneurysm  . Cataract extraction w/  intraocular lens implant Right 01/09/2013   Procedure: PHACO W/ IOL, RIGHT EYE;  Surgeon: Fairy Georgina Aschoff, MD;  Location:  [facility] OR;  Service: Ophthalmology;  Laterality: Right;  . Colonoscopy  09/2017  . Hysterectomy    . Rotator cuff repair Right   . Spinal ablation    . Tonsillectomy and adenoidectomy    . Upper gastrointestinal endoscopy  09/2020    Social History   Substance and Sexual Activity  Alcohol Use Yes  . Alcohol/week: 1.0 standard drink of alcohol  . Types: 1 Glasses of wine per week   Comment: Rarely    Tobacco Use History[1] E-Cigarettes  . Vaping Use    . Start Date    . Cartridges/Day    . Quit Date     Social History   Substance and Sexual Activity  Drug Use No   Tetanus up to date?: Yes Immunizations Up to Date?: Yes   Allergies[2]  Home Medications   ACETAMINOPHEN  (TYLENOL  ARTHRITIS,MAPAP) 650 MG CR TABLET    Take one tablet (650 mg dose) by mouth.   ALBUTEROL  SULFATE HFA (PROVENTIL ,VENTOLIN ,PROAIR ) 108 (90 BASE) MCG/ACT INHALER    INHALE 2 PUFFS INTO THE LUNGS EVERY 6 HOURS AS NEEDED FOR WHEEZE   ATORVASTATIN  (LIPITOR) 20 MG TABLET    Take one tablet (20 mg dose) by mouth daily.   CALCIUM  CARBONATE-VITAMIN D  (CALCIUM  CARBONATE-VITAMIN D ) 600-400 MG-UNIT PER TABLET    2 (  two) times daily.   CHOLECALCIFEROL 25 MCG (1000 UT) TABLET    Take one tablet (1,000 Units dose) by mouth daily.   CYCLOBENZAPRINE  (FLEXERIL ) 10 MG TABLET    as needed.   DICYCLOMINE (BENTYL) 20 MG TABLET    TAKE ONE TABLET (20 MG DOSE) BY MOUTH 2 (TWO) TIMES A DAY AS NEEDED.   FAMOTIDINE  (PEPCID ) 40 MG TABLET    Take one tablet (40 mg dose) by mouth daily.   GABAPENTIN  (NEURONTIN ) 300 MG CAPSULE    100 mg.   LUTEIN 10 MG TABS       MAGNESIUM  OXIDE 400 TABS    Take one tablet (400 mg dose) by mouth daily.   PRAVASTATIN  SODIUM (PRAVACHOL ) 20 MG TABLET    Take two tablets (40 mg dose) by mouth daily.   VALACYCLOVIR  (VALTREX ) 1000 MG TABLET    Take one tablet (1,000 mg dose) by mouth 2 (two) times daily.   VALSARTAN -HYDROCHLOROTHIAZIDE  (DIOVAN -HCT) 80-12.5 MG PER TABLET    Take one tablet by mouth  daily.    Primary Survey  Primary Survey  Review of Systems   Review of Systems  Constitutional:  Negative for chills and fever.  HENT:  Positive for congestion. Negative for ear pain and sore throat.   Eyes:  Negative for pain and visual disturbance.  Respiratory:  Positive for cough and shortness of breath.   Cardiovascular:  Negative for chest pain and palpitations.  Gastrointestinal:  Negative for abdominal pain and vomiting.  Genitourinary:  Negative for dysuria and hematuria.  Musculoskeletal:  Negative for arthralgias and back pain.  Skin:  Negative for color change and rash.  Neurological:  Positive for dizziness. Negative for seizures and syncope.  All other systems reviewed and are negative.   Physical Exam   ED Triage Vitals [10/15/24 1410]  BP (!) 106/92  Heart Rate 98  Resp 20  SpO2 100 %  Temp 97.8 F (36.6 C)    Physical Exam  Constitutional: She appears well-developed and well-nourished. She appears distressed (Panic attack) and does not appear ill. Not diaphoretic. HENT:  Head: Normocephalic and atraumatic.  Nose: No rhinorrhea. No right sinus tenderness present. No left sinus tenderness present.  Mouth/Throat: Mucous membranes are normal. Tonsils are 0 on the right. Tonsils are 0 on the left. No tonsillar exudate. Voice normal.  Eyes: Right eye: no drainage. Left eye: no drainage.  Neck: Normal range of motion and voice normal.  Cardiovascular: Normal rate, regular rhythm and normal heart sounds.  Pulmonary/Chest: No accessory muscle use. No apnea, no bradypnea and no agonal respiration. Tachypneic. Breath sounds normal. She exhibits no retraction.  Abdominal: Soft. There is no abdominal tenderness. Abdomen not distended. Bowel sounds are normal.  Musculoskeletal: Normal range of motion.     Cervical back: Normal range of motion.   Neurological: She is alert and oriented to person, place, and time. GCS Total Score = 15.  , eye opening = 4 , verbal  response = 5 , best motor response = 6 , pupil unreactive to light = 0   Patient has facial droop on the left that she and her family both state is chronic.  Skin: Skin is warm. Not diaphoretic. Skin is dry.  Psychiatric: Her speech is normal. Her behavior is normal. She appears anxious. Cognition and memory are normal.  Patient is extremely anxious and trembling.  She appears to be having a panic attack.  She is tearful at times.     ED Course  Lab results:   CBC AND DIFFERENTIAL - Abnormal      Result Value   WBC 8.5     RBC 3.63 (*)    HGB 11.3     HCT 32.6 (*)    MCV 89.8     MCH 31.1     MCHC 34.7     Plt Ct 260     RDW SD 42.2     MPV 8.7 (*)    NRBC% 0.0     Absolute NRBC Count 0.00     NEUTROPHIL % 62.4     LYMPHOCYTE % 25.3     MONOCYTE % 9.8     Eosinophil % 1.3     BASOPHIL % 0.8     IG% 0.4     ABSOLUTE NEUTROPHIL COUNT 5.32     ABSOLUTE LYMPHOCYTE COUNT 2.16     Absolute Monocyte Count 0.84 (*)    Absolute Eosinophil Count 0.11     Absolute Basophil Count 0.07     Absolute Immature Granulocyte Count 0.03    COMPREHENSIVE METABOLIC PANEL - Abnormal   Na 135 (*)    Potassium 3.3 (*)    Cl 99     CO2 19 (*)    AGAP 17 (*)    Glucose 99     BUN 23     Creatinine 1.14 (*)    Ca 10.2     ALK PHOS 102     T Bili 0.4     Total Protein 6.7     Alb 4.4     GLOBULIN 2.3     ALBUMIN/GLOBULIN RATIO 1.9     BUN/CREAT RATIO 20.2     ALT 19     AST 22     eGFR 51 (*)    Comment: Normal GFR (glomerular filtration rate) > 60 mL/min/1.73 meters squared, < 60 may include impaired kidney function. Calculation based on the Chronic Kidney Disease Epidemiology Collaboration (CK-EPI)equation refit without adjustment for race.  COVID-19, FLU A+B AND RSV - Normal   Flu A Negative     Flu B Negative     RSV PCR Negative     SARS-COV-2 Not Detected     Narrative:    SARS-COV-2 (COVID-19)PCR-Negative results do not preclude SARS-CoV-2 infection and should not be used  as the sole basis for patient management decisions. Negative results must be combined with clinical observations, patient history, and epidemiological information.  Flu and/or RSV - Negative results do not preclude the presence of Flu or RSV virus and should not be used as the sole basis for treatment or other patient management decisions. False negative results may occur if virus is present at levels below the analytical limit of detection.  This test detects Influenza A, Influenza B, and Respiratory Syncytial Virus and SARS-COV-2 (COVID-19) by PCR.     GEN5 CARDIAC TROPONIN T (TNT5) BASELINE - Normal   TnT-Gen5 (0hr) 8     Comment: An elevated Troponin indicates myocardial damage. Elevated troponin may also be due to pulmonary emboli, aortic dissection, heart failure, trauma, toxins and ischemia in the setting of critical illness.   GEN5 CARDIAC TROPONIN T(TNT5) 1 HOUR  LIGHT BLUE TOP  GOLD SST    Imaging:   XR CHEST AP PORTABLE   Narrative:    TECHNIQUE: One AP view of the chest  COMPARISON: None.  INDICATION: Tachypnea and shortness of breath.  FINDINGS: No abnormal pulmonary opacities. Emphysematous change. Cardiac and mediastinal contours are unremarkable. No  pleural effusion or pneumothorax. Surgical anchor is identified in the right greater tuberosity. Partially imaged fusion hardware in the lumbar spine.    Impression:    IMPRESSION: No acute cardiopulmonary abnormality.  Electronically Signed by: Nadara Fret, MD on 10/15/2024 3:09 PM     ECG: ECG Results          ECG 12 lead (In process)      Collection Time Result Time Acquisition Device Ventricular Rate Atrial Rate P-R Interval QRS Duration Q-T Interval QTC Calculation(Bazett) Calculated P Axis Calculated R Axis Calculated T Axis   10/15/24 16:11:33 10/15/24 16:15:22 D3K 95 95 180 72 392 492 76 52 72         Collection Time Result Time ECGDiag   10/15/24 16:11:33 10/15/24 16:15:22 Normal sinus  rhythm Prolonged QT Abnormal ECG When compared with ECG of 15-Oct-2024 14:59, No significant change was found                         ECG 12 lead (In process)      Collection Time Result Time Acquisition Device Systolic BP Diastolic BP Ventricular Rate Atrial Rate P-R Interval QRS Duration Q-T Interval QTC Calculation(Bazett) Calculated P Axis   10/15/24 14:59:48 10/15/24 15:38:50 D3K 161 75 80 80 182 72 434 500 68         Collection Time Result Time Calculated R Axis Calculated T Axis ECGDiag   10/15/24 14:59:48 10/15/24 15:38:50 59 66 Normal sinus rhythm Prolonged QT Abnormal ECG When compared with ECG of 15-Oct-2024 14:17, No significant change was found                         ECG 12 lead (In process)      Collection Time Result Time Acquisition Device Ventricular Rate Atrial Rate P-R Interval QRS Duration Q-T Interval QTC Calculation(Bazett) Calculated P Axis Calculated R Axis Calculated T Axis   10/15/24 14:17:40 10/15/24 15:38:32 D3K 98 98 170 70 386 492 82 59 67         Collection Time Result Time ECGDiag   10/15/24 14:17:40 10/15/24 15:38:32 Normal sinus rhythm Prolonged QT Abnormal ECG No previous ECGs available                            HEAR Score History: Mostly low risk features ECG: Normal Age: 25 or greater Risk Factors: 1 or 2 risk factors HEAR Score Total: 3                                                           Pre-Sedation Procedures  ED Course as of 10/15/24 1714  Abigail Rooks's Documentation  Tue Oct 15, 2024  1621 Patient reassessed.  She is no longer hyperventilating or shaking.  She states she feels significantly better.  She denies any current shortness of breath.  She was on oxygen when I entered the room.  She does not need oxygen.  This was removed.  Oxygen saturation remains 100%.  Discussed with patient plan for discharge.  She states that someone from her  pulmonology office tried to call her this morning to discuss her CT report from a couple weeks ago.  I encouraged her to call them back  now.  She declines to do so and states she will do that later.  She does advise that, since the CT, she has completed a Medrol  Dosepak and a course of doxycycline.  1714 Signed out patient to Graeme Patience, PA-C   Medical Decision Making Patient presented for an evaluation of shortness of breath.  She has been ill for a few weeks.  She has already been treated with antibiotics and steroids.  Her symptoms had significantly improved but she went to the doctor today for a blood pressure check and she began feeling anxious.  She appears to have had a panic attack.  When she arrived here today she was very short of breath, trembling, and crying.  She was given Ativan and her symptoms all resolved.  She had no further shortness of breath.  Her oxygen saturation has maintained at 100% throughout her stay.  She is overall well-appearing.  COVID, flu, and RSV tests are negative.  CBC is unremarkable.  CMP shows mildly decreased potassium.  This is likely due to hyperventilation when the blood was drawn.  There is no significant hypokalemia to suggest that she would need replacement at this time.  Her creatinine is elevated at 1.14.  This is unchanged from her baseline.  Chest x-ray is clear.  Hear score is 3.  EKG does show prolonged QT.  This is unlikely to be the cause of her symptoms.  She will be referred back to her cardiologist for further evaluation of same.  I have reviewed extensive notes and testing from outside facility.  Patient had a CT scan of her chest 8 days ago that shows a nodular consolidation in the medial aspect of the left upper lobe that has decreased in size.  It is thought to be infectious or inflammatory and the plan is to repeat her CT in 3 months.  She also has cylindrical bronchiectasis, aortic atherosclerosis, and emphysema.  She missed a phone call  today from her pulmonology office.  I anticipate they were going to speak to her about any plans they have for treatment.  She has been highly encouraged to call them back and to follow-up closely with them regarding any shortness of breath.  Delta troponin was minimally positive.  We are currently waiting on third troponin to determine disposition.  Amount and/or Complexity of Data Reviewed External Data Reviewed: radiology, ECG and notes.    Details: Ct from [facility] dated 10/07/2024 1. Nodular consolidation in the medial apical left upper lobe has  decreased slightly in size in the interval, suggesting an  infectious/inflammatory lesion. Lung-RADS 4A, suspicious. Follow up  low-dose chest CT without contrast in 3 months (please use the  following order, CT CHEST LCS NODULE FOLLOW-UP W/O CM) is  recommended.  2. Cylindrical bronchiectasis.  3. Aortic atherosclerosis (ICD10-I70.0). Coronary artery  calcification.  4. Emphysema (ICD10-J43.9).   Notes from pulmonology indicate that this is an infectious or inflammatory process.  They plan to rescan in February.  However, there is no indication in the notes that I have reviewed that patient was given antibiotics for infection.  I have reviewed prior EKG.  Her QTc in June was 456. Labs: ordered.    Details: CBC ordered to assess for leukocytosis, leukopenia, anemia, and thrombocytopenia. CMP ordered to assess for electrolyte imbalance, dehydration, hyperglycemia, hypoglycemia, acid-base imbalance, renal impairment, and hepatic impairment. Troponin and delta troponin was ordered to assess for cardiac ischemia. COVID/flu/RSV test ordered to assess for viral illness. Radiology: ordered.  Details: Chest x-ray ordered to assess for pneumonia, viral illness, cardiomyopathy, and congestive heart failure. ECG/medicine tests: ordered.    Details: EKG ordered to assess for ischemia, arrhythmia, and abnormal intervals.  Risk Prescription drug  management.          Provider Communication  New Prescriptions   No medications on file    Modified Medications   No medications on file    Discontinued Medications   No medications on file    Clinical Impression Final diagnoses:  Panic attack  Prolonged Q-T interval on ECG    ED Disposition     ED Disposition  Discharge   Condition  Stable   Comment  --                 Follow-up Information     Schedule an appointment as soon as possible for a visit  with Zada Palin, DNP.   Contact information: [address] [place] [address] [phone]         Schedule an appointment as soon as possible for a visit  with [facility].   Contact information: [address] [place]  [phone]        Schedule an appointment as soon as possible for a visit  with Jennifer Crape, MD.   Specialties: Cardiology, Cardiovascular Disease Contact information: [address] [place] [address] [phone]         [facility] Emergency Department.   Specialty: Emergency Medicine Comments: As needed Contact information: [address] [facility] [place]  [address] [phone] Additional information: When you arrive, our care team of nurses or providers will be ready to help you. Pre-registering, while not a scheduled appointment, helps us  prepare for your visit, ensuring a smoother and faster evaluation process.                 Electronically signed by:       [1] Social History Tobacco Use  Smoking Status Former  . Current packs/day: 0.50  . Average packs/day: 0.5 packs/day for 40.0 years (20.0 ttl pk-yrs)  . Types: Cigarettes  Smokeless Tobacco Never  Tobacco Comments   Quit smoking 6 months ago  [2] Allergies Allergen Reactions  . Other Other and Anaphylaxis    Nectarines--tongue swells  .  Adhesive [Contact Dermatitis] Hives and Redness  . Codeine Other    Hyperactivity  . Colestipol Swelling    Lips swollen/NO resp issues  . Kiwi Hives  . Plum Pulp Hives  . Prednisone  Other    Makes irritable  . Tomato Hives   Lavanda Franc, [place] 10/15/24 1714

## 2024-10-16 ENCOUNTER — Ambulatory Visit: Payer: Self-pay

## 2024-10-16 NOTE — Addendum Note (Signed)
 Addended by: RHETT KELLY POUR on: 10/16/2024 12:42 PM   Modules accepted: Orders

## 2024-10-16 NOTE — Telephone Encounter (Signed)
 See other phone note on 10/14/2024.

## 2024-10-16 NOTE — Telephone Encounter (Signed)
 Patient called in and left VM for LCS. Called patient back and left VM for pt to review results.

## 2024-10-16 NOTE — Telephone Encounter (Signed)
 Called and spoke with pt. We discussed her LDCT results and the possible infectious/inflammatory process. Patient is in agreement with 3 month follow up scan. Per pt's chart she went to ED on 10/15/2024 for COPD exacerbation. Patient states she was sent to ED after seeing her PCP yesterday. Patent has been scheduled for follow up scan on 01/07/2025. PCP notified.

## 2024-10-16 NOTE — Telephone Encounter (Signed)
 FYI Only or Action Required?: FYI only for provider: appointment scheduled on 10/17/24.  Patient was last seen in primary care on 10/01/2024 by Willo Mini, NP.  Called Nurse Triage reporting Panic Attack.  Symptoms began several weeks ago.  Interventions attempted: Nothing.  Symptoms are: stable.  Triage Disposition: See PCP Within 2 Weeks  Patient/caregiver understands and will follow disposition?: Yes Reason for Disposition  Nursing judgment or information in reference  Answer Assessment - Initial Assessment Questions Patient was seen in ED yesterday for difficulty breathing, resolved with anxiety medication, was not prescribed anxiety medication. Patient states she is calling to set up an appointment with PCP because that's what it says on her after visit summary.  1. REASON FOR CALL: What is your main concern right now?     Coughing lots of phlegm,  out of breath  2. ONSET: When did the symptoms start?     Over a month for the cough  3.. OTHER SYMPTOMS: Do you have any other new symptoms?     Denies new or worsening  Protocols used: No Guideline Available-A-AH  Copied from CRM #[id number]. Topic: Clinical - Red Word Triage >> Oct 16, 2024 12:13 PM Joesph NOVAK wrote: Red Word that prompted transfer to Nurse Triage: Patient went to the hospital yesterday. Short of breath, copd, panic attack. Patient is still experiencing symptoms.

## 2024-10-17 ENCOUNTER — Ambulatory Visit: Attending: Nurse Practitioner | Admitting: Nurse Practitioner

## 2024-10-17 ENCOUNTER — Ambulatory Visit (INDEPENDENT_AMBULATORY_CARE_PROVIDER_SITE_OTHER): Admitting: Medical-Surgical

## 2024-10-17 ENCOUNTER — Encounter: Payer: Self-pay | Admitting: Medical-Surgical

## 2024-10-17 ENCOUNTER — Encounter: Payer: Self-pay | Admitting: Nurse Practitioner

## 2024-10-17 VITALS — BP 126/75 | HR 92 | Resp 20 | Ht 65.0 in | Wt 121.1 lb

## 2024-10-17 VITALS — BP 126/74 | HR 88 | Ht 65.0 in | Wt 121.0 lb

## 2024-10-17 DIAGNOSIS — I340 Nonrheumatic mitral (valve) insufficiency: Secondary | ICD-10-CM

## 2024-10-17 DIAGNOSIS — J449 Chronic obstructive pulmonary disease, unspecified: Secondary | ICD-10-CM | POA: Diagnosis not present

## 2024-10-17 DIAGNOSIS — I2510 Atherosclerotic heart disease of native coronary artery without angina pectoris: Secondary | ICD-10-CM

## 2024-10-17 DIAGNOSIS — R051 Acute cough: Secondary | ICD-10-CM

## 2024-10-17 DIAGNOSIS — E785 Hyperlipidemia, unspecified: Secondary | ICD-10-CM

## 2024-10-17 DIAGNOSIS — I10 Essential (primary) hypertension: Secondary | ICD-10-CM

## 2024-10-17 DIAGNOSIS — R072 Precordial pain: Secondary | ICD-10-CM

## 2024-10-17 DIAGNOSIS — Z136 Encounter for screening for cardiovascular disorders: Secondary | ICD-10-CM

## 2024-10-17 DIAGNOSIS — R0602 Shortness of breath: Secondary | ICD-10-CM | POA: Diagnosis not present

## 2024-10-17 DIAGNOSIS — R7989 Other specified abnormal findings of blood chemistry: Secondary | ICD-10-CM

## 2024-10-17 MED ORDER — BENZONATATE 200 MG PO CAPS: 200.0000 mg | ORAL_CAPSULE | Freq: Three times a day (TID) | ORAL | 0 refills | Status: AC | PRN

## 2024-10-17 MED ORDER — PROMETHAZINE-DM 6.25-15 MG/5ML PO SYRP: 5.0000 mL | ORAL_SOLUTION | Freq: Four times a day (QID) | ORAL | 0 refills | Status: DC | PRN

## 2024-10-17 MED ORDER — METOPROLOL TARTRATE 100 MG PO TABS: ORAL_TABLET | ORAL | 0 refills | Status: DC

## 2024-10-17 NOTE — Patient Instructions (Signed)
 Medication Instructions:  Metoprolol Tartrate 100 mg take 1 tablet 2 hours prior to cardiac CT.  *If you need a refill on your cardiac medications before your next appointment, please call your pharmacy*  Lab Work: NONE ordered at this time of appointment   Testing/Procedures:   Your cardiac CT will be scheduled at one of the below locations:  [facility] [address]  [place], [place] [address]  If scheduled at the Heart and V[facility] Tower at [address], please enter the parking lot using the [address] entrance and use the FREE valet service at the patient drop-off area. Enter the building and check-in with registration on the main floor.  Please follow these instructions carefully (unless otherwise directed):  An IV will be required for this test and Nitroglycerin  will be given.  Hold all erectile dysfunction medications at least 3 days (72 hrs) prior to test. (Ie viagra, cialis, sildenafil, tadalafil, etc)   On the Night Before the Test: Be sure to Drink plenty of water. Do not consume any caffeinated/decaffeinated beverages or chocolate 12 hours prior to your test. Do not take any antihistamines 12 hours prior to your test.  On the Day of the Test: Drink plenty of water until 1 hour prior to the test. Do not eat any food 1 hour prior to test. You may take your regular medications prior to the test.  Take metoprolol (Lopressor) two hours prior to test. If you take Furosemide/Hydrochlorothiazide /Spironolactone/Chlorthalidone, please HOLD on the morning of the test. Patients who wear a continuous glucose monitor MUST remove the device prior to scanning. FEMALES- please wear underwire-free bra if available, avoid dresses & tight clothing      After the Test: Drink plenty of water. After receiving IV contrast, you may experience a mild flushed feeling. This is normal. On occasion, you may experience a mild rash up to 24 hours after the  test. This is not dangerous. If this occurs, you can take Benadryl  25 mg, Zyrtec, Claritin, or Allegra and increase your fluid intake. (Patients taking Tikosyn should avoid Benadryl , and may take Zyrtec, Claritin, or Allegra) If you experience trouble breathing, this can be serious. If it is severe call 911 IMMEDIATELY. If it is mild, please call our office.  We will call to schedule your test 2-4 weeks out understanding that some insurance companies will need an authorization prior to the service being performed.   For more information and frequently asked questions, please visit our website : [email or web address]  For non-scheduling related questions, please contact the cardiac imaging nurse navigator should you have any questions/concerns: Cardiac Imaging Nurse Navigators Direct Office Dial: [phone]   For scheduling needs, including cancellations and rescheduling, please call Brittany, [phone].   Follow-Up: At [facility], you and your health needs are our priority.  As part of our continuing mission to provide you with exceptional heart care, our providers are all part of one team.  This team includes your primary Cardiologist (physician) and Advanced Practice Providers or APPs (Physician Assistants and Nurse Practitioners) who all work together to provide you with the care you need, when you need it.  Your next appointment:    6-8 weeks ([place] if available)   Provider:   Jennifer Crape, MD    We recommend signing up for the patient portal called MyChart.  Sign up information is provided on this After Visit Summary.  MyChart is used to connect with patients for Virtual Visits (Telemedicine).  Patients  are able to view lab/test results, encounter notes, upcoming appointments, etc.  Non-urgent messages can be sent to your provider as well.   To learn more about what you can do with MyChart, go to [email or web address].

## 2024-10-17 NOTE — Progress Notes (Signed)
        Established patient visit   History of Present Illness   Discussed the use of AI scribe software for clinical note transcription with the patient, who gave verbal consent to proceed.  History of Present Illness   Krista [name] is a 73 year old female with COPD who presents for hospital discharge follow up after presenting to our office with persistent cough, SOB, tachypnea, oxygen desaturation, and severe anxiety.  Cough and sputum production - Persistent cough with sensation of 'mucus balls' in the throat - No relief with Delsym - Unable to take codeine for cough suppression - Considering alternative options for cough relief  Dyspnea - Ongoing shortness of breath - Symptoms unrelieved by antibiotics and prednisone  - Difficulty sleeping due to respiratory symptoms  Copd exacerbation - Recent exacerbation diagnosed during her hospitalization - Also diagnosed with a panic attack in the ED and treated with Ativan - Currently on antibiotics and prednisone  for management  Medication side effects - Doxycycline causes stomach pain - Prednisone  causes irritability  Immunization status - Received influenza vaccine - Has not received COVID-19 vaccine  Cardiovascular status - Blood pressure was 126/74 at recent cardiology appointment - Currently taking valsartan  160mg  with good control of BP    Physical Exam   Physical Exam Vitals reviewed.  Constitutional:      General: She is not in acute distress.    Appearance: Normal appearance. She is not ill-appearing.  HENT:     Head: Normocephalic and atraumatic.  Cardiovascular:     Rate and Rhythm: Normal rate and regular rhythm.     Pulses: Normal pulses.     Heart sounds: Normal heart sounds. No murmur heard.    No friction rub. No gallop.  Pulmonary:     Effort: Pulmonary effort is normal. No respiratory distress.     Breath sounds: Normal breath sounds. No wheezing.  Skin:    General: Skin is warm and dry.   Neurological:     Mental Status: She is alert and oriented to person, place, and time.  Psychiatric:        Mood and Affect: Mood normal.        Behavior: Behavior normal.        Thought Content: Thought content normal.        Judgment: Judgment normal.    Assessment & Plan     COPD exacerbation with acute cough and doxycycline intolerance COPD exacerbation with persistent cough and doxycycline intolerance causing gastrointestinal discomfort. Considered non-bacterial causes if symptoms persist. - Continue Augmentin  and steroids as prescribed. - Discuss non-bacterial causes with pulmonology at her appointment next week. - Prescribed promethazine  DM to aid with cough and sleep. - Refilling Tessalon  perls for daytime use.  Essential hypertension Prior hypotension led to adjustment of medication. Today, blood pressure is stable. Recent cardiology visit showed 126/74 mmHg. Valsartan  160mg  daily effective. - Rechecked blood pressure- 126/75. - Checking potassium levels. - Continue Valsartan  as prescribed.  - Continue home monitoring with a goal of 130/80 or less.    Follow up   Return if symptoms worsen or fail to improve. __________________________________ Zada FREDRIK Palin, DNP, APRN, FNP-BC Primary Care and Sports Medicine [facility]

## 2024-10-17 NOTE — Progress Notes (Signed)
 Office Visit    Patient Name: Krista [name] Date of Encounter: 10/17/2024  Primary Care Provider:  Willo Mini, NP Primary Cardiologist:  Jennifer JONELLE Crape, MD  Chief Complaint    73 year old female with a history of coronary artery calcification, mild mitral valve regurgitation, hypertension, hyperlipidemia, COPD, and GERD, who presents for follow-up related to shortness of breath.  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   H/O hysterectomy for benign disease 08/15/2017   H/O: Bell's palsy 1974   per pt during pregnancy , left side paralysis, told possible bell's palsy or stroke, after testing done had left mastoidectomy;  residual left side facialy asymmetry   History of basal cell carcinoma (BCC) excision    History of Bell's palsy 08/15/2017   History of cerebral aneurysm 2010   s/p coiling  ;  per pt was an incidental finding on imaging   History of Clostridioides difficile infection 2010   treated   History of colon polyps 08/15/2017   History of COVID-19 05/08/2021   positive result in care everywhere; per pt mild to moderate symptoms that resolved   History of Helicobacter pylori infection 2010   treated   History of left mastoidectomy 03/07/2018   History of nonmelanoma skin cancer 08/15/2017   History of osteoporosis 08/15/2017   Need records     Hyperlipidemia    Hypertension  05/12/1973   followed by pcp   Lymphocytic colitis    followed by dr c. lindaann (GI);  dx by biopsy 09-13-2017   Macular degeneration of both eyes    Non-intractable vomiting with nausea    followed by dr lindaann   OA (osteoarthritis)    Osteoporosis    Pneumonia    PONV (postoperative nausea and vomiting)    Spondylosis, unspecified    cervical and lumbar   Wears hearing aid in both ears    Past Surgical History:  Procedure Laterality Date   ANTERIOR LAT LUMBAR FUSION N/A 12/13/2022   Procedure: PRONE TRANSPSOAS INTERBODY FUSION, LUMBAR THREE-FOUR;  Surgeon: Lanis Pupa, MD;  Location: [facility] OR;  Service: Neurosurgery;  Laterality: N/A;  3C   CARPAL TUNNEL RELEASE Bilateral    early 2000s   CATARACT EXTRACTION W/ INTRAOCULAR LENS IMPLANT Bilateral 2014   CEREBRAL ANEURYSM REPAIR  2010   endocerebral coiling   COLONOSCOPY  09/13/2017   COSMETIC SURGERY  2023   Mohrs Surgery on nose   DISTAL INTERPHALANGEAL JOINT FUSION Left 09/16/2021   Procedure: left small finger distal interphalangeal joint fusion;  Surgeon: Alyse Agent, MD;  Location: [facility];  Service: Orthopedics;  Laterality: Left;  with MAC anesthesia   ESOPHAGOGASTRODUODENOSCOPY  09/22/2020   EYE SURGERY  2020   Blepharoplasty   FACIAL RECONSTRUCTION SURGERY  03/28/2022   [facility]   FOOT SURGERY Bilateral  eary 2000s;  bilateral bunionectomy  and removal bone right 5th toe   FRACTURE SURGERY  1968   Shattered right ankle   MASTOIDECTOMY Left 1974   RADIOFREQUENCY ABLATION  09/01/2021   lumbar , left side   ROTATOR CUFF REPAIR Right 2014   SALPINGOOPHORECTOMY Bilateral 1995   w/ excision endometriosis via laparotomy   SPINE SURGERY  2021/2022   DDD, osteoarthritis   TONSILLECTOMY AND ADENOIDECTOMY  1970   TOTAL ABDOMINAL HYSTERECTOMY  1987   TRIGGER FINGER RELEASE Bilateral    early 2000s; one finger each hand   TUBAL LIGATION  1982   WRIST GANGLION EXCISION Left    2000s     Allergies  Allergies  Allergen Reactions   Tomato Hives   Codeine Other (See Comments)    Hyperactivity wired super hyperactivity      Colestipol Swelling    Lips swollen/NO resp issues   Kiwi Extract Hives   Other Hives, Rash and Other (See Comments)    Nectarines--tongue swells    Plum Pulp Hives   Tape Hives and Rash   Prednisone  Other (See Comments)    Irritability       Labs/Other Studies Reviewed    The following studies were reviewed today:  Cardiac Studies & Procedures   ______________________________________________________________________________________________     ECHOCARDIOGRAM  ECHOCARDIOGRAM COMPLETE 07/02/2024  Narrative ECHOCARDIOGRAM REPORT    Patient Name:   Krista [name] Date of Exam: 07/02/2024 Medical Rec #:  [id number]     Height:       65.0 in Accession #:    [id number]    Weight:       118.0 lb Date of Birth:  24-Nov-1951     BSA:          1.581 m Patient Age:    72 years      BP:           120/68 mmHg Patient Gender: F             HR:           73 bpm. Exam Location:  [place]  Procedure: 2D Echo, 3D Echo, Cardiac Doppler, Color Doppler and Strain Analysis (Both Spectral and Color Flow Doppler were utilized during procedure).  Indications:    R01.1 Murmur  History:        Patient has no prior history of Echocardiogram examinations. CAD, Signs/Symptoms:Murmur; Risk Factors:Hypertension, Dyslipidemia and Former Smoker.  Sonographer:    Alan Greenhouse RDMS, RVT, RDCS Referring Phys: [id number] RAJAN R [facility]  IMPRESSIONS   1. Left ventricular ejection fraction, by estimation, is 60 to 65%. Left ventricular ejection fraction by 3D volume is 65 %. The left ventricle has normal function. The left ventricle has no regional wall motion abnormalities. Left ventricular diastolic parameters were normal. The average left ventricular global longitudinal strain is -23.4 %. The global longitudinal strain is normal. 2. Right ventricular  systolic function is normal. The right ventricular size is normal. 3. The mitral valve is normal in structure. Mild mitral valve regurgitation. No evidence of mitral stenosis. 4. The aortic valve is calcified. There is mild calcification of the aortic valve. There is mild thickening of the aortic valve. Aortic valve regurgitation is not visualized. Aortic valve sclerosis/calcification is present, without any evidence of aortic stenosis. 5. The inferior vena cava is normal in size with greater than 50% respiratory variability, suggesting right atrial pressure of 3 mmHg.  FINDINGS Left Ventricle: Left ventricular ejection fraction, by estimation, is 60  to 65%. Left ventricular ejection fraction by 3D volume is 65 %. The left ventricle has normal function. The left ventricle has no regional wall motion abnormalities. The average left ventricular global longitudinal strain is -23.4 %. Strain was performed and the global longitudinal strain is normal. The left ventricular internal cavity size was normal in size. There is no left ventricular hypertrophy. Left ventricular diastolic parameters were normal.  Right Ventricle: The right ventricular size is normal. No increase in right ventricular wall thickness. Right ventricular systolic function is normal.  Left Atrium: Left atrial size was normal in size.  Right Atrium: Right atrial size was normal in size.  Pericardium: There is no evidence of pericardial effusion.  Mitral Valve: The mitral valve is normal in structure. Mild mitral valve regurgitation. No evidence of mitral valve stenosis.  Tricuspid Valve: The tricuspid valve is normal in structure. Tricuspid valve regurgitation is trivial. No evidence of tricuspid stenosis.  Aortic Valve: The aortic valve is calcified. There is mild calcification of the aortic valve. There is mild thickening of the aortic valve. Aortic valve regurgitation is not visualized. Aortic valve sclerosis/calcification is  present, without any evidence of aortic stenosis. Aortic valve mean gradient measures 6.0 mmHg. Aortic valve peak gradient measures 9.7 mmHg. Aortic valve area, by VTI measures 1.09 cm.  Pulmonic Valve: The pulmonic valve was normal in structure. Pulmonic valve regurgitation is not visualized. No evidence of pulmonic stenosis.  Aorta: The aortic root is normal in size and structure.  Venous: The inferior vena cava is normal in size with greater than 50% respiratory variability, suggesting right atrial pressure of 3 mmHg.  IAS/Shunts: No atrial level shunt detected by color flow Doppler.  Additional Comments: 3D was performed not requiring image post processing on an independent workstation and was normal.   LEFT VENTRICLE PLAX 2D LVIDd:         3.00 cm         Diastology LVIDs:         1.50 cm         LV e' medial:    8.92 cm/s LV PW:         1.00 cm         LV E/e' medial:  8.9 LV IVS:        0.90 cm         LV e' lateral:   9.46 cm/s LVOT diam:     1.65 cm         LV E/e' lateral: 8.4 LV SV:         41 LV SV Index:   26              2D Longitudinal LVOT Area:     2.14 cm        Strain 2D Strain GLS   -22.4 % (A4C): LV Volumes (MOD)               2D Strain GLS   -24.8 % LV vol d, MOD    53.4 ml       (A3C): A2C:                           2D Strain GLS   -23.1 % LV vol d, MOD    50.7 ml       (A2C): A4C:  2D Strain GLS   -23.4 % LV vol s, MOD    20.6 ml       Avg: A2C: LV vol s, MOD    19.0 ml       3D Volume EF A4C:                           LV 3D EF:    Left LV SV MOD A2C:   32.8 ml                    ventricul LV SV MOD A4C:   50.7 ml                    ar ejection fraction by 3D volume is 65 %.  3D Volume EF: 3D EF:        65 % LV EDV:       87 ml LV ESV:       30 ml LV SV:        57 ml  RIGHT VENTRICLE RV S prime:     11.20 cm/s TAPSE (M-mode): 2.0 cm  LEFT ATRIUM             Index        RIGHT ATRIUM          Index LA diam:         1.90 cm 1.20 cm/m   RA Area:     9.96 cm LA Vol (A2C):   29.7 ml 18.79 ml/m  RA Volume:   20.80 ml 13.16 ml/m LA Vol (A4C):   20.3 ml 12.84 ml/m LA Biplane Vol: 25.9 ml 16.38 ml/m AORTIC VALVE AV Area (Vmax):    1.13 cm AV Area (Vmean):   1.09 cm AV Area (VTI):     1.09 cm AV Vmax:           156.00 cm/s AV Vmean:          119.000 cm/s AV VTI:            0.374 m AV Peak Grad:      9.7 mmHg AV Mean Grad:      6.0 mmHg LVOT Vmax:         82.20 cm/s LVOT Vmean:        60.400 cm/s LVOT VTI:          0.191 m LVOT/AV VTI ratio: 0.51  AORTA Ao Root diam: 2.85 cm Ao Asc diam:  3.00 cm  MITRAL VALVE               TRICUSPID VALVE MV Area (PHT): 3.50 cm    TR Peak grad:   17.8 mmHg MV Decel Time: 217 msec    TR Vmax:        211.00 cm/s MR Peak grad: 71.6 mmHg MR Vmax:      423.00 cm/s  SHUNTS MV E velocity: 79.70 cm/s  Systemic VTI:  0.19 m MV A velocity: 64.50 cm/s  Systemic Diam: 1.65 cm MV E/A ratio:  1.24  Lamar Fitch MD Electronically signed by Lamar Fitch MD Signature Date/Time: 07/02/2024/1:42:18 PM    Final          ______________________________________________________________________________________________     Recent Labs: 04/01/2024: Magnesium  2.0; TSH 0.897 08/20/2024: ALT 15; BUN 17; Creatinine 1.0; Hemoglobin 10.8; Platelets 249; Potassium 4.4; Sodium 135  Recent Lipid Panel    Component Value Date/Time   CHOL 165 04/01/2024  1404   TRIG 106 04/01/2024 1404   HDL 67 04/01/2024 1404   CHOLHDL 2.5 04/01/2024 1404   CHOLHDL 3.2 05/09/2023 1428   LDLCALC 79 04/01/2024 1404   LDLCALC 118 (H) 05/09/2023 1428    History of Present Illness    73 year old female with the above past medical history including coronary artery calcification, mild mitral valve regurgitation, hypertension, hyperlipidemia, COPD, and GERD.  She has a history of coronary artery calcification noted on prior CT.  She was referred to cardiology in the setting of chest  pain.  She was last seen in the office on 05/21/2024 and reported a 1 month history of substernal chest tightness at rest and in the setting of increased stress.  Echocardiogram in 06/2024 showed EF 60 to 65%, normal LV function, no RWMA, normal RV systolic function, mild mitral valve regurgitation, aortic valve sclerosis without evidence of aortic stenosis.  Coronary CT angiogram was ordered but not completed.  She saw her PCP on 10/15/2024 and was noted to have low oxygen saturation, SpO2 in the 80s.  She was referred to the ED.  She was seen in the ED on 10/15/2024 in the setting of shortness of breath.  EKG showed possible QT prolongation, otherwise unremarkable.  Labs revealed mild hypokalemia, this was supplemented.  Troponin was minimally elevated.  She was advised to follow-up with cardiology.  She was started on Augmentin  for treatment of bronchitis.  Valsartan -HCTZ was changed to valsartan  160 mg daily in the setting of hypotension.  She presents today for follow-up accompanied by her daughter.  Since her last visit and since her recent ED visit stable overall from a cardiac standpoint.  She reports ongoing shortness of breath with activity, intermittent chest discomfort, decreased activity tolerance, occasional dizziness with position changes. She denies any palpitations, presyncope or syncope.  She has noticed an increasingly elevated heart rate with minimal activity.  She is scheduled to see a pulmonologist next week.  She is pending repeat labs per PCP.  Home Medications    Current Outpatient Medications  Medication Sig Dispense Refill   albuterol  (VENTOLIN  HFA) 108 (90 Base) MCG/ACT inhaler Inhale 2 puffs into the lungs every 6 (six) hours as needed for wheezing or shortness of breath. 8 g 0   Albuterol -Budesonide  (AIRSUPRA) 90-80 MCG/ACT AERO Inhale 2 puffs into the lungs every 6 (six) hours as needed. 1 g 11   amoxicillin -clavulanate (AUGMENTIN ) 875-125 MG tablet Take 1 tablet by mouth 2  (two) times daily.     aspirin  EC 81 MG tablet Take 1 tablet (81 mg total) by mouth daily. Swallow whole.     atorvastatin  (LIPITOR) 40 MG tablet Take 1 tablet (40 mg total) by mouth daily. 90 tablet 3   benzonatate  (TESSALON ) 200 MG capsule Take 1 capsule (200 mg total) by mouth 3 (three) times daily as needed for cough. 45 capsule 0   cholecalciferol (VITAMIN D3) 25 MCG (1000 UNIT) tablet Take 1,000 Units by mouth every evening.     cyclobenzaprine  (FLEXERIL ) 10 MG tablet Take 1 tablet (10 mg total) by mouth 2 (two) times daily at 8 am and 10 pm. 120 tablet 5   gabapentin  (NEURONTIN ) 300 MG capsule Take 1 capsule (300 mg total) by mouth 3 (three) times daily as needed.     lidocaine  (XYLOCAINE ) 2 % solution Use a clean cotton swab to apply a small amount of lidocaine  to canker sores ever 4 hours as needed. 100 mL 0   meloxicam  (MOBIC ) 15 MG tablet  Take 15 mg by mouth daily as needed.     methylPREDNISolone  (MEDROL  DOSEPAK) 4 MG TBPK tablet Take as directed. 21 tablet 0   nitroGLYCERIN  (NITROSTAT ) 0.4 MG SL tablet Place 1 tablet (0.4 mg total) under the tongue every 5 (five) minutes as needed for chest pain. 25 tablet 2   valsartan  (DIOVAN ) 160 MG tablet Take 1 tablet (160 mg total) by mouth daily. 90 tablet 3   No current facility-administered medications for this visit.     Review of Systems    She denies palpitations, pnd, orthopnea, n, v, dizziness, syncope, edema, weight gain, or early satiety. All other systems reviewed and are otherwise negative except as noted above.   Physical Exam    VS:  BP 126/74 (BP Location: Left Arm, Patient Position: Sitting, Cuff Size: Normal)   Pulse 88   Ht 5' 5 (1.651 m)   Wt 121 lb (54.9 kg)   SpO2 (!) 88%   BMI 20.14 kg/m   GEN: Well nourished, well developed, in no acute distress. HEENT: normal. Neck: Supple, no JVD, carotid bruits, or masses. Cardiac: RRR, no murmurs, rubs, or gallops. No clubbing, cyanosis, edema.  Radials/DP/PT 2+ and equal  bilaterally.  Respiratory:  Respirations regular and unlabored, clear to auscultation bilaterally. GI: Soft, nontender, nondistended, BS + x 4. MS: no deformity or atrophy. Skin: warm and dry, no rash. Neuro:  Strength and sensation are intact. Psych: Normal affect.  Accessory Clinical Findings    ECG personally reviewed by me today - EKG Interpretation Date/Time:  Thursday October 17 2024 08:23:32 EST Ventricular Rate:  88 PR Interval:  184 QRS Duration:  72 QT Interval:  372 QTC Calculation: 450 R Axis:   73  Text Interpretation: Normal sinus rhythm Normal ECG When compared with ECG of 21-May-2024 10:35, Criteria for Septal infarct are no longer Present Confirmed by Daneen Perkins ([id number]) on 10/17/2024 8:42:55 AM  - no acute changes.   Lab Results  Component Value Date   WBC 53.0 08/20/2024   HGB 10.8 (A) 08/20/2024   HCT 34 (A) 08/20/2024   MCV 94 04/01/2024   PLT 249 08/20/2024   Lab Results  Component Value Date   CREATININE 1.0 08/20/2024   BUN 17 08/20/2024   NA 135 (A) 08/20/2024   K 4.4 08/20/2024   CL 97 (A) 08/20/2024   CO2 24 (A) 08/20/2024   Lab Results  Component Value Date   ALT 15 08/20/2024   AST 23 08/20/2024   ALKPHOS 92 08/20/2024   BILITOT 0.2 04/01/2024   Lab Results  Component Value Date   CHOL 165 04/01/2024   HDL 67 04/01/2024   LDLCALC 79 04/01/2024   TRIG 106 04/01/2024   CHOLHDL 2.5 04/01/2024    Lab Results  Component Value Date   HGBA1C 5.6 02/28/2023    Assessment & Plan    1. Coronary artery calcification/elevated troponin: Coronary artery calcification noted on prior CT. recently seen in the ED in the setting of hypoxia, chest pain, shortness of breath.  Troponin was minimally elevated with flat trend.  She was treated for bronchitis.  She reports ongoing shortness of breath with activity, intermittent chest discomfort, overall decreased activity tolerance.  Through shared decision making, and for further risk stratification,  will pursue coronary CT angiogram.  Recent BMET showed hypokalemia, otherwise stable.  Pending repeat labs per PCP.  Recommend also updating fasted lipid panel.  2. QT prolongation: QT prolongation noted on EKG during recent ED visit, stable on  today's EKG. Continue to monitor.   3. Mitral valve regurgitation: Echocardiogram in 06/2024 showed EF 60 to 65%, normal LV function, no RWMA, normal RV systolic function, mild mitral valve regurgitation, aortic valve sclerosis without evidence of aortic stenosis. She does report shortness of breath with activity, intermittent chest discomfort.  Euvolemic and well compensated on exam.  Pending coronary CT angiogram as above.  Consider repeat echocardiogram in 3 to 5 years, sooner if clinically indicated.  4. Hypertension: BP well controlled.  Recent hypotension, continue current antihypertensive regimen.   5. Hyperlipidemia: LDL was 79 in 03/2024.  Lipitor was subsequently increased.  She has not had repeat labs since.  Recommend repeat fasting lipid panel per PCP.  Consider escalation of lipid-lowering therapy pending labs, coronary CT angiogram results.  For now, continue Lipitor at current dose.  6. COPD: Ongoing shortness of breath as above.  Currently being treated for bronchitis.  She has follow-up scheduled with pulmonology next week.  7. Disposition:  Follow-up in 6 to 8 weeks, sooner if needed.        Damien JAYSON Braver, NP 10/17/2024, 8:46 AM

## 2024-10-18 ENCOUNTER — Ambulatory Visit: Payer: Self-pay | Admitting: Medical-Surgical

## 2024-10-18 LAB — BASIC METABOLIC PANEL WITH GFR
BUN/Creatinine Ratio: 18 (ref 12–28)
BUN: 20 mg/dL (ref 8–27)
CO2: 22 mmol/L (ref 20–29)
Calcium: 10.1 mg/dL (ref 8.7–10.3)
Chloride: 100 mmol/L (ref 96–106)
Creatinine, Ser: 1.11 mg/dL — ABNORMAL HIGH (ref 0.57–1.00)
Glucose: 96 mg/dL (ref 70–99)
Potassium: 4.4 mmol/L (ref 3.5–5.2)
Sodium: 134 mmol/L (ref 134–144)
eGFR: 52 mL/min/1.73 — ABNORMAL LOW (ref >59)

## 2024-10-20 ENCOUNTER — Encounter: Payer: Self-pay | Admitting: Nurse Practitioner

## 2024-10-22 ENCOUNTER — Ambulatory Visit (INDEPENDENT_AMBULATORY_CARE_PROVIDER_SITE_OTHER)

## 2024-10-22 ENCOUNTER — Ambulatory Visit

## 2024-10-22 VITALS — BP 114/70 | HR 104 | Temp 98.3°F | Ht 65.0 in | Wt 123.0 lb

## 2024-10-22 DIAGNOSIS — Z9889 Other specified postprocedural states: Secondary | ICD-10-CM | POA: Diagnosis not present

## 2024-10-22 DIAGNOSIS — J441 Chronic obstructive pulmonary disease with (acute) exacerbation: Secondary | ICD-10-CM

## 2024-10-22 DIAGNOSIS — R918 Other nonspecific abnormal finding of lung field: Secondary | ICD-10-CM | POA: Diagnosis not present

## 2024-10-22 DIAGNOSIS — R058 Other specified cough: Secondary | ICD-10-CM | POA: Diagnosis not present

## 2024-10-22 MED ORDER — IPRATROPIUM-ALBUTEROL 0.5-2.5 (3) MG/3ML IN SOLN: 3.0000 mL | RESPIRATORY_TRACT | 5 refills | Status: DC | PRN

## 2024-10-22 MED ORDER — BUDESONIDE 0.25 MG/2ML IN SUSP: 0.2500 mg | Freq: Every day | RESPIRATORY_TRACT | 12 refills | Status: AC

## 2024-10-22 MED ORDER — ARFORMOTEROL TARTRATE 15 MCG/2ML IN NEBU: 15.0000 ug | INHALATION_SOLUTION | Freq: Two times a day (BID) | RESPIRATORY_TRACT | 6 refills | Status: AC

## 2024-10-22 MED ORDER — BREZTRI AEROSPHERE 160-9-4.8 MCG/ACT IN AERO: 2.0000 | INHALATION_SPRAY | Freq: Two times a day (BID) | RESPIRATORY_TRACT | Status: DC

## 2024-10-22 MED ORDER — AEROCHAMBER PLUS FLO-VU SMALL MISC
1.0000 | Freq: Once | 0 refills | Status: AC
Start: 2024-10-22 — End: 2024-10-22

## 2024-10-22 NOTE — Patient Instructions (Signed)
  VISIT SUMMARY: During today's visit, we discussed your ongoing respiratory symptoms related to COPD and the challenges you have been facing with your inhaler due to facial palsy. We reviewed your recent treatments and the results of your lung cancer screening CT scan. We also addressed your current symptoms and made adjustments to your treatment plan to better manage your condition.  YOUR PLAN: -CHRONIC OBSTRUCTIVE PULMONARY DISEASE (COPD) WITH ACUTE EXACERBATION AND EMPHYSEMA: COPD is a chronic lung disease that makes it hard to breathe, and an exacerbation means your symptoms have suddenly worsened. Emphysema is a type of COPD that involves damage to the air sacs in your lungs. We have prescribed a new inhaler called Breztri and provided a sample for you to try. We also arranged for a nebulizer machine to be delivered to your home, which will help you take your medications more effectively. You should use the nebulizer treatments with long-acting medications and albuterol  as needed. A chest x-ray has been ordered to rule out pneumonia. Please monitor your oxygen levels, especially during physical activity, and avoid using prednisone  unless absolutely necessary.  -FACIAL PALSY STATUS POST FACIAL REANIMATION IMPACTING INHALER USE: Facial palsy is a condition that affects the muscles in your face, making it difficult to use your inhaler properly. To help with this, we have provided a nebulizer machine for home use and prescribed a spacer to use with your inhaler until the nebulizer arrives.  INSTRUCTIONS: Please follow up with a chest x-ray as ordered. Monitor your oxygen levels regularly, especially during physical activity. Use the nebulizer treatments as prescribed and continue using albuterol  as your rescue inhaler. If you have any questions or concerns, please contact our office.                     Contains text generated by [employer].

## 2024-10-22 NOTE — Progress Notes (Signed)
 Subjective:   PATIENT ID: Krista [name] GENDER: female DOB: Jun 22, 1951, MRN: [id number]   HPI Discussed the use of AI scribe software for clinical note transcription with the patient, who gave verbal consent to proceed.  History of Present Illness Krista [name] is a 73 year old female with presumed COPD who presents with persistent respiratory symptoms. She has a history of COPD and has been experiencing persistent respiratory symptoms despite recent treatment with 2 courses of antibiotics and 2 courses of steroids. She recently underwent a lung cancer screening CT scan which has not shown any pulmonary infiltrates on her lung parenchyma except a nodular consolidation in the medial apical left upper lobe which has decreased in size compared to a CT scan that was done in September.  Her symptoms began with a cough and phlegm on October 01, 2024, and she was prescribed a course of methylprednisolone  and antibiotics. Despite completing these treatments, her symptoms persisted, including shortness of breath and a productive cough with phlegm that changed from clear to yellow and then green, with occasional hemoptysis.  She experienced significant shortness of breath during a follow-up visit for hypotension, which led to an ER visit where she was treated with albuterol  and another course of steroids and antibiotics. She reports that her symptoms have remained the same or worsened since then.  Her current medications include albuterol  as a rescue inhaler, but she has not been using a maintenance inhaler due to cost and difficulty forming a seal with her mouth due to facial palsy. She has a history of pneumonia and chronic bronchitis since quitting smoking in 2012.  She reports xerostomia and dysphagia, which she attributes to her medications. No fever, chills, or night sweats. Her oxygen levels have been low, dropping into the 80s during a recent hospital visit.     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   H/O hysterectomy for benign disease 08/15/2017   H/O: Bell's palsy 1974   per pt during pregnancy , left side paralysis, told possible bell's palsy or stroke, after testing done had left mastoidectomy;  residual left side facialy asymmetry   History of basal cell carcinoma (BCC) excision    History of Bell's palsy 08/15/2017   History of cerebral aneurysm 2010   s/p coiling  ;  per pt was an incidental finding on imaging   History of Clostridioides difficile infection 2010   treated   History of colon polyps 08/15/2017   History of COVID-19 05/08/2021   positive result in care everywhere; per pt mild to moderate symptoms that resolved   History of Helicobacter pylori infection 2010   treated   History of left mastoidectomy 03/07/2018   History of nonmelanoma skin cancer 08/15/2017   History of osteoporosis 08/15/2017   Need records     Hyperlipidemia    Hypertension 05/12/1973   followed by  pcp   Lymphocytic colitis    followed by dr c. lindaann (GI);  dx by biopsy 09-13-2017   Macular degeneration of both eyes    Non-intractable vomiting with nausea    followed by dr lindaann   OA (osteoarthritis)    Osteoporosis    Pneumonia    PONV (postoperative nausea and vomiting)    Spondylosis, unspecified    cervical and lumbar   Wears hearing aid in both ears      Family History  Problem Relation Age of  Onset   Heart failure Mother    Arthritis Mother    Hearing loss Mother    Heart disease Mother    Hypertension Mother    Stroke Mother    Varicose Veins Mother    Cancer Father    Alcohol abuse Father    Early death Father    Hearing loss Father    Cancer Brother    Alcohol abuse Brother    ADD / ADHD Son    Alcohol abuse Son    Alcohol abuse Sister    Arthritis Sister    Alcohol abuse Brother    Cancer Brother    Alcohol abuse Daughter    Arthritis Sister    Cancer Sister    Heart disease Sister        Deceased   Arthritis Sister    Arthritis Brother    Hearing loss Brother    ADD / ADHD Son    Alcohol abuse Son    Cancer Brother    Vision loss Brother    Arthritis Sister    Cancer Sister    Heart disease Sister    Alcohol abuse Sister    Arthritis Sister    Vision loss Brother    Vision loss Brother    Alcohol abuse Brother    Alcohol abuse Daughter    Early death Sister      Social History   Socioeconomic History   Marital status: Married    Spouse name: Randal   Number of children: 2   Years of education: 14   Highest education level: Associate degree: academic program  Occupational History   Occupation: retired    Comment: control and instrumentation engineer  Tobacco Use   Smoking status: Former    Current packs/day: 0.00    Average packs/day: 0.8 packs/day for 52.5 years (42.0 ttl pk-yrs)    Types: Cigarettes    Start date: 10/06/1967    Quit date: 10/06/2011    Years since quitting: 13.0   Smokeless tobacco: Never   Tobacco comments:    Quit Date 10/06/2011  Vaping Use   Vaping status: Never Used  Substance and Sexual Activity   Alcohol use: Not Currently    Alcohol/week: 1.0 standard drink of alcohol    Comment: Rarely   Drug use: Never   Sexual activity: Not Currently    Birth control/protection: Abstinence, None  Other Topics Concern   Not on file  Social History Narrative   Lives with her husband. She enjoys playing with her grandson.   Social  Drivers of Corporate Investment Banker Strain: Low Risk  (09/30/2024)   Overall Financial Resource Strain (CARDIA)    Difficulty of Paying Living Expenses: Not very hard  Food Insecurity: No Food Insecurity (09/30/2024)   Hunger Vital Sign    Worried About Running Out of Food in the Last Year: Never true    Ran Out of Food in the Last Year: Never true  Transportation Needs: No Transportation Needs (09/30/2024)   PRAPARE - Administrator, Civil Service (Medical): No    Lack of Transportation (Non-Medical): No  Physical Activity: Insufficiently Active (09/30/2024)   Exercise Vital Sign    Days of Exercise per Week: 2 days    Minutes of Exercise per Session: 30 min  Stress: No Stress Concern Present (09/30/2024)   [employer] of Occupational Health - Occupational Stress Questionnaire    Feeling of Stress: Not at all  Social Connections: Socially Integrated (09/30/2024)   Social Connection and Isolation Panel    Frequency of Communication with Friends and Family: More than three times a week    Frequency of Social Gatherings with Friends and Family: Once a week    Attends Religious Services: More than 4 times per year    Active Member of [employer] or Organizations: Yes    Attends Engineer, Structural: More than 4 times per year    Marital Status: Married  Catering Manager Violence: Not At Risk (10/15/2024)   Received from [facility]   HITS    Over the last 12 months how often did your partner physically hurt you?: Never    Over the last 12 months how often did your partner insult you or talk down to you?: Never    Over the last 12 months how often did your partner threaten you with physical harm?: Never    Over the last 12 months how often did your partner scream or curse at you?: Never     Allergies  Allergen Reactions   Tomato Hives   Codeine Other (See Comments)    Hyperactivity wired super hyperactivity      Colestipol Swelling    Lips  swollen/NO resp issues   Kiwi Extract Hives   Other Hives, Rash and Other (See Comments)    Nectarines--tongue swells    Plum Pulp Hives   Tape Hives and Rash   Doxycycline Hyclate Other (See Comments)    Abdominal pain and diarrhea   Prednisone  Other (See Comments)    Irritability       Outpatient Medications Prior to Visit  Medication Sig Dispense Refill   albuterol  (VENTOLIN  HFA) 108 (90 Base) MCG/ACT inhaler Inhale 2 puffs into the lungs every 6 (six) hours as needed for wheezing or shortness of breath. 8 g 0   aspirin  EC 81 MG tablet Take 1 tablet (81 mg total) by mouth daily. Swallow whole.     atorvastatin  (LIPITOR) 40 MG tablet Take 1 tablet (40 mg total) by mouth daily. 90 tablet 3   benzonatate  (TESSALON ) 200 MG capsule Take 1 capsule (200 mg total) by mouth 3 (three) times daily as needed for cough. 45 capsule 0   cholecalciferol (VITAMIN D3) 25 MCG (1000 UNIT) tablet Take 1,000 Units by mouth every evening.     cyclobenzaprine  (FLEXERIL ) 10 MG tablet Take 1 tablet (10 mg total) by mouth 2 (two) times daily at 8 am and 10 pm. 120 tablet 5   gabapentin  (NEURONTIN ) 300 MG capsule Take 1 capsule (300 mg total) by mouth 3 (three) times daily as needed.     lidocaine  (XYLOCAINE ) 2 % solution Use a clean cotton swab to apply a small amount of lidocaine  to canker sores ever 4 hours as needed. 100 mL 0   meloxicam  (MOBIC ) 15 MG tablet Take 15 mg by mouth daily as needed.     methylPREDNISolone  (MEDROL  DOSEPAK) 4 MG TBPK tablet Take as directed. 21  tablet 0   metoprolol tartrate (LOPRESSOR) 100 MG tablet Take 1 tablet 2 hours prior to cardiac CT 1 tablet 0   nitroGLYCERIN  (NITROSTAT ) 0.4 MG SL tablet Place 1 tablet (0.4 mg total) under the tongue every 5 (five) minutes as needed for chest pain. 25 tablet 2   promethazine -dextromethorphan (PROMETHAZINE -DM) 6.25-15 MG/5ML syrup Take 5 mLs by mouth 4 (four) times daily as needed. 240 mL 0   valsartan  (DIOVAN ) 160 MG tablet Take 1 tablet  (160 mg total) by mouth daily. 90 tablet 3   No facility-administered medications prior to visit.    ROS Reviewed all systems and reported negative except as above     Objective:   Vitals:   10/22/24 1320  BP: 114/70  Pulse: (!) 104  Temp: 98.3 F (36.8 C)  TempSrc: Oral  SpO2: 97%  Weight: 123 lb (55.8 kg)  Height: 5' 5 (1.651 m)    Physical Exam Physical Exam GENERAL: Appropriate to age, no acute distress. HEAD EYES EARS NOSE THROAT: Moist mucous membranes, atraumatic, normocephalic. CHEST: Clear to auscultation bilaterally, no wheezing, no crackles, no rales. CARDIAC: Regular rate and rhythm, normal S1, normal S2, no murmurs, no rubs, no gallops. ABDOMEN: Soft, nontender. NEUROLOGICAL: Motor and sensation grossly intact, alert and oriented times X 3. EXTREMITIES: Warm, well perfused, no edema.     CBC    Component Value Date/Time   WBC 53.0 08/20/2024 0000   WBC 6.1 02/28/2023 1544   RBC 3.47 (A) 08/20/2024 0000   HGB 10.8 (A) 08/20/2024 0000   HGB 11.3 04/01/2024 1404   HCT 34 (A) 08/20/2024 0000   HCT 33.9 (L) 04/01/2024 1404   PLT 249 08/20/2024 0000   PLT 264 04/01/2024 1404   MCV 94 04/01/2024 1404   MCH 31.4 04/01/2024 1404   MCH 30.6 02/28/2023 1544   MCHC 33.3 04/01/2024 1404   MCHC 33.6 02/28/2023 1544   RDW 13.0 04/01/2024 1404   LYMPHSABS 1.9 04/01/2024 1404   EOSABS 0.1 04/01/2024 1404   BASOSABS 0.1 04/01/2024 1404     Chest imaging: I reviewed her CT scan performed on November 8, it shows  PFT:     No data to display          Labs:    Echo:       Assessment & Plan:   Assessment and Plan Assessment & Plan Chronic obstructive pulmonary disease with acute exacerbation and emphysema COPD exacerbation with persistent symptoms despite steroids and antibiotics. CT scan showed emphysema, and the prior left upper lobe infiltrate that seems to be improving.  Inhaler use compromised by facial palsy. Previous exacerbations  linked to viral triggers. Current exacerbation unresponsive to prednisone .  - Prescribed Breztri inhaler, provided sample for trial.  Likely not going to be beneficial due to inability to form a seal around her mouth - Arranged home delivery of nebulizer machine. - Prescribed nebulizer treatments with long-acting medications and albuterol  PRN. - Ordered chest x-ray to rule out pneumonia. - Instructed on inhaler technique, emphasized mouth rinsing post-use. - Advised albuterol  as rescue inhaler. - Monitor oxygen levels, especially during exertion. - Avoid further prednisone  unless necessary, monitor for side effects.  Facial palsy status post facial reanimation impacting inhaler use Facial palsy post-reanimation limits inhaler effectiveness due to inability to form a seal. - Provided nebulizer machine for home use. - Prescribed spacer for interim use until nebulizer available.        Zola Herter, MD [facility] Office: [phone]

## 2024-10-22 NOTE — Addendum Note (Signed)
 Addended by: MOODY RANCHER on: 10/22/2024 03:54 PM   Modules accepted: Orders

## 2024-10-24 ENCOUNTER — Encounter (HOSPITAL_COMMUNITY): Payer: Self-pay

## 2024-10-29 ENCOUNTER — Ambulatory Visit (HOSPITAL_COMMUNITY)
Admission: RE | Admit: 2024-10-29 | Discharge: 2024-10-29 | Disposition: A | Discharge status: Home / Self Care | Source: Ambulatory Visit | Attending: Nurse Practitioner | Admitting: Nurse Practitioner

## 2024-10-29 ENCOUNTER — Ambulatory Visit: Payer: Self-pay

## 2024-10-29 DIAGNOSIS — R072 Precordial pain: Secondary | ICD-10-CM | POA: Insufficient documentation

## 2024-10-29 DIAGNOSIS — I2510 Atherosclerotic heart disease of native coronary artery without angina pectoris: Secondary | ICD-10-CM | POA: Diagnosis not present

## 2024-10-29 DIAGNOSIS — R0602 Shortness of breath: Secondary | ICD-10-CM | POA: Insufficient documentation

## 2024-10-29 MED ORDER — NITROGLYCERIN 0.4 MG SL SUBL
0.8000 mg | SUBLINGUAL_TABLET | Freq: Once | SUBLINGUAL | Status: AC
  Administered 2024-10-29: 0.8 mg via SUBLINGUAL

## 2024-10-29 MED ORDER — IOHEXOL 350 MG/ML SOLN
100.0000 mL | Freq: Once | INTRAVENOUS | Status: AC | PRN
  Administered 2024-10-29: 100 mL via INTRAVENOUS

## 2024-10-29 MED ORDER — DILTIAZEM HCL 25 MG/5ML IV SOLN: 10.0000 mg | INTRAVENOUS | Status: DC | PRN

## 2024-10-29 MED ORDER — METOPROLOL TARTRATE 5 MG/5ML IV SOLN
10.0000 mg | Freq: Once | INTRAVENOUS | Status: AC | PRN
  Administered 2024-10-29: 5 mg via INTRAVENOUS

## 2024-10-29 MED ORDER — METOPROLOL TARTRATE 5 MG/5ML IV SOLN
INTRAVENOUS | Status: AC
  Filled 2024-10-29: qty 10

## 2024-10-29 NOTE — Telephone Encounter (Signed)
 FYI Only or Action Required?: Action required by provider: clinical question for provider.pt requesting medication to help/prednisone   Patient is followed in Pulmonology for Acute bronchitis with COPD [facility]) , last seen on 10/22/2024 by Zaida Zola SAILOR, MD.  Called Nurse Triage reporting Cough.  Symptoms began several weeks ago.  Interventions attempted: Nebulizer treatments.  Symptoms are: gradually worsening.  Triage Disposition: See Physician Within 24 Hours  Patient/caregiver understands and will follow disposition?: No, wishes to speak with PCP     Copied from CRM #[id number]. Topic: Clinical - Red Word Triage >> Oct 29, 2024  9:15 AM Celestine FALCON wrote: Red Word that prompted transfer to Nurse Triage: Pt was just seen by Dr. Donzetta in [place] for a productive cough with green mucus.   Pt is calling back as her symptoms haven't improved, and she is still coughing nonstop with green yellow mucus. Pt is asking for prednisone  to be called into her pharmacy for her if possible. Reason for Disposition  SEVERE coughing spells (e.g., whooping sound after coughing, vomiting after coughing)  Answer Assessment - Initial Assessment Questions 1. ONSET: When did the cough begin?      X 2 weeks 2. SEVERITY: How bad is the cough today?      moderate 3. SPUTUM: Describe the color of your sputum (e.g., none, dry cough; clear, white, yellow, green)     Yellow and green mucous 4. HEMOPTYSIS: Are you coughing up any blood? If Yes, ask: How much? (e.g., flecks, streaks, tablespoons, etc.)     no 5. DIFFICULTY BREATHING: Are you having difficulty breathing? If Yes, ask: How bad is it? (e.g., mild, moderate, severe)      SOB  6. FEVER: Do you have a fever? If Yes, ask: What is your temperature, how was it measured, and when did it start?     no 7. CARDIAC HISTORY: Do you have any history of heart disease? (e.g., heart attack, congestive heart failure)      na 8. LUNG HISTORY:  Do you have any history of lung disease?  (e.g., pulmonary embolus, asthma, emphysema)     yes 9. PE RISK FACTORS: Do you have a history of blood clots? (or: recent major surgery, recent prolonged travel, bedridden)     na 10. OTHER SYMPTOMS: Do you have any other symptoms? (e.g., runny nose, wheezing, chest pain)       yes 11. PREGNANCY: Is there any chance you are pregnant? When was your last menstrual period?       na 12. TRAVEL: Have you traveled out of the country in the last month? (e.g., travel history, exposures)       na  Prescribed medication last week and pt not getting better: wanted to make provider aware - pulmonary provider. Pt stated provider informed her last week he may have to order her some regular prednisone  if her s/s do not improve.   Having CT scan today.  Protocols used: Cough - Acute Productive-A-AH

## 2024-10-30 ENCOUNTER — Other Ambulatory Visit: Payer: Self-pay | Admitting: Cardiology

## 2024-10-30 ENCOUNTER — Ambulatory Visit (HOSPITAL_COMMUNITY)
Admission: RE | Admit: 2024-10-30 | Discharge: 2024-10-30 | Disposition: A | Discharge status: Home / Self Care | Source: Ambulatory Visit | Attending: Cardiology | Admitting: Cardiology

## 2024-10-30 DIAGNOSIS — R931 Abnormal findings on diagnostic imaging of heart and coronary circulation: Secondary | ICD-10-CM

## 2024-11-04 ENCOUNTER — Ambulatory Visit: Payer: Self-pay | Admitting: Nurse Practitioner

## 2024-11-04 ENCOUNTER — Encounter: Payer: Self-pay | Admitting: Medical-Surgical

## 2024-11-04 DIAGNOSIS — R911 Solitary pulmonary nodule: Secondary | ICD-10-CM

## 2024-11-04 DIAGNOSIS — J441 Chronic obstructive pulmonary disease with (acute) exacerbation: Secondary | ICD-10-CM

## 2024-11-05 MED ORDER — CEFPODOXIME PROXETIL 200 MG PO TABS: 200.0000 mg | ORAL_TABLET | Freq: Two times a day (BID) | ORAL | 0 refills | Status: AC

## 2024-11-05 MED ORDER — AZITHROMYCIN 250 MG PO TABS: ORAL_TABLET | ORAL | 0 refills | Status: DC

## 2024-11-05 MED ORDER — PREDNISONE 20 MG PO TABS: 40.0000 mg | ORAL_TABLET | Freq: Every day | ORAL | 0 refills | Status: AC

## 2024-11-06 ENCOUNTER — Other Ambulatory Visit: Payer: Self-pay | Admitting: Medical-Surgical

## 2024-11-06 ENCOUNTER — Other Ambulatory Visit: Payer: Self-pay | Admitting: Nurse Practitioner

## 2024-11-06 ENCOUNTER — Ambulatory Visit

## 2024-11-06 DIAGNOSIS — R051 Acute cough: Secondary | ICD-10-CM

## 2024-11-10 DIAGNOSIS — M170 Bilateral primary osteoarthritis of knee: Secondary | ICD-10-CM | POA: Diagnosis not present

## 2024-11-10 DIAGNOSIS — M19041 Primary osteoarthritis, right hand: Secondary | ICD-10-CM | POA: Diagnosis not present

## 2024-11-10 DIAGNOSIS — M19012 Primary osteoarthritis, left shoulder: Secondary | ICD-10-CM | POA: Diagnosis not present

## 2024-11-10 DIAGNOSIS — S92354A Nondisplaced fracture of fifth metatarsal bone, right foot, initial encounter for closed fracture: Secondary | ICD-10-CM | POA: Diagnosis not present

## 2024-11-10 DIAGNOSIS — M19011 Primary osteoarthritis, right shoulder: Secondary | ICD-10-CM | POA: Diagnosis not present

## 2024-11-10 DIAGNOSIS — M19042 Primary osteoarthritis, left hand: Secondary | ICD-10-CM | POA: Diagnosis not present

## 2024-11-10 DIAGNOSIS — M19022 Primary osteoarthritis, left elbow: Secondary | ICD-10-CM | POA: Diagnosis not present

## 2024-11-10 DIAGNOSIS — M19021 Primary osteoarthritis, right elbow: Secondary | ICD-10-CM | POA: Diagnosis not present

## 2024-11-10 DIAGNOSIS — E785 Hyperlipidemia, unspecified: Secondary | ICD-10-CM | POA: Diagnosis not present

## 2024-11-11 DIAGNOSIS — S92351A Displaced fracture of fifth metatarsal bone, right foot, initial encounter for closed fracture: Secondary | ICD-10-CM | POA: Diagnosis not present

## 2024-11-12 ENCOUNTER — Telehealth: Payer: Self-pay

## 2024-11-12 NOTE — Telephone Encounter (Signed)
 Copied from CRM #[id number]. Topic: Clinical - Home Health Verbal Orders >> Nov 12, 2024  3:19 PM Montie POUR wrote: Caller/Agency: [facility] Callback Number: [phone] Secured Voicemail Service Requested: Physical Therapy Frequency: 2 times weekly 2 weeks; 1 time weekly for 7 weeks, [facility] Any new concerns about the patient? No >> Nov 12, 2024  3:24 PM Montie POUR wrote: Tinnie said blood pressure was up a little today and she is followed by a Cardiologist

## 2024-11-12 NOTE — Telephone Encounter (Signed)
 This task has been completed as requested.  Left a vm msg for Lauren at [facility] at [phone] regarding the request for physical therapy. Verbal order provided. Direct call back information provided. No further action needed.

## 2024-11-14 ENCOUNTER — Encounter: Payer: Self-pay | Admitting: Medical-Surgical

## 2024-11-14 ENCOUNTER — Telehealth: Payer: Self-pay

## 2024-11-14 NOTE — Telephone Encounter (Signed)
 Copied from CRM #[id number]. Topic: Clinical - Medication Question >> Nov 14, 2024 12:11 PM Avram MATSU wrote: Reason for CRM: Wynell is calling from [facility] and would like to know if the patient should be taking a compound medication or valsartan  (DIOVAN ) 160 MG tablet [[id number]]  [phone]

## 2024-11-15 NOTE — Telephone Encounter (Signed)
 This task has been completed as requested. Please review other TE for additional information. Contacted the pharmacist at [facility]. Aware of the provider's note regarding current HTN regimen. Verbalized understanding. No other inquiries during the call. No further action needed.

## 2024-11-26 ENCOUNTER — Ambulatory Visit

## 2024-11-26 VITALS — BP 149/75 | HR 84 | Temp 97.9°F | Ht 65.0 in | Wt 120.0 lb

## 2024-11-26 DIAGNOSIS — Z122 Encounter for screening for malignant neoplasm of respiratory organs: Secondary | ICD-10-CM

## 2024-11-26 DIAGNOSIS — J441 Chronic obstructive pulmonary disease with (acute) exacerbation: Secondary | ICD-10-CM | POA: Diagnosis not present

## 2024-11-26 DIAGNOSIS — R911 Solitary pulmonary nodule: Secondary | ICD-10-CM

## 2024-11-26 DIAGNOSIS — Z87891 Personal history of nicotine dependence: Secondary | ICD-10-CM | POA: Diagnosis not present

## 2024-11-26 NOTE — Patient Instructions (Signed)
" °  VISIT SUMMARY: Today, we discussed your current treatment regimen for COPD and addressed the recent exacerbation you experienced due to running out of your medications. We reviewed your medications and considered alternative options to help manage your symptoms and costs.  YOUR PLAN: -CHRONIC OBSTRUCTIVE PULMONARY DISEASE WITH EMPHYSEMA: COPD is a chronic lung condition that makes it hard to breathe due to damage to the lungs. You experienced a recent worsening of symptoms because you ran out of your medications. Continue using Brovana , Pulmicort , and DuoNebs as prescribed. You can use DuoNebs up to four times daily if needed for rescue. We also discussed considering the Breztri  inhaler as a maintenance therapy option. Please monitor for any signs of lower airway infection and contact us  if your symptoms worsen. Make sure to keep a steady supply of your medications and reach out to our office if you have any issues with your pharmacy.  INSTRUCTIONS: Please continue with your current medications: Brovana , Pulmicort , and DuoNebs. Use DuoNebs up to every four hours as needed, use it at least 2-3 times a day. Monitor for any signs of infection and contact us  if your symptoms worsen. Ensure you have a steady supply of your medications and contact our office if you encounter any issues with your pharmacy.                      Contains text generated by [employer].   "

## 2024-11-26 NOTE — Progress Notes (Signed)
 "   Subjective:   PATIENT ID: Krista [name] GENDER: female DOB: [birth year], MRN: [id number]   HPI Discussed the use of AI scribe software for clinical note transcription with the patient, who gave verbal consent to proceed.  History of Present Illness Krista [name] is a 73 year old female with COPD who presents for follow-up regarding her nebulizer treatment regimen.  She has been using nebulizer treatments including Brovana , Pulmicort , and DuoNebs for her COPD. She takes all three medications in the morning, two at night, and DuoNebs in the middle of the day. She ran out of her medications two days ago but has since refilled them. She experiences improvement in her symptoms with this regimen, although she experienced a return of 'sticky mucus' when she missed her doses.  She has a history of being given high doses of prednisone  in the past, and has previously experienced confusion with high doses.  She has a supply of DuoNebs and is considering the financial implications of her medication regimen, as some treatments are more expensive than others. She is retired and has time to manage her treatments at home.     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   H/O hysterectomy for benign disease 08/15/2017   H/O: Bell's palsy 1974   per pt during pregnancy , left side paralysis, told possible bell's palsy or stroke, after testing done had  left mastoidectomy;  residual left side facialy asymmetry   History of basal cell carcinoma (BCC) excision    History of Bell's palsy 08/15/2017   History of cerebral aneurysm 2010   s/p coiling  ;  per pt was an incidental finding on imaging   History of Clostridioides difficile infection 2010   treated   History of colon polyps 08/15/2017   History of COVID-19 05/08/2021   positive result in care everywhere; per pt mild to moderate symptoms that resolved   History of Helicobacter pylori infection 2010   treated   History of left mastoidectomy 03/07/2018   History of nonmelanoma skin cancer 08/15/2017   History of osteoporosis 08/15/2017   Need records     Hyperlipidemia    Hypertension 05/12/1973   followed by pcp   Lymphocytic colitis    followed by dr c. lindaann (GI);  dx by biopsy 09-13-2017   Macular degeneration of both eyes    Non-intractable vomiting with nausea    followed by dr lindaann   OA (osteoarthritis)    Osteoporosis    Pneumonia    PONV (postoperative nausea and vomiting)    Spondylosis, unspecified    cervical and lumbar   Wears hearing aid in both ears      Family History  Problem Relation Age of Onset   Heart failure Mother    Arthritis Mother    Hearing loss Mother    Heart disease Mother  Hypertension Mother    Stroke Mother    Varicose Veins Mother    Cancer Father    Alcohol abuse Father    Early death Father    Hearing loss Father    Cancer Brother    Alcohol abuse Brother    ADD / ADHD Son    Alcohol abuse Son    Alcohol abuse Sister    Arthritis Sister    Alcohol abuse Brother    Cancer Brother    Alcohol abuse Daughter    Arthritis Sister    Cancer Sister    Heart disease Sister        Deceased   Arthritis Sister    Arthritis Brother    Hearing loss Brother    ADD / ADHD Son    Alcohol abuse Son    Cancer Brother    Vision loss Brother    Arthritis Sister    Cancer Sister    Heart disease Sister    Alcohol abuse  Sister    Arthritis Sister    Vision loss Brother    Vision loss Brother    Alcohol abuse Brother    Alcohol abuse Daughter    Early death Sister      Social History   Socioeconomic History   Marital status: Married    Spouse name: Randal   Number of children: 2   Years of education: 14   Highest education level: Associate degree: academic program  Occupational History   Occupation: retired    Comment: control and instrumentation engineer  Tobacco Use   Smoking status: Former    Current packs/day: 0.00    Average packs/day: 0.8 packs/day for 52.5 years (42.0 ttl pk-yrs)    Types: Cigarettes    Start date: 10/06/1967    Quit date: 10/06/2011    Years since quitting: 13.1   Smokeless tobacco: Never   Tobacco comments:    Quit Date 10/06/2011  Vaping Use   Vaping status: Never Used  Substance and Sexual Activity   Alcohol use: Not Currently    Alcohol/week: 1.0 standard drink of alcohol    Comment: Rarely   Drug use: Never   Sexual activity: Not Currently    Birth control/protection: Abstinence, None  Other Topics Concern   Not on file  Social History Narrative   Lives with her husband. She enjoys playing with her grandson.   Social Drivers of Health   Tobacco Use: Medium Risk (11/26/2024)   Patient History    Smoking Tobacco Use: Former    Smokeless Tobacco Use: Never    Passive Exposure: Not on file  Financial Resource Strain: Low Risk (09/30/2024)   Overall Financial Resource Strain (CARDIA)    Difficulty of Paying Living Expenses: Not very hard  Food Insecurity: No Food Insecurity (09/30/2024)   Epic    Worried About Programme Researcher, Broadcasting/film/video in the Last Year: Never true    Ran Out of Food in the Last Year: Never true  Transportation Needs: No Transportation Needs (11/10/2024)   Received [facility]    In the past 12 months, has lack of transportation kept you from medical appointments or from getting medications?: No    In the past 12 months, has lack of  transportation kept you from meetings, work, or from getting things needed for daily living?: No  Physical Activity: Insufficiently Active (09/30/2024)   Exercise Vital Sign    Days of Exercise per Week: 2 days    Minutes  of Exercise per Session: 30 min  Stress: No Stress Concern Present (09/30/2024)   [employer] of Occupational Health - Occupational Stress Questionnaire    Feeling of Stress: Not at all  Social Connections: Socially Integrated (09/30/2024)   Social Connection and Isolation Panel    Frequency of Communication with Friends and Family: More than three times a week    Frequency of Social Gatherings with Friends and Family: Once a week    Attends Religious Services: More than 4 times per year    Active Member of [employer] or Organizations: Yes    Attends Engineer, Structural: More than 4 times per year    Marital Status: Married  Catering Manager Violence: Not At Risk (11/10/2024)   Received from [facility]   HITS    Over the last 12 months how often did your partner physically hurt you?: Never    Over the last 12 months how often did your partner insult you or talk down to you?: Never    Over the last 12 months how often did your partner threaten you with physical harm?: Never    Over the last 12 months how often did your partner scream or curse at you?: Never  Depression (PHQ2-9): High Risk (10/17/2024)   Depression (PHQ2-9)    PHQ-2 Score: 11  Alcohol Screen: Low Risk (07/04/2024)   Alcohol Screen    Last Alcohol Screening Score (AUDIT): 0  Housing: Low Risk (09/30/2024)   Epic    Unable to Pay for Housing in the Last Year: No    Number of Times Moved in the Last Year: 0    Homeless in the Last Year: No  Utilities: Not At Risk (02/07/2024)   [facility] Utilities    Threatened with loss of utilities: No  Health Literacy: Adequate Health Literacy (02/07/2024)   B1300 Health Literacy    Frequency of need for help with medical instructions: Never      Allergies[1]   Outpatient Medications Prior to Visit  Medication Sig Dispense Refill   albuterol  (VENTOLIN  HFA) 108 (90 Base) MCG/ACT inhaler Inhale 2 puffs into the lungs every 6 (six) hours as needed for wheezing or shortness of breath. 8 g 0   arformoterol  (BROVANA ) 15 MCG/2ML NEBU Take 2 mLs (15 mcg total) by nebulization 2 (two) times daily. 120 mL 6   aspirin  EC 81 MG tablet Take 1 tablet (81 mg total) by mouth daily. Swallow whole.     atorvastatin  (LIPITOR) 40 MG tablet Take 1 tablet (40 mg total) by mouth daily. 90 tablet 3   benzonatate  (TESSALON ) 200 MG capsule Take 1 capsule (200 mg total) by mouth 3 (three) times daily as needed for cough. 45 capsule 0   budesonide  (PULMICORT ) 0.25 MG/2ML nebulizer solution Take 2 mLs (0.25 mg total) by nebulization daily. 60 mL 12   cholecalciferol (VITAMIN D3) 25 MCG (1000 UNIT) tablet Take 1,000 Units by mouth every evening.     cyclobenzaprine  (FLEXERIL ) 10 MG tablet Take 1 tablet (10 mg total) by mouth 2 (two) times daily at 8 am and 10 pm. 120 tablet 5   ipratropium-albuterol  (DUONEB) 0.5-2.5 (3) MG/3ML SOLN Take 3 mLs by nebulization every 4 (four) hours as needed. 360 mL 5   lidocaine  (XYLOCAINE ) 2 % solution Use a clean cotton swab to apply a small amount of lidocaine  to canker sores ever 4 hours as needed. 100 mL 0   meloxicam  (MOBIC ) 15 MG tablet Take 15 mg by mouth daily as  needed.     metoprolol  tartrate (LOPRESSOR ) 100 MG tablet Take 1 tablet 2 hours prior to cardiac CT 1 tablet 0   nitroGLYCERIN  (NITROSTAT ) 0.4 MG SL tablet Place 1 tablet (0.4 mg total) under the tongue every 5 (five) minutes as needed for chest pain. 25 tablet 2   valsartan  (DIOVAN ) 160 MG tablet Take 1 tablet (160 mg total) by mouth daily. 90 tablet 3   azithromycin  (ZITHROMAX  Z-PAK) 250 MG tablet Take 2 tablets (500mg ) the first day. Then take 250mg  daily for 4 more days after 6 each 0   budesonide -glycopyrrolate-formoterol  (BREZTRI  AEROSPHERE) 160-9-4.8 MCG/ACT  AERO inhaler Inhale 2 puffs into the lungs in the morning and at bedtime. (Patient not taking: Reported on 11/26/2024)     gabapentin  (NEURONTIN ) 300 MG capsule Take 1 capsule (300 mg total) by mouth 3 (three) times daily as needed.     promethazine -dextromethorphan (PROMETHAZINE -DM) 6.25-15 MG/5ML syrup Take 5 mLs by mouth 4 (four) times daily as needed. 240 mL 0   No facility-administered medications prior to visit.    ROS Reviewed all systems and reported negative except as above     Objective:   Vitals:   11/26/24 0931  BP: (!) 149/75  Pulse: 84  Temp: 97.9 F (36.6 C)  TempSrc: Oral  SpO2: 100%  Weight: 120 lb (54.4 kg)  Height: 5' 5 (1.651 m)    Physical Exam Physical Exam GENERAL: Appropriate to age, no acute distress. HEAD EYES EARS NOSE THROAT: Moist mucous membranes, atraumatic, normocephalic. CHEST: Clear to auscultation bilaterally, no wheezing, no crackles, no rales. CARDIAC: Regular rate and rhythm, normal S1, normal S2, no murmurs, no rubs, no gallops. ABDOMEN: Soft, nontender. NEUROLOGICAL: Motor and sensation grossly intact, alert and oriented times X 3. EXTREMITIES: Warm, well perfused, no edema.     CBC    Component Value Date/Time   WBC 53.0 08/20/2024 0000   WBC 6.1 02/28/2023 1544   RBC 3.47 (A) 08/20/2024 0000   HGB 10.8 (A) 08/20/2024 0000   HGB 11.3 04/01/2024 1404   HCT 34 (A) 08/20/2024 0000   HCT 33.9 (L) 04/01/2024 1404   PLT 249 08/20/2024 0000   PLT 264 04/01/2024 1404   MCV 94 04/01/2024 1404   MCH 31.4 04/01/2024 1404   MCH 30.6 02/28/2023 1544   MCHC 33.3 04/01/2024 1404   MCHC 33.6 02/28/2023 1544   RDW 13.0 04/01/2024 1404   LYMPHSABS 1.9 04/01/2024 1404   EOSABS 0.1 04/01/2024 1404   BASOSABS 0.1 04/01/2024 1404              Assessment & Plan:   Assessment and Plan Assessment & Plan Chronic obstructive pulmonary disease with emphysema COPD with recent exacerbation due to medication lapse. Current regimen  includes Brovana , Pulmicort , and DuoNebs. Discussed Yupeleri and Breztri  as alternatives. Emphasized medication adherence to prevent exacerbations. Has been having some yellow sputum after she ran out of Brovana  and Pulmicort  - Continue Brovana , Pulmicort , and DuoNebs. - Use DuoNebs as rescue up to four times daily if needed. - Consider Breztri  inhaler for maintenance therapy with a spacer if she needs to be somewhere other than her house. Although poor inhalation technique due to palsy. - Monitor for lower airway infection; contact provider if symptoms worsen. - Ensure medication supply; contact office for pharmacy issues.        Zola Herter, MD [facility] Office: [phone]       [1]  Allergies Allergen Reactions   Tomato Hives  Codeine Other (See Comments)    Hyperactivity wired super hyperactivity      Colestipol Swelling    Lips swollen/NO resp issues   Kiwi Extract Hives   Other Hives, Rash and Other (See Comments)    Nectarines--tongue swells    Plum Pulp Hives   Tape Hives and Rash   Doxycycline  Hyclate Other (See Comments)    Abdominal pain and diarrhea   Prednisone  Other (See Comments)    Irritability     "

## 2024-12-03 ENCOUNTER — Ambulatory Visit

## 2024-12-10 ENCOUNTER — Other Ambulatory Visit: Payer: Self-pay

## 2024-12-10 DIAGNOSIS — I10 Essential (primary) hypertension: Secondary | ICD-10-CM | POA: Insufficient documentation

## 2024-12-10 DIAGNOSIS — F419 Anxiety disorder, unspecified: Secondary | ICD-10-CM | POA: Insufficient documentation

## 2024-12-10 DIAGNOSIS — J439 Emphysema, unspecified: Secondary | ICD-10-CM | POA: Insufficient documentation

## 2024-12-10 DIAGNOSIS — M479 Spondylosis, unspecified: Secondary | ICD-10-CM | POA: Insufficient documentation

## 2024-12-10 DIAGNOSIS — H3530 Unspecified macular degeneration: Secondary | ICD-10-CM | POA: Insufficient documentation

## 2024-12-10 DIAGNOSIS — T7840XA Allergy, unspecified, initial encounter: Secondary | ICD-10-CM | POA: Insufficient documentation

## 2024-12-10 DIAGNOSIS — Z974 Presence of external hearing-aid: Secondary | ICD-10-CM | POA: Insufficient documentation

## 2024-12-10 DIAGNOSIS — F32A Depression, unspecified: Secondary | ICD-10-CM | POA: Insufficient documentation

## 2024-12-10 DIAGNOSIS — K52832 Lymphocytic colitis: Secondary | ICD-10-CM | POA: Insufficient documentation

## 2024-12-10 DIAGNOSIS — M51369 Other intervertebral disc degeneration, lumbar region without mention of lumbar back pain or lower extremity pain: Secondary | ICD-10-CM | POA: Insufficient documentation

## 2024-12-10 DIAGNOSIS — M151 Heberden's nodes (with arthropathy): Secondary | ICD-10-CM | POA: Insufficient documentation

## 2024-12-10 DIAGNOSIS — J189 Pneumonia, unspecified organism: Secondary | ICD-10-CM | POA: Insufficient documentation

## 2024-12-10 DIAGNOSIS — Z9889 Other specified postprocedural states: Secondary | ICD-10-CM | POA: Insufficient documentation

## 2024-12-10 DIAGNOSIS — M1990 Unspecified osteoarthritis, unspecified site: Secondary | ICD-10-CM | POA: Insufficient documentation

## 2024-12-10 DIAGNOSIS — E785 Hyperlipidemia, unspecified: Secondary | ICD-10-CM | POA: Insufficient documentation

## 2024-12-10 DIAGNOSIS — R112 Nausea with vomiting, unspecified: Secondary | ICD-10-CM | POA: Insufficient documentation

## 2024-12-10 DIAGNOSIS — J449 Chronic obstructive pulmonary disease, unspecified: Secondary | ICD-10-CM | POA: Insufficient documentation

## 2024-12-10 DIAGNOSIS — K219 Gastro-esophageal reflux disease without esophagitis: Secondary | ICD-10-CM | POA: Insufficient documentation

## 2024-12-11 ENCOUNTER — Encounter: Payer: Self-pay | Admitting: Cardiology

## 2024-12-11 ENCOUNTER — Ambulatory Visit: Attending: Cardiology | Admitting: Cardiology

## 2024-12-11 VITALS — BP 134/76 | HR 98 | Ht 65.0 in | Wt 118.0 lb

## 2024-12-11 DIAGNOSIS — I10 Essential (primary) hypertension: Secondary | ICD-10-CM

## 2024-12-11 DIAGNOSIS — E782 Mixed hyperlipidemia: Secondary | ICD-10-CM

## 2024-12-11 DIAGNOSIS — I2510 Atherosclerotic heart disease of native coronary artery without angina pectoris: Secondary | ICD-10-CM

## 2024-12-11 NOTE — Patient Instructions (Signed)

## 2024-12-11 NOTE — Progress Notes (Signed)
 " Cardiology Office Note:    Date:  12/11/2024   ID:  Krista [name], DOB 1951-02-07, MRN [id number]  PCP:  Willo Mini, NP  Cardiologist:  Jennifer JONELLE Crape, MD   Referring MD: Willo Mini, NP    ASSESSMENT:    1. Mild CAD   2. Essential hypertension   3. Mixed hyperlipidemia    PLAN:    In order of problems listed above:  Coronary artery disease: CT coronary angiography report discussed with the patient at length and questions were answered to her satisfaction.  She is happy about it.  She plans to exercise on a regular basis.  She has had issues with her foot and fracture of the foot.  She has recovered well from this. Essential hypertension: Blood pressure is stable and diet was emphasized.  Lifestyle modification urged. Mixed dyslipidemia: On lipid-lowering medications followed by primary care.  Goal LDL should be less than 60.  Diet was emphasized extensively. Patient will be seen in follow-up appointment in 12 months or earlier if the patient has any concerns.    Medication Adjustments/Labs and Tests Ordered: Current medicines are reviewed at length with the patient today.  Concerns regarding medicines are outlined above.  No orders of the defined types were placed in this encounter.  No orders of the defined types were placed in this encounter.    No chief complaint on file.    History of Present Illness:    Krista [name] is a 74 y.o. female.  Patient has past medical history of mild coronary artery disease, essential hypertension and mixed dyslipidemia.  She denies any problems at this time and takes care of activities of daily living.  No chest pain orthopnea or PND.  At the time of my evaluation, the patient is alert awake oriented and in no distress.  Past Medical History:  Diagnosis Date   Acute bronchitis with COPD (HCC) 05/10/2018   Allergy 1974   Angina pectoris 05/21/2024   Annual physical exam 02/28/2023   Anxiety 2021   Bilateral hearing loss 08/15/2017    Bilateral wrist pain 05/06/2020   Brain aneurysm 08/15/2017   S/p repair     Cardiac murmur 05/21/2024   Cervical radiculitis 10/31/2023   COPD (chronic obstructive pulmonary disease) (HCC)    per pt no issues, chest CT in epic 07-29-2021   Coronary artery calcification seen on CT scan 05/21/2024   DDD (degenerative disc disease), cervical    DDD (degenerative disc disease), lumbar    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    Depression    2021   Disorder of rotator cuff 03/07/2024   Emphysema of lung (HCC) 2012   Essential hypertension 09/05/2017   Facet arthritis of lumbar region 05/06/2020   Facial asymmetry, acquired 1974   left side paralysis   Facial paralysis 02/16/2022   Formatting of this note might be different from the original. Added automatically from request for surgery [id number]     Former smoker 05/21/2024   GERD (gastroesophageal reflux disease) 2021   Has gotten worse this year.   Hemifacial spasm affecting both sides of face 02/16/2022   Formatting of this note might be different from the original. Added automatically from request for surgery [id number]     History of basal cell carcinoma (BCC) excision    History of cerebral aneurysm 2010   s/p coiling  ;  per pt was an incidental finding on imaging   History of Clostridioides  difficile infection 2010   treated   History of COVID-19 05/08/2021   positive result in care everywhere; per pt mild to moderate symptoms that resolved   History of Helicobacter pylori infection 2010   treated   Hypercalcemia 02/28/2023   Hyperlipidemia    Hypertension 05/12/1973   followed by pcp   Impacted cerumen of left ear 03/07/2018   Inflammatory arthritis 03/13/2020   Lymphocytic colitis    followed by dr c. lindaann (GI);  dx by biopsy 09-13-2017   Macular degeneration of both eyes    Mixed hyperlipidemia 09/05/2017   Non-intractable vomiting with nausea    followed by dr  lindaann   OA (osteoarthritis)    Osteoarthritis of left little finger 07/31/2018   Osteoporosis    Other spondylosis with radiculopathy, lumbar region 12/13/2022   Pneumonia    PONV (postoperative nausea and vomiting)    Primary osteoarthritis of left hip 02/28/2023   Ptosis of right eyelid 02/16/2022   Formatting of this note might be different from the original. Added automatically from request for surgery [id number]     Spondylosis, unspecified    cervical and lumbar   Tympanosclerosis, left ear 03/07/2018   Unintentional weight loss 02/28/2023   Wears hearing aid in both ears     Past Surgical History:  Procedure Laterality Date   ANTERIOR LAT LUMBAR FUSION N/A 12/13/2022   Procedure: PRONE TRANSPSOAS INTERBODY FUSION, LUMBAR THREE-FOUR;  Surgeon: Lanis Pupa, MD;  Location: [facility] OR;  Service: Neurosurgery;  Laterality: N/A;  3C   CARPAL TUNNEL RELEASE Bilateral    early 2000s   CATARACT EXTRACTION W/ INTRAOCULAR LENS IMPLANT Bilateral 2014   CEREBRAL ANEURYSM REPAIR  2010   endocerebral coiling   COLONOSCOPY  09/13/2017   COSMETIC SURGERY  2023   Mohrs Surgery on nose   DISTAL INTERPHALANGEAL JOINT FUSION Left 09/16/2021   Procedure: left small finger distal interphalangeal joint fusion;  Surgeon: Alyse Agent, MD;  Location: [facility] [place];  Service: Orthopedics;  Laterality: Left;  with MAC anesthesia   ESOPHAGOGASTRODUODENOSCOPY  09/22/2020   EYE SURGERY  2020   Blepharoplasty   FACIAL RECONSTRUCTION SURGERY  03/28/2022   [facility]   FOOT SURGERY Bilateral    eary 2000s;  bilateral bunionectomy  and removal bone right 5th toe   FRACTURE SURGERY  1968   Shattered right ankle   MASTOIDECTOMY Left 1974   RADIOFREQUENCY ABLATION  09/01/2021   lumbar , left side   ROTATOR CUFF REPAIR Right 2014   SALPINGOOPHORECTOMY Bilateral 1995   w/ excision endometriosis via laparotomy   SPINE SURGERY  2021/2022   DDD, osteoarthritis   TONSILLECTOMY AND  ADENOIDECTOMY  1970   TOTAL ABDOMINAL HYSTERECTOMY  1987   TRIGGER FINGER RELEASE Bilateral    early 2000s; one finger each hand   TUBAL LIGATION  1982   WRIST GANGLION EXCISION Left    2000s    Current Medications: Active Medications[1]   Allergies:   Tomato, Codeine, Colestipol, Kiwi extract, Other, Plum pulp, Tape, and Doxycycline  hyclate   Social History   Socioeconomic History   Marital status: Married    Spouse name: Randal   Number of children: 2   Years of education: 14   Highest education level: Associate degree: academic program  Occupational History   Occupation: retired    Comment: control and instrumentation engineer  Tobacco Use   Smoking status: Former    Current packs/day: 0.00    Average packs/day: 0.8 packs/day for 52.5 years (  42.0 ttl pk-yrs)    Types: Cigarettes    Start date: 10/06/1967    Quit date: 10/06/2011    Years since quitting: 13.1   Smokeless tobacco: Never   Tobacco comments:    Quit Date 10/06/2011  Vaping Use   Vaping status: Never Used  Substance and Sexual Activity   Alcohol use: Not Currently    Alcohol/week: 1.0 standard drink of alcohol    Comment: Rarely   Drug use: Never   Sexual activity: Not Currently    Birth control/protection: Abstinence, None  Other Topics Concern   Not on file  Social History Narrative   Lives with her husband. She enjoys playing with her grandson.   Social Drivers of Health   Tobacco Use: Medium Risk (12/11/2024)   Patient History    Smoking Tobacco Use: Former    Smokeless Tobacco Use: Never    Passive Exposure: Not on file  Financial Resource Strain: Low Risk (09/30/2024)   Overall Financial Resource Strain (CARDIA)    Difficulty of Paying Living Expenses: Not very hard  Food Insecurity: No Food Insecurity (09/30/2024)   Epic    Worried About Programme Researcher, Broadcasting/film/video in the Last Year: Never true    Ran Out of Food in the Last Year: Never true  Transportation Needs: No Transportation Needs (11/10/2024)   Received  from [facility]   Epic    In the past 12 months, has lack of transportation kept you from medical appointments or from getting medications?: No    In the past 12 months, has lack of transportation kept you from meetings, work, or from getting things needed for daily living?: No  Physical Activity: Insufficiently Active (09/30/2024)   Exercise Vital Sign    Days of Exercise per Week: 2 days    Minutes of Exercise per Session: 30 min  Stress: No Stress Concern Present (09/30/2024)   [employer] of Occupational Health - Occupational Stress Questionnaire    Feeling of Stress: Not at all  Social Connections: Socially Integrated (09/30/2024)   Social Connection and Isolation Panel    Frequency of Communication with Friends and Family: More than three times a week    Frequency of Social Gatherings with Friends and Family: Once a week    Attends Religious Services: More than 4 times per year    Active Member of Clubs or Organizations: Yes    Attends Banker Meetings: More than 4 times per year    Marital Status: Married  Depression (PHQ2-9): High Risk (10/17/2024)   Depression (PHQ2-9)    PHQ-2 Score: 11  Alcohol Screen: Low Risk (07/04/2024)   Alcohol Screen    Last Alcohol Screening Score (AUDIT): 0  Housing: Low Risk (09/30/2024)   Epic    Unable to Pay for Housing in the Last Year: No    Number of Times Moved in the Last Year: 0    Homeless in the Last Year: No  Utilities: Not At Risk (02/07/2024)   AHC Utilities    Threatened with loss of utilities: No  Health Literacy: Adequate Health Literacy (02/07/2024)   B1300 Health Literacy    Frequency of need for help with medical instructions: Never     Family History: The patient's family history includes ADD / ADHD in her son and son; Alcohol abuse in her brother, brother, brother, daughter, daughter, father, sister, sister, son, and son; Arthritis in her brother, mother, sister, sister, sister, sister, and sister;  Cancer in her brother, brother,  brother, father, sister, and sister; Early death in her father and sister; Hearing loss in her brother, father, and mother; Heart disease in her mother, sister, and sister; Heart failure in her mother; Hypertension in her mother; Stroke in her mother; Varicose Veins in her mother; Vision loss in her brother, brother, and brother.  ROS:   Please see the history of present illness.    All other systems reviewed and are negative.  EKGs/Labs/Other Studies Reviewed:    The following studies were reviewed today: .SABRA   I discussed my findings with the patient at length   Recent Labs: 04/01/2024: Magnesium  2.0; TSH 0.897 08/20/2024: ALT 15; Hemoglobin 10.8; Platelets 249 10/17/2024: BUN 20; Creatinine, Ser 1.11; Potassium 4.4; Sodium 134  Recent Lipid Panel    Component Value Date/Time   CHOL 165 04/01/2024 1404   TRIG 106 04/01/2024 1404   HDL 67 04/01/2024 1404   CHOLHDL 2.5 04/01/2024 1404   CHOLHDL 3.2 05/09/2023 1428   LDLCALC 79 04/01/2024 1404   LDLCALC 118 (H) 05/09/2023 1428    Physical Exam:    VS:  BP 134/76   Pulse 98   Ht 5' 5 (1.651 m)   Wt 118 lb 0.6 oz (53.5 kg)   SpO2 97%   BMI 19.64 kg/m     Wt Readings from Last 3 Encounters:  12/11/24 118 lb 0.6 oz (53.5 kg)  11/26/24 120 lb (54.4 kg)  10/22/24 123 lb (55.8 kg)     GEN: Patient is in no acute distress HEENT: Normal NECK: No JVD; No carotid bruits LYMPHATICS: No lymphadenopathy CARDIAC: Hear sounds regular, 2/6 systolic murmur at the apex. RESPIRATORY:  Clear to auscultation without rales, wheezing or rhonchi  ABDOMEN: Soft, non-tender, non-distended MUSCULOSKELETAL:  No edema; No deformity  SKIN: Warm and dry NEUROLOGIC:  Alert and oriented x 3 PSYCHIATRIC:  Normal affect   Signed, Jennifer JONELLE Crape, MD  12/11/2024 4:28 PM    [facility]     [1]  Current Meds  Medication Sig   albuterol  (VENTOLIN  HFA) 108 (90 Base) MCG/ACT inhaler Inhale 2  puffs into the lungs every 6 (six) hours as needed for wheezing or shortness of breath.   arformoterol  (BROVANA ) 15 MCG/2ML NEBU Take 2 mLs (15 mcg total) by nebulization 2 (two) times daily.   aspirin  EC 81 MG tablet Take 1 tablet (81 mg total) by mouth daily. Swallow whole.   atorvastatin  (LIPITOR) 40 MG tablet Take 1 tablet (40 mg total) by mouth daily.   benzonatate  (TESSALON ) 200 MG capsule Take 1 capsule (200 mg total) by mouth 3 (three) times daily as needed for cough.   budesonide  (PULMICORT ) 0.25 MG/2ML nebulizer solution Take 2 mLs (0.25 mg total) by nebulization daily.   budesonide -glycopyrrolate-formoterol  (BREZTRI  AEROSPHERE) 160-9-4.8 MCG/ACT AERO inhaler Inhale 2 puffs into the lungs in the morning and at bedtime.   cholecalciferol (VITAMIN D3) 25 MCG (1000 UNIT) tablet Take 1,000 Units by mouth every evening.   cyclobenzaprine  (FLEXERIL ) 10 MG tablet Take 1 tablet (10 mg total) by mouth 2 (two) times daily at 8 am and 10 pm.   ipratropium-albuterol  (DUONEB) 0.5-2.5 (3) MG/3ML SOLN Take 3 mLs by nebulization every 4 (four) hours as needed.   lidocaine  (XYLOCAINE ) 2 % solution Use a clean cotton swab to apply a small amount of lidocaine  to canker sores ever 4 hours as needed.   meloxicam  (MOBIC ) 15 MG tablet Take 15 mg by mouth daily as needed.   nitroGLYCERIN  (NITROSTAT ) 0.4  MG SL tablet Place 1 tablet (0.4 mg total) under the tongue every 5 (five) minutes as needed for chest pain.   valsartan  (DIOVAN ) 160 MG tablet Take 1 tablet (160 mg total) by mouth daily.   [DISCONTINUED] metoprolol  tartrate (LOPRESSOR ) 100 MG tablet Take 1 tablet 2 hours prior to cardiac CT   "

## 2024-12-25 ENCOUNTER — Other Ambulatory Visit: Payer: Self-pay

## 2024-12-25 DIAGNOSIS — J441 Chronic obstructive pulmonary disease with (acute) exacerbation: Secondary | ICD-10-CM

## 2024-12-25 MED ORDER — IPRATROPIUM-ALBUTEROL 0.5-2.5 (3) MG/3ML IN SOLN: 3.0000 mL | RESPIRATORY_TRACT | 12 refills | Status: AC | PRN

## 2024-12-31 NOTE — Telephone Encounter (Signed)
 Spoke with the pt  I scheduled her with RB for acute visit tomorrow  Urged her to seek emergent care sooner if needed  Pt verbalized understanding

## 2025-01-01 ENCOUNTER — Ambulatory Visit: Admitting: Emergency Medicine

## 2025-01-01 ENCOUNTER — Ambulatory Visit

## 2025-01-01 ENCOUNTER — Encounter: Payer: Self-pay | Admitting: Emergency Medicine

## 2025-01-01 VITALS — BP 140/70 | HR 94 | Temp 99.0°F | Ht 65.0 in | Wt 120.0 lb

## 2025-01-01 DIAGNOSIS — R051 Acute cough: Secondary | ICD-10-CM

## 2025-01-01 DIAGNOSIS — J449 Chronic obstructive pulmonary disease, unspecified: Secondary | ICD-10-CM

## 2025-01-01 DIAGNOSIS — Z87891 Personal history of nicotine dependence: Secondary | ICD-10-CM | POA: Diagnosis not present

## 2025-01-01 DIAGNOSIS — J209 Acute bronchitis, unspecified: Secondary | ICD-10-CM

## 2025-01-01 MED ORDER — LEVOFLOXACIN 500 MG PO TABS: 500.0000 mg | ORAL_TABLET | Freq: Every day | ORAL | 0 refills | Status: DC

## 2025-01-01 MED ORDER — PREDNISONE 10 MG PO TABS: ORAL_TABLET | ORAL | 0 refills | Status: DC

## 2025-01-01 MED ORDER — BREZTRI AEROSPHERE 160-9-4.8 MCG/ACT IN AERO: 2.0000 | INHALATION_SPRAY | Freq: Two times a day (BID) | RESPIRATORY_TRACT | 11 refills | Status: AC

## 2025-01-01 NOTE — Assessment & Plan Note (Signed)
 With an acute exacerbation, question in the setting of a new URI (flu negative, COVID-negative) although she has had bronchitic symptoms now extending back to September.  Will check a chest x-ray now, treat her with prednisone , levofloxacin .

## 2025-01-01 NOTE — Patient Instructions (Addendum)
 Chest x-ray today We will plan to continue your Breztri  2 puffs twice a day.  Rinse and gargle after using.  Use this with a spacer. Stop your budesonide  and a formoterol  nebulizers (Pulmicort  and Brovana ) for now. Keep your albuterol /ipratropium (DuoNeb) nebulizer available to use 1 treatment up to every 6 hours if needed for shortness of breath, chest tightness, wheezing. Please take prednisone  as directed until completely gone. Please take levofloxacin  as directed until completely gone. Follow-up in our office in about 3 weeks so we can check on your progress.  Call or message sooner if you have any problems.

## 2025-01-01 NOTE — Progress Notes (Signed)
 "  Subjective:    Patient ID: Krista [name], female    DOB: 1951/05/14, 74 y.o.   MRN: [id number]  HPI  Acute office visit 01/01/2025 --  [name] is a 74 year old woman with COPD who has been followed by Dr. Donzetta.  She had a flare approximately 1 month ago.  Has been managed with Brovana /Pulmicort  and DuoNeb - about 3x a day.  She also has Breztri  - was using this until her nebs were refilled.  She feels that she is getting more benefit from the Breztri  (using a spacer) think she does from her standard nebs  She reports today that she began to have more cough over the weekend (about 4 days). Then URI sx, malaise, no fever. Her cough has progressed, increased dyspnea. Her sputum has been yellow. She has seen some blood in her nasal discharge, in her sputum.   Rapid COVID-19 and flu testing today is negative.  Review of Systems As per HPI  Past Medical History:  Diagnosis Date   Acute bronchitis with COPD (HCC) 05/10/2018   Allergy 1974   Angina pectoris 05/21/2024   Annual physical exam 02/28/2023   Anxiety 2021   Bilateral hearing loss 08/15/2017   Bilateral wrist pain 05/06/2020   Brain aneurysm 08/15/2017   S/p repair     Cardiac murmur 05/21/2024   Cervical radiculitis 10/31/2023   COPD (chronic obstructive pulmonary disease) (HCC)    per pt no issues, chest CT in epic 07-29-2021   Coronary artery calcification seen on CT scan 05/21/2024   DDD (degenerative disc disease), cervical    DDD (degenerative disc disease), lumbar    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    Depression    2021   Disorder of rotator cuff 03/07/2024   Emphysema of lung (HCC) 2012   Essential hypertension 09/05/2017   Facet arthritis of lumbar region 05/06/2020   Facial asymmetry, acquired 1974   left side paralysis   Facial paralysis 02/16/2022   Formatting of this note might be different from the original. Added automatically from request for  surgery [id number]     Former smoker 05/21/2024   GERD (gastroesophageal reflux disease) 2021   Has gotten worse this year.   Hemifacial spasm affecting both sides of face 02/16/2022   Formatting of this note might be different from the original. Added automatically from request for surgery [id number]     History of basal cell carcinoma (BCC) excision    History of cerebral aneurysm 2010   s/p coiling  ;  per pt was an incidental finding on imaging   History of Clostridioides difficile infection 2010   treated   History of COVID-19 05/08/2021   positive result in care everywhere; per pt mild to moderate symptoms that resolved   History of Helicobacter pylori infection 2010   treated   Hypercalcemia 02/28/2023   Hyperlipidemia    Hypertension 05/12/1973   followed by pcp   Impacted cerumen of left ear 03/07/2018   Inflammatory arthritis 03/13/2020   Lymphocytic colitis    followed by dr c. lindaann (GI);  dx by biopsy 09-13-2017   Macular degeneration of both eyes    Mixed hyperlipidemia 09/05/2017   Non-intractable vomiting with nausea    followed by dr lindaann   OA (osteoarthritis)    Osteoarthritis of left little finger 07/31/2018   Osteoporosis    Other spondylosis with radiculopathy, lumbar region 12/13/2022   Pneumonia    PONV (postoperative  nausea and vomiting)    Primary osteoarthritis of left hip 02/28/2023   Ptosis of right eyelid 02/16/2022   Formatting of this note might be different from the original. Added automatically from request for surgery [id number]     Spondylosis, unspecified    cervical and lumbar   Tympanosclerosis, left ear 03/07/2018   Unintentional weight loss 02/28/2023   Wears hearing aid in both ears      Family History  Problem Relation Age of Onset   Heart failure Mother    Arthritis Mother    Hearing loss Mother    Heart disease Mother    Hypertension Mother    Stroke Mother    Varicose Veins Mother    Cancer Father    Alcohol abuse Father     Early death Father    Hearing loss Father    Cancer Brother    Alcohol abuse Brother    ADD / ADHD Son    Alcohol abuse Son    Alcohol abuse Sister    Arthritis Sister    Alcohol abuse Brother    Cancer Brother    Alcohol abuse Daughter    Arthritis Sister    Cancer Sister    Heart disease Sister        Deceased   Arthritis Sister    Arthritis Brother    Hearing loss Brother    ADD / ADHD Son    Alcohol abuse Son    Cancer Brother    Vision loss Brother    Arthritis Sister    Cancer Sister    Heart disease Sister    Alcohol abuse Sister    Arthritis Sister    Vision loss Brother    Vision loss Brother    Alcohol abuse Brother    Alcohol abuse Daughter    Early death Sister      Social History   Socioeconomic History   Marital status: Married    Spouse name: Randal   Number of children: 2   Years of education: 14   Highest education level: Associate degree: academic program  Occupational History   Occupation: retired    Comment: control and instrumentation engineer  Tobacco Use   Smoking status: Former    Current packs/day: 0.00    Average packs/day: 0.8 packs/day for 52.5 years (42.0 ttl pk-yrs)    Types: Cigarettes    Start date: 10/06/1967    Quit date: 10/06/2011    Years since quitting: 13.2   Smokeless tobacco: Never   Tobacco comments:    Quit Date 10/06/2011  Vaping Use   Vaping status: Never Used  Substance and Sexual Activity   Alcohol use: Not Currently    Alcohol/week: 1.0 standard drink of alcohol    Comment: Rarely   Drug use: Never   Sexual activity: Not Currently    Birth control/protection: Abstinence, None  Other Topics Concern   Not on file  Social History Narrative   Lives with her husband. She enjoys playing with her grandson.   Social Drivers of Health   Tobacco Use: Medium Risk (01/01/2025)   Patient History    Smoking Tobacco Use: Former    Smokeless Tobacco Use: Never    Passive Exposure: Not on file  Financial Resource Strain: Low  Risk (09/30/2024)   Overall Financial Resource Strain (CARDIA)    Difficulty of Paying Living Expenses: Not very hard  Food Insecurity: No Food Insecurity (09/30/2024)   Epic    Worried About Radiation Protection Practitioner of [employer]  in the Last Year: Never true    Ran Out of Food in the Last Year: Never true  Transportation Needs: No Transportation Needs (11/10/2024)   Received from [facility]    In the past 12 months, has lack of transportation kept you from medical appointments or from getting medications?: No    In the past 12 months, has lack of transportation kept you from meetings, work, or from getting things needed for daily living?: No  Physical Activity: Insufficiently Active (09/30/2024)   Exercise Vital Sign    Days of Exercise per Week: 2 days    Minutes of Exercise per Session: 30 min  Stress: No Stress Concern Present (09/30/2024)   [employer] of Occupational Health - Occupational Stress Questionnaire    Feeling of Stress: Not at all  Social Connections: Socially Integrated (09/30/2024)   Social Connection and Isolation Panel    Frequency of Communication with Friends and Family: More than three times a week    Frequency of Social Gatherings with Friends and Family: Once a week    Attends Religious Services: More than 4 times per year    Active Member of [employer] or Organizations: Yes    Attends Banker Meetings: More than 4 times per year    Marital Status: Married  Catering Manager Violence: Not At Risk (11/10/2024)   Received from [facility]   HITS    Over the last 12 months how often did your partner physically hurt you?: Never    Over the last 12 months how often did your partner insult you or talk down to you?: Never    Over the last 12 months how often did your partner threaten you with physical harm?: Never    Over the last 12 months how often did your partner scream or curse at you?: Never  Depression (PHQ2-9): High Risk (10/17/2024)   Depression  (PHQ2-9)    PHQ-2 Score: 11  Alcohol Screen: Low Risk (07/04/2024)   Alcohol Screen    Last Alcohol Screening Score (AUDIT): 0  Housing: Low Risk (09/30/2024)   Epic    Unable to Pay for Housing in the Last Year: No    Number of Times Moved in the Last Year: 0    Homeless in the Last Year: No  Utilities: Not At Risk (02/07/2024)   AHC Utilities    Threatened with loss of utilities: No  Health Literacy: Adequate Health Literacy (02/07/2024)   B1300 Health Literacy    Frequency of need for help with medical instructions: Never     Allergies[1]   Outpatient Medications Prior to Visit  Medication Sig Dispense Refill   albuterol  (VENTOLIN  HFA) 108 (90 Base) MCG/ACT inhaler Inhale 2 puffs into the lungs every 6 (six) hours as needed for wheezing or shortness of breath. 8 g 0   arformoterol  (BROVANA ) 15 MCG/2ML NEBU Take 2 mLs (15 mcg total) by nebulization 2 (two) times daily. 120 mL 6   aspirin  EC 81 MG tablet Take 1 tablet (81 mg total) by mouth daily. Swallow whole.     atorvastatin  (LIPITOR) 40 MG tablet Take 1 tablet (40 mg total) by mouth daily. 90 tablet 3   benzonatate  (TESSALON ) 200 MG capsule Take 1 capsule (200 mg total) by mouth 3 (three) times daily as needed for cough. 45 capsule 0   budesonide  (PULMICORT ) 0.25 MG/2ML nebulizer solution Take 2 mLs (0.25 mg total) by nebulization daily. 60 mL 12   budesonide -glycopyrrolate-formoterol  (BREZTRI  AEROSPHERE)  160-9-4.8 MCG/ACT AERO inhaler Inhale 2 puffs into the lungs in the morning and at bedtime.     cholecalciferol (VITAMIN D3) 25 MCG (1000 UNIT) tablet Take 1,000 Units by mouth every evening.     cyclobenzaprine  (FLEXERIL ) 10 MG tablet Take 1 tablet (10 mg total) by mouth 2 (two) times daily at 8 am and 10 pm. 120 tablet 5   ipratropium-albuterol  (DUONEB) 0.5-2.5 (3) MG/3ML SOLN Take 3 mLs by nebulization every 4 (four) hours as needed. 360 mL 12   meloxicam  (MOBIC ) 15 MG tablet Take 15 mg by mouth daily as needed.      nitroGLYCERIN  (NITROSTAT ) 0.4 MG SL tablet Place 1 tablet (0.4 mg total) under the tongue every 5 (five) minutes as needed for chest pain. 25 tablet 2   SODIUM FLUORIDE 5000 PPM 1.1 % GEL dental gel SMARTSIG:sparingly Every Night     Spacer/Aero-Holding Chambers (OPTICHAMBER DIAMOND) MISC SMARTSIG:1 Each Once     valsartan  (DIOVAN ) 160 MG tablet Take 1 tablet (160 mg total) by mouth daily. 90 tablet 3   lidocaine  (XYLOCAINE ) 2 % solution Use a clean cotton swab to apply a small amount of lidocaine  to canker sores ever 4 hours as needed. (Patient not taking: Reported on 01/01/2025) 100 mL 0   No facility-administered medications prior to visit.        Objective:   Physical Exam Vitals:   01/01/25 1405  BP: (!) 140/70  Pulse: 94  Temp: 99 F (37.2 C)  TempSrc: Oral  SpO2: 99%  Weight: 120 lb (54.4 kg)  Height: 5' 5 (1.651 m)   Gen: Pleasant, well-nourished, in no distress,  normal affect  ENT: No lesions,  mouth clear,  oropharynx clear, no postnasal drip  Neck: No JVD, no stridor  Lungs: No use of accessory muscles, scattered bilateral rhonchi, distant, no wheezing  Cardiovascular: RRR, heart sounds normal, no murmur or gallops, no peripheral edema  Musculoskeletal: No deformities, no cyanosis or clubbing  Neuro: alert, awake, non focal  Skin: Warm, no lesions or rash      Assessment & Plan:   COPD (chronic obstructive pulmonary disease) (HCC) She started the Breztri  that she was given by Dr. Zaida, has been using it with a spacer.  Prefers it to the scheduled nebulizer medications.  We will try stopping the budesonide /a formoterol , use the Breztri .  She can report back as to whether she would like to get back on the nebulized schedule medication.  She does have DuoNeb available to use if needed.  Acute bronchitis with COPD (HCC) With an acute exacerbation, question in the setting of a new URI (flu negative, COVID-negative) although she has had bronchitic symptoms now  extending back to September.  Will check a chest x-ray now, treat her with prednisone , levofloxacin .    I personally spent a total of 32 minutes in the care of the patient today including preparing to see the patient, getting/reviewing separately obtained history, performing a medically appropriate exam/evaluation, counseling and educating, placing orders, referring and communicating with other health care professionals, independently interpreting results, and communicating results.   Lamar Chris, MD, PhD 01/01/2025, 2:46 PM [facility] [phone] or if no answer before 7:00PM call [phone] For any issues after 7:00PM please call eLink [phone]     [1]  Allergies Allergen Reactions   Tomato Hives   Codeine Other (See Comments)    Hyperactivity wired super hyperactivity      Colestipol Swelling    Lips swollen/NO resp  issues   Kiwi Extract Hives   Other Hives, Rash and Other (See Comments)    Nectarines--tongue swells    Plum Pulp Hives   Tape Hives and Rash   Doxycycline  Hyclate Other (See Comments)    Abdominal pain and diarrhea   "

## 2025-01-01 NOTE — Assessment & Plan Note (Signed)
 She started the Breztri  that she was given by Dr. Zaida, has been using it with a spacer.  Prefers it to the scheduled nebulizer medications.  We will try stopping the budesonide /a formoterol , use the Breztri .  She can report back as to whether she would like to get back on the nebulized schedule medication.  She does have DuoNeb available to use if needed.

## 2025-01-07 ENCOUNTER — Encounter

## 2025-01-08 NOTE — Telephone Encounter (Signed)
 If no better then she needs to be seen as an acute OV by APP or Dr Zaida if available. Thanks.

## 2025-01-08 NOTE — Telephone Encounter (Signed)
 Please advise patient still not feeling well after antibiotic and prednisone 

## 2025-01-09 ENCOUNTER — Encounter: Payer: Self-pay | Admitting: Emergency Medicine

## 2025-01-09 ENCOUNTER — Ambulatory Visit: Admitting: Emergency Medicine

## 2025-01-09 VITALS — BP 116/58 | HR 88 | Ht 65.0 in | Wt 119.0 lb

## 2025-01-09 DIAGNOSIS — Z87891 Personal history of nicotine dependence: Secondary | ICD-10-CM | POA: Diagnosis not present

## 2025-01-09 DIAGNOSIS — R059 Cough, unspecified: Secondary | ICD-10-CM | POA: Diagnosis not present

## 2025-01-09 DIAGNOSIS — R053 Chronic cough: Secondary | ICD-10-CM

## 2025-01-09 DIAGNOSIS — J449 Chronic obstructive pulmonary disease, unspecified: Secondary | ICD-10-CM

## 2025-01-09 DIAGNOSIS — J479 Bronchiectasis, uncomplicated: Secondary | ICD-10-CM

## 2025-01-09 MED ORDER — FLUTICASONE PROPIONATE 50 MCG/ACT NA SUSP: 2.0000 | Freq: Every day | NASAL | 2 refills | Status: AC

## 2025-01-09 NOTE — Patient Instructions (Addendum)
 Please continue your nebulized medication: Aformoterol (Brovana ) twice a day and budesonide  (Pulmicort ) twice a day. Okay to use your DuoNeb (ipratropium/albuterol ) as needed for shortness of breath, spells of coughing Stop the Breztri  for now We will perform sputum cultures Please try starting fluticasone  nasal spray, 2 sprays each nostril once daily Please try starting loratadine 10 mg (generic Claritin) once daily. Get your CT scan of the chest later this month as planned Follow-up in our office 02/03/2025 as planned. Follow Dr. Zaida as planned.

## 2025-01-09 NOTE — Progress Notes (Signed)
 "  Subjective:    Patient ID: Krista [name], female    DOB: 13-Mar-1951, 74 y.o.   MRN: [id number]  HPI  Acute office visit 01/01/2025 --  [name] is a 74 year old woman with COPD who has been followed by Dr. Donzetta.  She had a flare approximately 1 month ago.  Has been managed with Brovana /Pulmicort  and DuoNeb - about 3x a day.  She also has Breztri  - was using this until her nebs were refilled.  She feels that she is getting more benefit from the Breztri  (using a spacer) than she does from her standard nebs  She reports today that she began to have more cough over the weekend (about 4 days). Then URI sx, malaise, no fever. Her cough has progressed, increased dyspnea. Her sputum has been yellow. She has seen some blood in her nasal discharge, in her sputum.   Rapid COVID-19 and flu testing today is negative.  ROV 01/09/2025 --74 year old woman with COPD who has been dealing with intrusive dyspnea, cough going back for just over a month..  I saw her 01/01/2025 when she was having persistent symptoms and treated her with another round of antibiotics (levofloxacin ) and prednisone .  We continued Breztri  with a spacer because she preferred it to her nebulized regimen.  Chest x-ray at that visit did not show any evidence of infiltrate, pneumonia.  She continues to have nasal congestion, greenish. Coughing, minimally productive but can see some colored mucous. She did Breztri  on a schedule for 2 days but didn't feel any different so she went back to nebulized regimen > Brovana  + Pulmicort . She is not on a rhinitis regimen. She describes some GERD. She is scheduled for a repeat CT chest later this month to follow nodules.     Review of Systems As per HPI  Past Medical History:  Diagnosis Date   Acute bronchitis with COPD (HCC) 05/10/2018   Allergy 1974   Angina pectoris 05/21/2024   Annual physical exam 02/28/2023   Anxiety 2021   Bilateral hearing loss 08/15/2017   Bilateral wrist pain  05/06/2020   Brain aneurysm 08/15/2017   S/p repair     Cardiac murmur 05/21/2024   Cervical radiculitis 10/31/2023   COPD (chronic obstructive pulmonary disease) (HCC)    per pt no issues, chest CT in epic 07-29-2021   Coronary artery calcification seen on CT scan 05/21/2024   DDD (degenerative disc disease), cervical    DDD (degenerative disc disease), lumbar    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    Depression    2021   Disorder of rotator cuff 03/07/2024   Emphysema of lung (HCC) 2012   Essential hypertension 09/05/2017   Facet arthritis of lumbar region 05/06/2020   Facial asymmetry, acquired 1974   left side paralysis   Facial paralysis 02/16/2022   Formatting of this note might be different from the original. Added automatically from request for surgery [id number]     Former smoker 05/21/2024   GERD (gastroesophageal reflux disease) 2021   Has gotten worse this year.   Hemifacial spasm affecting both sides of face 02/16/2022   Formatting of this note might be different from the original. Added automatically from request for surgery [id number]     History of basal cell carcinoma (BCC) excision    History of cerebral aneurysm 2010   s/p coiling  ;  per pt was an incidental finding on imaging   History of Clostridioides difficile infection 2010  treated   History of COVID-19 05/08/2021   positive result in care everywhere; per pt mild to moderate symptoms that resolved   History of Helicobacter pylori infection 2010   treated   Hypercalcemia 02/28/2023   Hyperlipidemia    Hypertension 05/12/1973   followed by pcp   Impacted cerumen of left ear 03/07/2018   Inflammatory arthritis 03/13/2020   Lymphocytic colitis    followed by dr c. lindaann (GI);  dx by biopsy 09-13-2017   Macular degeneration of both eyes    Mixed hyperlipidemia 09/05/2017   Non-intractable vomiting with nausea    followed by dr lindaann   OA  (osteoarthritis)    Osteoarthritis of left little finger 07/31/2018   Osteoporosis    Other spondylosis with radiculopathy, lumbar region 12/13/2022   Pneumonia    PONV (postoperative nausea and vomiting)    Primary osteoarthritis of left hip 02/28/2023   Ptosis of right eyelid 02/16/2022   Formatting of this note might be different from the original. Added automatically from request for surgery [id number]     Spondylosis, unspecified    cervical and lumbar   Tympanosclerosis, left ear 03/07/2018   Unintentional weight loss 02/28/2023   Wears hearing aid in both ears      Family History  Problem Relation Age of Onset   Heart failure Mother    Arthritis Mother    Hearing loss Mother    Heart disease Mother    Hypertension Mother    Stroke Mother    Varicose Veins Mother    Cancer Father    Alcohol abuse Father    Early death Father    Hearing loss Father    Cancer Brother    Alcohol abuse Brother    ADD / ADHD Son    Alcohol abuse Son    Alcohol abuse Sister    Arthritis Sister    Alcohol abuse Brother    Cancer Brother    Alcohol abuse Daughter    Arthritis Sister    Cancer Sister    Heart disease Sister        Deceased   Arthritis Sister    Arthritis Brother    Hearing loss Brother    ADD / ADHD Son    Alcohol abuse Son    Cancer Brother    Vision loss Brother    Arthritis Sister    Cancer Sister    Heart disease Sister    Alcohol abuse Sister    Arthritis Sister    Vision loss Brother    Vision loss Brother    Alcohol abuse Brother    Alcohol abuse Daughter    Early death Sister      Social History   Socioeconomic History   Marital status: Married    Spouse name: Randal   Number of children: 2   Years of education: 14   Highest education level: Associate degree: academic program  Occupational History   Occupation: retired    Comment: control and instrumentation engineer  Tobacco Use   Smoking status: Former    Current packs/day: 0.00    Average packs/day: 0.8  packs/day for 52.5 years (42.0 ttl pk-yrs)    Types: Cigarettes    Start date: 10/06/1967    Quit date: 10/06/2011    Years since quitting: 13.2   Smokeless tobacco: Never   Tobacco comments:    Quit Date 10/06/2011  Vaping Use   Vaping status: Never Used  Substance and Sexual Activity   Alcohol use: Not  Currently    Alcohol/week: 1.0 standard drink of alcohol    Comment: Rarely   Drug use: Never   Sexual activity: Not Currently    Birth control/protection: Abstinence, None  Other Topics Concern   Not on file  Social History Narrative   Lives with her husband. She enjoys playing with her grandson.   Social Drivers of Health   Tobacco Use: Medium Risk (01/09/2025)   Patient History    Smoking Tobacco Use: Former    Smokeless Tobacco Use: Never    Passive Exposure: Not on file  Financial Resource Strain: Low Risk (09/30/2024)   Overall Financial Resource Strain (CARDIA)    Difficulty of Paying Living Expenses: Not very hard  Food Insecurity: No Food Insecurity (09/30/2024)   Epic    Worried About Programme Researcher, Broadcasting/film/video in the Last Year: Never true    Ran Out of Food in the Last Year: Never true  Transportation Needs: No Transportation Needs (11/10/2024)   Received from [facility]   Epic    In the past 12 months, has lack of transportation kept you from medical appointments or from getting medications?: No    In the past 12 months, has lack of transportation kept you from meetings, work, or from getting things needed for daily living?: No  Physical Activity: Insufficiently Active (09/30/2024)   Exercise Vital Sign    Days of Exercise per Week: 2 days    Minutes of Exercise per Session: 30 min  Stress: No Stress Concern Present (09/30/2024)   [employer] of Occupational Health - Occupational Stress Questionnaire    Feeling of Stress: Not at all  Social Connections: Socially Integrated (09/30/2024)   Social Connection and Isolation Panel    Frequency of Communication  with Friends and Family: More than three times a week    Frequency of Social Gatherings with Friends and Family: Once a week    Attends Religious Services: More than 4 times per year    Active Member of [employer] or Organizations: Yes    Attends Banker Meetings: More than 4 times per year    Marital Status: Married  Catering Manager Violence: Not At Risk (11/10/2024)   Received from [facility]   HITS    Over the last 12 months how often did your partner physically hurt you?: Never    Over the last 12 months how often did your partner insult you or talk down to you?: Never    Over the last 12 months how often did your partner threaten you with physical harm?: Never    Over the last 12 months how often did your partner scream or curse at you?: Never  Depression (PHQ2-9): High Risk (10/17/2024)   Depression (PHQ2-9)    PHQ-2 Score: 11  Alcohol Screen: Low Risk (07/04/2024)   Alcohol Screen    Last Alcohol Screening Score (AUDIT): 0  Housing: Low Risk (09/30/2024)   Epic    Unable to Pay for Housing in the Last Year: No    Number of Times Moved in the Last Year: 0    Homeless in the Last Year: No  Utilities: Not At Risk (02/07/2024)   AHC Utilities    Threatened with loss of utilities: No  Health Literacy: Adequate Health Literacy (02/07/2024)   B1300 Health Literacy    Frequency of need for help with medical instructions: Never     Allergies[1]   Outpatient Medications Prior to Visit  Medication Sig Dispense Refill  albuterol  (VENTOLIN  HFA) 108 (90 Base) MCG/ACT inhaler Inhale 2 puffs into the lungs every 6 (six) hours as needed for wheezing or shortness of breath. 8 g 0   arformoterol  (BROVANA ) 15 MCG/2ML NEBU Take 2 mLs (15 mcg total) by nebulization 2 (two) times daily. 120 mL 6   aspirin  EC 81 MG tablet Take 1 tablet (81 mg total) by mouth daily. Swallow whole.     atorvastatin  (LIPITOR) 40 MG tablet Take 1 tablet (40 mg total) by mouth daily. 90 tablet 3    benzonatate  (TESSALON ) 200 MG capsule Take 1 capsule (200 mg total) by mouth 3 (three) times daily as needed for cough. 45 capsule 0   budesonide  (PULMICORT ) 0.25 MG/2ML nebulizer solution Take 2 mLs (0.25 mg total) by nebulization daily. 60 mL 12   budesonide -glycopyrrolate-formoterol  (BREZTRI  AEROSPHERE) 160-9-4.8 MCG/ACT AERO inhaler Inhale 2 puffs into the lungs in the morning and at bedtime. 10.7 g 11   cholecalciferol (VITAMIN D3) 25 MCG (1000 UNIT) tablet Take 1,000 Units by mouth every evening.     cyclobenzaprine  (FLEXERIL ) 10 MG tablet Take 1 tablet (10 mg total) by mouth 2 (two) times daily at 8 am and 10 pm. 120 tablet 5   ipratropium-albuterol  (DUONEB) 0.5-2.5 (3) MG/3ML SOLN Take 3 mLs by nebulization every 4 (four) hours as needed. 360 mL 12   lidocaine  (XYLOCAINE ) 2 % solution Use a clean cotton swab to apply a small amount of lidocaine  to canker sores ever 4 hours as needed. 100 mL 0   meloxicam  (MOBIC ) 15 MG tablet Take 15 mg by mouth daily as needed.     nitroGLYCERIN  (NITROSTAT ) 0.4 MG SL tablet Place 1 tablet (0.4 mg total) under the tongue every 5 (five) minutes as needed for chest pain. 25 tablet 2   SODIUM FLUORIDE 5000 PPM 1.1 % GEL dental gel SMARTSIG:sparingly Every Night     Spacer/Aero-Holding Chambers (OPTICHAMBER DIAMOND) MISC SMARTSIG:1 Each Once     valsartan  (DIOVAN ) 160 MG tablet Take 1 tablet (160 mg total) by mouth daily. 90 tablet 3   levofloxacin  (LEVAQUIN ) 500 MG tablet Take 1 tablet (500 mg total) by mouth daily. 7 tablet 0   predniSONE  (DELTASONE ) 10 MG tablet Take 30mg  daily for 3 days, then 20mg  daily for 3 days, then 10mg  daily for 3 days, then stop 18 tablet 0   No facility-administered medications prior to visit.        Objective:   Physical Exam Vitals:   01/09/25 1334  BP: (!) 116/58  Pulse: 88  SpO2: 96%  Weight: 119 lb (54 kg)  Height: 5' 5 (1.651 m)   Gen: Pleasant, well-nourished, in no distress,  normal affect  ENT: No lesions,   mouth clear,  oropharynx clear, no postnasal drip  Neck: No JVD, no stridor  Lungs: No use of accessory muscles, scattered bilateral rhonchi, distant, no wheezing  Cardiovascular: RRR, heart sounds normal, no murmur or gallops, no peripheral edema  Musculoskeletal: No deformities, no cyanosis or clubbing  Neuro: alert, awake, non focal  Skin: Warm, no lesions or rash      Assessment & Plan:   Cough Persistent cough after being treated on multiple occasions for acute flares.  She did have some bronchiectasis on her recent CT chest, consider a factor here.  I did treat her a week ago with levofloxacin  without any significant impact.  Her repeat CT chest to follow her pulmonary nodules is later this month and this should give us  a look at  her bronchiectasis, evaluate for any possible opportunistic infection.  I will send sputum cultures for AFB and bacterial pathogen's today.  In the meantime try treating for rhinitis which she is having and which could certainly dry persistent cough.   We will perform sputum cultures Please try starting fluticasone  nasal spray, 2 sprays each nostril once daily Please try starting loratadine 10 mg (generic Claritin) once daily. Get your CT scan of the chest later this month as planned Follow-up in our office 02/03/2025 as planned. Follow Dr. Zaida as planned.  COPD (chronic obstructive pulmonary disease) (HCC) She is getting more benefit from the nebulized regimen then from the Breztri .  We will continue it for now.  Please continue your nebulized medication: Aformoterol (Brovana ) twice a day and budesonide  (Pulmicort ) twice a day. Okay to use your DuoNeb (ipratropium/albuterol ) as needed for shortness of breath, spells of coughing Stop the Breztri  for now   I personally spent a total of 24 minutes in the care of the patient today including preparing to see the patient, getting/reviewing separately obtained history, performing a medically appropriate  exam/evaluation, counseling and educating, placing orders, referring and communicating with other health care professionals, independently interpreting results, and communicating results.   Lamar Chris, MD, PhD 01/09/2025, 2:00 PM [facility] [phone] or if no answer before 7:00PM call [phone] For any issues after 7:00PM please call eLink [phone]      [1]  Allergies Allergen Reactions   Tomato Hives   Codeine Other (See Comments)    Hyperactivity wired super hyperactivity      Colestipol Swelling    Lips swollen/NO resp issues   Kiwi Extract Hives   Other Hives, Rash and Other (See Comments)    Nectarines--tongue swells    Plum Pulp Hives   Tape Hives and Rash   Doxycycline  Hyclate Other (See Comments)    Abdominal pain and diarrhea   "

## 2025-01-09 NOTE — Assessment & Plan Note (Signed)
 Persistent cough after being treated on multiple occasions for acute flares.  She did have some bronchiectasis on her recent CT chest, consider a factor here.  I did treat her a week ago with levofloxacin  without any significant impact.  Her repeat CT chest to follow her pulmonary nodules is later this month and this should give us  a look at her bronchiectasis, evaluate for any possible opportunistic infection.  I will send sputum cultures for AFB and bacterial pathogen's today.  In the meantime try treating for rhinitis which she is having and which could certainly dry persistent cough.   We will perform sputum cultures Please try starting fluticasone  nasal spray, 2 sprays each nostril once daily Please try starting loratadine 10 mg (generic Claritin) once daily. Get your CT scan of the chest later this month as planned Follow-up in our office 02/03/2025 as planned. Follow Dr. Zaida as planned.

## 2025-01-09 NOTE — Assessment & Plan Note (Signed)
 She is getting more benefit from the nebulized regimen then from the Breztri .  We will continue it for now.  Please continue your nebulized medication: Aformoterol (Brovana ) twice a day and budesonide  (Pulmicort ) twice a day. Okay to use your DuoNeb (ipratropium/albuterol ) as needed for shortness of breath, spells of coughing Stop the Breztri  for now

## 2025-01-21 ENCOUNTER — Encounter

## 2025-02-03 ENCOUNTER — Ambulatory Visit: Admitting: Primary Care

## 2025-02-11 ENCOUNTER — Ambulatory Visit

## 2025-05-19 ENCOUNTER — Ambulatory Visit
